# Patient Record
Sex: Female | Born: 1970
Health system: Southern US, Community
[De-identification: ages and names within clinical notes are randomized; demographics above are authoritative.]

## PROBLEM LIST (undated history)

## (undated) DIAGNOSIS — N200 Calculus of kidney: Secondary | ICD-10-CM

## (undated) DIAGNOSIS — I10 Essential (primary) hypertension: Secondary | ICD-10-CM

## (undated) DIAGNOSIS — N289 Disorder of kidney and ureter, unspecified: Secondary | ICD-10-CM

## (undated) DIAGNOSIS — K649 Unspecified hemorrhoids: Secondary | ICD-10-CM

## (undated) HISTORY — PX: TUBAL LIGATION: SHX77

## (undated) HISTORY — PX: CHOLECYSTECTOMY: SHX55

## (undated) HISTORY — DX: Unspecified hemorrhoids: K64.9

## (undated) HISTORY — PX: OTHER SURGICAL HISTORY: SHX169

---

## 1998-01-16 ENCOUNTER — Emergency Department (HOSPITAL_COMMUNITY): Admission: EM | Admit: 1998-01-16 | Discharge: 1998-01-16 | Payer: Self-pay | Admitting: Emergency Medicine

## 1998-01-28 ENCOUNTER — Emergency Department (HOSPITAL_COMMUNITY): Admission: EM | Admit: 1998-01-28 | Discharge: 1998-01-28 | Payer: Self-pay | Admitting: Emergency Medicine

## 1999-04-19 ENCOUNTER — Emergency Department (HOSPITAL_COMMUNITY): Admission: EM | Admit: 1999-04-19 | Discharge: 1999-04-19 | Payer: Self-pay | Admitting: Emergency Medicine

## 1999-06-27 ENCOUNTER — Encounter: Admission: RE | Admit: 1999-06-27 | Discharge: 1999-06-27 | Payer: Self-pay | Admitting: Family Medicine

## 1999-07-10 ENCOUNTER — Emergency Department (HOSPITAL_COMMUNITY): Admission: EM | Admit: 1999-07-10 | Discharge: 1999-07-10 | Payer: Self-pay | Admitting: Emergency Medicine

## 1999-07-12 ENCOUNTER — Emergency Department (HOSPITAL_COMMUNITY): Admission: EM | Admit: 1999-07-12 | Discharge: 1999-07-12 | Payer: Self-pay | Admitting: Emergency Medicine

## 1999-07-13 ENCOUNTER — Encounter: Admission: RE | Admit: 1999-07-13 | Discharge: 1999-07-13 | Payer: Self-pay | Admitting: Family Medicine

## 1999-07-14 ENCOUNTER — Emergency Department (HOSPITAL_COMMUNITY): Admission: EM | Admit: 1999-07-14 | Discharge: 1999-07-14 | Payer: Self-pay | Admitting: Emergency Medicine

## 2000-01-02 ENCOUNTER — Emergency Department (HOSPITAL_COMMUNITY): Admission: EM | Admit: 2000-01-02 | Discharge: 2000-01-03 | Payer: Self-pay | Admitting: Emergency Medicine

## 2000-01-23 ENCOUNTER — Encounter: Admission: RE | Admit: 2000-01-23 | Discharge: 2000-01-23 | Payer: Self-pay | Admitting: Family Medicine

## 2000-03-24 ENCOUNTER — Emergency Department (HOSPITAL_COMMUNITY): Admission: EM | Admit: 2000-03-24 | Discharge: 2000-03-24 | Payer: Self-pay | Admitting: Internal Medicine

## 2000-03-25 ENCOUNTER — Encounter: Admission: RE | Admit: 2000-03-25 | Discharge: 2000-03-25 | Payer: Self-pay | Admitting: Family Medicine

## 2000-06-16 ENCOUNTER — Encounter: Admission: RE | Admit: 2000-06-16 | Discharge: 2000-06-16 | Payer: Self-pay | Admitting: Family Medicine

## 2000-07-31 ENCOUNTER — Encounter: Admission: RE | Admit: 2000-07-31 | Discharge: 2000-07-31 | Payer: Self-pay | Admitting: Family Medicine

## 2000-10-15 ENCOUNTER — Encounter: Admission: RE | Admit: 2000-10-15 | Discharge: 2000-10-15 | Payer: Self-pay | Admitting: Family Medicine

## 2000-11-22 ENCOUNTER — Emergency Department (HOSPITAL_COMMUNITY): Admission: EM | Admit: 2000-11-22 | Discharge: 2000-11-22 | Payer: Self-pay | Admitting: Emergency Medicine

## 2001-01-07 ENCOUNTER — Emergency Department (HOSPITAL_COMMUNITY): Admission: EM | Admit: 2001-01-07 | Discharge: 2001-01-07 | Payer: Self-pay | Admitting: Emergency Medicine

## 2001-01-07 ENCOUNTER — Encounter: Payer: Self-pay | Admitting: Emergency Medicine

## 2001-01-12 ENCOUNTER — Emergency Department (HOSPITAL_COMMUNITY): Admission: EM | Admit: 2001-01-12 | Discharge: 2001-01-12 | Payer: Self-pay | Admitting: Emergency Medicine

## 2001-01-12 ENCOUNTER — Encounter: Payer: Self-pay | Admitting: Emergency Medicine

## 2001-02-17 ENCOUNTER — Encounter: Admission: RE | Admit: 2001-02-17 | Discharge: 2001-02-17 | Payer: Self-pay | Admitting: Family Medicine

## 2001-03-18 ENCOUNTER — Emergency Department (HOSPITAL_COMMUNITY): Admission: EM | Admit: 2001-03-18 | Discharge: 2001-03-18 | Payer: Self-pay | Admitting: Emergency Medicine

## 2001-04-06 ENCOUNTER — Encounter: Admission: RE | Admit: 2001-04-06 | Discharge: 2001-04-06 | Payer: Self-pay | Admitting: Family Medicine

## 2001-05-17 ENCOUNTER — Emergency Department (HOSPITAL_COMMUNITY): Admission: EM | Admit: 2001-05-17 | Discharge: 2001-05-17 | Payer: Self-pay | Admitting: Emergency Medicine

## 2001-05-24 ENCOUNTER — Emergency Department (HOSPITAL_COMMUNITY): Admission: EM | Admit: 2001-05-24 | Discharge: 2001-05-24 | Payer: Self-pay | Admitting: Emergency Medicine

## 2001-05-28 ENCOUNTER — Encounter: Admission: RE | Admit: 2001-05-28 | Discharge: 2001-05-28 | Payer: Self-pay | Admitting: Family Medicine

## 2001-07-02 ENCOUNTER — Other Ambulatory Visit: Admission: RE | Admit: 2001-07-02 | Discharge: 2001-07-02 | Payer: Self-pay | Admitting: Obstetrics and Gynecology

## 2001-09-10 ENCOUNTER — Ambulatory Visit (HOSPITAL_COMMUNITY): Admission: RE | Admit: 2001-09-10 | Discharge: 2001-09-10 | Payer: Self-pay | Admitting: Obstetrics and Gynecology

## 2001-09-10 ENCOUNTER — Encounter: Payer: Self-pay | Admitting: Obstetrics and Gynecology

## 2001-09-17 ENCOUNTER — Encounter: Payer: Self-pay | Admitting: Obstetrics and Gynecology

## 2001-09-17 ENCOUNTER — Ambulatory Visit (HOSPITAL_COMMUNITY): Admission: RE | Admit: 2001-09-17 | Discharge: 2001-09-17 | Payer: Self-pay | Admitting: Obstetrics and Gynecology

## 2001-10-22 ENCOUNTER — Ambulatory Visit (HOSPITAL_COMMUNITY): Admission: RE | Admit: 2001-10-22 | Discharge: 2001-10-22 | Payer: Self-pay | Admitting: Obstetrics and Gynecology

## 2001-10-22 ENCOUNTER — Encounter: Payer: Self-pay | Admitting: Obstetrics and Gynecology

## 2001-12-01 ENCOUNTER — Inpatient Hospital Stay (HOSPITAL_COMMUNITY): Admission: AD | Admit: 2001-12-01 | Discharge: 2001-12-01 | Payer: Self-pay | Admitting: Obstetrics and Gynecology

## 2001-12-02 ENCOUNTER — Observation Stay (HOSPITAL_COMMUNITY): Admission: AD | Admit: 2001-12-02 | Discharge: 2001-12-03 | Payer: Self-pay | Admitting: Obstetrics and Gynecology

## 2001-12-15 ENCOUNTER — Inpatient Hospital Stay (HOSPITAL_COMMUNITY): Admission: AD | Admit: 2001-12-15 | Discharge: 2001-12-15 | Payer: Self-pay | Admitting: Obstetrics and Gynecology

## 2002-01-08 ENCOUNTER — Inpatient Hospital Stay (HOSPITAL_COMMUNITY): Admission: AD | Admit: 2002-01-08 | Discharge: 2002-01-08 | Payer: Self-pay | Admitting: Obstetrics and Gynecology

## 2002-01-14 ENCOUNTER — Inpatient Hospital Stay (HOSPITAL_COMMUNITY): Admission: AD | Admit: 2002-01-14 | Discharge: 2002-01-16 | Payer: Self-pay | Admitting: Obstetrics and Gynecology

## 2002-01-14 ENCOUNTER — Encounter (INDEPENDENT_AMBULATORY_CARE_PROVIDER_SITE_OTHER): Payer: Self-pay

## 2002-01-20 ENCOUNTER — Inpatient Hospital Stay (HOSPITAL_COMMUNITY): Admission: AD | Admit: 2002-01-20 | Discharge: 2002-01-21 | Payer: Self-pay | Admitting: Obstetrics and Gynecology

## 2002-04-24 ENCOUNTER — Emergency Department (HOSPITAL_COMMUNITY): Admission: EM | Admit: 2002-04-24 | Discharge: 2002-04-24 | Payer: Self-pay | Admitting: Emergency Medicine

## 2002-10-27 ENCOUNTER — Emergency Department (HOSPITAL_COMMUNITY): Admission: EM | Admit: 2002-10-27 | Discharge: 2002-10-27 | Payer: Self-pay | Admitting: Emergency Medicine

## 2002-12-10 ENCOUNTER — Emergency Department (HOSPITAL_COMMUNITY): Admission: AD | Admit: 2002-12-10 | Discharge: 2002-12-10 | Payer: Self-pay | Admitting: Family Medicine

## 2003-04-28 ENCOUNTER — Emergency Department (HOSPITAL_COMMUNITY): Admission: EM | Admit: 2003-04-28 | Discharge: 2003-04-28 | Payer: Self-pay | Admitting: Family Medicine

## 2003-09-30 ENCOUNTER — Emergency Department (HOSPITAL_COMMUNITY): Admission: EM | Admit: 2003-09-30 | Discharge: 2003-09-30 | Payer: Self-pay | Admitting: Emergency Medicine

## 2003-10-23 ENCOUNTER — Inpatient Hospital Stay (HOSPITAL_COMMUNITY): Admission: AD | Admit: 2003-10-23 | Discharge: 2003-10-24 | Payer: Self-pay | Admitting: Obstetrics and Gynecology

## 2003-11-29 ENCOUNTER — Emergency Department (HOSPITAL_COMMUNITY): Admission: EM | Admit: 2003-11-29 | Discharge: 2003-11-29 | Payer: Self-pay | Admitting: Family Medicine

## 2004-02-21 ENCOUNTER — Emergency Department (HOSPITAL_COMMUNITY): Admission: EM | Admit: 2004-02-21 | Discharge: 2004-02-21 | Payer: Self-pay | Admitting: Emergency Medicine

## 2004-02-22 ENCOUNTER — Emergency Department (HOSPITAL_COMMUNITY): Admission: EM | Admit: 2004-02-22 | Discharge: 2004-02-22 | Payer: Self-pay | Admitting: Family Medicine

## 2004-02-23 ENCOUNTER — Emergency Department (HOSPITAL_COMMUNITY): Admission: EM | Admit: 2004-02-23 | Discharge: 2004-02-24 | Payer: Self-pay | Admitting: Emergency Medicine

## 2004-03-14 ENCOUNTER — Ambulatory Visit: Payer: Self-pay | Admitting: Family Medicine

## 2004-12-07 ENCOUNTER — Emergency Department (HOSPITAL_COMMUNITY): Admission: EM | Admit: 2004-12-07 | Discharge: 2004-12-07 | Payer: Self-pay | Admitting: Family Medicine

## 2005-01-11 ENCOUNTER — Emergency Department (HOSPITAL_COMMUNITY): Admission: EM | Admit: 2005-01-11 | Discharge: 2005-01-11 | Payer: Self-pay | Admitting: Family Medicine

## 2005-01-15 ENCOUNTER — Ambulatory Visit: Payer: Self-pay | Admitting: Sports Medicine

## 2005-01-26 ENCOUNTER — Emergency Department (HOSPITAL_COMMUNITY): Admission: EM | Admit: 2005-01-26 | Discharge: 2005-01-26 | Payer: Self-pay | Admitting: Family Medicine

## 2005-02-19 ENCOUNTER — Ambulatory Visit: Payer: Self-pay | Admitting: Sports Medicine

## 2005-04-22 ENCOUNTER — Emergency Department (HOSPITAL_COMMUNITY): Admission: EM | Admit: 2005-04-22 | Discharge: 2005-04-22 | Payer: Self-pay | Admitting: Emergency Medicine

## 2005-10-29 ENCOUNTER — Ambulatory Visit: Payer: Self-pay | Admitting: Family Medicine

## 2006-03-06 DIAGNOSIS — E669 Obesity, unspecified: Secondary | ICD-10-CM | POA: Insufficient documentation

## 2006-04-21 ENCOUNTER — Telehealth: Payer: Self-pay | Admitting: *Deleted

## 2006-04-21 ENCOUNTER — Encounter: Payer: Self-pay | Admitting: Family Medicine

## 2006-04-21 ENCOUNTER — Ambulatory Visit: Payer: Self-pay | Admitting: Sports Medicine

## 2006-04-21 DIAGNOSIS — J309 Allergic rhinitis, unspecified: Secondary | ICD-10-CM | POA: Insufficient documentation

## 2006-04-29 ENCOUNTER — Ambulatory Visit (HOSPITAL_COMMUNITY): Admission: RE | Admit: 2006-04-29 | Discharge: 2006-04-29 | Payer: Self-pay | Admitting: Internal Medicine

## 2006-05-21 ENCOUNTER — Ambulatory Visit: Payer: Self-pay | Admitting: Family Medicine

## 2006-05-21 ENCOUNTER — Other Ambulatory Visit: Admission: RE | Admit: 2006-05-21 | Discharge: 2006-05-21 | Payer: Self-pay | Admitting: Family Medicine

## 2006-05-21 ENCOUNTER — Encounter (INDEPENDENT_AMBULATORY_CARE_PROVIDER_SITE_OTHER): Payer: Self-pay | Admitting: Family Medicine

## 2006-09-22 ENCOUNTER — Telehealth: Payer: Self-pay | Admitting: *Deleted

## 2006-09-23 ENCOUNTER — Ambulatory Visit: Payer: Self-pay | Admitting: Family Medicine

## 2006-10-15 ENCOUNTER — Ambulatory Visit: Payer: Self-pay | Admitting: Family Medicine

## 2006-11-25 ENCOUNTER — Telehealth (INDEPENDENT_AMBULATORY_CARE_PROVIDER_SITE_OTHER): Payer: Self-pay | Admitting: Family Medicine

## 2006-12-03 ENCOUNTER — Ambulatory Visit: Payer: Self-pay | Admitting: Family Medicine

## 2006-12-03 LAB — CONVERTED CEMR LAB: Rapid Strep: NEGATIVE

## 2006-12-20 ENCOUNTER — Ambulatory Visit (HOSPITAL_COMMUNITY): Admission: EM | Admit: 2006-12-20 | Discharge: 2006-12-20 | Payer: Self-pay | Admitting: *Deleted

## 2006-12-24 ENCOUNTER — Ambulatory Visit: Payer: Self-pay | Admitting: Family Medicine

## 2006-12-24 ENCOUNTER — Encounter (INDEPENDENT_AMBULATORY_CARE_PROVIDER_SITE_OTHER): Payer: Self-pay | Admitting: Family Medicine

## 2006-12-24 ENCOUNTER — Encounter: Payer: Self-pay | Admitting: *Deleted

## 2006-12-24 LAB — CONVERTED CEMR LAB: WBC, UA: >20 cells/hpf

## 2006-12-25 ENCOUNTER — Telehealth: Payer: Self-pay | Admitting: *Deleted

## 2007-01-09 ENCOUNTER — Encounter (INDEPENDENT_AMBULATORY_CARE_PROVIDER_SITE_OTHER): Payer: Self-pay | Admitting: Family Medicine

## 2007-01-13 ENCOUNTER — Ambulatory Visit (HOSPITAL_BASED_OUTPATIENT_CLINIC_OR_DEPARTMENT_OTHER): Admission: RE | Admit: 2007-01-13 | Discharge: 2007-01-13 | Payer: Self-pay | Admitting: Urology

## 2007-02-04 ENCOUNTER — Telehealth (INDEPENDENT_AMBULATORY_CARE_PROVIDER_SITE_OTHER): Payer: Self-pay | Admitting: Family Medicine

## 2007-02-06 ENCOUNTER — Encounter (INDEPENDENT_AMBULATORY_CARE_PROVIDER_SITE_OTHER): Payer: Self-pay | Admitting: Family Medicine

## 2007-02-19 ENCOUNTER — Ambulatory Visit: Payer: Self-pay | Admitting: Family Medicine

## 2007-03-06 ENCOUNTER — Telehealth: Payer: Self-pay | Admitting: *Deleted

## 2007-03-17 ENCOUNTER — Encounter: Payer: Self-pay | Admitting: *Deleted

## 2007-04-22 ENCOUNTER — Telehealth: Payer: Self-pay | Admitting: *Deleted

## 2007-04-23 ENCOUNTER — Ambulatory Visit: Payer: Self-pay | Admitting: Family Medicine

## 2007-04-24 ENCOUNTER — Emergency Department (HOSPITAL_COMMUNITY): Admission: EM | Admit: 2007-04-24 | Discharge: 2007-04-25 | Payer: Self-pay | Admitting: Emergency Medicine

## 2007-04-27 ENCOUNTER — Telehealth: Payer: Self-pay | Admitting: *Deleted

## 2007-04-28 ENCOUNTER — Encounter (INDEPENDENT_AMBULATORY_CARE_PROVIDER_SITE_OTHER): Payer: Self-pay | Admitting: Family Medicine

## 2007-04-29 ENCOUNTER — Ambulatory Visit: Payer: Self-pay | Admitting: Family Medicine

## 2007-04-29 ENCOUNTER — Encounter (INDEPENDENT_AMBULATORY_CARE_PROVIDER_SITE_OTHER): Payer: Self-pay | Admitting: Family Medicine

## 2007-05-05 ENCOUNTER — Encounter (INDEPENDENT_AMBULATORY_CARE_PROVIDER_SITE_OTHER): Payer: Self-pay | Admitting: Neurology

## 2007-05-05 ENCOUNTER — Ambulatory Visit (HOSPITAL_COMMUNITY): Admission: RE | Admit: 2007-05-05 | Discharge: 2007-05-05 | Payer: Self-pay | Admitting: Family Medicine

## 2007-05-12 ENCOUNTER — Telehealth (INDEPENDENT_AMBULATORY_CARE_PROVIDER_SITE_OTHER): Payer: Self-pay | Admitting: Family Medicine

## 2007-05-28 ENCOUNTER — Telehealth (INDEPENDENT_AMBULATORY_CARE_PROVIDER_SITE_OTHER): Payer: Self-pay | Admitting: Family Medicine

## 2007-06-22 ENCOUNTER — Telehealth: Payer: Self-pay | Admitting: *Deleted

## 2007-06-23 ENCOUNTER — Ambulatory Visit: Payer: Self-pay | Admitting: Family Medicine

## 2007-07-06 ENCOUNTER — Ambulatory Visit: Payer: Self-pay | Admitting: Family Medicine

## 2007-07-06 ENCOUNTER — Telehealth: Payer: Self-pay | Admitting: *Deleted

## 2007-07-06 ENCOUNTER — Encounter: Payer: Self-pay | Admitting: Family Medicine

## 2007-07-09 ENCOUNTER — Encounter: Admission: RE | Admit: 2007-07-09 | Discharge: 2007-07-09 | Payer: Self-pay | Admitting: Family Medicine

## 2007-07-12 ENCOUNTER — Inpatient Hospital Stay (HOSPITAL_COMMUNITY): Admission: AD | Admit: 2007-07-12 | Discharge: 2007-07-12 | Payer: Self-pay | Admitting: Gynecology

## 2007-07-24 ENCOUNTER — Ambulatory Visit: Payer: Self-pay | Admitting: Family Medicine

## 2007-07-24 ENCOUNTER — Ambulatory Visit (HOSPITAL_COMMUNITY): Admission: RE | Admit: 2007-07-24 | Discharge: 2007-07-24 | Payer: Self-pay | Admitting: Family Medicine

## 2007-07-24 ENCOUNTER — Encounter: Payer: Self-pay | Admitting: Family Medicine

## 2007-07-24 DIAGNOSIS — N951 Menopausal and female climacteric states: Secondary | ICD-10-CM | POA: Insufficient documentation

## 2007-07-27 ENCOUNTER — Ambulatory Visit: Payer: Self-pay | Admitting: Family Medicine

## 2007-07-27 ENCOUNTER — Encounter: Payer: Self-pay | Admitting: Family Medicine

## 2007-07-27 LAB — CONVERTED CEMR LAB
BUN: 11 mg/dL (ref 6–23)
Blood Glucose, AC Bkfst: 324 mg/dL
CO2: 22 meq/L (ref 19–32)
Calcium: 8.7 mg/dL (ref 8.4–10.5)
Creatinine, Ser: 0.65 mg/dL (ref 0.40–1.20)
HCT: 39.7 % (ref 36.0–46.0)
Hemoglobin: 13.2 g/dL (ref 12.0–15.0)
Hgb A1c MFr Bld: 11.2 %
MCV: 80.7 fL (ref 78.0–100.0)
Platelets: 385 10*3/uL (ref 150–400)
RDW: 14.8 % (ref 11.5–15.5)

## 2007-07-28 LAB — CONVERTED CEMR LAB
LDL Cholesterol: 100 mg/dL — ABNORMAL HIGH (ref 0–99)
Total CHOL/HDL Ratio: 3.8

## 2007-07-29 ENCOUNTER — Ambulatory Visit: Payer: Self-pay | Admitting: Family Medicine

## 2007-07-29 ENCOUNTER — Telehealth: Payer: Self-pay | Admitting: Family Medicine

## 2007-07-29 LAB — CONVERTED CEMR LAB: Blood Glucose, Fingerstick: 267

## 2007-07-30 ENCOUNTER — Telehealth: Payer: Self-pay | Admitting: *Deleted

## 2007-07-30 ENCOUNTER — Encounter: Payer: Self-pay | Admitting: Family Medicine

## 2007-07-31 ENCOUNTER — Ambulatory Visit: Payer: Self-pay | Admitting: Family Medicine

## 2007-07-31 LAB — CONVERTED CEMR LAB: Blood Glucose, Fingerstick: 396

## 2007-08-03 ENCOUNTER — Ambulatory Visit: Payer: Self-pay | Admitting: Family Medicine

## 2007-08-03 ENCOUNTER — Encounter: Payer: Self-pay | Admitting: *Deleted

## 2007-08-03 ENCOUNTER — Encounter: Admission: RE | Admit: 2007-08-03 | Discharge: 2007-09-29 | Payer: Self-pay | Admitting: Family Medicine

## 2007-08-03 ENCOUNTER — Encounter: Payer: Self-pay | Admitting: Family Medicine

## 2007-08-04 ENCOUNTER — Telehealth: Payer: Self-pay | Admitting: *Deleted

## 2007-08-11 ENCOUNTER — Ambulatory Visit: Payer: Self-pay | Admitting: Family Medicine

## 2007-08-11 DIAGNOSIS — F319 Bipolar disorder, unspecified: Secondary | ICD-10-CM | POA: Insufficient documentation

## 2007-08-11 DIAGNOSIS — G4700 Insomnia, unspecified: Secondary | ICD-10-CM | POA: Insufficient documentation

## 2007-08-26 ENCOUNTER — Encounter (INDEPENDENT_AMBULATORY_CARE_PROVIDER_SITE_OTHER): Payer: Self-pay | Admitting: *Deleted

## 2007-08-31 ENCOUNTER — Ambulatory Visit: Payer: Self-pay | Admitting: Sports Medicine

## 2007-09-04 ENCOUNTER — Telehealth (INDEPENDENT_AMBULATORY_CARE_PROVIDER_SITE_OTHER): Payer: Self-pay | Admitting: Family Medicine

## 2007-09-04 ENCOUNTER — Emergency Department (HOSPITAL_COMMUNITY): Admission: EM | Admit: 2007-09-04 | Discharge: 2007-09-05 | Payer: Self-pay | Admitting: Family Medicine

## 2007-09-18 ENCOUNTER — Encounter: Payer: Self-pay | Admitting: Family Medicine

## 2007-09-21 ENCOUNTER — Telehealth: Payer: Self-pay | Admitting: *Deleted

## 2007-09-22 ENCOUNTER — Ambulatory Visit: Payer: Self-pay | Admitting: Family Medicine

## 2007-10-02 ENCOUNTER — Ambulatory Visit: Payer: Self-pay | Admitting: Family Medicine

## 2007-10-02 DIAGNOSIS — E785 Hyperlipidemia, unspecified: Secondary | ICD-10-CM | POA: Insufficient documentation

## 2007-10-03 ENCOUNTER — Encounter: Payer: Self-pay | Admitting: Family Medicine

## 2007-10-03 LAB — CONVERTED CEMR LAB
ALT: 11 units/L (ref 0–35)
Albumin: 3.9 g/dL (ref 3.5–5.2)
Alkaline Phosphatase: 89 units/L (ref 39–117)
Total Protein: 7.1 g/dL (ref 6.0–8.3)

## 2007-10-15 ENCOUNTER — Encounter: Payer: Self-pay | Admitting: Family Medicine

## 2007-10-28 ENCOUNTER — Telehealth: Payer: Self-pay | Admitting: Family Medicine

## 2007-10-29 ENCOUNTER — Emergency Department (HOSPITAL_COMMUNITY): Admission: EM | Admit: 2007-10-29 | Discharge: 2007-10-30 | Payer: Self-pay | Admitting: Emergency Medicine

## 2007-10-29 ENCOUNTER — Telehealth: Payer: Self-pay | Admitting: *Deleted

## 2007-11-12 ENCOUNTER — Telehealth: Payer: Self-pay | Admitting: Family Medicine

## 2007-11-12 ENCOUNTER — Ambulatory Visit: Payer: Self-pay | Admitting: Family Medicine

## 2007-11-12 DIAGNOSIS — I10 Essential (primary) hypertension: Secondary | ICD-10-CM | POA: Insufficient documentation

## 2007-11-12 DIAGNOSIS — H409 Unspecified glaucoma: Secondary | ICD-10-CM | POA: Insufficient documentation

## 2007-11-19 ENCOUNTER — Telehealth: Payer: Self-pay | Admitting: *Deleted

## 2007-12-01 ENCOUNTER — Telehealth (INDEPENDENT_AMBULATORY_CARE_PROVIDER_SITE_OTHER): Payer: Self-pay | Admitting: *Deleted

## 2007-12-15 ENCOUNTER — Encounter (INDEPENDENT_AMBULATORY_CARE_PROVIDER_SITE_OTHER): Payer: Self-pay | Admitting: *Deleted

## 2007-12-21 ENCOUNTER — Encounter: Payer: Self-pay | Admitting: *Deleted

## 2008-01-12 ENCOUNTER — Encounter: Payer: Self-pay | Admitting: Family Medicine

## 2008-01-12 ENCOUNTER — Emergency Department (HOSPITAL_COMMUNITY): Admission: EM | Admit: 2008-01-12 | Discharge: 2008-01-12 | Payer: Self-pay | Admitting: Emergency Medicine

## 2008-01-12 ENCOUNTER — Ambulatory Visit (HOSPITAL_COMMUNITY): Admission: RE | Admit: 2008-01-12 | Discharge: 2008-01-12 | Payer: Self-pay | Admitting: Family Medicine

## 2008-01-13 ENCOUNTER — Ambulatory Visit: Payer: Self-pay | Admitting: Family Medicine

## 2008-01-18 ENCOUNTER — Telehealth (INDEPENDENT_AMBULATORY_CARE_PROVIDER_SITE_OTHER): Payer: Self-pay | Admitting: *Deleted

## 2008-01-19 ENCOUNTER — Ambulatory Visit: Payer: Self-pay

## 2008-01-19 ENCOUNTER — Encounter: Payer: Self-pay | Admitting: Family Medicine

## 2008-01-20 ENCOUNTER — Telehealth (INDEPENDENT_AMBULATORY_CARE_PROVIDER_SITE_OTHER): Payer: Self-pay | Admitting: *Deleted

## 2008-01-25 ENCOUNTER — Encounter: Payer: Self-pay | Admitting: *Deleted

## 2008-01-26 ENCOUNTER — Ambulatory Visit: Payer: Self-pay | Admitting: Family Medicine

## 2008-02-04 ENCOUNTER — Telehealth: Payer: Self-pay | Admitting: Family Medicine

## 2008-03-22 ENCOUNTER — Encounter: Payer: Self-pay | Admitting: *Deleted

## 2008-03-22 ENCOUNTER — Ambulatory Visit: Payer: Self-pay | Admitting: Family Medicine

## 2008-04-08 ENCOUNTER — Ambulatory Visit: Payer: Self-pay | Admitting: Family Medicine

## 2008-04-19 ENCOUNTER — Telehealth: Payer: Self-pay | Admitting: Family Medicine

## 2008-04-21 ENCOUNTER — Ambulatory Visit: Payer: Self-pay | Admitting: Family Medicine

## 2008-04-21 ENCOUNTER — Encounter: Payer: Self-pay | Admitting: Family Medicine

## 2008-04-21 LAB — CONVERTED CEMR LAB
HCT: 36.9 % (ref 36.0–46.0)
Hemoglobin: 12.2 g/dL (ref 12.0–15.0)
MCHC: 33.1 g/dL (ref 30.0–36.0)
MCV: 81.1 fL (ref 78.0–100.0)
RBC: 4.55 M/uL (ref 3.87–5.11)
RDW: 14.7 % (ref 11.5–15.5)

## 2008-04-22 ENCOUNTER — Encounter: Payer: Self-pay | Admitting: Family Medicine

## 2008-05-10 ENCOUNTER — Ambulatory Visit (HOSPITAL_COMMUNITY): Admission: RE | Admit: 2008-05-10 | Discharge: 2008-05-10 | Payer: Self-pay | Admitting: Family Medicine

## 2008-05-11 ENCOUNTER — Ambulatory Visit: Payer: Self-pay | Admitting: Family Medicine

## 2008-05-11 ENCOUNTER — Encounter: Payer: Self-pay | Admitting: Family Medicine

## 2008-05-11 DIAGNOSIS — D259 Leiomyoma of uterus, unspecified: Secondary | ICD-10-CM | POA: Insufficient documentation

## 2008-05-11 DIAGNOSIS — N920 Excessive and frequent menstruation with regular cycle: Secondary | ICD-10-CM | POA: Insufficient documentation

## 2008-05-23 ENCOUNTER — Telehealth: Payer: Self-pay | Admitting: Family Medicine

## 2008-05-23 ENCOUNTER — Encounter: Payer: Self-pay | Admitting: Family Medicine

## 2008-06-09 ENCOUNTER — Telehealth: Payer: Self-pay | Admitting: *Deleted

## 2008-06-20 ENCOUNTER — Telehealth: Payer: Self-pay | Admitting: Family Medicine

## 2008-06-30 ENCOUNTER — Encounter: Payer: Self-pay | Admitting: Family Medicine

## 2008-07-01 ENCOUNTER — Encounter: Payer: Self-pay | Admitting: Family Medicine

## 2008-07-01 ENCOUNTER — Ambulatory Visit: Payer: Self-pay | Admitting: Family Medicine

## 2008-07-07 ENCOUNTER — Ambulatory Visit: Payer: Self-pay | Admitting: Family Medicine

## 2008-07-28 ENCOUNTER — Encounter: Payer: Self-pay | Admitting: Family Medicine

## 2008-07-29 ENCOUNTER — Ambulatory Visit: Payer: Self-pay | Admitting: Family Medicine

## 2008-07-29 ENCOUNTER — Encounter: Payer: Self-pay | Admitting: Family Medicine

## 2008-07-29 DIAGNOSIS — E1165 Type 2 diabetes mellitus with hyperglycemia: Secondary | ICD-10-CM | POA: Insufficient documentation

## 2008-07-29 DIAGNOSIS — E119 Type 2 diabetes mellitus without complications: Secondary | ICD-10-CM | POA: Insufficient documentation

## 2008-07-29 DIAGNOSIS — E118 Type 2 diabetes mellitus with unspecified complications: Secondary | ICD-10-CM

## 2008-08-03 LAB — CONVERTED CEMR LAB
AST: 15 units/L (ref 0–37)
Alkaline Phosphatase: 74 units/L (ref 39–117)
Glucose, Bld: 103 mg/dL — ABNORMAL HIGH (ref 70–99)
LDL Cholesterol: 114 mg/dL — ABNORMAL HIGH (ref 0–99)
MCHC: 32.9 g/dL (ref 30.0–36.0)
MCV: 83 fL (ref 78.0–100.0)
Platelets: 357 10*3/uL (ref 150–400)
Potassium: 4.5 meq/L (ref 3.5–5.3)
RBC: 4.76 M/uL (ref 3.87–5.11)
RDW: 14.8 % (ref 11.5–15.5)
Sodium: 140 meq/L (ref 135–145)
Total Bilirubin: 0.2 mg/dL — ABNORMAL LOW (ref 0.3–1.2)
Total Protein: 6.8 g/dL (ref 6.0–8.3)
Triglycerides: 105 mg/dL (ref <150)
VLDL: 21 mg/dL (ref 0–40)

## 2008-08-05 ENCOUNTER — Telehealth: Payer: Self-pay | Admitting: Family Medicine

## 2008-08-08 ENCOUNTER — Telehealth: Payer: Self-pay | Admitting: Family Medicine

## 2008-08-09 ENCOUNTER — Encounter: Payer: Self-pay | Admitting: Family Medicine

## 2008-08-09 ENCOUNTER — Ambulatory Visit: Payer: Self-pay | Admitting: Family Medicine

## 2008-08-09 ENCOUNTER — Telehealth: Payer: Self-pay | Admitting: Family Medicine

## 2008-09-19 ENCOUNTER — Ambulatory Visit: Payer: Self-pay | Admitting: Family Medicine

## 2008-09-23 ENCOUNTER — Emergency Department (HOSPITAL_COMMUNITY): Admission: EM | Admit: 2008-09-23 | Discharge: 2008-09-23 | Payer: Self-pay | Admitting: Family Medicine

## 2008-11-05 ENCOUNTER — Emergency Department (HOSPITAL_COMMUNITY): Admission: EM | Admit: 2008-11-05 | Discharge: 2008-11-05 | Payer: Self-pay | Admitting: Emergency Medicine

## 2008-11-21 ENCOUNTER — Ambulatory Visit: Payer: Self-pay | Admitting: Family Medicine

## 2008-11-22 ENCOUNTER — Encounter: Payer: Self-pay | Admitting: Family Medicine

## 2008-11-25 ENCOUNTER — Telehealth: Payer: Self-pay | Admitting: Family Medicine

## 2008-11-25 ENCOUNTER — Ambulatory Visit: Payer: Self-pay | Admitting: Family Medicine

## 2008-11-26 ENCOUNTER — Emergency Department (HOSPITAL_COMMUNITY): Admission: EM | Admit: 2008-11-26 | Discharge: 2008-11-26 | Payer: Self-pay | Admitting: Emergency Medicine

## 2008-11-27 ENCOUNTER — Emergency Department (HOSPITAL_COMMUNITY): Admission: EM | Admit: 2008-11-27 | Discharge: 2008-11-28 | Payer: Self-pay | Admitting: Emergency Medicine

## 2008-11-30 ENCOUNTER — Telehealth: Payer: Self-pay | Admitting: Family Medicine

## 2009-01-03 ENCOUNTER — Encounter: Payer: Self-pay | Admitting: Family Medicine

## 2009-01-11 ENCOUNTER — Telehealth: Payer: Self-pay | Admitting: Family Medicine

## 2009-01-11 ENCOUNTER — Ambulatory Visit: Payer: Self-pay | Admitting: Family Medicine

## 2009-04-05 ENCOUNTER — Encounter: Payer: Self-pay | Admitting: Family Medicine

## 2009-04-05 ENCOUNTER — Telehealth: Payer: Self-pay | Admitting: Family Medicine

## 2009-07-03 ENCOUNTER — Telehealth: Payer: Self-pay | Admitting: Psychology

## 2009-07-07 ENCOUNTER — Ambulatory Visit: Payer: Self-pay | Admitting: Family Medicine

## 2009-07-07 LAB — CONVERTED CEMR LAB: Hgb A1c MFr Bld: 7.6 %

## 2009-07-08 ENCOUNTER — Telehealth: Payer: Self-pay | Admitting: Family Medicine

## 2009-07-12 ENCOUNTER — Telehealth (INDEPENDENT_AMBULATORY_CARE_PROVIDER_SITE_OTHER): Payer: Self-pay | Admitting: *Deleted

## 2009-07-13 ENCOUNTER — Ambulatory Visit: Payer: Self-pay | Admitting: Family Medicine

## 2009-07-13 ENCOUNTER — Telehealth: Payer: Self-pay | Admitting: Family Medicine

## 2009-07-17 ENCOUNTER — Encounter: Payer: Self-pay | Admitting: Family Medicine

## 2009-07-17 ENCOUNTER — Ambulatory Visit: Payer: Self-pay | Admitting: Family Medicine

## 2009-07-17 ENCOUNTER — Telehealth: Payer: Self-pay | Admitting: Family Medicine

## 2009-07-17 LAB — CONVERTED CEMR LAB
ALT: <8 units/L (ref 0–35)
AST: 13 units/L (ref 0–37)
Basophils Absolute: 0 10*3/uL (ref 0.0–0.1)
Basophils Relative: 1 % (ref 0–1)
Creatinine, Ser: 0.86 mg/dL (ref 0.40–1.20)
Creatinine,U: 200 mg/dL
Eosinophils Relative: 2 % (ref 0–5)
HCT: 43.2 % (ref 36.0–46.0)
Hemoglobin: 14.1 g/dL (ref 12.0–15.0)
MCHC: 32.6 g/dL (ref 30.0–36.0)
Monocytes Absolute: 0.5 10*3/uL (ref 0.1–1.0)
Neutro Abs: 2.4 10*3/uL (ref 1.7–7.7)
Platelets: 419 10*3/uL — ABNORMAL HIGH (ref 150–400)
RDW: 15.2 % (ref 11.5–15.5)
Total Bilirubin: 0.3 mg/dL (ref 0.3–1.2)

## 2009-07-20 ENCOUNTER — Telehealth: Payer: Self-pay | Admitting: Family Medicine

## 2009-07-20 ENCOUNTER — Ambulatory Visit: Payer: Self-pay | Admitting: Family Medicine

## 2009-07-21 ENCOUNTER — Telehealth: Payer: Self-pay | Admitting: Family Medicine

## 2009-07-24 ENCOUNTER — Ambulatory Visit: Payer: Self-pay | Admitting: Family Medicine

## 2009-07-31 ENCOUNTER — Ambulatory Visit: Payer: Self-pay | Admitting: Psychology

## 2009-07-31 ENCOUNTER — Encounter: Payer: Self-pay | Admitting: Family Medicine

## 2009-07-31 DIAGNOSIS — F4310 Post-traumatic stress disorder, unspecified: Secondary | ICD-10-CM | POA: Insufficient documentation

## 2009-08-07 ENCOUNTER — Ambulatory Visit: Payer: Self-pay | Admitting: Psychology

## 2009-08-15 ENCOUNTER — Ambulatory Visit: Payer: Self-pay | Admitting: Psychology

## 2009-08-16 ENCOUNTER — Telehealth: Payer: Self-pay | Admitting: Family Medicine

## 2009-08-16 ENCOUNTER — Ambulatory Visit: Payer: Self-pay | Admitting: Family Medicine

## 2009-08-16 DIAGNOSIS — R109 Unspecified abdominal pain: Secondary | ICD-10-CM | POA: Insufficient documentation

## 2009-08-16 LAB — CONVERTED CEMR LAB
Beta hcg, urine, semiquantitative: NEGATIVE
Bilirubin Urine: NEGATIVE
Ketones, urine, test strip: NEGATIVE
Protein, U semiquant: 30
Urobilinogen, UA: 1

## 2009-08-17 ENCOUNTER — Encounter: Payer: Self-pay | Admitting: Family Medicine

## 2009-08-31 ENCOUNTER — Ambulatory Visit: Payer: Self-pay | Admitting: Psychology

## 2009-08-31 ENCOUNTER — Other Ambulatory Visit: Admission: RE | Admit: 2009-08-31 | Discharge: 2009-08-31 | Payer: Self-pay | Admitting: Obstetrics and Gynecology

## 2009-08-31 ENCOUNTER — Ambulatory Visit: Payer: Self-pay | Admitting: Obstetrics and Gynecology

## 2009-08-31 ENCOUNTER — Encounter: Payer: Self-pay | Admitting: Family Medicine

## 2009-08-31 LAB — CONVERTED CEMR LAB

## 2009-09-07 ENCOUNTER — Encounter: Payer: Self-pay | Admitting: Family Medicine

## 2009-09-07 ENCOUNTER — Ambulatory Visit (HOSPITAL_COMMUNITY): Admission: RE | Admit: 2009-09-07 | Discharge: 2009-09-07 | Payer: Self-pay | Admitting: Obstetrics & Gynecology

## 2009-09-07 ENCOUNTER — Ambulatory Visit: Payer: Self-pay | Admitting: Family Medicine

## 2009-09-14 ENCOUNTER — Ambulatory Visit: Payer: Self-pay | Admitting: Psychology

## 2009-09-18 ENCOUNTER — Telehealth: Payer: Self-pay | Admitting: Family Medicine

## 2009-09-18 ENCOUNTER — Emergency Department (HOSPITAL_COMMUNITY): Admission: EM | Admit: 2009-09-18 | Discharge: 2009-09-18 | Payer: Self-pay | Admitting: Emergency Medicine

## 2009-09-21 ENCOUNTER — Encounter: Payer: Self-pay | Admitting: Family Medicine

## 2009-09-21 ENCOUNTER — Ambulatory Visit: Payer: Self-pay | Admitting: Obstetrics and Gynecology

## 2009-09-28 ENCOUNTER — Telehealth: Payer: Self-pay | Admitting: Family Medicine

## 2009-09-28 ENCOUNTER — Inpatient Hospital Stay (HOSPITAL_COMMUNITY): Admission: AD | Admit: 2009-09-28 | Discharge: 2009-09-28 | Payer: Self-pay | Admitting: Obstetrics and Gynecology

## 2009-09-28 ENCOUNTER — Ambulatory Visit: Payer: Self-pay | Admitting: Physician Assistant

## 2009-10-09 ENCOUNTER — Telehealth: Payer: Self-pay | Admitting: Psychology

## 2009-11-02 ENCOUNTER — Ambulatory Visit: Payer: Self-pay | Admitting: Family Medicine

## 2009-11-02 DIAGNOSIS — Z91010 Allergy to peanuts: Secondary | ICD-10-CM | POA: Insufficient documentation

## 2009-11-04 ENCOUNTER — Emergency Department (HOSPITAL_COMMUNITY): Admission: EM | Admit: 2009-11-04 | Discharge: 2009-11-04 | Payer: Self-pay | Admitting: Family Medicine

## 2009-11-10 ENCOUNTER — Ambulatory Visit: Payer: Self-pay | Admitting: Obstetrics & Gynecology

## 2009-11-10 ENCOUNTER — Ambulatory Visit (HOSPITAL_COMMUNITY): Admission: RE | Admit: 2009-11-10 | Discharge: 2009-11-10 | Payer: Self-pay | Admitting: Internal Medicine

## 2009-11-20 ENCOUNTER — Ambulatory Visit: Payer: Self-pay | Admitting: Family Medicine

## 2009-12-11 ENCOUNTER — Ambulatory Visit: Payer: Self-pay | Admitting: Family Medicine

## 2009-12-11 DIAGNOSIS — R404 Transient alteration of awareness: Secondary | ICD-10-CM | POA: Insufficient documentation

## 2009-12-14 ENCOUNTER — Ambulatory Visit: Payer: Self-pay | Admitting: Obstetrics & Gynecology

## 2009-12-15 ENCOUNTER — Ambulatory Visit: Payer: Self-pay | Admitting: Family Medicine

## 2009-12-15 LAB — CONVERTED CEMR LAB
Bilirubin Urine: NEGATIVE
Urobilinogen, UA: 0.2

## 2009-12-21 ENCOUNTER — Telehealth (INDEPENDENT_AMBULATORY_CARE_PROVIDER_SITE_OTHER): Payer: Self-pay | Admitting: *Deleted

## 2009-12-21 ENCOUNTER — Encounter: Payer: Self-pay | Admitting: Family Medicine

## 2009-12-21 ENCOUNTER — Ambulatory Visit: Payer: Self-pay | Admitting: Family Medicine

## 2009-12-25 ENCOUNTER — Encounter
Admission: RE | Admit: 2009-12-25 | Discharge: 2010-02-06 | Payer: Self-pay | Source: Home / Self Care | Attending: Family Medicine | Admitting: Family Medicine

## 2009-12-25 ENCOUNTER — Telehealth: Payer: Self-pay | Admitting: *Deleted

## 2009-12-26 ENCOUNTER — Encounter: Payer: Self-pay | Admitting: Family Medicine

## 2009-12-26 ENCOUNTER — Ambulatory Visit: Payer: Self-pay | Admitting: Family Medicine

## 2009-12-27 ENCOUNTER — Telehealth: Payer: Self-pay | Admitting: Family Medicine

## 2010-01-04 ENCOUNTER — Ambulatory Visit (HOSPITAL_BASED_OUTPATIENT_CLINIC_OR_DEPARTMENT_OTHER)
Admission: RE | Admit: 2010-01-04 | Discharge: 2010-01-04 | Payer: Self-pay | Source: Home / Self Care | Attending: Family Medicine | Admitting: Family Medicine

## 2010-01-04 ENCOUNTER — Encounter: Payer: Self-pay | Admitting: Family Medicine

## 2010-01-09 ENCOUNTER — Telehealth: Payer: Self-pay | Admitting: Family Medicine

## 2010-01-23 ENCOUNTER — Ambulatory Visit
Admission: RE | Admit: 2010-01-23 | Discharge: 2010-01-23 | Payer: Self-pay | Source: Home / Self Care | Attending: Family Medicine | Admitting: Family Medicine

## 2010-01-23 DIAGNOSIS — E1142 Type 2 diabetes mellitus with diabetic polyneuropathy: Secondary | ICD-10-CM | POA: Insufficient documentation

## 2010-01-29 ENCOUNTER — Telehealth: Payer: Self-pay | Admitting: *Deleted

## 2010-01-30 ENCOUNTER — Ambulatory Visit: Admit: 2010-01-30 | Payer: Self-pay

## 2010-02-06 ENCOUNTER — Telehealth: Payer: Self-pay | Admitting: Family Medicine

## 2010-02-08 NOTE — Progress Notes (Signed)
Summary: Triage  Phone Note Call from Patient Call back at Home Phone [phone]   Reason for Call: Talk to Nurse Summary of Call: pt sts her merena "fell out" & the reason she had it was to help with her menstral cramps, pt needs something asap Initial call taken by: Knox Royalty,  July 20, 2009 8:31 AM  Follow-up for Phone Call        states it fell out last night.  heavy bleeding & cramping. no longer has any diclofenac. advised ibu for the cramping. work in at 1:30. she is unable to come sooner as she has an appt at 38 & is not sure how long that will take. asked her to call me if she finds she is getting out sooner than expected. may be able to be seen in women;s clinic Follow-up by: Golden Circle RN,  July 20, 2009 8:50 AM  Additional Follow-up for Phone Call Additional follow up Details #1::        she just called & says she can be here for the 11:30 women's health clinic. changed to that time Additional Follow-up by: Golden Circle RN,  July 20, 2009 11:03 AM

## 2010-02-08 NOTE — Progress Notes (Signed)
Summary: Rx Prob  Phone Note Call from Patient Call back at [facility] Phone [phone]   Caller: Patient Summary of Call: The Wellbutrin is causing her to not eat or sleep since doctor increased it. Initial call taken by: Clydell Hakim,  July 12, 2009 9:45 AM  Follow-up for Phone Call        spoke with  patient and she has continued taking the 400 mg of wellbutrin daily . she did not go back to previous dose as suggested by Dr. Katrinka Blazing on 07/09/2009. appointment scheduled tomorrow with Dr. Earnest Bailey. advised her to go back to previous dose as recommended by Dr. Katrinka Blazing until she can see Dr. Earnest Bailey tomorrow. Follow-up by: Theresia Lo RN,  July 12, 2009 11:35 AM

## 2010-02-08 NOTE — Assessment & Plan Note (Signed)
Summary: pain/[place]   Vital Signs:  Patient profile:   40 year old female Height:      60.25 inches Weight:      220 pounds BMI:     42.76 BSA:     1.95 Temp:     98.7 degrees F Pulse rate:   74 / minute BP sitting:   136 / 95  Vitals Entered By: Jone Baseman CMA (August 16, 2009 11:04 AM) CC: pain Is Patient Diabetic? No Pain Assessment Patient in pain? yes     Location: all over Intensity: 8   Primary Care Provider:  Delbert Harness MD  CC:  pain.  History of Present Illness: CC: pelvic and leg pain  HPI:  Patient presents with several day history of abdominal and leg pain.  Has history of menorrhagia and irregular periods, states pain started on Sunday when she began her menses.   Describes as dull, cramping pain that begins in her lower abdomen and pelvis and radiates to thighs. Throughout thighs.  Has had similar pain in past with this along with excessive bleeding.  Has attempted Provera without help for bleeding, Meloxicam for abdominal pain with inconsistent relief.  Patient wants something to help with pain.    ROS:  no headaches, pre-syncopal or syncopal episodes, chest pain, palpitations, shortness of breath or dyspnea,  diarrhea or constipation, melena, hematochezia, lower extremity swelling, dysuria.   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) 6)  Cervical Lymphadenopathy, Left  (ICD-785.6) 7)  Diabetes Mellitus, Type II, Controlled  (ICD-250.00) 8)  Contraceptive Management  (ICD-V25.09) 9)  Fibroids, Uterus  (ICD-218.9) 10)  Menorrhagia  (ICD-626.2) 11)  Glaucoma  (ICD-365.9) 12)  Hypertension, Benign Essential  (ICD-401.1) 13)  Dyslipidemia  (ICD-272.4) 14)  Sx of Fatigue   (ICD-780.79) 15)  Insomnia  (ICD-780.52) 16)  Bipolar Disorder Unspecified  (ICD-296.80) 17)  Well Woman  (ICD-V70.0) 18)  Screening For Malignant Neoplasm of The Cervix  (ICD-V76.2) 19)  Hot Flashes  (ICD-627.2) 20)  Dyspnea  (ICD-786.05) 21)  Irregular Menstrual Cycle  (ICD-626.4) 22)  Gynecological Examination, Routine  (ICD-V72.31) 23)  Allergic Rhinitis  (ICD-477.9) 24)  Obesity, Nos  (ICD-278.00)  Current Medications (verified): 1)  Fluticasone Propionate 50 Mcg/act Susp (Fluticasone Propionate) .... 2 Sprays in Each Nostril Daily 2)  B-4 Med Compression Hose Mens   Misc (Elastic Bandages & Supports) .... Medium Size With 30-73mm Hg of Support To Wear Every Day.  Knee Highs Please.  1 Pair 3)  Lantus Solostar 100 Unit/ml Soln (Insulin Glargine) .... Inject 45 Units Subcutaneously Q Am. Dispense One Box; Increase As Directed By Your Doctor, Qs 4)  Aspirin Adult Low Strength 81 Mg Tbec (Aspirin) .... Take 1 Tab By Mouth Daily 5)  Singulair 10 Mg Tabs (Montelukast Sodium) .... One By Mouth Daily 6)  Lisinopril 10 Mg Tabs (Lisinopril) .Marland Kitchen.. 1 Tab By Mouth Daily For Blood Pressure 7)  Zocor 40 Mg Tabs (Simvastatin) .Marland Kitchen.. 1 Tab By Mouth Daily For Cholesterol 8)  Depakote 250 Mg Tbec (Divalproex Sodium) .... Take One Tablet Twice A Day For 3 Days, Then Increased To 2 Tablets Twice A Day 9)  Provera 10 Mg Tabs (Medroxyprogesterone Acetate) .Marland Kitchen.. 1 By Mouth Once Daily On  Days 14-19 Each Month To Control Heavy Menses 10)  Meloxicam 15 Mg Tabs (Meloxicam) .... Take 1/2 To 1 Tablet Twice A Day For Pain 11)  Tramadol Hcl 50 Mg Tabs (Tramadol Hcl) .... Take 1-2 Pills Every 6 Hours If Needed For Pain 12)  Ibuprofen 800 Mg Tabs (Ibuprofen) .... Take 1 Pill Every 8 Hours As Needed For Pain - Do Not Take With Meloxicam  Allergies (verified): 1)  ! Benadryl 2)  ! * Allegra D 3)  ! Codeine  Past History:  Past medical, surgical, family and social histories (including risk factors) reviewed, and no  changes noted (except as noted below).  Past Medical History: Reviewed history from 07/13/2009 and no changes required. Pre-eclampsia in 1996 pregnancy GERD Allergic rhinitis Bipolar DM htn hld       Past Surgical History: Reviewed history from 04/29/2007 and no changes required. Cholecystectomy -, LTCS X4 -BTL      Family History: Reviewed history from 04/29/2007 and no changes required. B:  Asthma, Children: oldest two kids  obese, sickle trait from dad?, F:  Healthy, M:  Hodgkins, DM, HTN, obesity,  No breast CA or MI <age 3, PGF:  Colon CA dx`d in 80`s, MI in 80`s No history of mental illness in family.      Social History: Reviewed history from 08/31/2007 and no changes required. Not married, has 4 children, 18, 17, 11,4.  Works at [employer] as a Conservation officer, nature.  Lost mother in 9/08.   Never Smoked currently separated from significant other Alcohol use-no Drug use-no    Physical Exam  General:  Vital signs reviewed. Well-developed, well-nourished patient in NAD.  Awake and cooperative  Eyes:  no conjunctival pallor.   Mouth:  oral mucosa moist and pink  Lungs:  clear to auscultation bilaterally without wheezing, rales, or rhonchi.  Normal work of breathing  Heart:  Regular rate and rhythm without murmur, rub, or gallop.  Normal S1/S2  Abdomen:  soft/nontender/nondistended.  No organomegaly, bowel sounds present.  No tenderness in abdomen even on deep palpation.    Msk:  No deformity or scoliosis noted of thoracic or lumbar spine.  No pain on palpation of thighs or back.     Impression & Recommendations:  Problem # 1:  PELVIC PAIN, CHRONIC (ICD-789.09) Assessment Unchanged Still with chronic pelvic pain.  Precepted this case with Dr. Swaziland.  Plan to start patient on Ibuprofen as well as Tramadol for pain relief and perhaps some relief with bleeding with Ibuprofen.  Unable to get a feel for amount or degree of patient's pain.  Patient is not constipated, describes daily  bowel movements of normal consistency and description.  Abdominal exam unremarkable.  Unable to elicit any leg pain on exam as well.  As above, plan to treat patient's pain and defer to [facility].   Her updated medication list for this problem includes:    Aspirin Adult Low Strength 81 Mg Tbec (Aspirin) .Marland Kitchen... Take 1 tab by mouth daily    Meloxicam 15 Mg Tabs (Meloxicam) .Marland Kitchen... Take 1/2 to 1 tablet twice a day for pain    Tramadol Hcl 50 Mg Tabs (Tramadol hcl) .Marland Kitchen... Take 1-2 pills every 6 hours if needed for pain    Ibuprofen 800 Mg Tabs (Ibuprofen) .Marland Kitchen... Take 1 pill every 8 hours as needed for pain - do not take with meloxicam  Orders: [facility]- Est Level  3 (16109)  Problem # 2:  MENORRHAGIA (ICD-626.2) Assessment: Unchanged Patient has attempted Ibuprofen and Provera  in past.  No change in excessive bleeding.  Will defer to appointment at [facility] in 2 weeks.  Patient does have known history of fibroids, possible cause of bleeding.   Her updated medication list for this problem includes:    Provera 10 Mg Tabs (Medroxyprogesterone acetate) .Marland Kitchen... 1 by mouth once daily on days 14-19 each month to control heavy menses  Orders: [facility]- Est Level  3 ([address]284)  Complete Medication List: 1)  Fluticasone Propionate 50 Mcg/act Susp (Fluticasone propionate) .... 2 sprays in each nostril daily 2)  B-4 Med Compression Hose Mens Misc (Elastic bandages & supports) .... Medium size with 30-38mm hg of support to wear every day.  knee highs please.  1 pair 3)  Lantus Solostar 100 Unit/ml Soln (Insulin glargine) .... Inject 45 units subcutaneously q am. dispense one box; increase as directed by your doctor, qs 4)  Aspirin Adult Low Strength 81 Mg Tbec (Aspirin) .... Take 1 tab by mouth daily 5)  Singulair 10 Mg Tabs (Montelukast sodium) .... One by mouth daily 6)  Lisinopril 10 Mg Tabs (Lisinopril) .Marland Kitchen.. 1 tab by mouth daily for blood pressure 7)  Zocor 40 Mg Tabs (Simvastatin) .Marland Kitchen.. 1 tab by mouth daily  for cholesterol 8)  Depakote 250 Mg Tbec (Divalproex sodium) .... Take one tablet twice a day for 3 days, then increased to 2 tablets twice a day 9)  Provera 10 Mg Tabs (Medroxyprogesterone acetate) .Marland Kitchen.. 1 by mouth once daily on days 14-19 each month to control heavy menses 10)  Meloxicam 15 Mg Tabs (Meloxicam) .... Take 1/2 to 1 tablet twice a day for pain 11)  Tramadol Hcl 50 Mg Tabs (Tramadol hcl) .... Take 1-2 pills every 6 hours if needed for pain 12)  Ibuprofen 800 Mg Tabs (Ibuprofen) .... Take 1 pill every 8 hours as needed for pain - do not take with meloxicam  Other Orders: Urinalysis-[facility] (00000) U Preg-[facility] (13244)  Patient Instructions: 1)  We are going to try Ibuprofen 800 mg to help with both the pain and the bleeding.  You can take this every 8 hours.  Don't take with the Meloxicam. 2)  For just the pain, we will try Tramadol today.  Take 1-2 pills every 6 hours for pain.  3)  Make sure you keep your appointment with [facility] so they can talk about all the different options for your fibroid, bleeding, and pain. 4)  It was good to meet you, I hope you start feeling better.   Prescriptions: IBUPROFEN 800 MG TABS (IBUPROFEN) Take 1 pill every 8 hours as needed for pain - Do not take with Meloxicam  #45 x 0   Entered and Authorized by:   Renold Don MD   Signed by:   Renold Don MD on 08/16/2009   Method used:   Electronically to        [facility] (retail)       [address]       [place], [place]  [address]       Ph: [phone]       Fax: [phone]   RxID:   [id number] TRAMADOL HCL 50 MG TABS (TRAMADOL HCL) Take 1-2 pills every 6 hours if needed for pain  #12 x 3   Entered and Authorized by:   Renold Don MD   Signed by:   Renold Don MD on 08/16/2009   Method used:   Print then Give  to Patient   RxID:   [id number]   Laboratory Results   Urine Tests  Date/Time Received: August 16, 2009 11:46 AM  Date/Time  Reported: August 16, 2009 12:21 PM   Routine Urinalysis   Color: yellow Appearance: Clear Glucose: negative   (Normal Range: Negative) Bilirubin: negative   (Normal Range: Negative) Ketone: negative   (Normal Range: Negative) Spec. Gravity: >=1.030   (Normal Range: 1.003-1.035) Blood: large   (Normal Range: Negative) pH: 6.5   (Normal Range: 5.0-8.0) Protein: 30   (Normal Range: Negative) Urobilinogen: 1.0   (Normal Range: 0-1) Nitrite: negative   (Normal Range: Negative) Leukocyte Esterace: negative   (Normal Range: Negative)  Urine Microscopic WBC/HPF: 1-5 Bacteria/HPF: 3+ cocci Epithelial/HPF: 10 - >20 Crystals/HPF: rare calcium oxalate Other: mod number of clue cells    Urine HCG: negative Comments: ...............test performed by......Marland KitchenBonnie A. Swaziland, MLS (ASCP)cm

## 2010-02-08 NOTE — Progress Notes (Signed)
Summary: triage  Phone Note Call from Patient Call back at Home Phone [phone]   Caller: Patient Summary of Call: has a question about smelling amonia when she breathes.  going on for a few weeks... Initial call taken by: De Nurse,  September 18, 2009 1:33 PM  Follow-up for Phone Call        LM Follow-up by: Golden Circle RN,  September 18, 2009 2:12 PM  Additional Follow-up for Phone Call Additional follow up Details #1::        pt returned call Additional Follow-up by: De Nurse,  September 18, 2009 3:53 PM    Additional Follow-up for Phone Call Additional follow up Details #2::    she had been seen at [facility] clinic for BV & was given a med. states it makes her breath smell odd. states she finished it after Labor Day. still has smell of amonia. she cannot miss work so she is going to UC tonight Follow-up by: Golden Circle RN,  September 18, 2009 4:54 PM

## 2010-02-08 NOTE — Assessment & Plan Note (Signed)
Summary: iud fell out/[place]   Vital Signs:  Patient profile:   40 year old female Height:      60.25 inches Weight:      217.7 pounds BMI:     42.32 Temp:     98.8 degrees F oral Pulse rate:   61 / minute BP sitting:   137 / 97  (left arm) Cuff size:   large  Vitals Entered By: Gladstone Pih (July 20, 2009 11:17 AM) CC: / IUD fell out last night or this AM Is Patient Diabetic? Yes Did you bring your meter with you today? No Pain Assessment Patient in pain? no        Primary Care Provider:  . [facility]  CC:  / IUD fell out last night or this AM.  History of Present Illness: IUD 'fell out" last night. She did not bring it with her. She is very worried that her heavy menses will start again soon and she wants something for that. She does not like to take pills.  Habits & Providers  Alcohol-Tobacco-Diet     Tobacco Status: never  Allergies: 1)  ! Benadryl 2)  ! * Allegra D 3)  ! Codeine  Physical Exam  General:  alert and overweight-appearing.   Psych:  dysphoric affect and poor eye contact.  Argumentative   Impression & Recommendations:  Problem # 1:  CONTRACEPTIVE MANAGEMENT (ICD-V25.09) this is the SECOND time an IUD has reportedly fallen out. She did not bring it with her. Lengthy discussion re options which for her esentially come down to provera 5 days a month or hysterectomy for menorrhagia. Losing 2 IUDs I would not do a third trial. Imllanon has not been approved and there is no data I amaware of for it's use in menorrhagia . (She is s/pBTL) Depo provera may or may not work well for her but she is obese and additional weight gain would not be beneficial.  OCP could be an option as she is a non smoker but her  main issue is not liking to take pills --so 5 days a month would be easier for her hn 30 days a month.   Complete Medication List: 1)  Fluticasone Propionate 50 Mcg/act Susp (Fluticasone propionate) .... 2 sprays in each nostril daily 2)  B-4  Med Compression Hose Mens Misc (Elastic bandages & supports) .... Medium size with 30-1mm hg of support to wear every day.  knee highs please.  1 pair 3)  Lantus Solostar 100 Unit/ml Soln (Insulin glargine) .... Inject 45 units subcutaneously q am. dispense one box; increase as directed by your doctor, qs 4)  Aspirin Adult Low Strength 81 Mg Tbec (Aspirin) .... Take 1 tab by mouth daily 5)  Singulair 10 Mg Tabs (Montelukast sodium) .... One by mouth daily 6)  Lisinopril 10 Mg Tabs (Lisinopril) .Marland Kitchen.. 1 tab by mouth daily for blood pressure 7)  Zocor 40 Mg Tabs (Simvastatin) .Marland Kitchen.. 1 tab by mouth daily for cholesterol 8)  Diclofenac Sodium 75 Mg Tbec (Diclofenac sodium) .Marland Kitchen.. 1 tab by mouth two times a day as needed pain 9)  Seroquel 50 Mg Tabs (Quetiapine fumarate) .... Take one tablet day 1, then one tablet twice a day x 1 day, then two tablets twice a day 10)  Carbamazepine 200 Mg Tabs (Carbamazepine) .... Take one tablet twice a day 11)  Provera 10 Mg Tabs (Medroxyprogesterone acetate) .Marland Kitchen.. 1 by mouth once daily on days 14-19 each month to control heavy menses  Other  Orders: [facility]- Est Level  3 (30865)  Patient Instructions: 1)  We are starting you on medroxyprogesterone tabs on days 14-19 every month to control your bleeding. 2)  Please follow up with Dr Earnest Bailey. Prescriptions: PROVERA 10 MG TABS (MEDROXYPROGESTERONE ACETATE) 1 by mouth once daily on days 14-19 each month to control heavy menses  #5 x 3   Entered and Authorized by:   Denny Levy MD   Signed by:   Denny Levy MD on 07/20/2009   Method used:   Printed then faxed to ...       [facility]       [address]       [place], [place]  [address]       Ph: [phone]       Fax: [phone]   RxID:   [id number] PROVERA 10 MG TABS (MEDROXYPROGESTERONE ACETATE) 1 by mouth once daily on days 14-19 each month to control heavy menses  #5 x 3   Entered and Authorized by:   Denny Levy MD   Signed  by:   Denny Levy MD on 07/20/2009   Method used:   Electronically to        [facility] (retail)       [address]       [place], [place]  [address]       Ph: [phone]       Fax: [phone]   RxID:   [id number]

## 2010-02-08 NOTE — Consult Note (Signed)
Summary: Sleep Study  Sleep Study   Imported By: De Nurse 01/17/2010 14:39:21  _____________________________________________________________________  External Attachment:    Type:   Image     Comment:   External Document  Appended Document: Sleep Study: Mild OSA    Impression & Recommendations:  Problem # 1:  DROWSINESS (ICD-780.09) Sleep study shows mild Sleep apnea.  I beleive etiology is multifactorial.  Will encourage exercise and wight loss, once bipolar adequately treated, will readdress symptoms and need for treatment.  Complete Medication List: 1)  Nasonex 50 Mcg/act Susp (Mometasone furoate) .... Two sprays per nostril daily 2)  B-4 Med Compression Hose Mens Misc (Elastic bandages & supports) .... Medium size with 30-51mm hg of support to wear every day.  knee highs please.  1 pair 3)  Lantus Solostar 100 Unit/ml Soln (Insulin glargine) .... Inject 50 units subcutaneously q am.  increase as directed by your doctor.  dispense qs 1 month 4)  Aspirin Adult Low Strength 81 Mg Tbec (Aspirin) .... Take 1 tab by mouth daily 5)  Singulair 10 Mg Tabs (Montelukast sodium) .... One by mouth daily 6)  Accupril 10 Mg Tabs (Quinapril hcl) .... Take one tablet daily 7)  Lipitor 20 Mg Tabs (Atorvastatin calcium) .... Take one tablet daily 8)  Depakote 250 Mg Tbec (Divalproex sodium) .... Take one tablet twice a day for 3 days, then increased to 2 tablets twice a day 9)  Provera 10 Mg Tabs (Medroxyprogesterone acetate) .Marland Kitchen.. 1 by mouth once daily on days 14-19 each month to control heavy menses 10)  Ibuprofen 800 Mg Tabs (Ibuprofen) .... Take 1 pill every 8 hours as needed for pain - do not take with meloxicam 11)  Promethazine Hcl 25 Mg/ml Soln (Promethazine hcl) .... Take 1 pill every 6 hours as needed for nausea 12)  Epipen 0.3 Mg/0.58ml Devi (Epinephrine) .... If having allergic reaction with trouble breathing, usep en and go straight to er 13)  Hydrocodone-acetaminophen 5-500 Mg  Tabs (Hydrocodone-acetaminophen) .... One q 6 hours as needed for severe pain 14)  Haloperidol 2 Mg Tabs (Haloperidol) .... Take one tablet twice a day 15)  Depakote Er 500 Mg Xr24h-tab (Divalproex sodium) .... Take two tabs daily (in place of 250mg  two tabs twice a day) 16)  Gabapentin 100 Mg Caps (Gabapentin) .Marland Kitchen.. 1 tab by mouth at bedtime for the next week then 2 tabs at bedtime

## 2010-02-08 NOTE — Progress Notes (Signed)
Summary: prior authorization for singulair  Phone Note Outgoing Call   Call placed by: Asher Muir MD,  April 05, 2009 1:43 PM Summary of Call: called and left vm for pt to call back. If she call back,  I am filling out a prior authorization on her and need to know the answer to the following questions:  has she ever tried:  zyrtec (cetirizine), claritin (loratadine), allegra, or xyzal?  If she has tried them, did they work.  If she has not tried them, I have to switch her singulair to one of these because her insurance will not pay for singulair until she tries and fails one of these (in addition to flonase) Thanks! Initial call taken by: Asher Muir MD,  April 05, 2009 1:46 PM  Follow-up for Phone Call        states singulair does not work. using fluticasone with poor results. advised seeing a doctor. she is unable to come until friday at 8:15. she sounded congested. no fever. to call if able to come sooner Follow-up by: Golden Circle RN,  April 05, 2009 3:49 PM

## 2010-02-08 NOTE — Assessment & Plan Note (Signed)
Summary: discuss meds/[place]   Vital Signs:  Patient profile:   40 year old female Weight:      219 pounds Temp:     98.3 degrees F oral Pulse rate:   86 / minute Pulse rhythm:   regular BP sitting:   115 / 89  (left arm) Cuff size:   large  Vitals Entered By: Loralee Pacas CMA (July 24, 2009 2:38 PM)  Primary Care Provider:  Delbert Harness MD   History of Present Illness: 39 yo here to discuss continued menstrual bleeding and recent start of carbamezepien for bipolar d/o  menorrhagia:  was seen last week as IUD fell out and patient having heavy menstrual bleeding.  Pt with history of uterine fibroids.  Patient took provera as prescribed by Dr. Jennette Kettle.  Still having some bleeding with clots.  Wants another IUD.  Bipolar d/o  has taken carbamzepine for 4-5 days  having dizziness, headaches, somnolence.  Would like to try another medications.    Current Medications (verified): 1)  Fluticasone Propionate 50 Mcg/act Susp (Fluticasone Propionate) .... 2 Sprays in Each Nostril Daily 2)  B-4 Med Compression Hose Mens   Misc (Elastic Bandages & Supports) .... Medium Size With 30-45mm Hg of Support To Wear Every Day.  Knee Highs Please.  1 Pair 3)  Lantus Solostar 100 Unit/ml Soln (Insulin Glargine) .... Inject 45 Units Subcutaneously Q Am. Dispense One Box; Increase As Directed By Your Doctor, Qs 4)  Aspirin Adult Low Strength 81 Mg Tbec (Aspirin) .... Take 1 Tab By Mouth Daily 5)  Singulair 10 Mg Tabs (Montelukast Sodium) .... One By Mouth Daily 6)  Lisinopril 10 Mg Tabs (Lisinopril) .Marland Kitchen.. 1 Tab By Mouth Daily For Blood Pressure 7)  Zocor 40 Mg Tabs (Simvastatin) .Marland Kitchen.. 1 Tab By Mouth Daily For Cholesterol 8)  Diclofenac Sodium 75 Mg Tbec (Diclofenac Sodium) .Marland Kitchen.. 1 Tab By Mouth Two Times A Day As Needed Pain 9)  Seroquel 50 Mg Tabs (Quetiapine Fumarate) .... Take One Tablet Day 1, Then One Tablet Twice A Day X 1 Day, Then Two Tablets Twice A Day 10)  Depakote 250 Mg Tbec (Divalproex Sodium) .... Take  One Tablet Twice A Day For 3 Days, Then Increased To 2 Tablets Twice A Day 11)  Provera 10 Mg Tabs (Medroxyprogesterone Acetate) .Marland Kitchen.. 1 By Mouth Once Daily On Days 14-19 Each Month To Control Heavy Menses 12)  Meloxicam 15 Mg Tabs (Meloxicam) .... Take 1/2 To 1 Tablet Twice A Day For Pain  Allergies: 1)  ! Benadryl 2)  ! * Allegra D 3)  ! Codeine PMH-FH-SH reviewed-no changes except otherwise noted  Review of Systems      See HPI  Physical Exam  General:  alert and overweight-appearing.  Poor eye contact Psych:  Oriented X3, memory intact for recent and remote, and poor eye contact.     Impression & Recommendations:  Problem # 1:  MENORRHAGIA (ICD-626.2) Patient no longer wants provera and desire IUD.  Has already have two IUD's fall out.   Hgb WNL.  Advised daily multivitamin.  Patient declined OCP's, does not want to take provera anymore.  Discussed with patient OB referral for further management and possible hysterectomy for uterine fiboids. She is agreeable  Her updated medication list for this problem includes:    Provera 10 Mg Tabs (Medroxyprogesterone acetate) .Marland Kitchen... 1 by mouth once daily on days 14-19 each month to control heavy menses  Orders: Hemoglobin-[facility] (21308) [facility]- Est Level  3 (65784) Gynecologic Referral (  Gyn)  Problem # 2:  BIPOLAR DISORDER UNSPECIFIED (ICD-296.80)  Will change to depakote- patient given coupon and prescription with instructions to shop around for best price and [facility].  Patient may also have same side effects as common on depakote as well.  Started low and will titrate up.  Pt has appt with Dr. Pascal Lux in 1 week.  Pat to follow-up with me in 2 weeks.  Given pregnancy precautions.  Orders: [facility]- Est Level  3 (09811)  Complete Medication List: 1)  Fluticasone Propionate 50 Mcg/act Susp (Fluticasone propionate) .... 2 sprays in each nostril daily 2)  B-4 Med Compression Hose Mens Misc (Elastic bandages & supports) .... Medium size with  30-59mm hg of support to wear every day.  knee highs please.  1 pair 3)  Lantus Solostar 100 Unit/ml Soln (Insulin glargine) .... Inject 45 units subcutaneously q am. dispense one box; increase as directed by your doctor, qs 4)  Aspirin Adult Low Strength 81 Mg Tbec (Aspirin) .... Take 1 tab by mouth daily 5)  Singulair 10 Mg Tabs (Montelukast sodium) .... One by mouth daily 6)  Lisinopril 10 Mg Tabs (Lisinopril) .Marland Kitchen.. 1 tab by mouth daily for blood pressure 7)  Zocor 40 Mg Tabs (Simvastatin) .Marland Kitchen.. 1 tab by mouth daily for cholesterol 8)  Diclofenac Sodium 75 Mg Tbec (Diclofenac sodium) .Marland Kitchen.. 1 tab by mouth two times a day as needed pain 9)  Seroquel 50 Mg Tabs (Quetiapine fumarate) .... Take one tablet day 1, then one tablet twice a day x 1 day, then two tablets twice a day 10)  Depakote 250 Mg Tbec (Divalproex sodium) .... Take one tablet twice a day for 3 days, then increased to 2 tablets twice a day 11)  Provera 10 Mg Tabs (Medroxyprogesterone acetate) .Marland Kitchen.. 1 by mouth once daily on days 14-19 each month to control heavy menses 12)  Meloxicam 15 Mg Tabs (Meloxicam) .... Take 1/2 to 1 tablet twice a day for pain  Patient Instructions: 1)  Take a daily multivitamin 2)  Shop around for best price on new medicine depakote- [facility] and see coupon attached 3)  Improtant to not get pregnant on medication!  condoms and abstinence. 4)  Follow-up in 1-2 weeks  Prescriptions: MELOXICAM 15 MG TABS (MELOXICAM) take 1/2 to 1 tablet twice a day for pain  #30 x 0   Entered and Authorized by:   Delbert Harness MD   Signed by:   Delbert Harness MD on 07/24/2009   Method used:   Print then Give to Patient   RxID:   [id number] DEPAKOTE 250 MG TBEC (DIVALPROEX SODIUM) take one tablet twice a day for 3 days, then increased to 2 tablets twice a day  #120 x 0   Entered and Authorized by:   Delbert Harness MD   Signed by:   Delbert Harness MD on 07/24/2009   Method used:   Print then Give to Patient   RxID:    [id number]   Laboratory Results   Blood Tests   Date/Time Received: July 24, 2009 3:12 PM  Date/Time Reported: July 24, 2009 4:04 PM     CBC   HGB:  12.8 g/dL   (Normal Range: 40.1-02.7 in Males, 12.0-15.0 in Females) Comments: capillary sample ...............test performed by......Marland KitchenBonnie A. [place], MLS (ASCP)cm

## 2010-02-08 NOTE — Progress Notes (Signed)
  Phone Note Call from Patient   Caller: Patient Call For: [phone] Summary of Call: Pt being having headaches & numbness to rt side and bottom of feet x 2 wks.  Voice sound slurred Initial call taken by: Abundio Miu,  December 21, 2009 3:01 PM  Follow-up for Phone Call        patient rates headache now at a 9.  states she has had headache for 2 weeks. now has numbess top of right arm around neck to left arm , down both legs into feet. this has been going on for weeks.  appointment scheduled today . Follow-up by: Theresia Lo RN,  December 21, 2009 3:08 PM

## 2010-02-08 NOTE — Progress Notes (Signed)
Summary: triage  Phone Note Call from Patient Call back at Home Phone [phone]   Caller: Patient Summary of Call: has a knot on the side of neck and is not painful or anything but is not sure if she needs to come in Initial call taken by: De Nurse,  January 11, 2009 8:37 AM  Follow-up for Phone Call        states she just noticed it yesterday. size of her thumbtip. not painful. on L side of neck.  does not want to wait for pcp. will see Dr. Janalyn Harder at 11am Follow-up by: Golden Circle RN,  January 11, 2009 8:41 AM

## 2010-02-08 NOTE — Assessment & Plan Note (Signed)
Summary: f/u,df   Vital Signs:  Patient profile:   40 year old female Height:      60.25 inches Weight:      219.31 pounds BMI:     42.63 BSA:     1.95 Temp:     98.2 degrees F Pulse rate:   89 / minute BP sitting:   135 / 95  Vitals Entered By: Jone Baseman CMA (November 02, 2009 3:21 PM) CC: meds not working Is Patient Diabetic? No Pain Assessment Patient in pain? no        Primary Care Provider:  Delbert Harness MD  CC:  meds not working.  History of Present Illness: 40 yo here for follow-up of bipolar do  Her CC was "meds not working".  On further questioning she is not taking any medications and I do not think she ever tried depakote.  Continues to have symptoms of depression and agitation without SI, HI, hallucinations.  She spoke a great deal about a coworker who makes her agitated.  She has tried to cope by asking to be placed in a different job and by avoiding this person.  She says she gets so angry sometimes she "wants to punch her."  She does not have any thoughts of harming this person, but gets irritated just at the moment of confrontation and feels she is able to control this feeling.   She has not returned call to follow-up with Dr. Pascal Lux.   peanut allergy:  patient states she has known she has a peanut allergy for years.  Recenlty, ate peanuts again and her mouth became itchy and swollen, difficult to breath.  Self-resolved.  She is unsure why she ate peanuts again.  Habits & Providers  Alcohol-Tobacco-Diet     Tobacco Status: never  Current Medications (verified): 1)  Nasonex 50 Mcg/act Susp (Mometasone Furoate) .... Two Sprays Per Nostril Daily 2)  B-4 Med Compression Hose Mens   Misc (Elastic Bandages & Supports) .... Medium Size With 30-31mm Hg of Support To Wear Every Day.  Knee Highs Please.  1 Pair 3)  Lantus Solostar 100 Unit/ml Soln (Insulin Glargine) .... Inject 45 Units Subcutaneously Q Am. Dispense One Box; Increase As Directed By Your Doctor,  Qs 4)  Aspirin Adult Low Strength 81 Mg Tbec (Aspirin) .... Take 1 Tab By Mouth Daily 5)  Singulair 10 Mg Tabs (Montelukast Sodium) .... One By Mouth Daily 6)  Accupril 10 Mg Tabs (Quinapril Hcl) .... Take One Tablet Daily 7)  Lipitor 20 Mg Tabs (Atorvastatin Calcium) .... Take One Tablet Daily 8)  Depakote 250 Mg Tbec (Divalproex Sodium) .... Take One Tablet Twice A Day For 3 Days, Then Increased To 2 Tablets Twice A Day 9)  Provera 10 Mg Tabs (Medroxyprogesterone Acetate) .Marland Kitchen.. 1 By Mouth Once Daily On Days 14-19 Each Month To Control Heavy Menses 10)  Ibuprofen 800 Mg Tabs (Ibuprofen) .... Take 1 Pill Every 8 Hours As Needed For Pain - Do Not Take With Meloxicam 11)  Promethazine Hcl 25 Mg/ml Soln (Promethazine Hcl) .... Take 1 Pill Every 6 Hours As Needed For Nausea 12)  Epipen 0.3 Mg/0.27ml Devi (Epinephrine) .... If Having Allergic Reaction With Trouble Breathing, Usep En and Go Straight To Er  Allergies: 1)  ! Benadryl 2)  ! * Allegra D 3)  ! Codeine 4)  ! * Peanuts PMH-FH-SH reviewed for relevance  Review of Systems      See HPI  Physical Exam  General:  Well-developed,well-nourished,in no acute  distress; alert,appropriate and cooperative throughout examination   Impression & Recommendations:  Problem # 1:  BIPOLAR DISORDER UNSPECIFIED (ICD-296.80)  Has not been on any medications- patient and I are unclear exactly why.  She thinks it may have been cost but I confirmed with the health department that this was on their formulary.  Advised to start medication- I have refeaxed it to the health department.  Encourages continued follow-up with Dr. Pascal Lux.  Will follow-up with patient in 2 weeks  Orders: [facility]- Est Level  3 (10272)  Problem # 2:  PERSONAL HISTORY OF ALLERGY TO PEANUTS (ICD-V15.01) given personal history of possible peanut allergy, given instructions to avoid peanuts and since had mild airway issues in past, warned of possible severe reaction and need to carry  epipen.  Complete Medication List: 1)  Nasonex 50 Mcg/act Susp (Mometasone furoate) .... Two sprays per nostril daily 2)  B-4 Med Compression Hose Mens Misc (Elastic bandages & supports) .... Medium size with 30-66mm hg of support to wear every day.  knee highs please.  1 pair 3)  Lantus Solostar 100 Unit/ml Soln (Insulin glargine) .... Inject 45 units subcutaneously q am. dispense one box; increase as directed by your doctor, qs 4)  Aspirin Adult Low Strength 81 Mg Tbec (Aspirin) .... Take 1 tab by mouth daily 5)  Singulair 10 Mg Tabs (Montelukast sodium) .... One by mouth daily 6)  Accupril 10 Mg Tabs (Quinapril hcl) .... Take one tablet daily 7)  Lipitor 20 Mg Tabs (Atorvastatin calcium) .... Take one tablet daily 8)  Depakote 250 Mg Tbec (Divalproex sodium) .... Take one tablet twice a day for 3 days, then increased to 2 tablets twice a day 9)  Provera 10 Mg Tabs (Medroxyprogesterone acetate) .Marland Kitchen.. 1 by mouth once daily on days 14-19 each month to control heavy menses 10)  Ibuprofen 800 Mg Tabs (Ibuprofen) .... Take 1 pill every 8 hours as needed for pain - do not take with meloxicam 11)  Promethazine Hcl 25 Mg/ml Soln (Promethazine hcl) .... Take 1 pill every 6 hours as needed for nausea 12)  Epipen 0.3 Mg/0.13ml Devi (Epinephrine) .... If having allergic reaction with trouble breathing, usep en and go straight to er  Patient Instructions: 1)  Try to avoid the person at work who gets on your nerves.  You did a good job by asking to avoid directly working with this person. 2)  You can just walk away. 3)  Remember to reschedule with Dr. Pascal Lux 4)  Start depakote 5)  If you have feelings of wanting to hurt yourself or someone, it is an emergency- call 911! 6)  Follow-up in 2 weeks Prescriptions: IBUPROFEN 800 MG TABS (IBUPROFEN) Take 1 pill every 8 hours as needed for pain - Do not take with Meloxicam  #45 x 0   Entered and Authorized by:   Delbert Harness MD   Signed by:   Delbert Harness MD on  11/02/2009   Method used:   Faxed to ...       [facility] Department (retail)       [address] [place], [place]  [address]       Ph: [phone]       Fax: [phone]   RxID:   [id number] DEPAKOTE 250 MG TBEC (DIVALPROEX SODIUM) take one tablet twice a day for 3 days, then increased to 2 tablets twice a day  #120 x 0   Entered and Authorized by:  Delbert Harness MD   Signed by:   Delbert Harness MD on 11/02/2009   Method used:   Faxed to ...       [facility] Department (retail)       [address] [place], [place]  [address]       Ph: [phone]       Fax: [phone]   RxID:   [id number] EPIPEN 0.3 MG/0.3ML DEVI (EPINEPHRINE) if having allergic reaction with trouble breathing, usep en and go straight to ER  #1 x 1   Entered and Authorized by:   Delbert Harness MD   Signed by:   Delbert Harness MD on 11/02/2009   Method used:   Faxed to ...       [facility] Department (retail)       [address] [place], [place]  [address]       Ph: [phone]       Fax: [phone]   RxID:   [id number]    Orders Added: 1)  [facility]- Est Level  3 [88416]

## 2010-02-08 NOTE — Progress Notes (Signed)
Summary: triage  Phone Note Call from Patient Call back at Home Phone [phone]   Caller: Patient Summary of Call: Pt says she is having reaction to medication she was given and wants to be seen this afternoon. Initial call taken by: Clydell Hakim,  September 28, 2009 1:41 PM  Follow-up for Phone Call        states she still is smelling [place]. she has finisihed the med for BV. states her legs are numb & she has vag itching.  states she can only come in late day tomorrow. we do not have any late day appt. states she will go to UC  Follow-up by: Golden Circle RN,  September 28, 2009 1:46 PM

## 2010-02-08 NOTE — Progress Notes (Signed)
Summary: phone call  Phone Note Call from Patient Call back at Home Phone [phone]   Summary of Call: Pt was recenlty increased on her wellbutrin to 400mg  daily and she states she is feeling very uncomfortable like she had too much caffiene and is starting to feel some chest discomfort.  Pt denies any numbness in extremity or pain able to do all functions, just nervous about this feeling.  Pt told to go back to previous dose of well butrin and follow up with pcp next week to see if there is a better regimen for the pt.  Pt given red flags to look out for if chest pain got worse.   Pt agreed.  Initial call taken by: Antoine Primas DO,  July 08, 2009 9:04 PM

## 2010-02-08 NOTE — Miscellaneous (Signed)
Summary: NO ALLERGY REFERRAL AVAILABLE  Clinical Lists Changes No participating members for [facility] for allergy or ENT. Delbert Harness MD  December 26, 2009 8:49 AM

## 2010-02-08 NOTE — Assessment & Plan Note (Signed)
Summary: F/U/RH   Vital Signs:  Patient profile:   40 year old female Height:      60.25 inches Weight:      223 pounds BMI:     43.35 Temp:     98.4 degrees F oral Pulse rate:   941 / minute BP sitting:   145 / 96  (left arm) Cuff size:   large  Vitals Entered By: Jimmy Footman, CMA (December 26, 2009 9:21 AM) CC: follow up Is Patient Diabetic? Yes Did you bring your meter with you today? No   Primary Care Provider:  Delbert Harness MD  CC:  follow up.  History of Present Illness: 40 yo here for follow-up of bipolar disorder.  Still taking depakote  notes increasingly forgetful, poor concentration, poor driving.  States she forgets peoples names.    Continued hallucinations, now even during the day or at night when wakin up from sleep.  one episode described as person with "plaid pajamas walking down the hall"  This makes her feel "like she is crazy"  Is watchingtv with other people and she sees somethign different then other people seeing.    Falls asleep 9-10 PM, wakes up 11-12, most of the times seeas a hallucination.    Irritability increased.    No thoughts of harming others, no SI, occasiaional sadness.  Mostly thoughts of avoidance.  When asked if she hears voices, she says- "if I do I don't pay any attention"   Habits & Providers  Alcohol-Tobacco-Diet     Tobacco Status: never  Current Medications (verified): 1)  Nasonex 50 Mcg/act Susp (Mometasone Furoate) .... Two Sprays Per Nostril Daily 2)  B-4 Med Compression Hose Mens   Misc (Elastic Bandages & Supports) .... Medium Size With 30-46mm Hg of Support To Wear Every Day.  Knee Highs Please.  1 Pair 3)  Lantus Solostar 100 Unit/ml Soln (Insulin Glargine) .... Inject 50 Units Subcutaneously Q Am.  Increase As Directed By Your Doctor.  Dispense Qs 1 Month 4)  Aspirin Adult Low Strength 81 Mg Tbec (Aspirin) .... Take 1 Tab By Mouth Daily 5)  Singulair 10 Mg Tabs (Montelukast Sodium) .... One By Mouth Daily 6)  Accupril  10 Mg Tabs (Quinapril Hcl) .... Take One Tablet Daily 7)  Lipitor 20 Mg Tabs (Atorvastatin Calcium) .... Take One Tablet Daily 8)  Depakote 250 Mg Tbec (Divalproex Sodium) .... Take One Tablet Twice A Day For 3 Days, Then Increased To 2 Tablets Twice A Day 9)  Provera 10 Mg Tabs (Medroxyprogesterone Acetate) .Marland Kitchen.. 1 By Mouth Once Daily On Days 14-19 Each Month To Control Heavy Menses 10)  Ibuprofen 800 Mg Tabs (Ibuprofen) .... Take 1 Pill Every 8 Hours As Needed For Pain - Do Not Take With Meloxicam 11)  Promethazine Hcl 25 Mg/ml Soln (Promethazine Hcl) .... Take 1 Pill Every 6 Hours As Needed For Nausea 12)  Epipen 0.3 Mg/0.62ml Devi (Epinephrine) .... If Having Allergic Reaction With Trouble Breathing, Usep En and Go Straight To Er 13)  Hydrocodone-Acetaminophen 5-500 Mg Tabs (Hydrocodone-Acetaminophen) .... One Q 6 Hours As Needed For Severe Pain 14)  Haloperidol 2 Mg Tabs (Haloperidol) .... Take One Tablet Twice A Day  Allergies: 1)  ! Benadryl 2)  ! * Allegra D 3)  ! Codeine 4)  ! * Peanuts PMH-FH-SH reviewed for relevance  Review of Systems      See HPI  Physical Exam  General:  alert, in no acute distress.  Poor eye contact. Psych:  Oriented X3 and memory intact for recent and remote.  Affect at baseline, poor eye contact, not depressed or anxious appearing.  slightly irritable.  NOt responding to any internal stimuli.   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  She is not suicidal, homicidal and has no immediate risk of hearm to self or others.  She does not have to return to work until January.  Our office has not been able to get appointment with psychiatry.  I personally called [facility] and patient can been seen on january 5th 8 am with Ethelene Browns for medication management.   patient contracts for safety and was given crisis line for  [facility] and instructions she can go to ER if worsens.  Will start haldol 2 mg two times a day today while contiuning depakote until she can be seen by psychiatry.  Discussed risks of EPS, dystonic reaction.  patient to make follow-up if needed prior to appt with [facility]. >30 minutes were spent in counseling and in arrangement of care for this patient.  Orders: [facility]- Est  Level 4 ([address]214)  Complete Medication List: 1)  Nasonex 50 Mcg/act Susp (Mometasone furoate) .... Two sprays per nostril daily 2)  B-4 Med Compression Hose Mens Misc (Elastic bandages & supports) .... Medium size with 30-47mm hg of support to wear every day.  knee highs please.  1 pair 3)  Lantus Solostar 100 Unit/ml Soln (Insulin glargine) .... Inject 50 units subcutaneously q am.  increase as directed by your doctor.  dispense qs 1 month 4)  Aspirin Adult Low Strength 81 Mg Tbec (Aspirin) .... Take 1 tab by mouth daily 5)  Singulair 10 Mg Tabs (Montelukast sodium) .... One by mouth daily 6)  Accupril 10 Mg Tabs (Quinapril hcl) .... Take one tablet daily 7)  Lipitor 20 Mg Tabs (Atorvastatin calcium) .... Take one tablet daily 8)  Depakote 250 Mg Tbec (Divalproex sodium) .... Take one tablet twice a day for 3 days, then increased to 2 tablets twice a day 9)  Provera 10 Mg Tabs (Medroxyprogesterone acetate) .Marland Kitchen.. 1 by mouth once daily on days 14-19 each month to control heavy menses 10)  Ibuprofen 800 Mg Tabs (Ibuprofen) .... Take 1 pill every 8 hours as needed for pain - do not take with meloxicam 11)  Promethazine Hcl 25 Mg/ml Soln (Promethazine hcl) .... Take 1 pill every 6 hours as needed for nausea 12)  Epipen 0.3 Mg/0.48ml Devi (Epinephrine) .... If having allergic reaction with trouble breathing, usep en and go straight to er 13)  Hydrocodone-acetaminophen 5-500 Mg Tabs (Hydrocodone-acetaminophen) .... One q 6 hours as needed for severe pain 14)  Haloperidol 2 Mg Tabs (Haloperidol) .... Take one tablet twice  a day  Patient Instructions: 1)  You have an appointment at the [facility] January 5th @ 8 am with Ethelene Browns 2)  If you need more help prior to this- please contact the [facility] or go to the ER: 3)  Crisis/Emergency Services - [address] [place] OPEN 24 hours/365 days  4)  Call Center - OPEN 24 hours/365 days [phone] Prescriptions: HALOPERIDOL 2 MG TABS (HALOPERIDOL) take one tablet twice a day  #60 x 0   Entered and Authorized by:   Delbert Harness MD   Signed by:   Delbert Harness MD on 12/26/2009   Method used:   Electronically  to        [facility] (retail)       [address]       [place], [place]  [address]       Ph: [phone]       Fax: [phone]   RxID:   [id number]    Orders Added: 1)  [facility]- Est  Level 4 [[address]]

## 2010-02-08 NOTE — Letter (Signed)
Summary: Out of Work  [facility]  [address]   [place], [place] [address]   Phone: [phone]  Fax: [phone]    September 07, 2009   Employee:  SAYANA SAL[employer]    To Whom It May Concern:   For Medical reasons, please excuse the above named employee from work for the following dates:  Start:   09-06-09  End:   09-11-09  If you need additional information, please feel free to contact our office.         Sincerely,    Jamie Brookes MD

## 2010-02-08 NOTE — Miscellaneous (Signed)
Summary: Need written prescriptions for [facility] program  Clinical Lists Changes Patient is in [facility] appt today.  She has an appt with [facility] on August 4th and says she needs written prescriptions to take to this appt in order to be helped.  Otherwise she will need to cancel the appt.  As far as she can tell, she needs prescriptions handwritten for her Lantus Solostar, Fluticasone, Singulair, Meloxicam, Provera and Depakote (or whatever mental health med she eventually gets).  Not sure is this is possible by the 4th but it would help the patient.  Does she need an appt?  Will forward to Dr. Earnest Bailey for her review.

## 2010-02-08 NOTE — Consult Note (Signed)
Summary: [facility]  [facility]   Imported By: Bradly Bienenstock 01/05/2010 12:11:17  _____________________________________________________________________  External Attachment:    Type:   Image     Comment:   External Document

## 2010-02-08 NOTE — Miscellaneous (Signed)
Summary: FMLA  pt dropped off form to be completed, placed on triage desk for any clinical info to be completed.  Knox Royalty  July 31, 2009 2:58 PM    to pcp.Golden Circle RN  July 31, 2009 3:35 PM   Form filled out for patient for treatment of PTSD, mood do, menorrhagia.  I states i expect her to need 3-4 office visits for all appointment per month for the first few months, and shoul dbe able to be spaced out thereafter.  Delbert Harness MD  August 02, 2009 11:07 AM\   faxed [facility] & told pt the original & a copy of hr med list was up front for her to pick up.Golden Circle RN  August 02, 2009 11:20 AM  Patient Instructions: 1)  Current Medications

## 2010-02-08 NOTE — Consult Note (Signed)
Summary: [facility]  [facility]   Imported By: De Nurse 11/02/2009 14:11:00  _____________________________________________________________________  External Attachment:    Type:   Image     Comment:   External Document

## 2010-02-08 NOTE — Consult Note (Signed)
Summary: [facility]  [facility]   Imported By: De Nurse 11/02/2009 14:11:25  _____________________________________________________________________  External Attachment:    Type:   Image     Comment:   External Document

## 2010-02-08 NOTE — Assessment & Plan Note (Signed)
Summary: f/u  kh   Vital Signs:  Patient profile:   40 year old female Height:      60.25 inches Weight:      215 pounds BMI:     41.79 Temp:     98.1 degrees F oral BP sitting:   110 / 78  (left arm) Cuff size:   large  Vitals Entered By: Tessie Fass CMA (November 20, 2009 2:50 PM) CC: F/U Is Patient Diabetic? Yes Pain Assessment Patient in pain? no        Primary Care Provider:  Delbert Harness MD  CC:  F/U.  History of Present Illness: 40 yo here for follow-up  Mood DO: says she never picked up depakote because thehealth department stated they were out of it and is unsure when they will restock.  She continues to have irritation, agitation.  DIABETES Meds: Lantus 45, 5-6 days per week Taking and tolerating? yes, does not take metformin Blood sugars: 5-6 days per week 160 Hypoglycemic symptoms: no Visual problems:  no Monitoring feet: no Numbness/Tingling: no Last eye exam: overdue Last A1c: 7. 7/11 Flu/PNA vaccines?got flu shot, unk pneumovax  HYPERTENSION Meds: Taking and tolerating? yes Home BP's: no Chest Pain: no Dyspnea: no    Current Medications (verified): 1)  Nasonex 50 Mcg/act Susp (Mometasone Furoate) .... Two Sprays Per Nostril Daily 2)  B-4 Med Compression Hose Mens   Misc (Elastic Bandages & Supports) .... Medium Size With 30-75mm Hg of Support To Wear Every Day.  Knee Highs Please.  1 Pair 3)  Lantus Solostar 100 Unit/ml Soln (Insulin Glargine) .... Inject 45 Units Subcutaneously Q Am. Dispense One Box; Increase As Directed By Your Doctor, Qs 4)  Aspirin Adult Low Strength 81 Mg Tbec (Aspirin) .... Take 1 Tab By Mouth Daily 5)  Singulair 10 Mg Tabs (Montelukast Sodium) .... One By Mouth Daily 6)  Accupril 10 Mg Tabs (Quinapril Hcl) .... Take One Tablet Daily 7)  Lipitor 20 Mg Tabs (Atorvastatin Calcium) .... Take One Tablet Daily 8)  Depakote 250 Mg Tbec (Divalproex Sodium) .... Take One Tablet Twice A Day For 3 Days, Then Increased To 2  Tablets Twice A Day 9)  Provera 10 Mg Tabs (Medroxyprogesterone Acetate) .Marland Kitchen.. 1 By Mouth Once Daily On Days 14-19 Each Month To Control Heavy Menses 10)  Ibuprofen 800 Mg Tabs (Ibuprofen) .... Take 1 Pill Every 8 Hours As Needed For Pain - Do Not Take With Meloxicam 11)  Promethazine Hcl 25 Mg/ml Soln (Promethazine Hcl) .... Take 1 Pill Every 6 Hours As Needed For Nausea 12)  Epipen 0.3 Mg/0.62ml Devi (Epinephrine) .... If Having Allergic Reaction With Trouble Breathing, Usep En and Go Straight To Er  Allergies: 1)  ! Benadryl 2)  ! * Allegra D 3)  ! Codeine 4)  ! * Peanuts PMH-FH-SH reviewed-no changes except otherwise noted, PMH-FH-SH reviewed for relevance  Review of Systems      See HPI  Physical Exam  General:  Well-developed,well-nourished,in no acute distress; alert,appropriate and cooperative throughout examination Lungs:  clear to auscultation bilaterally without wheezing, rales, or rhonchi.  Normal work of breathing  Heart:  Regular rate and rhythm without murmur, rub, or gallop.  Normal S1/S2  Psych:  Oriented X3, memory intact for recent and remote, normally interactive, and poor eye contact.     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  Will call again and find alternative tx.   Orders: [facility]- Est  Level 4 (16109)  Problem # 2:  DIABETES MELLITUS, TYPE II, CONTROLLED (ICD-250.00) Worse control.  Advised increasing lantus to 50 units daily and will refer to diabetes education  Her updated medication list for this problem includes:    Lantus Solostar 100 Unit/ml Soln (Insulin glargine) ..... Inject 45 units subcutaneously q am. dispense one box; increase as directed by your doctor, qs    Aspirin Adult Low Strength 81 Mg Tbec (Aspirin) .Marland Kitchen... Take 1 tab by mouth daily    Accupril 10 Mg  Tabs (Quinapril hcl) .Marland Kitchen... Take one tablet daily  Orders: A1C-[facility] (60454) [facility]- Est  Level 4 (09811) Diabetic Clinic Referral (Diabetic)  Labs Reviewed: Creat: 0.86 (07/17/2009)   Microalbumin: 10 (07/17/2009) Reviewed HgBA1c results: 7.9 (11/20/2009)  7.6 (07/07/2009)  Problem # 3:  HYPERTENSION, BENIGN ESSENTIAL (ICD-401.1)  At goal  Her updated medication list for this problem includes:    Accupril 10 Mg Tabs (Quinapril hcl) .Marland Kitchen... Take one tablet daily  BP today: 110/78 Prior BP: 135/95 (11/02/2009)  Labs Reviewed: K+: 4.5 (07/17/2009) Creat: : 0.86 (07/17/2009)   Chol: 177 (07/29/2008)   HDL: 42 (07/29/2008)   LDL: 114 (07/29/2008)   TG: 105 (07/29/2008)  Orders: [facility]- Est  Level 4 (99214)  Complete Medication List: 1)  Nasonex 50 Mcg/act Susp (Mometasone furoate) .... Two sprays per nostril daily 2)  B-4 Med Compression Hose Mens Misc (Elastic bandages & supports) .... Medium size with 30-56mm hg of support to wear every day.  knee highs please.  1 pair 3)  Lantus Solostar 100 Unit/ml Soln (Insulin glargine) .... Inject 45 units subcutaneously q am. dispense one box; increase as directed by your doctor, qs 4)  Aspirin Adult Low Strength 81 Mg Tbec (Aspirin) .... Take 1 tab by mouth daily 5)  Singulair 10 Mg Tabs (Montelukast sodium) .... One by mouth daily 6)  Accupril 10 Mg Tabs (Quinapril hcl) .... Take one tablet daily 7)  Lipitor 20 Mg Tabs (Atorvastatin calcium) .... Take one tablet daily 8)  Depakote 250 Mg Tbec (Divalproex sodium) .... Take one tablet twice a day for 3 days, then increased to 2 tablets twice a day 9)  Provera 10 Mg Tabs (Medroxyprogesterone acetate) .Marland Kitchen.. 1 by mouth once daily on days 14-19 each month to control heavy menses 10)  Ibuprofen 800 Mg Tabs (Ibuprofen) .... Take 1 pill every 8 hours as needed for pain - do not take with meloxicam 11)  Promethazine Hcl 25 Mg/ml Soln (Promethazine hcl) .... Take 1 pill every 6 hours as needed for nausea 12)   Epipen 0.3 Mg/0.12ml Devi (Epinephrine) .... If having allergic reaction with trouble breathing, usep en and go straight to er  Patient Instructions: 1)  Take lantus 50 units every morning 2)  I will refer you to diabetes classes. 3)  I will check in your depakote 4)  Follow-up in 2 weeks    Orders Added: 1)  A1C-[facility] [83036] 2)  [facility]- Est  Level 4 [91478] 3)  Diabetic Clinic Referral [Diabetic]    Laboratory Results   Blood Tests   Date/Time Received: November 20, 2009 2:35 PM  Date/Time Reported: November 20, 2009 2:50 PM   HGBA1C: 7.9%   (Normal Range: Non-Diabetic - 3-6%   Control Diabetic - 6-8%)  Comments: ...........test performed by...........Marland KitchenTerese Door, CMA      Prevention & Chronic Care Immunizations  Influenza vaccine: Fluvax 3+  (10/02/2007)   Influenza vaccine due: 10/01/2008    Tetanus booster: 10/02/2007: Tdap   Tetanus booster due: 10/01/2017    Pneumococcal vaccine: Not documented  Other Screening   Pap smear: Normal  (08/11/2007)   Pap smear action/deferral: PAP smear done  (08/03/2007)   Pap smear due: 08/10/2008    Mammogram: Normal  (08/06/2007)   Mammogram due: 08/05/2008   Smoking status: never  (11/02/2009)  Diabetes Mellitus   HgbA1C: 7.9  (11/20/2009)   Hemoglobin A1C due: 10/27/2007    Eye exam: Not documented    Foot exam: yes  (08/31/2007)   High risk foot: Not documented   Foot care education: Not documented   Foot exam due: 08/30/2008    Urine microalbumin/creatinine ratio: Not documented   Urine microalbumin/cr due: 08/30/2008    Diabetes flowsheet reviewed?: Yes   Progress toward A1C goal: Deteriorated  Lipids   Total Cholesterol: 177  (07/29/2008)   LDL: 114  (07/29/2008)   LDL Direct: Not documented   HDL: 42  (07/29/2008)   Triglycerides: 105  (07/29/2008)    SGOT (AST): 13  (07/17/2009)   SGPT (ALT): <8 U/L  (07/17/2009)   Alkaline phosphatase: 68  (07/17/2009)   Total bilirubin: 0.3   (07/17/2009)  Hypertension   Last Blood Pressure: 110 / 78  (11/20/2009)   Serum creatinine: 0.86  (07/17/2009)   Serum potassium 4.5  (07/17/2009)    Hypertension flowsheet reviewed?: Yes   Progress toward BP goal: At goal  Self-Management Support :   Personal Goals (by the next clinic visit) :     Personal A1C goal: 7  (07/07/2009)     Personal blood pressure goal: 130/80  (07/07/2009)   Patient will work on the following items until the next clinic visit to reach self-care goals:     Medications and monitoring: take my medicines every day  (11/20/2009)    Diabetes self-management support: Referred for self-management class  (11/20/2009)   Referred for diabetes self-mgmt training.    Hypertension self-management support: Not documented    Hypertension self-management support not done because: Good outcomes  (11/20/2009)    Lipid self-management support: Not documented    Appended Document: f/u  kh Called [facility] pharmacy Map program.  They will order the medication once she comes to sign a form there.  They will inform the patient.

## 2010-02-08 NOTE — Progress Notes (Signed)
Summary: [facility]  Phone Note Call from Patient   Caller: Patient Call For: Spero Geralds, Psy.D. Summary of Call: Patient left a VM Friday at 10:58 a.m. but I was out of the office.  I returned her call today and left a VM. Initial call taken by: Spero Geralds PsyD,  October 09, 2009 3:03 PM

## 2010-02-08 NOTE — Assessment & Plan Note (Signed)
Summary: dizziness/? reaction from meds/bmc   Vital Signs:  Patient profile:   40 year old female Weight:      215 pounds Temp:     98.7 degrees F oral Pulse rate:   76 / minute BP sitting:   133 / 90 CC: dizziness Comments metronidazol is making her feel dizzy and making her cbg's go up and down, and she has been shaky. stated that she has had a HA x 1 week   Primary Care Theona Muhs:  Delbert Harness MD  CC:  dizziness.  History of Present Illness: Dizzy: Pt describes feeling "dizzy" which is actually her feeling nauseated. She has been feeling nauseated since she started on the Metronidazole. She missed part of work because of the nausea. SHe has not vomited. She also says that she has been feeling "shakey" which she thought was her blood sugars but they have been fairly normal (177, 220, 113). She has taken 4 days of the medication and will not have any more work (she is off this Labor Day weekend) for the next 3 days. This will give her a chance to finishe her meds. She is comittied to finishing her meds. No fevers, no chills, no vomiting, + headaches x1 week.   Habits & Providers  Alcohol-Tobacco-Diet     Tobacco Status: never  Current Medications (verified): 1)  Nasonex 50 Mcg/act Susp (Mometasone Furoate) .... Two Sprays Per Nostril Daily 2)  B-4 Med Compression Hose Mens   Misc (Elastic Bandages & Supports) .... Medium Size With 30-66mm Hg of Support To Wear Every Day.  Knee Highs Please.  1 Pair 3)  Lantus Solostar 100 Unit/ml Soln (Insulin Glargine) .... Inject 45 Units Subcutaneously Q Am. Dispense One Box; Increase As Directed By Your Doctor, Qs 4)  Aspirin Adult Low Strength 81 Mg Tbec (Aspirin) .... Take 1 Tab By Mouth Daily 5)  Singulair 10 Mg Tabs (Montelukast Sodium) .... One By Mouth Daily 6)  Accupril 10 Mg Tabs (Quinapril Hcl) .... Take One Tablet Daily 7)  Lipitor 20 Mg Tabs (Atorvastatin Calcium) .... Take One Tablet Daily 8)  Depakote 250 Mg Tbec (Divalproex  Sodium) .... Take One Tablet Twice A Day For 3 Days, Then Increased To 2 Tablets Twice A Day 9)  Provera 10 Mg Tabs (Medroxyprogesterone Acetate) .Marland Kitchen.. 1 By Mouth Once Daily On Days 14-19 Each Month To Control Heavy Menses 10)  Meloxicam 15 Mg Tabs (Meloxicam) .... Take 1/2 To 1 Tablet Twice A Day For Pain 11)  Tramadol Hcl 50 Mg Tabs (Tramadol Hcl) .... Take 1-2 Pills Every 6 Hours If Needed For Pain 12)  Ibuprofen 800 Mg Tabs (Ibuprofen) .... Take 1 Pill Every 8 Hours As Needed For Pain - Do Not Take With Meloxicam 13)  Promethazine Hcl 25 Mg/ml Soln (Promethazine Hcl) .... Take 1 Pill Every 6 Hours As Needed For Nausea  Allergies (verified): 1)  ! Benadryl 2)  ! * Allegra D 3)  ! Codeine  Review of Systems        vitals reviewed and pertinent negatives and positives seen in HPI   Physical Exam  General:  Well-developed,well-nourished,in no acute distress; alert,appropriate and cooperative throughout examination, pt does not appear "shakey" but instead is calm Psych:  Cognition and judgment appear intact. Alert and cooperative with normal attention span and concentration. No apparent delusions, illusions, hallucinations   Impression & Recommendations:  Problem # 1:  NAUSEA (ICD-787.02) Assessment New Pt appears to be having nausea associated with the Metronidazole.  Her blood sugars are within a normal range. She is not vomiting. Encouraged pt to fininsh course of med if possible and use Promethazine to help with the nausea. She should be done with the meds but the time she is due to go back to work.   Her updated medication list for this problem includes:    Promethazine Hcl 25 Mg/ml Soln (Promethazine hcl) .Marland Kitchen... Take 1 pill every 6 hours as needed for nausea  Orders: [facility]- Est Level  3 (99213)  Complete Medication List: 1)  Nasonex 50 Mcg/act Susp (Mometasone furoate) .... Two sprays per nostril daily 2)  B-4 Med Compression Hose Mens Misc (Elastic bandages & supports) ....  Medium size with 30-90mm hg of support to wear every day.  knee highs please.  1 pair 3)  Lantus Solostar 100 Unit/ml Soln (Insulin glargine) .... Inject 45 units subcutaneously q am. dispense one box; increase as directed by your doctor, qs 4)  Aspirin Adult Low Strength 81 Mg Tbec (Aspirin) .... Take 1 tab by mouth daily 5)  Singulair 10 Mg Tabs (Montelukast sodium) .... One by mouth daily 6)  Accupril 10 Mg Tabs (Quinapril hcl) .... Take one tablet daily 7)  Lipitor 20 Mg Tabs (Atorvastatin calcium) .... Take one tablet daily 8)  Depakote 250 Mg Tbec (Divalproex sodium) .... Take one tablet twice a day for 3 days, then increased to 2 tablets twice a day 9)  Provera 10 Mg Tabs (Medroxyprogesterone acetate) .Marland Kitchen.. 1 by mouth once daily on days 14-19 each month to control heavy menses 10)  Meloxicam 15 Mg Tabs (Meloxicam) .... Take 1/2 to 1 tablet twice a day for pain 11)  Tramadol Hcl 50 Mg Tabs (Tramadol hcl) .... Take 1-2 pills every 6 hours if needed for pain 12)  Ibuprofen 800 Mg Tabs (Ibuprofen) .... Take 1 pill every 8 hours as needed for pain - do not take with meloxicam 13)  Promethazine Hcl 25 Mg/ml Soln (Promethazine hcl) .... Take 1 pill every 6 hours as needed for nausea  Patient Instructions: 1)  take the rest of the metronidazole 2)  take the promethazine to help with nausea Prescriptions: PROMETHAZINE HCL 25 MG/ML SOLN (PROMETHAZINE HCL) take 1 pill every 6 hours as needed for nausea  #30 x 0   Entered and Authorized by:   Jamie Brookes MD   Signed by:   Jamie Brookes MD on 09/07/2009   Method used:   Electronically to        [facility] (retail)       [address]       [place], [place]  [address]       Ph: [phone]       Fax: [phone]   RxID:   [id number]

## 2010-02-08 NOTE — Assessment & Plan Note (Signed)
Summary: Behavioral Medicine Follow-up   Primary Care Provider:  Delbert Harness MD   History of Present Illness: Margaret Larsen presented with her homework.  She did not have much else on her agenda.  She reported she is not feeling well secondary to pain from her fibroids.  [facility]  She hopes to meet with Dr. Earnest Bailey to discuss options.  Reviewed some of the homework and talked a lot about her mom's passing and the hole that it has left.  Allergies: 1)  ! Benadryl 2)  ! * Allegra D 3)  ! Codeine   Impression & Recommendations:  Problem # 1:  PTSD (ICD-309.81)  Identified avoidance as being a major coping mechanism - from social outings and friendships to making eye contact during our meetings - Lasya is avoiding her life.  A big part of her believes that there is no point in living since her mother isn't here any longer.  She is not suicidal but at present, is choosing to check-out.  She does voice an interest in changing that.  Decided that we would focus on challenging the avoidance.  I will work on the eye contact in our meetings - if she is unwilling to connect with me - a relationship she reports feels safe - the likelihood she will seek connection elsewhere is slim.  She is to generate a list of things that she is avoiding outside of the therapy room.    Of note, she rightly expresses concern about seeking connection with others.  She has been hurt and may be surrounded by a lot of people that are not healthy or do not have her best interest at hurt.  Will explore further next visit.  Scheduled for 8/25th at 9:00.  Would like to see her sooner but her work schedule might preclude.  She will call when she gets it.  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  Orders: Therapy 40-50- min- [facility] (60454)  Complete Medication List: 1)  Fluticasone Propionate 50 Mcg/act Susp  (Fluticasone propionate) .... 2 sprays in each nostril daily 2)  B-4 Med Compression Hose Mens Misc (Elastic bandages & supports) .... Medium size with 30-61mm hg of support to wear every day.  knee highs please.  1 pair 3)  Lantus Solostar 100 Unit/ml Soln (Insulin glargine) .... Inject 45 units subcutaneously q am. dispense one box; increase as directed by your doctor, qs 4)  Aspirin Adult Low Strength 81 Mg Tbec (Aspirin) .... Take 1 tab by mouth daily 5)  Singulair 10 Mg Tabs (Montelukast sodium) .... One by mouth daily 6)  Lisinopril 10 Mg Tabs (Lisinopril) .Marland Kitchen.. 1 tab by mouth daily for blood pressure 7)  Zocor 40 Mg Tabs (Simvastatin) .Marland Kitchen.. 1 tab by mouth daily for cholesterol 8)  Depakote 250 Mg Tbec (Divalproex sodium) .... Take one tablet twice a day for 3 days, then increased to 2 tablets twice a day 9)  Provera 10 Mg Tabs (Medroxyprogesterone acetate) .Marland Kitchen.. 1 by mouth once daily on days 14-19 each month to control heavy menses 10)  Meloxicam 15 Mg Tabs (Meloxicam) .... Take 1/2 to 1 tablet twice a day for pain

## 2010-02-08 NOTE — Assessment & Plan Note (Signed)
Summary: discuss  problems with increased dose of wellbutrin/ls   Vital Signs:  Patient profile:   40 year old female Weight:      219.5 pounds BMI:     42.67 Temp:     98.4 degrees F oral Pulse rate:   82 / minute BP sitting:   139 / 98  (right arm) Cuff size:   regular  Vitals Entered By: Jimmy Footman, CMA (July 13, 2009 9:40 AM) CC: HAving issues with increased dosage of wellbutrin Is Patient Diabetic? Yes Did you bring your meter with you today? No Pain Assessment Patient in pain? yes     Location: head Intensity: 7 Type: sharp Comments Pt. states that increased dosage has caused headaches and issues with eating and sleeping   Primary Care Provider:  . [facility]  CC:  HAving issues with increased dosage of wellbutrin.  History of Present Illness: 40 yo  here to discuss depression meds  Was diagnosed with depression when her mother passed away sveeral years ago.  Was tried on paxil which casused sexual side effects and has been on wellbutrin 300 mg since then with trazodone at night for sleep.  Last week came to office due to increased irritability.  At that time we increased wellbutrin to 400 mg.  Patient noted after that, had increased irritability, difficulty sleeping, decreased appetitie, described fatigue but at times staying up all night to clean the house.  Denies hallucination, SI, HI but does have lots of anger but not directed at anyone or any plans.    Allergies: 1)  ! Benadryl 2)  ! * Allegra D 3)  ! Codeine  Past History:  Past Medical History: Pre-eclampsia in 1996 pregnancy GERD Allergic rhinitis Bipolar DM htn hld      PMH-FH-SH reviewed-no changes except otherwise noted  Review of Systems      See HPI  Physical Exam  General:  Obese.  Poor eye contact.  FIdgety/psychomotor agitation Psych:  Oriented X3, memory intact for recent and remote, cooperative, appropriate.  psychomotor agitation, and poor eye contact.  not suicidal and not  homicidal.     Impression & Recommendations:  Problem # 1:  BIPOLAR DISORDER UNSPECIFIED (ICD-296.80)  I beleive symptoms more likely to be due to uncovered bipolar depression and not medication increased.  MDQ shows 7 yes responses and BSDS score 17+ both indicating high likelyhood of bipolar disorder.  Will taper off wellbutrin and start seroquel. Patient encourage to keep initial appt with Dr. Pascal Lux.  Will follow-up with patient in 2 weeks, monitor symptoms and discuss further titration of seroqel to therapeutic dose.  Orders: [facility]- Est Level  3 ([address])  Complete Medication List: 1)  Fluticasone Propionate 50 Mcg/act Susp (Fluticasone propionate) .... 2 sprays in each nostril daily 2)  B-4 Med Compression Hose Mens Misc (Elastic bandages & supports) .... Medium size with 30-34mm hg of support to wear every day.  knee highs please.  1 pair 3)  Lantus Solostar 100 Unit/ml Soln (Insulin glargine) .... Inject 45 units subcutaneously q am. dispense one box; increase as directed by your doctor, qs 4)  Aspirin Adult Low Strength 81 Mg Tbec (Aspirin) .... Take 1 tab by mouth daily 5)  Singulair 10 Mg Tabs (Montelukast sodium) .... One by mouth daily 6)  Lisinopril 10 Mg Tabs (Lisinopril) .Marland Kitchen.. 1 tab by mouth daily for blood pressure 7)  Zocor 40 Mg Tabs (Simvastatin) .Marland Kitchen.. 1 tab by mouth daily for cholesterol 8)  Diclofenac Sodium  75 Mg Tbec (Diclofenac sodium) .Marland Kitchen.. 1 tab by mouth two times a day as needed pain 9)  Seroquel 50 Mg Tabs (Quetiapine fumarate) .... Take one tablet day 1, then one tablet twice a day x 1 day, then two tablets twice a day  Patient Instructions: 1)  taper wellbutrin over the next week. 300 mg for two days, 200 mg for two days, 100 mg for one day, the stop. 2)  Start seroquel. 3)  Seek medical attention if you notice increased anxiety or suicidal or homicidal thoughts.  Follow-up in 2 weeks    Prescriptions: SEROQUEL 50 MG TABS (QUETIAPINE FUMARATE) take one tablet day 1,  then one tablet twice a day x 1 day, then two tablets twice a day  #60 x 0   Entered and Authorized by:   Delbert Harness MD   Signed by:   Delbert Harness MD on 07/13/2009   Method used:   Faxed to ...       [facility] Department (retail)       [address] [place], [place]  [address]       Ph: [phone]       Fax: [phone]   RxID:   [id number]

## 2010-02-08 NOTE — Letter (Signed)
Summary: MAP Program Communication  [facility]  [address]   [place], [place] [address]   Phone: [phone]  Fax: [phone]    08/17/2009  08/17/2009  To Nash Mantis @ Medication Assistance Program regarding: Margaret Larsen  In response to your medicatiom review: you may substitute accupril for lisinopril.  Prescription attached. You may substitue Lipitor 20 for Simvastatin 40.  Prescription attached. You may substitute Nasonex for flonase [name] is no longer taking zoloft as her diagnosis has changed from Major Depression to Bipolar Depression.  Carbamazepine was discontinued due to side effects.  I would like to continue her depakote as prescribed and will keep seroquel in mind if we need to change in the future now that it has become an option with the MAP. My records indicate that she is no longer taking trazodone. I did not receive a copy of medical information request sheet.    Sincerely,   Delbert Harness MD

## 2010-02-08 NOTE — Progress Notes (Signed)
Summary: Rx Prob  Phone Note Call from Patient Call back at [facility] Phone [phone]   Caller: Patient Summary of Call: The rx she was given today the health dept does not have and she can not afford it.  Something that the health dept carries needs to be sent in for her.   Initial call taken by: Clydell Hakim,  July 13, 2009 12:08 PM  Follow-up for Phone Call        will forward to Dr. Earnest Bailey. Follow-up by: Theresia Lo RN,  July 13, 2009 12:25 PM  Additional Follow-up for Phone Call Additional follow up Details #1::        please let patient know that I will need to consult with ourspecialists to try to find other medications or medication assistance for her.  I will call back once I am able to get an answer Additional Follow-up by: Delbert Harness MD,  July 13, 2009 1:33 PM    Additional Follow-up for Phone Call Additional follow up Details #2::    message left on voicemail. Follow-up by: Theresia Lo RN,  July 13, 2009 3:49 PM  Additional Follow-up for Phone Call Additional follow up Details #3:: Details for Additional Follow-up Action Taken: Pt notified of Dr. Leonie Green message. Additional Follow-up by: Clydell Hakim,  July 13, 2009 3:54 PM  New/Updated Medications: CARBAMAZEPINE 200 MG TABS (CARBAMAZEPINE) take one tablet twice a day Prescriptions: CARBAMAZEPINE 200 MG TABS (CARBAMAZEPINE) take one tablet twice a day  #60 x 0   Entered and Authorized by:   Delbert Harness MD   Signed by:   Delbert Harness MD on 07/14/2009   Method used:   Faxed to ...       [facility] Department (retail)       [address] [place], [place]  [address]       Ph: [phone]       Fax: [phone]   RxID:   [id number]  discussed with patient option of depakote- $60 for 60 250 mg tabs at [facility] is not affordable for her.  Agreeable to starting carbamazepeine and close bloodwork monitoring.  WIll come to get baseline blood draw on monday.  Delbert Harness MD  July 14, 2009  5:47 PM

## 2010-02-08 NOTE — Progress Notes (Signed)
Summary: Schedule Initial [facility]  Phone Note Call from Patient   Caller: Patient Call For: Spero Geralds, Psy.D. Summary of Call: Patient called to schedule an appt.  Dr. Lafonda Mosses referred her a long time ago and she is calling now.  Scheduled for first available which was 7.25.11 at 9:00. Initial call taken by: Spero Geralds PsyD,  July 03, 2009 4:46 PM

## 2010-02-08 NOTE — Progress Notes (Signed)
Summary: insurance question  Phone Note Outgoing Call   Call placed by: Loralee Pacas CMA,  December 25, 2009 4:52 PM Summary of Call: called pt to find out if she has a different insurance other than the one we have  Follow-up for Phone Call        pt called back and only has [place] Follow-up by: Loralee Pacas CMA,  December 26, 2009 11:58 AM

## 2010-02-08 NOTE — Assessment & Plan Note (Signed)
Summary: headache, numbness top of right arm around neck and to left a...    Vital Signs:  Patient profile:   40 year old female Height:      60.25 inches Weight:      224.2 pounds BMI:     43.58 Temp:     98.2 degrees F oral Pulse rate:   80 / minute BP sitting:   122 / 88  (left arm) Cuff size:   large  Vitals Entered By: Jimmy Footman, CMA (December 21, 2009 4:02 PM) CC: HA x2 weeks, Numbness upper body Is Patient Diabetic? Yes Did you bring your meter with you today? No   Primary Provider:  Delbert Harness MD  CC:  HA x2 weeks and Numbness upper body.  History of Present Illness: 40 yo starting treatment for bipolar disorder- started depakote about 2.5 weeks ago- Nov 21 per patient  many symptoms: brings in paper with a list  forgetful: forgets name of acquanintences, for about 1 month, started before depakkote started- no different.    Slurred speech:  what she means is she is tlking slowly- "friends tell me it takes a long time to get to the point of a story"  Started after depakote, worsening  right flank pain:  1 week in duration, was dx as possible kidney stone.  Hydrocodone did not help much.  Did not strain urine but did not notice passing of stone.  Pain is intermittant, sometimes releived, sometimes severe, "walks it out"  worse when she lays on that side.    frequent urination: x 1 week, same onset as flank pain.  no dysuria or hematuria.  also with polyuria but now not as much urine.  right arm and shoulder, body and right leg numbness:  "onset months ago" numb with pins and needles "have to shake it out"   no weakness  sudden bowel movements:  1 week ago onset with flank pain- no trigger foods, happens with every meal so she is avoiding eating.  soft but not diarrhea.  No leakage or incontinence of feces.  Dizzy: unsteady,leaning  but not vertigo, occ nausea but not always associated with dizziness.  No falls  Having vivid dreams and may also have had  hallucination of a person sitting above her door which was frightening to her and she realizes it is not real.  No SI, HI.   Habits & Providers  Alcohol-Tobacco-Diet     Tobacco Status: never  Current Medications (verified): 1)  Nasonex 50 Mcg/act Susp (Mometasone Furoate) .... Two Sprays Per Nostril Daily 2)  B-4 Med Compression Hose Mens   Misc (Elastic Bandages & Supports) .... Medium Size With 30-67mm Hg of Support To Wear Every Day.  Knee Highs Please.  1 Pair 3)  Lantus Solostar 100 Unit/ml Soln (Insulin Glargine) .... Inject 50 Units Subcutaneously Q Am.  Increase As Directed By Your Doctor.  Dispense Qs 1 Month 4)  Aspirin Adult Low Strength 81 Mg Tbec (Aspirin) .... Take 1 Tab By Mouth Daily 5)  Singulair 10 Mg Tabs (Montelukast Sodium) .... One By Mouth Daily 6)  Accupril 10 Mg Tabs (Quinapril Hcl) .... Take One Tablet Daily 7)  Lipitor 20 Mg Tabs (Atorvastatin Calcium) .... Take One Tablet Daily 8)  Depakote 250 Mg Tbec (Divalproex Sodium) .... Take One Tablet Twice A Day For 3 Days, Then Increased To 2 Tablets Twice A Day 9)  Provera 10 Mg Tabs (Medroxyprogesterone Acetate) .Marland Kitchen.. 1 By Mouth Once Daily On Days 14-19  Each Month To Control Heavy Menses 10)  Ibuprofen 800 Mg Tabs (Ibuprofen) .... Take 1 Pill Every 8 Hours As Needed For Pain - Do Not Take With Meloxicam 11)  Promethazine Hcl 25 Mg/ml Soln (Promethazine Hcl) .... Take 1 Pill Every 6 Hours As Needed For Nausea 12)  Epipen 0.3 Mg/0.17ml Devi (Epinephrine) .... If Having Allergic Reaction With Trouble Breathing, Usep En and Go Straight To Er 13)  Hydrocodone-Acetaminophen 5-500 Mg Tabs (Hydrocodone-Acetaminophen) .... One Q 6 Hours As Needed For Severe Pain  Allergies: 1)  ! Benadryl 2)  ! * Allegra D 3)  ! Codeine 4)  ! * Peanuts PMH-FH-SH reviewed for relevance  Review of Systems      See HPI  Physical Exam  General:  alert, in no acute distress Neurologic:  alert & oriented X3, cranial nerves II-XII intact,  gait normal, and DTRs symmetrical and normal.  Sensation to light touch dimished throughout  lateral side of left leg Psych:  Oriented X3 and memory intact for recent and remote.  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  Symptoms not consistant with toxicity.  Will check levels to confirm this.  Will refer to psychiatry.  Advised to continue taking depakote at current level, will follow-up in 1-2 weeks.  Orders: Valproic Acid-[facility] ([id number]) [facility]- Est Level  3 ([address]) Psychiatric Referral (Psych)  Complete Medication List: 1)  Nasonex 50 Mcg/act Susp (Mometasone furoate) .... Two sprays per nostril daily 2)  B-4 Med Compression Hose Mens Misc (Elastic bandages & supports) .... Medium size with 30-73mm hg of support to wear every day.  knee highs please.  1 pair 3)  Lantus Solostar 100 Unit/ml Soln (Insulin glargine) .... Inject 50 units subcutaneously q am.  increase as directed by your doctor.  dispense qs 1 month 4)  Aspirin Adult Low Strength 81 Mg Tbec (Aspirin) .... Take 1 tab by mouth daily 5)  Singulair 10 Mg Tabs (Montelukast sodium) .... One by mouth daily 6)  Accupril 10 Mg Tabs (Quinapril hcl) .... Take one tablet daily 7)  Lipitor 20 Mg Tabs (Atorvastatin calcium) .... Take one tablet daily 8)  Depakote 250 Mg Tbec (Divalproex sodium) .... Take one tablet twice a day for 3 days, then increased to 2 tablets twice a day 9)  Provera 10 Mg Tabs (Medroxyprogesterone acetate) .Marland Kitchen.. 1 by mouth once daily on days 14-19 each month to control heavy menses 10)  Ibuprofen 800 Mg Tabs (Ibuprofen) .... Take 1 pill every 8 hours as needed for pain - do not take with meloxicam 11)  Promethazine Hcl 25 Mg/ml Soln (Promethazine hcl) .... Take 1 pill every 6 hours as  needed for nausea 12)  Epipen 0.3 Mg/0.68ml Devi (Epinephrine) .... If having allergic reaction with trouble breathing, usep en and go straight to er 13)  Hydrocodone-acetaminophen 5-500 Mg Tabs (Hydrocodone-acetaminophen) .... One q 6 hours as needed for severe pain  Patient Instructions: 1)  keep same dose of depakote 2)  I think your symptoms are a combination of your bipolar depression and your body getting adjusted to depakote 3)  follow-up with me in 1-2 weeks   Orders Added: 1)  Valproic Acid-[facility] [[id number]] 2)  [facility]- Est Level  3 [[address]] 3)  Psychiatric Referral [Psych]

## 2010-02-08 NOTE — Progress Notes (Signed)
Summary: meds question  Phone Note Call from Patient Call back at Home Phone [phone]   Caller: Patient Summary of Call: has a question about CARBAMAZEPINE 200 MG TABS  Initial call taken by: De Nurse,  July 17, 2009 11:30 AM  Follow-up for Phone Call        she was given it for bipolar. states the package insert referenced it being used for seizures. told her this drug is useful for more than one thing. take as directed. it may take a while for her to notice a difference. she agreed to take Follow-up by: Golden Circle RN,  July 17, 2009 11:38 AM  Additional Follow-up for Phone Call Additional follow up Details #1::        Agreed.  thanks Additional Follow-up by: Delbert Harness MD,  July 17, 2009 12:56 PM

## 2010-02-08 NOTE — Assessment & Plan Note (Signed)
Summary: lump side of L neck/[place]   Vital Signs:  Patient profile:   40 year old female Weight:      219.6 pounds Temp:     98.4 degrees F Pulse rate:   81 / minute BP sitting:   125 / 83  (left arm)  Vitals Entered By: Starleen Blue RN (January 11, 2009 10:58 AM) CC: lump L side neck Is Patient Diabetic? Yes Pain Assessment Patient in pain? no        Primary Care Provider:  Asher Muir MD  CC:  lump L side neck.  History of Present Illness: cc: lump on side  40y/o F here for work-in appt for left "lump" on neck that appeared last night.  Pt was cleaning her face when she felt the lump.  Denies tenderness.  No fever, chills, recent sick symptoms, sores in mouth, weigth loss, cough, running nose, HAs.  Family history pertinent for mother with Hodgskins lymphoma.     Habits & Providers  Alcohol-Tobacco-Diet     Tobacco Status: never  Allergies: 1)  ! Benadryl 2)  ! * Allegra D 3)  ! Codeine  Physical Exam  General:  Well-developed,well-nourished,in no acute distress; alert,appropriate and cooperative throughout examination. Vitals reviewed. Head:  normocephalic, atraumatic, no abnormalities observed, and no abnormalities palpated.   Ears:  Left ear with piercing done about 1 month ago Neck:  Supple.  Full ROM.  Left neck in area between ear and jaw, posterior to tmj with nodular mass size of 1 cm in width and 1.5 cm in length. nontender. non mobile.  feels solid.  no swelling.  no redness.  does not feel cystic.   Cervical Nodes:  No lymphadenopathy noted.  Axillary Nodes:  No palpable lymphadenopathy   Impression & Recommendations:  Problem # 1:  CERVICAL LYMPHADENOPATHY, LEFT (ICD-785.6) Assessment New Left Cervical lymphadenopathy most likely lymph node in etiology.  Source of infection may be ear piercing that is about 34 month old.  Advised pt that she can remove the piercing.  Serious source of lymph node may be cancer (lymphoma).  We discussed  this today.  Pt to monitor for increase in size.  Pt to rtc in 1 month, or sooner if bigger, for reevaluation.  If lymph node is still present or bigger, most likely will need biopsy to rule out cancer.  If it becomes tender pt should come back for antibiotics.    Complete Medication List: 1)  Fluticasone Propionate 50 Mcg/act Susp (Fluticasone propionate) .... 2 sprays in each nostril daily 2)  B-4 Med Compression Hose Mens Misc (Elastic bandages & supports) .... Medium size with 30-68mm hg of support to wear every day.  knee highs please.  1 pair 3)  Lantus Solostar 100 Unit/ml Soln (Insulin glargine) .... Inject 45 units subcutaneously q am. dispense one box; increase as directed by your doctor, qs 4)  Aspirin Adult Low Strength 81 Mg Tbec (Aspirin) .... Take 1 tab by mouth daily 5)  Singulair 10 Mg Tabs (Montelukast sodium) .... One by mouth daily 6)  Lisinopril 10 Mg Tabs (Lisinopril) .Marland Kitchen.. 1 tab by mouth daily for blood pressure 7)  Wellbutrin 100 Mg Tabs (Bupropion hcl) .... Take 1 tab by mouth three times a day 8)  Zocor 40 Mg Tabs (Simvastatin) .Marland Kitchen.. 1 tab by mouth daily for cholesterol 9)  Diclofenac Sodium 75 Mg Tbec (Diclofenac sodium) .Marland Kitchen.. 1 tab by mouth two times a day as needed pain 10)  Trazodone Hcl 50  Mg Tabs (Trazodone hcl) .Marland Kitchen.. 1 tab by mouth at bedtime as needed for sleep  Other Orders: [facility]- Est Level  3 ([address])  Patient Instructions: 1)  Please schedule a follow-up appointment in 1 month for lymph node check, sooner if it is bigger or becomes painful.  2)  Your lump may be due to infection from your ear piercing.  You can remove this piercing to stop the infection. 3)  If it becomes painful you may need antibiotic.

## 2010-02-08 NOTE — Miscellaneous (Signed)
Summary: Lantus refill  Clinical Lists Changes  Medications: Changed medication from LANTUS SOLOSTAR 100 UNIT/ML SOLN (INSULIN GLARGINE) inject 45 units subcutaneously q am. dispense one box; increase as directed by your doctor, QS [BMN] to LANTUS SOLOSTAR 100 UNIT/ML SOLN (INSULIN GLARGINE) inject 50 units subcutaneously q am.  increase as directed by your doctor.  Dispense QS 1 month [BMN] - Signed Rx of LANTUS SOLOSTAR 100 UNIT/ML SOLN (INSULIN GLARGINE) inject 50 units subcutaneously q am.  increase as directed by your doctor.  Dispense QS 1 month;  #1 x 5 Brand medically necessary;  Signed;  Entered by: Delbert Harness MD;  Authorized by: Delbert Harness MD;  Method used: Faxed to [facility], [address] [place], [place], [place]  [address], Ph: [phone], Fax: [phone]    Prescriptions: LANTUS SOLOSTAR 100 UNIT/ML SOLN (INSULIN GLARGINE) inject 50 units subcutaneously q am.  increase as directed by your doctor.  Dispense QS 1 month Brand medically necessary #1 x 5   Entered and Authorized by:   Delbert Harness MD   Signed by:   Delbert Harness MD on 12/21/2009   Method used:   Faxed to ...       [facility] Department (retail)       [address] [place], [place]  [address]       Ph: [phone]       Fax: [phone]   RxID:   [id number]

## 2010-02-08 NOTE — Assessment & Plan Note (Signed)
Summary: Behavioral Medicine Follow-up   Primary Care [name]:  Delbert Harness MD   History of Present Illness: Margaret Larsen presented about 12 minutes late for her appt.  She shared that she moved last Wednesday to a house with a front and back yard.  Her kids can play outside now and ride their bikes.  She is pleased with the move.  The move kept her from doing her homework (walking to daughter's house on Tuesday evenings) but did keep her out of her bedroom.  She is starting to head there again in her new home.  She has been making an effort to spend more time in the common areas.  Allergies: 1)  ! Benadryl 2)  ! * Allegra D 3)  ! Codeine   Impression & Recommendations:  Problem # 1:  PTSD (ICD-309.81) Affect wnl.  Report of mood is euthymic.  She reports some sad days but not every day.  The loss of her mother resurfaced again today.  Provided some homework for her to write down the various areas this has impacted.  Will follow up with that next visit.  She had the choice between an appt in six days or one in early October.  She elected the later one.  She can call in between if need be. Orders: Therapy 20-30 min- [facility] ([address])  Complete Medication List: 1)  Nasonex 50 Mcg/act Susp (Mometasone furoate) .... Two sprays per nostril daily 2)  B-4 Med Compression Hose Mens Misc (Elastic bandages & supports) .... Medium size with 30-34mm hg of support to wear every day.  knee highs please.  1 pair 3)  Lantus Solostar 100 Unit/ml Soln (Insulin glargine) .... Inject 45 units subcutaneously q am. dispense one box; increase as directed by your doctor, qs 4)  Aspirin Adult Low Strength 81 Mg Tbec (Aspirin) .... Take 1 tab by mouth daily 5)  Singulair 10 Mg Tabs (Montelukast sodium) .... One by mouth daily 6)  Accupril 10 Mg Tabs (Quinapril hcl) .... Take one tablet daily 7)  Lipitor 20 Mg Tabs (Atorvastatin calcium) .... Take one tablet daily 8)  Depakote 250 Mg Tbec (Divalproex sodium) .... Take one  tablet twice a day for 3 days, then increased to 2 tablets twice a day 9)  Provera 10 Mg Tabs (Medroxyprogesterone acetate) .Marland Kitchen.. 1 by mouth once daily on days 14-19 each month to control heavy menses 10)  Meloxicam 15 Mg Tabs (Meloxicam) .... Take 1/2 to 1 tablet twice a day for pain 11)  Tramadol Hcl 50 Mg Tabs (Tramadol hcl) .... Take 1-2 pills every 6 hours if needed for pain 12)  Ibuprofen 800 Mg Tabs (Ibuprofen) .... Take 1 pill every 8 hours as needed for pain - do not take with meloxicam 13)  Promethazine Hcl 25 Mg/ml Soln (Promethazine hcl) .... Take 1 pill every 6 hours as needed for nausea

## 2010-02-08 NOTE — Progress Notes (Signed)
Summary: phone pmsg  Phone Note Call from Patient Call back at Home Phone [phone]   Caller: Patient Summary of Call: pt has a question about pains meds and wants to know if she can take extra Initial call taken by: De Nurse,  August 16, 2009 10:12 AM  Follow-up for Phone Call        taking meloxicam. told her to not take take extra. pain in pelvic area, R leg & L arm. advised seeing a doctor. she is off until 3 today & wants to be seen asap. placed in work in schedule Follow-up by: Golden Circle RN,  August 16, 2009 10:23 AM

## 2010-02-08 NOTE — Assessment & Plan Note (Signed)
Summary: f/up depression,tcb   Vital Signs:  Patient profile:   40 year old female Height:      60.25 inches Weight:      221.9 pounds BMI:     43.13 Temp:     98.2 degrees F oral Pulse rate:   86 / minute BP sitting:   125 / 83  (left arm) Cuff size:   regular  Vitals Entered By: Gladstone Pih (July 07, 2009 2:45 PM) CC: F/U DM,med refills Is Patient Diabetic? Yes Did you bring your meter with you today? No Pain Assessment Patient in pain? no        Primary Care Provider:  . [facility]  CC:  F/U DM and med refills.  History of Present Illness: 40 yo here to discuss depression and DM  depression:  Has been on wellbutrin for several years.  Also takes trazodone at night.  Has recenetly been noting more irritability stating "I feel like I want to hurt people."  When I inquire further she states not aimed at anyone specifically or has no plan in mind.  Also increasing tearfullness.  No SI,HI or history of.  She thinks initially her depression started when her mother died several years ago and due to "relationship problems" which she would not discuss in further detail, she has been worrying more about he mother.  No delusions, hallucinations.  DIABETES Meds:Lantus- has not been taking.  Was tried on metformin in past but too much Gi upset Taking and tolerating? see above. Blood sugars: not checking regularly  HYPERTENSION Meds: Taking and tolerating? has not beent aking any of her meds. Home BP's: no    Habits & Providers  Alcohol-Tobacco-Diet     Tobacco Status: never  Current Medications (verified): 1)  Fluticasone Propionate 50 Mcg/act Susp (Fluticasone Propionate) .... 2 Sprays in Each Nostril Daily 2)  B-4 Med Compression Hose Mens   Misc (Elastic Bandages & Supports) .... Medium Size With 30-38mm Hg of Support To Wear Every Day.  Knee Highs Please.  1 Pair 3)  Lantus Solostar 100 Unit/ml Soln (Insulin Glargine) .... Inject 45 Units Subcutaneously Q Am.  Dispense One Box; Increase As Directed By Your Doctor, Qs 4)  Aspirin Adult Low Strength 81 Mg Tbec (Aspirin) .... Take 1 Tab By Mouth Daily 5)  Singulair 10 Mg Tabs (Montelukast Sodium) .... One By Mouth Daily 6)  Lisinopril 10 Mg Tabs (Lisinopril) .Marland Kitchen.. 1 Tab By Mouth Daily For Blood Pressure 7)  Bupropion Hcl 100 Mg Tabs (Bupropion Hcl) .... 2 Tabs Twice Daily 8)  Zocor 40 Mg Tabs (Simvastatin) .Marland Kitchen.. 1 Tab By Mouth Daily For Cholesterol 9)  Diclofenac Sodium 75 Mg Tbec (Diclofenac Sodium) .Marland Kitchen.. 1 Tab By Mouth Two Times A Day As Needed Pain 10)  Trazodone Hcl 50 Mg Tabs (Trazodone Hcl) .Marland Kitchen.. 1 Tab By Mouth At Bedtime As Needed For Sleep  Allergies: 1)  ! Benadryl 2)  ! * Allegra D 3)  ! Codeine PMH-FH-SH reviewed-no changes except otherwise noted  Review of Systems      See HPI  Physical Exam  General:  Well-developed,well-nourished,in no acute distress; alert,appropriate and cooperative throughout examination. Vitals reviewed. Psych:  Oriented X3, memory intact for recent and remote, subdued, and poor eye contact.     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  no SI/HI.  Contracts for  safety.  Patient has shown good initiative and already scheduled appt with Dr. Pascal Lux.  WIll follow up in 2-3 weeks after her appt with Dr. Pascal Lux  Orders: [facility]- Est  Level 4 ([address])  Problem # 2:  DIABETES MELLITUS, TYPE II, CONTROLLED (ICD-250.00) Hga1c worsened.  Patient admits to not taking Lantus.  Explored why she is not on orals- intolerance to metformin and amaryl in past.  Advised to restart lantus and will address other aspects of diabetic care at next visit.  Her updated medication list for this problem includes:    Lantus Solostar 100 Unit/ml Soln (Insulin glargine) ..... Inject 45 units subcutaneously q am. dispense one box; increase as directed by your doctor,  qs    Aspirin Adult Low Strength 81 Mg Tbec (Aspirin) .Marland Kitchen... Take 1 tab by mouth daily    Lisinopril 10 Mg Tabs (Lisinopril) .Marland Kitchen... 1 tab by mouth daily for blood pressure  Orders: A1C-[facility] ([address]) [facility]- Est  Level 4 ([address])Future Orders: Comp Met-[facility] ([id number]) ... 07/20/2010 UA Microalbumin-[facility] ([id number]) ... 07/21/2010  Labs Reviewed: Creat: 0.80 (07/29/2008)   Microalbumin: 1+ (08/31/2007) Reviewed HgBA1c results: 7.6 (07/07/2009)  7.0 (07/29/2008)  Problem # 3:  HYPERTENSION, BENIGN ESSENTIAL (ICD-401.1)  not taking medications.  BP at goal.  Ok to hold for now.  will readdress at next visit.  Her updated medication list for this problem includes:    Lisinopril 10 Mg Tabs (Lisinopril) .Marland Kitchen... 1 tab by mouth daily for blood pressure  Orders: [facility]- Est  Level 4 ([address])Future Orders: Comp Met-[facility] ([id number]) ... 07/20/2010  Problem # 4:  DYSLIPIDEMIA (ICD-272.4)  not taking meds.  patient prefer to stop.  Will check FLP and readdress at enxt visit.  Her updated medication list for this problem includes:    Zocor 40 Mg Tabs (Simvastatin) .Marland Kitchen... 1 tab by mouth daily for cholesterol  Orders: [facility]- Est  Level 4 ([address])Future Orders: Comp Met-[facility] ([id number]) ... 07/20/2010 Lipid-[facility] ([id number]) ... 07/20/2010  Complete Medication List: 1)  Fluticasone Propionate 50 Mcg/act Susp (Fluticasone propionate) .... 2 sprays in each nostril daily 2)  B-4 Med Compression Hose Mens Misc (Elastic bandages & supports) .... Medium size with 30-60mm hg of support to wear every day.  knee highs please.  1 pair 3)  Lantus Solostar 100 Unit/ml Soln (Insulin glargine) .... Inject 45 units subcutaneously q am. dispense one box; increase as directed by your doctor, qs 4)  Aspirin Adult Low Strength 81 Mg Tbec (Aspirin) .... Take 1 tab by mouth daily 5)  Singulair 10 Mg Tabs (Montelukast sodium) .... One by mouth daily 6)  Lisinopril 10 Mg Tabs (Lisinopril) .Marland Kitchen.. 1 tab by mouth daily for blood  pressure 7)  Bupropion Hcl 100 Mg Tabs (Bupropion hcl) .... 2 tabs twice daily 8)  Zocor 40 Mg Tabs (Simvastatin) .Marland Kitchen.. 1 tab by mouth daily for cholesterol 9)  Diclofenac Sodium 75 Mg Tbec (Diclofenac sodium) .Marland Kitchen.. 1 tab by mouth two times a day as needed pain 10)  Trazodone Hcl 50 Mg Tabs (Trazodone hcl) .Marland Kitchen.. 1 tab by mouth at bedtime as needed for sleep  Patient Instructions: 1)  You have done a great job in asking for help!  Please keep appt with Dr. Pascal Lux. 2)  Make appt with lab for fasting bloodwork (no food in 8 hours). 3)  Make follow-up appt with me for end of the month sometime after your appt with Dr. Pascal Lux. 4)  retsart taking your lantus. 5)  If you have anger or sadness that you  cannot control, please call 911 or go to ER. Prescriptions: BUPROPION HCL 100 MG TABS (BUPROPION HCL) 2 tabs twice daily  #120 x 0   Entered and Authorized by:   Delbert Harness MD   Signed by:   Delbert Harness MD on 07/07/2009   Method used:   Faxed to ...       [facility] Department (retail)       [address] [place], [place]  [address]       Ph: [phone]       Fax: [phone]   RxID:   [id number] LANTUS SOLOSTAR 100 UNIT/ML SOLN (INSULIN GLARGINE) inject 45 units subcutaneously q am. dispense one box; increase as directed by your doctor, QS Brand medically necessary #1 x 11   Entered and Authorized by:   Delbert Harness MD   Signed by:   Delbert Harness MD on 07/07/2009   Method used:   Faxed to ...       [facility] Department (retail)       [address] [place], [place]  [address]       Ph: [phone]       Fax: [phone]   RxID:   [id number] TRAZODONE HCL 50 MG TABS (TRAZODONE HCL) 1 tab by mouth at bedtime as needed for sleep  #30 x 6   Entered and Authorized by:   Delbert Harness MD   Signed by:   Delbert Harness MD on 07/07/2009   Method used:   Faxed to ...       [facility] Department (retail)       [address] [place], [place]  [address]       Ph: [phone]       Fax: [phone]   RxID:   [id number]    Laboratory Results   Blood Tests   Date/Time Received: July 07, 2009 2:56 PM  Date/Time Reported: July 07, 2009 3:10 PM   HGBA1C: 7.6%   (Normal Range: Non-Diabetic - 3-6%   Control Diabetic - 6-8%)  Comments: ...............test performed by......Marland KitchenBonnie A. [place], MLS (ASCP)cm      Prevention & Chronic Care Immunizations   Influenza vaccine: Fluvax 3+  (10/02/2007)   Influenza vaccine due: 10/01/2008    Tetanus booster: 10/02/2007: Tdap   Tetanus booster due: 10/01/2017    Pneumococcal vaccine: Not documented  Other Screening   Pap smear: Normal  (08/11/2007)   Pap smear action/deferral: PAP smear done  (08/03/2007)   Pap smear due: 08/10/2008    Mammogram: Normal  (08/06/2007)   Mammogram due: 08/05/2008   Smoking status: never  (07/07/2009)  Diabetes Mellitus   HgbA1C: 7.6  (07/07/2009)   Hemoglobin A1C due: 10/27/2007    Eye exam: Not documented    Foot exam: yes  (08/31/2007)   High risk foot: Not documented   Foot care education: Not documented   Foot exam due: 08/30/2008    Urine microalbumin/creatinine ratio: Not documented   Urine microalbumin/cr due: 08/30/2008    Diabetes flowsheet reviewed?: Yes   Progress toward A1C goal: Deteriorated  Lipids   Total Cholesterol: 177  (07/29/2008)   LDL: 114  (07/29/2008)   LDL Direct: Not documented   HDL: 42  (07/29/2008)   Triglycerides: 105  (07/29/2008)    SGOT (AST): 15  (07/29/2008)   SGPT (ALT): 8  (07/29/2008) CMP ordered  Alkaline phosphatase: 74  (07/29/2008)   Total bilirubin: 0.2  (07/29/2008)    Lipid flowsheet reviewed?: Yes   Progress toward LDL goal: Unchanged  Hypertension   Last Blood Pressure: 125 / 83  (07/07/2009)   Serum creatinine: 0.80  (07/29/2008)   Serum potassium 4.5  (07/29/2008) CMP ordered     Hypertension flowsheet reviewed?: Yes   Progress toward BP goal:  At goal  Self-Management Support :   Personal Goals (by the next clinic visit) :     Personal A1C goal: 7  (07/07/2009)     Personal blood pressure goal: 130/80  (07/07/2009)   Patient will work on the following items until the next clinic visit to reach self-care goals:     Medications and monitoring: take my medicines every day  (07/07/2009)    Diabetes self-management support: Not documented    Hypertension self-management support: Not documented    Lipid self-management support: Not documented

## 2010-02-08 NOTE — Miscellaneous (Signed)
Summary: prior auth  Clinical Lists Changes prior auth for singulair to pcp.Golden Circle RN  April 05, 2009 9:43 AM  see phone note

## 2010-02-08 NOTE — Assessment & Plan Note (Signed)
Summary: F/U VISIT/[facility]   Vital Signs:  Patient profile:   40 year old female Height:      60.25 inches Weight:      219.5 pounds BMI:     42.67 Temp:     98.1 degrees F oral Pulse rate:   124 / minute BP sitting:   123 / 89  (left arm) Cuff size:   large  Vitals Entered By: Jimmy Footman, CMA (December 11, 2009 2:46 PM) CC: follow up Is Patient Diabetic? Yes Did you bring your meter with you today? No Pain Assessment Patient in pain? no        Primary Care Provider:  Delbert Harness MD  CC:  follow up.  History of Present Illness: 40 yo here for follow-up  bipolar do:  reports much iporved mood.  Less irritability, friends and coworkers have made comment.  No side effects.  daytime sleepiness:  even prior to Depakote.  Epworth sleepiness scale most questions answered moderate to high chance of dozing.  NO report of snoring but does not have bed partners.  DIABETES Meds: lantus 45 Taking and tolerating? yes Blood sugars: no log Hypoglycemic symptoms: no Visual problems: no Monitoring feet: yes Numbness/Tingling: no Last eye exam: > 1 year    Habits & Providers  Alcohol-Tobacco-Diet     Tobacco Status: never  Current Medications (verified): 1)  Nasonex 50 Mcg/act Susp (Mometasone Furoate) .... Two Sprays Per Nostril Daily 2)  B-4 Med Compression Hose Mens   Misc (Elastic Bandages & Supports) .... Medium Size With 30-57mm Hg of Support To Wear Every Day.  Knee Highs Please.  1 Pair 3)  Lantus Solostar 100 Unit/ml Soln (Insulin Glargine) .... Inject 45 Units Subcutaneously Q Am. Dispense One Box; Increase As Directed By Your Doctor, Qs 4)  Aspirin Adult Low Strength 81 Mg Tbec (Aspirin) .... Take 1 Tab By Mouth Daily 5)  Singulair 10 Mg Tabs (Montelukast Sodium) .... One By Mouth Daily 6)  Accupril 10 Mg Tabs (Quinapril Hcl) .... Take One Tablet Daily 7)  Lipitor 20 Mg Tabs (Atorvastatin Calcium) .... Take One Tablet Daily 8)  Depakote 250 Mg Tbec (Divalproex Sodium)  .... Take One Tablet Twice A Day For 3 Days, Then Increased To 2 Tablets Twice A Day 9)  Provera 10 Mg Tabs (Medroxyprogesterone Acetate) .Marland Kitchen.. 1 By Mouth Once Daily On Days 14-19 Each Month To Control Heavy Menses 10)  Ibuprofen 800 Mg Tabs (Ibuprofen) .... Take 1 Pill Every 8 Hours As Needed For Pain - Do Not Take With Meloxicam 11)  Promethazine Hcl 25 Mg/ml Soln (Promethazine Hcl) .... Take 1 Pill Every 6 Hours As Needed For Nausea 12)  Epipen 0.3 Mg/0.71ml Devi (Epinephrine) .... If Having Allergic Reaction With Trouble Breathing, Usep En and Go Straight To Er  Allergies: 1)  ! Benadryl 2)  ! * Allegra D 3)  ! Codeine 4)  ! * Peanuts PMH-FH-SH reviewed for relevance  Review of Systems      See HPI  Physical Exam  General:  Well-developed,well-nourished,in no acute distress; alert,appropriate and cooperative throughout examination Lungs:  clear to auscultation bilaterally without wheezing, rales, or rhonchi.  Normal work of breathing  Heart:  Regular rate and rhythm without murmur, rub, or gallop.  Normal S1/S2  Psych:  Oriented X3 and memory intact for recent and remote.  continued flat affect with grin.   Impression & Recommendations:  Problem # 1:  DIABETES MELLITUS, TYPE II, CONTROLLED (ICD-250.00)  no changes.  reminded for eye exam and diabetes education.  Her updated medication list for this problem includes:    Lantus Solostar 100 Unit/ml Soln (Insulin glargine) ..... Inject 45 units subcutaneously q am. dispense one box; increase as directed by your doctor, qs    Aspirin Adult Low Strength 81 Mg Tbec (Aspirin) .Marland Kitchen... Take 1 tab by mouth daily    Accupril 10 Mg Tabs (Quinapril hcl) .Marland Kitchen... Take one tablet daily  Labs Reviewed: Creat: 0.86 (07/17/2009)   Microalbumin: 10 (07/17/2009) Reviewed HgBA1c results: 7.9 (11/20/2009)  7.6 (07/07/2009)  Orders: [facility]- Est  Level 4 ([address])  Problem # 2:  DROWSINESS (ICD-780.09)  ? depakote vs insomnia vs OSA.  patient wants  sleep study and would be willing to wear CPAP if needed.  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  Hx of angioedema?  She has an epipen avilable.  Orders: [facility]- Est  Level 4 ([address]) Allergy Referral  (Allergy)  Complete Medication List: 1)  Nasonex 50 Mcg/act Susp (Mometasone furoate) .... Two sprays per nostril daily 2)  B-4 Med Compression Hose Mens Misc (Elastic bandages & supports) .... Medium size with 30-41mm hg of support to wear every day.  knee highs please.  1 pair 3)  Lantus Solostar 100 Unit/ml Soln (Insulin glargine) .... Inject 45 units subcutaneously q am. dispense one box; increase as directed by your doctor, qs 4)  Aspirin Adult Low Strength 81 Mg Tbec (Aspirin) .... Take 1 tab by mouth daily 5)  Singulair 10 Mg Tabs (Montelukast sodium) .... One by mouth daily 6)  Accupril 10 Mg Tabs (Quinapril hcl) .... Take one tablet daily 7)  Lipitor 20 Mg Tabs (Atorvastatin calcium) .... Take one tablet daily 8)  Depakote 250 Mg Tbec (Divalproex sodium) .... Take one tablet twice a day for 3 days, then increased to 2 tablets twice a day 9)  Provera 10 Mg Tabs (Medroxyprogesterone acetate) .Marland Kitchen.. 1 by mouth once daily on days 14-19 each month to control heavy menses 10)  Ibuprofen 800 Mg Tabs (Ibuprofen) .... Take 1 pill every 8 hours as needed for pain - do not take with meloxicam 11)  Promethazine Hcl 25 Mg/ml Soln (Promethazine hcl) .... Take 1 pill every 6 hours as needed for nausea 12)  Epipen 0.3 Mg/0.72ml Devi (Epinephrine) .... If having allergic reaction with trouble breathing, usep en and go straight to er 13)  Hydrocodone-acetaminophen 5-500 Mg Tabs (Hydrocodone-acetaminophen) .... One q 6  hours as needed for severe pain  Other Orders: Hemoglobin-[facility] (56387)  Patient Instructions: 1)  make appointment for fasting bloodwork 2)  make appointment for diabetes eye exam 3)  call back the lady to set up your diabetes class 4)  I will set up sleep study for you 5)  I will set up appointment with allergist for you 6)  Keep taking depakote 7)  follow-up in 2 months   Orders Added: 1)  Hemoglobin-[facility] [85018] 2)  Split Night [Split Night] 3)  [facility]- Est  Level 4 [56433] 4)  Allergy Referral  [Allergy]    Laboratory Results   Blood Tests   Date/Time Received: December 11, 2009 3:27 PM  Date/Time Reported: December 11, 2009 3:38 PM  CBC   HGB:  13.7 g/dL   (Normal Range: 16.1-09.6 in Males, 12.0-15.0 in Females) Comments: capillary sample ...............test performed by......Marland KitchenBonnie A. [place], MLS (ASCP)cm      Appended Document: F/U VISIT/[facility]    Clinical Lists Changes  Problems: Removed problem of NAUSEA (ICD-787.02) Removed problem of DYSURIA (ICD-788.1) Removed problem of URINARY URGENCY (EAV-409.81) Removed problem of URINARY HESITANCY (ICD-788.64) Removed problem of CONTRACEPTIVE MANAGEMENT (ICD-V25.09) Removed problem of WELL WOMAN (ICD-V70.0) Removed problem of SCREENING FOR MALIGNANT NEOPLASM OF THE CERVIX (ICD-V76.2) Removed problem of DYSPNEA (ICD-786.05) Removed problem of IRREGULAR MENSTRUAL CYCLE (ICD-626.4) Removed problem of GYNECOLOGICAL EXAMINATION, ROUTINE (ICD-V72.31)

## 2010-02-08 NOTE — Assessment & Plan Note (Signed)
Summary: Behavioral Medicine follow-up   Primary Care Provider:  Delbert Harness MD   History of Present Illness: Margaret Larsen presented with her homework complete.  Her list of things that she is avoiding included: 1) Telling people how I feel 2) Going places 3) Associating with people 4) MOving away or out of town and facing the fact that my mom is not on earth but is still with me 5) Hurting people's feelings but not knowing that it's my feelings that always get hurt  Margaret Larsen reported that she has started back to work and is feeling tired and her feet hurt.  She comes home, showers and goes upstairs to her room.  Her family members come up there if they need something.    She used to walk in [address] with Toniann Fail and had lost some weight.    Allergies: 1)  ! Benadryl 2)  ! * Allegra D 3)  ! Codeine   Impression & Recommendations:  Problem # 1:  PTSD (ICD-309.81)  Hard to categorize whether symptoms fall under anxiety or depression.  Avoidance of life in general is a pretty big theme but upon further questioning, it doesn't sound like her family has much to offer at the end of the day that she is interested in.  They want to play games on the [place] that she is not interested in or watch television programs (Lord of the Rings) that do not appeal to her.  She says she is available to them in her room and will respond to any needs.  Still, it sounds like pretty basic involvement (I am there if they need me) rather than true engagement (I want to spend time with you).  Television / video games seem like a barrier to relating.  Asked her what she might be able to do differently.  She self-identified a goal.  See patient instructions.    Would like to use PHQ-9 next visit and also revisit the idea of a trauma narrative around the death of her mother.  Will need to look into this.  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  Orders: Therapy 40-50- min- [facility] ([address])  Complete Medication List: 1)  Nasonex 50 Mcg/act Susp (Mometasone furoate) .... Two sprays per nostril daily 2)  B-4 Med Compression Hose Mens Misc (Elastic bandages & supports) .... Medium size with 30-25mm hg of support to wear every day.  knee highs please.  1 pair 3)  Lantus Solostar 100 Unit/ml Soln (Insulin glargine) .... Inject 45 units subcutaneously q am. dispense one box; increase as directed by your doctor, qs 4)  Aspirin Adult Low Strength 81 Mg Tbec (Aspirin) .... Take 1 tab by mouth daily 5)  Singulair 10 Mg Tabs (Montelukast sodium) .... One by mouth daily 6)  Accupril 10 Mg Tabs (Quinapril hcl) .... Take one tablet daily 7)  Lipitor 20 Mg Tabs (Atorvastatin calcium) .... Take one tablet daily 8)  Depakote 250 Mg Tbec (Divalproex sodium) .... Take one tablet twice a day for 3 days, then increased to 2 tablets twice a day 9)  Provera 10 Mg Tabs (Medroxyprogesterone acetate) .Marland Kitchen.. 1 by mouth once daily on days 14-19 each month to control heavy menses 10)  Meloxicam 15 Mg Tabs (Meloxicam) .... Take 1/2 to 1 tablet twice a day for pain 11)  Tramadol Hcl 50 Mg Tabs (Tramadol hcl) .... Take 1-2 pills every 6 hours if needed for  pain 12)  Ibuprofen 800 Mg Tabs (Ibuprofen) .... Take 1 pill every 8 hours as needed for pain - do not take with meloxicam  Patient Instructions: 1)  Please schedule a follow-up for:  Thursday, Sept. 8th at 4:00. 2)  You decided to walk down to your daughter's house on Tuesday of next week.  You thought this was important because it will help you to lose weight AND it keeps you out of the bed.  Not to mention - it is time with kids away from the television. 3)  I will be anxious to hear how this goes.

## 2010-02-08 NOTE — Assessment & Plan Note (Signed)
Summary: uti?,df   Vitals Entered By: Renato Battles slade,cma CC: low back pain and smelly urine x 1 week. Is Patient Diabetic? Yes Pain Assessment Patient in pain? yes     Location: back Intensity: 8 Onset of pain  x 1 week.   Primary Care Provider:  Delbert Harness MD  CC:  low back pain and smelly urine x 1 week.Marland Kitchen  History of Present Illness: Urine smells, and has right flank pain that is worse when she lays down.  Denies fever, chills, nausea, dysuris.  Reports frequency and urge.  Her blood sugars have been under 100 today.  +PMH of obstructing renal calculus in 2008 requiring stenting of right ureter.  She has not had any problems since.  Habits & Providers  Alcohol-Tobacco-Diet     Tobacco Status: never  Current Medications (verified): 1)  Nasonex 50 Mcg/act Susp (Mometasone Furoate) .... Two Sprays Per Nostril Daily 2)  B-4 Med Compression Hose Mens   Misc (Elastic Bandages & Supports) .... Medium Size With 30-34mm Hg of Support To Wear Every Day.  Knee Highs Please.  1 Pair 3)  Lantus Solostar 100 Unit/ml Soln (Insulin Glargine) .... Inject 45 Units Subcutaneously Q Am. Dispense One Box; Increase As Directed By Your Doctor, Qs 4)  Aspirin Adult Low Strength 81 Mg Tbec (Aspirin) .... Take 1 Tab By Mouth Daily 5)  Singulair 10 Mg Tabs (Montelukast Sodium) .... One By Mouth Daily 6)  Accupril 10 Mg Tabs (Quinapril Hcl) .... Take One Tablet Daily 7)  Lipitor 20 Mg Tabs (Atorvastatin Calcium) .... Take One Tablet Daily 8)  Depakote 250 Mg Tbec (Divalproex Sodium) .... Take One Tablet Twice A Day For 3 Days, Then Increased To 2 Tablets Twice A Day 9)  Provera 10 Mg Tabs (Medroxyprogesterone Acetate) .Marland Kitchen.. 1 By Mouth Once Daily On Days 14-19 Each Month To Control Heavy Menses 10)  Ibuprofen 800 Mg Tabs (Ibuprofen) .... Take 1 Pill Every 8 Hours As Needed For Pain - Do Not Take With Meloxicam 11)  Promethazine Hcl 25 Mg/ml Soln (Promethazine Hcl) .... Take 1 Pill Every 6 Hours As Needed  For Nausea 12)  Epipen 0.3 Mg/0.7ml Devi (Epinephrine) .... If Having Allergic Reaction With Trouble Breathing, Usep En and Go Straight To Er 13)  Hydrocodone-Acetaminophen 5-500 Mg Tabs (Hydrocodone-Acetaminophen) .... One Q 6 Hours As Needed For Severe Pain  Allergies (verified): 1)  ! Benadryl 2)  ! * Allegra D 3)  ! Codeine 4)  ! * Peanuts  Review of Systems General:  Denies chills, fever, and loss of appetite. GU:  Complains of incontinence and urinary frequency; denies abnormal vaginal bleeding, discharge, dysuria, hematuria, and urinary hesitancy.  Physical Exam  General:  alert, in no acute distress, moved around easily on and off exam table Abdomen:  + right CVA tenderness Abdomen:  soft, non tender   Impression & Recommendations:  Problem # 1:  URINARY URGENCY (WJX-914.78)  Dip stick was + mod blood but on micro non sig, and no pus despipte a dirty sample.  Worry more about renal calculi as she had an obstructing stone in 2008 requiring stenting.  She was given instructions to drink a lot of flush kidneys, to strain urine, to use pain med when needed only, to go to ER for severe pain and follow up with primary MD as needed.  Orders: [facility]- Est Level  3 (29562)  Complete Medication List: 1)  Nasonex 50 Mcg/act Susp (Mometasone furoate) .... Two sprays per nostril daily 2)  B-4 Med Compression Hose Mens Misc (Elastic bandages & supports) .... Medium size with 30-53mm hg of support to wear every day.  knee highs please.  1 pair 3)  Lantus Solostar 100 Unit/ml Soln (Insulin glargine) .... Inject 45 units subcutaneously q am. dispense one box; increase as directed by your doctor, qs 4)  Aspirin Adult Low Strength 81 Mg Tbec (Aspirin) .... Take 1 tab by mouth daily 5)  Singulair 10 Mg Tabs (Montelukast sodium) .... One by mouth daily 6)  Accupril 10 Mg Tabs (Quinapril hcl) .... Take one tablet daily 7)  Lipitor 20 Mg Tabs (Atorvastatin calcium) .... Take one tablet daily 8)   Depakote 250 Mg Tbec (Divalproex sodium) .... Take one tablet twice a day for 3 days, then increased to 2 tablets twice a day 9)  Provera 10 Mg Tabs (Medroxyprogesterone acetate) .Marland Kitchen.. 1 by mouth once daily on days 14-19 each month to control heavy menses 10)  Ibuprofen 800 Mg Tabs (Ibuprofen) .... Take 1 pill every 8 hours as needed for pain - do not take with meloxicam 11)  Promethazine Hcl 25 Mg/ml Soln (Promethazine hcl) .... Take 1 pill every 6 hours as needed for nausea 12)  Epipen 0.3 Mg/0.53ml Devi (Epinephrine) .... If having allergic reaction with trouble breathing, usep en and go straight to er 13)  Hydrocodone-acetaminophen 5-500 Mg Tabs (Hydrocodone-acetaminophen) .... One q 6 hours as needed for severe pain  Other Orders: Urinalysis-[facility] (00000)  Patient Instructions: 1)  Drink a gallon of water daily for at least 3 days 2)  strain your urine 3)  take the pain medicine to control the discomfort 4)  If pain gets severe go to the ER 5)  otherwise return to see your doctor in 2-3 weeks if needed Prescriptions: HYDROCODONE-ACETAMINOPHEN 5-500 MG TABS (HYDROCODONE-ACETAMINOPHEN) one q 6 hours as needed for severe pain Brand medically necessary #25 x 0   Entered and Authorized by:   Luretha Murphy NP   Signed by:   Luretha Murphy NP on 12/15/2009   Method used:   Print then Give to Patient   RxID:   [id number]    Orders Added: 1)  Urinalysis-[facility] [00000] 2)  [facility]- Est Level  3 [86578]    Laboratory Results   Urine Tests  Date/Time Received: December 15, 2009 3:35 PM  Date/Time Reported: December 15, 2009 4:19 PM   Routine Urinalysis   Color: yellow Appearance: Clear Glucose: negative   (Normal Range: Negative) Bilirubin: negative   (Normal Range: Negative) Ketone: trace (5)   (Normal Range: Negative) Spec. Gravity: 1.020   (Normal Range: 1.003-1.035) Blood: moderate   (Normal Range: Negative) pH: 7.5   (Normal Range: 5.0-8.0) Protein: negative   (Normal Range:  Negative) Urobilinogen: 0.2   (Normal Range: 0-1) Nitrite: negative   (Normal Range: Negative) Leukocyte Esterace: negative   (Normal Range: Negative)  Urine Microscopic WBC/HPF: 1-5 RBC/HPF: 0-3 Bacteria/HPF: 2+ cocci Epithelial/HPF: >20  with clue cells also present    Comments: ...............test performed by......Marland KitchenBonnie A. [place], MLS (ASCP)cm

## 2010-02-08 NOTE — Miscellaneous (Signed)
Summary: [facility] Progam Medication Substitution  Clinical Lists Changes  Medications: Changed medication from LISINOPRIL 10 MG TABS (LISINOPRIL) 1 tab by mouth daily for blood pressure to ACCUPRIL 10 MG TABS (QUINAPRIL HCL) take one tablet daily - Signed Changed medication from ZOCOR 40 MG TABS (SIMVASTATIN) 1 tab by mouth daily for cholesterol to LIPITOR 20 MG TABS (ATORVASTATIN CALCIUM) take one tablet daily - Signed Changed medication from FLUTICASONE PROPIONATE 50 MCG/ACT SUSP (FLUTICASONE PROPIONATE) 2 sprays in each nostril daily to NASONEX 50 MCG/ACT SUSP (MOMETASONE FUROATE) two sprays per nostril daily - Signed Rx of ACCUPRIL 10 MG TABS (QUINAPRIL HCL) take one tablet daily;  #30 x 2;  Signed;  Entered by: Delbert Harness MD;  Authorized by: Delbert Harness MD;  Method used: Print then Give to Patient Rx of LIPITOR 20 MG TABS (ATORVASTATIN CALCIUM) take one tablet daily;  #30 x 2;  Signed;  Entered by: Delbert Harness MD;  Authorized by: Delbert Harness MD;  Method used: Print then Give to Patient Rx of NASONEX 50 MCG/ACT SUSP (MOMETASONE FUROATE) two sprays per nostril daily;  #1 x 2;  Signed;  Entered by: Delbert Harness MD;  Authorized by: Delbert Harness MD;  Method used: Print then Give to Patient    Prescriptions: NASONEX 50 MCG/ACT SUSP (MOMETASONE FUROATE) two sprays per nostril daily  #1 x 2   Entered and Authorized by:   Delbert Harness MD   Signed by:   Delbert Harness MD on 08/18/2009   Method used:   Print then Give to Patient   RxID:   [id number] LIPITOR 20 MG TABS (ATORVASTATIN CALCIUM) take one tablet daily  #30 x 2   Entered and Authorized by:   Delbert Harness MD   Signed by:   Delbert Harness MD on 08/18/2009   Method used:   Print then Give to Patient   RxID:   [id number] ACCUPRIL 10 MG TABS (QUINAPRIL HCL) take one tablet daily  #30 x 2   Entered and Authorized by:   Delbert Harness MD   Signed by:   Delbert Harness MD on 08/18/2009   Method used:   Print then Give to Patient   RxID:    [id number]
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
## 2010-02-08 NOTE — Progress Notes (Signed)
Summary: triage  Phone Note Call from Patient Call back at Home Phone [phone]   Caller: Patient Summary of Call: pills are making her sick/feel bad - needs to talk to nurse Initial call taken by: De Nurse,  July 21, 2009 1:50 PM  Follow-up for Phone Call        states the provera  & carbamazepine makes her feel "horrible" dizzy, drowsy. 187 cbg this am which she says is very high for her. wants to not take either. explained not taking the provera will mean a slower end to her vaginal bleeding. she wants to know what else she can take. pcp & md who gave her the provera not here. sent to preceptor as pt wants an answer now Follow-up by: Golden Circle RN,  July 21, 2009 2:12 PM  Additional Follow-up for Phone Call Additional follow up Details #1::        told her Dr. Deirdre Priest advised continuing the meds & see her md. states she does not want to take the meds. told her that was her decision. appt made with pcp next monday Additional Follow-up by: Golden Circle RN,  July 21, 2009 2:24 PM

## 2010-02-08 NOTE — Progress Notes (Signed)
  Phone Note Call from Patient   Summary of Call: took haldol last night and one this morning.  Feels sleepy but hallcuinations and agitation improved.  Advised to decrease to once at bedtime and to call back to checkin if she has further problems  Initial call taken by: Delbert Harness MD,  December 27, 2009 9:02 PM

## 2010-02-08 NOTE — Assessment & Plan Note (Signed)
Summary: NUMBNESS TO LOWER LEGS AND FEET/[facility]   Vital Signs:  Patient profile:   40 year old female Height:      60.25 inches Weight:      231.38 pounds BMI:     44.98 BSA:     1.99 Temp:     98.0 degrees F Pulse rate:   80 / minute BP sitting:   110 / 72  Vitals Entered By: Jone Baseman CMA (January 23, 2010 10:57 AM) CC: numbness to lower legs and feet Is Patient Diabetic? Yes Pain Assessment Patient in pain? yes     Location: legs and feet Intensity: 9   Primary Care Provider:  Delbert Harness MD  CC:  numbness to lower legs and feet.  History of Present Illness: 40 yo female here with bilateral foot buring.  Pt states that she has had this feeling on and off for the last 3-4 months but now for the last week seemed to be much worse and is even affecting her on standing at her job.  Pt has not changed medication because not taking haldol due to making her feel bad.  Pt states that it feels warm like her feet are on fire and asleep mostly on the bottom of her feet.  Pt has tried switching shoes and socks with no improvement. Denies any swelling redness or discoloration.   Pt does have hx of dm.  no improvement in sugars and are actually worse than usual.  Pt states they have been elevated and near 300 recently.  Pt states she is not been watching her diet.     Habits & Providers  Alcohol-Tobacco-Diet     Tobacco Status: never  Current Medications (verified): 1)  Nasonex 50 Mcg/act Susp (Mometasone Furoate) .... Two Sprays Per Nostril Daily 2)  B-4 Med Compression Hose Mens   Misc (Elastic Bandages & Supports) .... Medium Size With 30-29mm Hg of Support To Wear Every Day.  Knee Highs Please.  1 Pair 3)  Lantus Solostar 100 Unit/ml Soln (Insulin Glargine) .... Inject 50 Units Subcutaneously Q Am.  Increase As Directed By Your Doctor.  Dispense Qs 1 Month 4)  Aspirin Adult Low Strength 81 Mg Tbec (Aspirin) .... Take 1 Tab By Mouth Daily 5)  Singulair 10 Mg Tabs  (Montelukast Sodium) .... One By Mouth Daily 6)  Accupril 10 Mg Tabs (Quinapril Hcl) .... Take One Tablet Daily 7)  Lipitor 20 Mg Tabs (Atorvastatin Calcium) .... Take One Tablet Daily 8)  Depakote 250 Mg Tbec (Divalproex Sodium) .... Take One Tablet Twice A Day For 3 Days, Then Increased To 2 Tablets Twice A Day 9)  Provera 10 Mg Tabs (Medroxyprogesterone Acetate) .Marland Kitchen.. 1 By Mouth Once Daily On Days 14-19 Each Month To Control Heavy Menses 10)  Ibuprofen 800 Mg Tabs (Ibuprofen) .... Take 1 Pill Every 8 Hours As Needed For Pain - Do Not Take With Meloxicam 11)  Promethazine Hcl 25 Mg/ml Soln (Promethazine Hcl) .... Take 1 Pill Every 6 Hours As Needed For Nausea 12)  Epipen 0.3 Mg/0.73ml Devi (Epinephrine) .... If Having Allergic Reaction With Trouble Breathing, Usep En and Go Straight To Er 13)  Hydrocodone-Acetaminophen 5-500 Mg Tabs (Hydrocodone-Acetaminophen) .... One Q 6 Hours As Needed For Severe Pain 14)  Haloperidol 2 Mg Tabs (Haloperidol) .... Take One Tablet Twice A Day 15)  Depakote Er 500 Mg Xr24h-Tab (Divalproex Sodium) .... Take Two Tabs Daily (In Place of 250mg  Two Tabs Twice A Day) 16)  Gabapentin 100 Mg Caps (  Gabapentin) .Marland Kitchen.. 1 Tab By Mouth At Bedtime For The Next Week Then 2 Tabs At Bedtime  Allergies (verified): 1)  ! Benadryl 2)  ! * Allegra D 3)  ! Codeine 4)  ! * Peanuts  Past History:  Past medical, surgical, family and social histories (including risk factors) reviewed, and no changes noted (except as noted below).  Past Medical History: Reviewed history from 07/13/2009 and no changes required. Pre-eclampsia in 1996 pregnancy GERD Allergic rhinitis Bipolar DM htn hld       Past Surgical History: Reviewed history from 04/29/2007 and no changes required. Cholecystectomy -, LTCS X4 -BTL      Family History: Reviewed history from 04/29/2007 and no changes required. B:  Asthma, Children: oldest two kids  obese, sickle trait from dad?, F:  Healthy, M:  Hodgkins,  DM, HTN, obesity,  No breast CA or MI <age 88, PGF:  Colon CA dx`d in 80`s, MI in 80`s No history of mental illness in family.      Social History: Reviewed history from 08/31/2007 and no changes required. Not married, has 4 children, 18, 17, 11,4.  Works at [employer] as a Conservation officer, nature.  Lost mother in 9/08.   Never Smoked currently separated from significant other Alcohol use-no Drug use-no    Physical Exam  General:  alert, in no acute distress.  Poor eye contact. Eyes:  no conjunctival pallor.   Mouth:  oral mucosa moist and pink  Lungs:  clear to auscultation bilaterally without wheezing, rales, or rhonchi.  Normal work of breathing  Heart:  Regular rate and rhythm without murmur, rub, or gallop.  Normal S1/S2  Msk:  see diabetic foot exam, decrease sensation on plantar surface of foot.  Extremities:  no swelling   Diabetes Management Exam:    Foot Exam (with socks and/or shoes not present):       Sensory-Pinprick/Light touch:          Left medial foot (L-4): normal          Left dorsal foot (L-5): normal          Left lateral foot (S-1): diminished          Right medial foot (L-4): normal          Right dorsal foot (L-5): normal          Right lateral foot (S-1): normal       Sensory-Monofilament:          Left foot: diminished          Right foot: diminished       Inspection:          Left foot: normal          Right foot: normal       Nails:          Left foot: normal          Right foot: normal    Foot Exam by Podiatrist:       Results: mild diabetic findings   Impression & Recommendations:  Problem # 1:  DIABETIC PERIPHERAL NEUROPATHY (ICD-250.60) Assessment New appears to have set in.  Told pt the improtance of keeping sugars undercontrol.  Will start neurontin warned pt of side effect panel, will titrate up slowly.  Will have pt follow up in 2-3 weeks with PCP to see how she is doing.  Her updated medication list for this problem includes:    Lantus Solostar 100  Unit/ml Soln (Insulin glargine) .Marland KitchenMarland KitchenMarland KitchenMarland Kitchen  Inject 50 units subcutaneously q am.  increase as directed by your doctor.  dispense qs 1 month    Aspirin Adult Low Strength 81 Mg Tbec (Aspirin) .Marland Kitchen... Take 1 tab by mouth daily    Accupril 10 Mg Tabs (Quinapril hcl) .Marland Kitchen... Take one tablet daily  Orders: [facility]- Est Level  3 (04540)  Problem # 2:  DIABETES MELLITUS, TYPE II, CONTROLLED (ICD-250.00) seems uncontrolled, encouraged the improtance of keeping sugars under control and how this will help keep from the neuoropathy from spreading. Will have PCP reintroduce concept again.  Her updated medication list for this problem includes:    Lantus Solostar 100 Unit/ml Soln (Insulin glargine) ..... Inject 50 units subcutaneously q am.  increase as directed by your doctor.  dispense qs 1 month    Aspirin Adult Low Strength 81 Mg Tbec (Aspirin) .Marland Kitchen... Take 1 tab by mouth daily    Accupril 10 Mg Tabs (Quinapril hcl) .Marland Kitchen... Take one tablet daily  Orders: [facility]- Est Level  3 (98119)  Complete Medication List: 1)  Nasonex 50 Mcg/act Susp (Mometasone furoate) .... Two sprays per nostril daily 2)  B-4 Med Compression Hose Mens Misc (Elastic bandages & supports) .... Medium size with 30-71mm hg of support to wear every day.  knee highs please.  1 pair 3)  Lantus Solostar 100 Unit/ml Soln (Insulin glargine) .... Inject 50 units subcutaneously q am.  increase as directed by your doctor.  dispense qs 1 month 4)  Aspirin Adult Low Strength 81 Mg Tbec (Aspirin) .... Take 1 tab by mouth daily 5)  Singulair 10 Mg Tabs (Montelukast sodium) .... One by mouth daily 6)  Accupril 10 Mg Tabs (Quinapril hcl) .... Take one tablet daily 7)  Lipitor 20 Mg Tabs (Atorvastatin calcium) .... Take one tablet daily 8)  Depakote 250 Mg Tbec (Divalproex sodium) .... Take one tablet twice a day for 3 days, then increased to 2 tablets twice a day 9)  Provera 10 Mg Tabs (Medroxyprogesterone acetate) .Marland Kitchen.. 1 by mouth once daily on days 14-19 each month  to control heavy menses 10)  Ibuprofen 800 Mg Tabs (Ibuprofen) .... Take 1 pill every 8 hours as needed for pain - do not take with meloxicam 11)  Promethazine Hcl 25 Mg/ml Soln (Promethazine hcl) .... Take 1 pill every 6 hours as needed for nausea 12)  Epipen 0.3 Mg/0.62ml Devi (Epinephrine) .... If having allergic reaction with trouble breathing, usep en and go straight to er 13)  Hydrocodone-acetaminophen 5-500 Mg Tabs (Hydrocodone-acetaminophen) .... One q 6 hours as needed for severe pain 14)  Haloperidol 2 Mg Tabs (Haloperidol) .... Take one tablet twice a day 15)  Depakote Er 500 Mg Xr24h-tab (Divalproex sodium) .... Take two tabs daily (in place of 250mg  two tabs twice a day) 16)  Gabapentin 100 Mg Caps (Gabapentin) .Marland Kitchen.. 1 tab by mouth at bedtime for the next week then 2 tabs at bedtime  Patient Instructions: 1)  Nice to meet you 2)  I think you have early neuropathy in your feet from your diabetes 3)  To make sure this doesn't get worse you need to keep really good control of your diabetes 4)  We will start a new medication called gabapentin to try for the pain.   Take 1 tab by mouth at bedtime for the first week then 2 tabs at bedtime  5)  Come see Dr. Earnest Bailey again in 2-3 weeks.  Prescriptions: GABAPENTIN 100 MG CAPS (GABAPENTIN) 1 tab by mouth at bedtime for  the next week then 2 tabs at bedtime  #60 x 1   Entered and Authorized by:   Antoine Primas DO   Signed by:   Antoine Primas DO on 01/23/2010   Method used:   Electronically to        [facility] (retail)       [address]       [place], [place]  [address]       Ph: [phone]       Fax: [phone]   RxID:   [id number]    Orders Added: 1)  [facility]- Est Level  3 [[address]]

## 2010-02-08 NOTE — Progress Notes (Signed)
Summary: Rx  Phone Note Refill Request Call back at Home Phone [phone]   Refills Requested: Medication #1:  DEPAKOTE 250 MG TBEC take one tablet twice a day for 3 days pt would like this sent to [facility], they need refills for 1 year  Initial call taken by: Knox Royalty,  January 09, 2010 10:47 AM    New/Updated Medications: DEPAKOTE ER 500 MG XR24H-TAB (DIVALPROEX SODIUM) take two tabs daily (in place of 250mg  two tabs twice a day) Prescriptions: DEPAKOTE ER 500 MG XR24H-TAB (DIVALPROEX SODIUM) take two tabs daily (in place of 250mg  two tabs twice a day)  #60 x 11   Entered and Authorized by:   Delbert Harness MD   Signed by:   Delbert Harness MD on 01/09/2010   Method used:   Print then Give to Patient   RxID:   [id number]

## 2010-02-08 NOTE — Assessment & Plan Note (Signed)
Summary: Behavioral Medicine Follow-up   Primary Care Provider:  Delbert Harness MD   History of Present Illness: Margaret Larsen presents without a significant agenda.  She does report being "in the bed" the last 3-4 days.  She states she did not get out of bed except for church yesterday - although she was looking forward to that.  She ate about one meal a day and got up to use the bathroom.  The television was on but mostly she slept.  She denied a previous experience but later reports "taking to the bed" after her mother died.  Friends and family have asked her what is wrong and she is unable to explain it.  She feels mildly sad, reports anhedonia, decreased motivation and energy, increased fatigue.  Denies tearfulness, suicidal ideation.    She reports a period of energy earlier this month.  No decreased need for sleep but increased energy and motivation as well as goal directed activity.  It was around moving to a different home.  She packed boxes and was feeling "good."  That motivation is gone and the plans to move are on hold.  In terms of age of onset of mood disorders, Margaret Larsen reports being like this all of her life but that her mood issues started after her mother's death.  Could not get a firmer handle on mood issues during childhood and adolescence.    Allergies: 1)  ! Benadryl 2)  ! * Allegra D 3)  ! 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  Affect is mildly depressed appearing.  She is hard to draw out.  I asked about the difficulty I was having, suggesting perhaps she wasn't feeling connected to me and she reported, "everyone says that about me" (that talking to her is like pulling teeth or that she offers up confusing answers to questions).  This may relate to her statement that "no one understands [her]."  Eliciting history is difficult.  No real clear pattern of shift in energy, mood and motivation  however this recent downward shifts appears to be on the heels of increased energy.  That coupled with her experience on the higher dose of Wellbutrin puts her in the "other" category although I am reluctant to label it a Bipolar Disorder presently.  Additionally, the PTSD symtpoms seem to be taking up a lot of space in terms of her function.  Orders: Therapy 40-50- min- [facility] ([address])  Problem # 2:  PTSD (ICD-309.81) [name] big concern seems to be around not being able to trust or open up to others.  She reports not understanding this but given her background, it makes good sense to me.  We discussed the tendency to protect one's self after trauma as she has experienced.  Did some brief psychoeducation on PTSD.  She says she has never heard of it before and knew nothing about it.  She reported that my description fit the way she viewed the world and moved in it.  Provided a chapter out of a PTSD book that encourages writing.  Described therapy as mostly work outside of the time that we meet and that this homework would be a good test as to how ready she was to do this work.  She says that for a while she used to journal daily.  She was planning on doing her homework this afternoon.  Will follow on: August 9th at 2:00.  She  starts back at [facility] later that week I think. Orders: Therapy 40-50- min- [facility] ([address])  Complete Medication List: 1)  Fluticasone Propionate 50 Mcg/act Susp (Fluticasone propionate) .... 2 sprays in each nostril daily 2)  B-4 Med Compression Hose Mens Misc (Elastic bandages & supports) .... Medium size with 30-14mm hg of support to wear every day.  knee highs please.  1 pair 3)  Lantus Solostar 100 Unit/ml Soln (Insulin glargine) .... Inject 45 units subcutaneously q am. dispense one box; increase as directed by your doctor, qs 4)  Aspirin Adult Low Strength 81 Mg Tbec (Aspirin) .... Take 1 tab by mouth daily 5)  Singulair 10 Mg Tabs (Montelukast sodium) .... One by mouth  daily 6)  Lisinopril 10 Mg Tabs (Lisinopril) .Marland Kitchen.. 1 tab by mouth daily for blood pressure 7)  Zocor 40 Mg Tabs (Simvastatin) .Marland Kitchen.. 1 tab by mouth daily for cholesterol 8)  Depakote 250 Mg Tbec (Divalproex sodium) .... Take one tablet twice a day for 3 days, then increased to 2 tablets twice a day 9)  Provera 10 Mg Tabs (Medroxyprogesterone acetate) .Marland Kitchen.. 1 by mouth once daily on days 14-19 each month to control heavy menses 10)  Meloxicam 15 Mg Tabs (Meloxicam) .... Take 1/2 to 1 tablet twice a day for pain

## 2010-02-11 ENCOUNTER — Telehealth: Payer: Self-pay | Admitting: Family Medicine

## 2010-02-13 ENCOUNTER — Ambulatory Visit (HOSPITAL_COMMUNITY)
Admission: RE | Admit: 2010-02-13 | Discharge: 2010-02-13 | Disposition: A | Discharge status: Home / Self Care | Payer: Self-pay | Source: Ambulatory Visit | Attending: Family Medicine | Admitting: Family Medicine

## 2010-02-13 ENCOUNTER — Other Ambulatory Visit: Payer: Self-pay | Admitting: Family Medicine

## 2010-02-13 ENCOUNTER — Encounter (INDEPENDENT_AMBULATORY_CARE_PROVIDER_SITE_OTHER): Payer: Self-pay | Admitting: Family Medicine

## 2010-02-13 ENCOUNTER — Encounter: Payer: Self-pay | Admitting: Family Medicine

## 2010-02-13 DIAGNOSIS — E119 Type 2 diabetes mellitus without complications: Secondary | ICD-10-CM

## 2010-02-13 DIAGNOSIS — F319 Bipolar disorder, unspecified: Secondary | ICD-10-CM

## 2010-02-13 DIAGNOSIS — M545 Low back pain, unspecified: Secondary | ICD-10-CM | POA: Insufficient documentation

## 2010-02-13 DIAGNOSIS — IMO0001 Reserved for inherently not codable concepts without codable children: Secondary | ICD-10-CM

## 2010-02-13 DIAGNOSIS — R209 Unspecified disturbances of skin sensation: Secondary | ICD-10-CM | POA: Insufficient documentation

## 2010-02-13 DIAGNOSIS — R200 Anesthesia of skin: Secondary | ICD-10-CM

## 2010-02-13 DIAGNOSIS — R197 Diarrhea, unspecified: Secondary | ICD-10-CM | POA: Insufficient documentation

## 2010-02-13 LAB — CK: Total CK: 74 U/L (ref 7–177)

## 2010-02-13 LAB — CONVERTED CEMR LAB: Hgb A1c MFr Bld: 8 %

## 2010-02-14 ENCOUNTER — Telehealth: Payer: Self-pay | Admitting: Family Medicine

## 2010-02-14 NOTE — Progress Notes (Signed)
  Phone Note Other Incoming   Summary of Call: BG was elevated at 254, now at time of call 214.  Usually her bg runs in 140's. Pt also states that she has swelling in hands and lower legs.  + H/A.  + loose stools x 2 days.  +nausea and decreased appetite x 1 week. + SOB x 1 week off and on.  no chest pain.  no fever. no vomiting.   No PND.  sleeps on 2 pillows.  Pt not SOB at time of call.   Pt instructed to call front office in AM for work-in appt.  If she feels she needs to be evaluated sooner she is to come to the ER.    Ellin Mayhew MD  February 06, 2010 9:55 PM

## 2010-02-22 NOTE — Progress Notes (Signed)
Summary: roi  Phone Note Outgoing Call   Call placed by: Loralee Pacas CMA,  January 29, 2010 9:21 AM Summary of Call: lvm for pt to come in to sign a ROI  Follow-up for Phone Call        pt did not see any reason to sign release due to not having ins.  however she would like to discuss medications b/c they are not working for her pain and some are causing her blood sugars to become elevated Follow-up by: Loralee Pacas CMA,  February 06, 2010 11:27 AM  Additional Follow-up for Phone Call Additional follow up Details #1::        The ROI was for psychiatry whom he said she had an appointment with.  I can discuss all of this when I see patient in office.  Since I do not hav records, i do not know what has been changed.  Please urge her to continue folllow-up with [facility], and she may always call there for emergency even while uninsured. Additional Follow-up by: Delbert Harness MD,  February 06, 2010 3:26 PM

## 2010-02-22 NOTE — Progress Notes (Signed)
  Phone Note Call from Patient   Caller: Patient Summary of Call: Patient called, states her blood sugars have been running 250s today for her last new measurements.  Complaining of loose stools for past several days.  Numbness on bottom of feet for past month for which she's been evaluated in clinic.  Called ER line 1/31 with almost exact complaints.  Told to call and make an appt the following day, which she did not.  When I asked about this, she dodged the question and continued to complain of her numbness.  I recommended calling and being evaluated in AM.  Patient states she thinks she needs to be evaluated by ED.   Discussed she likely would not need to be seen in ED as no red flags, but she still states she would like to go.  No chest pain, shortness of breath, abd pain, palpitations.  Will see her if they call us.   Initial call taken by: Renold Don MD,  February 11, 2010 8:28 PM

## 2010-02-22 NOTE — Assessment & Plan Note (Signed)
Summary: Muscle/back pain with leg numbness, Diarrhea, DM2, Bipolar   Vital Signs:  Patient profile:   40 year old female Height:      60.25 inches Weight:      231 pounds BMI:     44.90 Temp:     98.6 degrees F oral Pulse rate:   80 / minute BP sitting:   98 / 65  (left arm) Cuff size:   large  Vitals Entered By: Garen Grams LPN (February 13, 2010 8:59 AM) CC: Back pain with leg numbness,Diarrhea, DM2, Bipolar Is Patient Diabetic? No Pain Assessment Patient in pain? no        Primary Care Provider:  Delbert Harness MD  CC:  Back pain with leg numbness, Diarrhea, DM2, and Bipolar.  History of Present Illness: Muscle/Back pain with leg numbness: Pt thinks that she developed Rt sided leg pain and numbness about 2 months after she started the depakote. She says that when she works all day she has pain and numbnes in her Rt leg and numbness in her feet. No injury, no pain or numbness on the left side. 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   Diarrhea: Pt says that she is having diarrhea after every time she takes her Depakote. She says the diarrhea continues throughout the day. Pt has not lost any weight, infact she has gained weight since December.   DM2: PT says that her blood sugars have been out of control since she started on the Depakote in Sept. She says that her blood sugars are running in the 200's. This morning it was 156. She is taking Lantus 50 units every morning. She has diabetic neuropathy in both her feet. She is taking a small dose of Gabapentin and says it wasn't working so she recently quit taking it.    Bipolar: Pt is quite suspicious that her Depakote is causing a lot of her symptoms including high blood glucose levels, leg numbness and diarrhea. She says she has read the SE profile and it has her symptoms in there. She has an appointment to see a mental health provider on Aug 21st.    Habits & Providers  Alcohol-Tobacco-Diet     Tobacco Status: never  Current Medications (verified): 1)  Nasonex 50 Mcg/act Susp (Mometasone Furoate) .... Two Sprays Per Nostril Daily 2)  B-4 Med Compression Hose Mens   Misc (Elastic Bandages & Supports) .... Medium Size With 30-46mm Hg of Support To Wear Every Day.  Knee Highs Please.  1 Pair 3)  Lantus Solostar 100 Unit/ml Soln (Insulin Glargine) .... Inject 55 Units Subcutaneously Q Am.  Increase As Directed By Your Doctor.  Dispense Qs 1 Month 4)  Aspirin Adult Low Strength 81 Mg Tbec (Aspirin) .... Take 1 Tab By Mouth Daily 5)  Singulair 10 Mg Tabs (Montelukast Sodium) .... One By Mouth Daily 6)  Accupril 10 Mg Tabs (Quinapril Hcl) .... Take One Tablet Daily 7)  Lipitor 20 Mg Tabs (Atorvastatin Calcium) .... Take One Tablet Daily 8)  Provera 10 Mg Tabs (Medroxyprogesterone Acetate) .Marland Kitchen.. 1 By Mouth Once Daily On Days 14-19 Each Month To Control Heavy Menses 9)  Ibuprofen 800 Mg Tabs (Ibuprofen) .... Take 1 Pill Every 8 Hours As Needed For Pain - Do Not Take With Meloxicam 10)  Promethazine Hcl 25 Mg/ml Soln (Promethazine Hcl) .... Take 1 Pill Every 6 Hours As Needed For Nausea  11)  Epipen 0.3 Mg/0.64ml Devi (Epinephrine) .... If Having Allergic Reaction With Trouble Breathing, Usep En and Go Straight To Er 12)  Hydrocodone-Acetaminophen 5-500 Mg Tabs (Hydrocodone-Acetaminophen) .... One Q 6 Hours As Needed For Severe Pain 13)  Haloperidol 2 Mg Tabs (Haloperidol) .... Take One Tablet Twice A Day 14)  Depakote Er 500 Mg Xr24h-Tab (Divalproex Sodium) .... Take Two Tabs Daily (In Place of 250mg  Two Tabs Twice A Day) 15)  Gabapentin 100 Mg Caps (Gabapentin) .... Take 2 Pills in The Morning, 2 Pills At Night For 1 Month, (400mg  A Day) Then Call Your Md For Further Instructions  Allergies (verified): 1)  ! Benadryl 2)  ! * Allegra D 3)  ! Codeine 4)  ! * Peanuts  Review of Systems        vitals reviewed and pertinent negatives and  positives seen in HPI   Physical Exam  General:  Well-developed,well-nourished,in no acute distress; alert,appropriate and cooperative throughout examination Msk:  no back tenderness to palpation over the spinal processes, no tenderness with straight leg raise. Numbness on lateral side of upper and lower right leg. Decreased sensation on bilateral feet.   Diabetes Management Exam:    Foot Exam (with socks and/or shoes not present):       Sensory-Pinprick/Light touch:          Left medial foot (L-4): absent          Left dorsal foot (L-5): absent          Left lateral foot (S-1): absent          Right medial foot (L-4): absent          Right dorsal foot (L-5): absent          Right lateral foot (S-1): absent       Sensory-Monofilament:          Left foot: absent          Right foot: absent       Sensory-other: no sensory until just above the ankle       Inspection:          Left foot: normal          Right foot: normal       Nails:          Left foot: normal          Right foot: normal   Impression & Recommendations:  Problem # 1:  MUSCLE PAIN (ICD-729.1) Assessment New pt has pain in her low back and Rt leg with numbnes in theRt leg. I could not illicit these findings on exam although she did say she had decreased sensation in her Rt leg. ? neuropathy from disc compresion. Pt has never had a back x-ray to evaluate her numbnes or pain. Will get x-ray and check CK since she complains of muscle pain.   Her updated medication list for this problem includes:    Aspirin Adult Low Strength 81 Mg Tbec (Aspirin) .Marland Kitchen... Take 1 tab by mouth daily    Ibuprofen 800 Mg Tabs (Ibuprofen) .Marland Kitchen... Take 1 pill every 8 hours as needed for pain - do not take with meloxicam    Hydrocodone-acetaminophen 5-500 Mg Tabs (Hydrocodone-acetaminophen) ..... One q 6 hours as needed for severe pain  Orders: CK (Creatine Kinase)-[facility] [phone]) [facility]- Est  Level 4 (09811)  Problem # 2:  DIARRHEA  (ICD-787.91) Assessment: New  Pt contributes her diarrhea to the depakote with is possible however I  don't plan to change her psych med since she is getting ready to see a new mental health provider in just a week or two. Pt has not lost weight, no signs of dehydration. No change at this time.   Orders: [facility]- Est  Level 4 (03500)  Problem # 3:  DIABETES MELLITUS, TYPE II, CONTROLLED (ICD-250.00) Assessment: Deteriorated Pt's A1c is now 8.0 (up from 7.9) and she reports her CBG's being elevated. Plan to increase her Lantus to 55 units daily. She will follow up with PCP abouther DM. Will increase herGabapentin for the diabetic neuropathy  Her updated medication list for this problem includes:    Lantus Solostar 100 Unit/ml Soln (Insulin glargine) ..... Inject 55 units subcutaneously q am.  increase as directed by your doctor.  dispense qs 1 month    Aspirin Adult Low Strength 81 Mg Tbec (Aspirin) .Marland Kitchen... Take 1 tab by mouth daily    Accupril 10 Mg Tabs (Quinapril hcl) .Marland Kitchen... Take one tablet daily  Orders: A1C-[facility] (93818) [facility]- Est  Level 4 (29937)  Problem # 4:  BIPOLAR DISORDER UNSPECIFIED (ICD-296.80) Assessment: Deteriorated Pt says she is still seeing stuff that isn't there, but continues to take her medicine. She is paranoid that the medicine is causing all kinds of side effects. I am thrilled that she is going to be see a Mental Health provider on Feb 21st. I plan to leave her meds unchanged until she has a chance to go over these meds with the new provider. If there is a recommendation we can switch or if there is not, maybe lamictal would be a better choice in teh future. Will defer to PCP.    Orders: [facility]- Est  Level 4 (99214)  Complete Medication List: 1)  Nasonex 50 Mcg/act Susp (Mometasone furoate) .... Two sprays per nostril daily 2)  B-4 Med Compression Hose Mens Misc (Elastic bandages & supports) .... Medium size with 30-9mm hg of support to wear every day.  knee highs please.   1 pair 3)  Lantus Solostar 100 Unit/ml Soln (Insulin glargine) .... Inject 55 units subcutaneously q am.  increase as directed by your doctor.  dispense qs 1 month 4)  Aspirin Adult Low Strength 81 Mg Tbec (Aspirin) .... Take 1 tab by mouth daily 5)  Singulair 10 Mg Tabs (Montelukast sodium) .... One by mouth daily 6)  Accupril 10 Mg Tabs (Quinapril hcl) .... Take one tablet daily 7)  Lipitor 20 Mg Tabs (Atorvastatin calcium) .... Take one tablet daily 8)  Provera 10 Mg Tabs (Medroxyprogesterone acetate) .Marland Kitchen.. 1 by mouth once daily on days 14-19 each month to control heavy menses 9)  Ibuprofen 800 Mg Tabs (Ibuprofen) .... Take 1 pill every 8 hours as needed for pain - do not take with meloxicam 10)  Promethazine Hcl 25 Mg/ml Soln (Promethazine hcl) .... Take 1 pill every 6 hours as needed for nausea 11)  Epipen 0.3 Mg/0.62ml Devi (Epinephrine) .... If having allergic reaction with trouble breathing, usep en and go straight to er 12)  Hydrocodone-acetaminophen 5-500 Mg Tabs (Hydrocodone-acetaminophen) .... One q 6 hours as needed for severe pain 13)  Haloperidol 2 Mg Tabs (Haloperidol) .... Take one tablet twice a day 14)  Depakote Er 500 Mg Xr24h-tab (Divalproex sodium) .... Take two tabs daily (in place of 250mg  two tabs twice a day) 15)  Gabapentin 100 Mg Caps (Gabapentin) .... Take 2 pills in the morning, 2 pills at night for 1 month, (400mg  a day) then call your md  for further instructions  Other Orders: Diagnostic X-Ray/Fluoroscopy (Diagnostic X-Ray/Flu)  Patient Instructions: 1)  Increase your Lantus to 55 units every morning and make sure you are eating 3 meals a day.  2)  You are probably not seeing a lot of improvement witht he Gabapentin because it is at a low dose, increase your dose to 2 pills every morning and 2 pills every evening.  3)  We are going to get an x-ray of your back today. We will call you with results.  4)  We are testing a compound in the blood for muscle breakdown. We  will call you with results.  Prescriptions: GABAPENTIN 100 MG CAPS (GABAPENTIN) take 2 pills in the morning, 2 pills at night for 1 month, (400mg  a day) then call your MD for further instructions  #180 x 0   Entered and Authorized by:   Jamie Brookes MD   Signed by:   Jamie Brookes MD on 02/13/2010   Method used:   Historical   RxID:   [id number] LANTUS SOLOSTAR 100 UNIT/ML SOLN (INSULIN GLARGINE) inject 55 units subcutaneously q am.  increase as directed by your doctor.  Dispense QS 1 month Brand medically necessary #100 x 1   Entered and Authorized by:   Jamie Brookes MD   Signed by:   Jamie Brookes MD on 02/13/2010   Method used:   Electronically to        [facility] (retail)       [address]       [place], [place]  [address]       Ph: [phone]       Fax: [phone]   RxID:   [id number]    Orders Added: 1)  A1C-[facility] [83036] 2)  CK (Creatine Kinase)-[facility] [82550-23250] 3)  Diagnostic X-Ray/Fluoroscopy [Diagnostic X-Ray/Flu] 4)  [facility]- Est  Level 4 [74259]    Laboratory Results   Blood Tests   Date/Time Received: February 13, 2010 8:52 AM  Date/Time Reported: February 13, 2010 11:01 AM   HGBA1C: 8.0%   (Normal Range: Non-Diabetic - 3-6%   Control Diabetic - 6-8%)  Comments: ...............test performed by.................Marland KitchenGaren Grams, LPN .............entered by...........Marland KitchenBonnie A. [place], MLS (ASCP)cm

## 2010-02-22 NOTE — Progress Notes (Signed)
Summary: results  ---- Converted from flag ---- ---- 02/13/2010 10:42 PM, Jamie Brookes MD wrote: Please let the patient know that her back x-ray and the blood test for her muscles were normal. Encourage her to keep her apointment with the mental health provider. And if she continues to have diarrhea she can get immodium over the counter and follow thedirections on the bottle. thanks. ------------------------------  spoke with pt and informed her of her results, take the immodium for diarrhea, and to keep appt with mental health

## 2010-03-09 ENCOUNTER — Other Ambulatory Visit: Payer: Self-pay | Admitting: Family Medicine

## 2010-03-09 MED ORDER — QUINAPRIL HCL 10 MG PO TABS: 10.0000 mg | ORAL_TABLET | Freq: Every day | ORAL | Status: DC

## 2010-03-12 ENCOUNTER — Telehealth: Payer: Self-pay | Admitting: Family Medicine

## 2010-03-12 NOTE — Telephone Encounter (Signed)
Patient dropped off FMLA form to be filled out.  Please fax when completed and also call her to pick up a copy of it.

## 2010-03-12 NOTE — Telephone Encounter (Signed)
Form filled out, I asked to scan to EMR.  Placed in fax pile

## 2010-03-15 ENCOUNTER — Ambulatory Visit (INDEPENDENT_AMBULATORY_CARE_PROVIDER_SITE_OTHER): Payer: Self-pay | Admitting: Family Medicine

## 2010-03-15 ENCOUNTER — Encounter: Payer: Self-pay | Admitting: Family Medicine

## 2010-03-15 VITALS — BP 136/94 | HR 74 | Temp 98.2°F | Ht 59.0 in | Wt 232.4 lb

## 2010-03-15 DIAGNOSIS — T7840XA Allergy, unspecified, initial encounter: Secondary | ICD-10-CM

## 2010-03-15 DIAGNOSIS — E119 Type 2 diabetes mellitus without complications: Secondary | ICD-10-CM

## 2010-03-15 DIAGNOSIS — Z91010 Allergy to peanuts: Secondary | ICD-10-CM

## 2010-03-15 DIAGNOSIS — H409 Unspecified glaucoma: Secondary | ICD-10-CM

## 2010-03-15 DIAGNOSIS — E785 Hyperlipidemia, unspecified: Secondary | ICD-10-CM

## 2010-03-15 DIAGNOSIS — E1149 Type 2 diabetes mellitus with other diabetic neurological complication: Secondary | ICD-10-CM

## 2010-03-15 DIAGNOSIS — I10 Essential (primary) hypertension: Secondary | ICD-10-CM

## 2010-03-15 LAB — CONVERTED CEMR LAB
CO2: 25 meq/L (ref 19–32)
Cholesterol: 172 mg/dL (ref 0–200)
Creatinine, Ser: 0.65 mg/dL (ref 0.40–1.20)
Glucose, Bld: 169 mg/dL — ABNORMAL HIGH (ref 70–99)
HCT: 40.6 % (ref 36.0–46.0)
MCV: 85.8 fL (ref 78.0–100.0)
Platelets: 318 10*3/uL (ref 150–400)
RBC: 4.73 M/uL (ref 3.87–5.11)
Total Bilirubin: 0.2 mg/dL — ABNORMAL LOW (ref 0.3–1.2)
Total CHOL/HDL Ratio: 4.1
Triglycerides: 105 mg/dL (ref <150)
VLDL: 21 mg/dL (ref 0–40)
WBC: 6.6 10*3/uL (ref 4.0–10.5)

## 2010-03-15 LAB — LIPID PANEL
Cholesterol: 172 mg/dL (ref 0–200)
Total CHOL/HDL Ratio: 4.1 Ratio
Triglycerides: 105 mg/dL (ref <150)
VLDL: 21 mg/dL (ref 0–40)

## 2010-03-15 LAB — CBC
HCT: 40.6 % (ref 36.0–46.0)
Hemoglobin: 13.3 g/dL (ref 12.0–15.0)
MCH: 28.1 pg (ref 26.0–34.0)
MCHC: 32.8 g/dL (ref 30.0–36.0)
MCV: 85.8 fL (ref 78.0–100.0)

## 2010-03-15 LAB — COMPREHENSIVE METABOLIC PANEL
AST: 14 U/L (ref 0–37)
Alkaline Phosphatase: 69 U/L (ref 39–117)
BUN: 11 mg/dL (ref 6–23)
Creat: 0.65 mg/dL (ref 0.40–1.20)
Total Bilirubin: 0.2 mg/dL — ABNORMAL LOW (ref 0.3–1.2)

## 2010-03-15 MED ORDER — ATORVASTATIN CALCIUM 20 MG PO TABS: 20.0000 mg | ORAL_TABLET | Freq: Every day | ORAL | Status: DC

## 2010-03-15 MED ORDER — INSULIN ASPART 100 UNIT/ML ~~LOC~~ SOLN: 4.0000 [IU] | Freq: Three times a day (TID) | SUBCUTANEOUS | Status: DC

## 2010-03-15 MED ORDER — INSULIN GLARGINE 100 UNIT/ML ~~LOC~~ SOLN: SUBCUTANEOUS | Status: DC

## 2010-03-15 NOTE — Progress Notes (Signed)
  Subjective:    Patient ID: Margaret Larsen, female    DOB: 1970-11-23, 40 y.o.   MRN: [id number]  HPI DIABETES  Taking and tolerating: yes Lantus 60 units Fasting blood sugars:150-160's  Hypoglycemic symptoms: no Visual problems: no Monitoring feet: yes Numbness/Tingling: yes Last eye exam:> 1 year Diabetic Labs:  Lab Results  Component Value Date   HGBA1C 8.0 02/13/2010   HGBA1C 7.9 11/20/2009   HGBA1C 7.6 07/07/2009   Lab Results  Component Value Date   CREATININE 0.86 07/17/2009   Last microalbumin: No results found for this basename: MICROALBUR, [id number]   HYPERTENSION  BP Readings from Last 3 Encounters:  03/15/10 136/94  02/13/10 98/65  01/23/10 110/72    Hypertension ROS: taking medications as instructed, no medication side effects noted, no TIA's, no chest pain on exertion, no dyspnea on exertion, no swelling of ankles and no intermittent claudication symptoms.   HYPERLIPIDEMIA  Diet: Not following low cholesterol diet and not taking statin Exercise: No regular exercise Wt Readings from Last 3 Encounters:  03/15/10 232 lb 6.4 oz (105.416 kg)  02/13/10 231 lb (104.781 kg)  01/23/10 231 lb 6.1 oz (104.954 kg)   ROS:  Denies RUQ pain, myalgias, or symptoms or coronary ischemia No results found for this basename: [facility]   Lab Results  Component Value Date   CHOL 177 07/29/2008   CHOL 172 07/27/2007   Lab Results  Component Value Date   HDL 42 07/29/2008   HDL 45 2/84/1324   Lab Results  Component Value Date   TRIG 105 07/29/2008   TRIG 136 07/27/2007   Lab Results  Component Value Date   ALT <8 U/L 07/17/2009   AST 13 07/17/2009   ALKPHOS 68 07/17/2009   BILITOT 0.3 07/17/2009            Review of Systems SEE HPI     Objective:   Physical Exam  Constitutional: She appears well-developed and well-nourished.       Poor eye contact  HENT:  Head: Normocephalic and atraumatic.  Cardiovascular: Normal rate and regular rhythm.  Exam reveals  no gallop and no friction rub.   No murmur heard. Pulmonary/Chest: Effort normal and breath sounds normal. No respiratory distress. She has no wheezes. She has no rales.  Musculoskeletal: She exhibits no edema.  Psychiatric: Her affect is blunt.          Assessment & Plan:

## 2010-03-15 NOTE — Assessment & Plan Note (Signed)
Patient not taking statin because she states she did not understand why it was important and she thought previous provider said her cholesterol was fine and this was just for "prevention"  We discussed risks of MI, stroke and her risk factors of DM, HTN, obesity.  She agreed to restart statin.  Will check today for baseline.

## 2010-03-15 NOTE — Assessment & Plan Note (Signed)
Continues to have pain, patient states neurontin not effective.  She was taking only 100 mg tablets.  She is not open to increasing dose at this time. Advised we can revisit this in the future.

## 2010-03-15 NOTE — Assessment & Plan Note (Addendum)
Lab Results  Component Value Date   HGBA1C 8.0 02/13/2010   Poorly controlled Not taking an oral medication because she states she cannot tolerate metformin.  She has titrated up lantus from 50-60 units from another provider.  Patient asks for another insulin to control her blood sugar better  We discussed that this would involve more frequent CBG's and she states she already checks 3-4 times daily and would not mind additional injections.  Will decrease lantus to 50 units and ask patient to start with 4 units with each meal.  Patient to keep CBG log and bring to next appt in 2 weeks.

## 2010-03-15 NOTE — Assessment & Plan Note (Addendum)
Discussed BP goals.  No change in meds today.

## 2010-03-15 NOTE — Patient Instructions (Addendum)
Take 50 units of lantus each morning Check your blood sugar first thing before eating, and before each meal (3 meals per day) Write these down, and bring to your next appointment Will add a short acting meal insulin- take 4 units before each meal.   Follow-up in 2-3 weeks Working on weight loss with exercise will help you sleep and your breathing. You said that by the next time I see you, you can walk 3 times a week, 20 minutes at a time. Follow-up in 2 weeks

## 2010-03-15 NOTE — Assessment & Plan Note (Addendum)
Patient' prior referral not completed.  Patient states has epi-pen.  Will try again

## 2010-03-20 ENCOUNTER — Other Ambulatory Visit: Payer: Self-pay | Admitting: Family Medicine

## 2010-03-20 DIAGNOSIS — E785 Hyperlipidemia, unspecified: Secondary | ICD-10-CM

## 2010-03-20 LAB — GLUCOSE, CAPILLARY: Glucose-Capillary: 131 mg/dL — ABNORMAL HIGH (ref 70–99)

## 2010-03-20 MED ORDER — ATORVASTATIN CALCIUM 20 MG PO TABS: 20.0000 mg | ORAL_TABLET | Freq: Every day | ORAL | Status: DC

## 2010-03-21 ENCOUNTER — Other Ambulatory Visit: Payer: Self-pay | Admitting: Family Medicine

## 2010-03-21 DIAGNOSIS — E785 Hyperlipidemia, unspecified: Secondary | ICD-10-CM

## 2010-03-21 LAB — CBC
MCH: 28.4 pg (ref 26.0–34.0)
MCHC: 33.5 g/dL (ref 30.0–36.0)
Platelets: 382 10*3/uL (ref 150–400)

## 2010-03-21 LAB — BASIC METABOLIC PANEL
CO2: 29 mEq/L (ref 19–32)
Calcium: 9 mg/dL (ref 8.4–10.5)
Creatinine, Ser: 0.56 mg/dL (ref 0.4–1.2)
GFR calc Af Amer: >60 mL/min (ref >60)

## 2010-03-21 MED ORDER — ATORVASTATIN CALCIUM 20 MG PO TABS: 20.0000 mg | ORAL_TABLET | Freq: Every day | ORAL | Status: DC

## 2010-03-21 MED ORDER — IBUPROFEN 800 MG PO TABS: 800.0000 mg | ORAL_TABLET | Freq: Three times a day (TID) | ORAL | Status: DC | PRN

## 2010-03-22 LAB — CBC
Hemoglobin: 13.9 g/dL (ref 12.0–15.0)
MCH: 28.8 pg (ref 26.0–34.0)
RBC: 4.82 MIL/uL (ref 3.87–5.11)

## 2010-03-22 LAB — COMPREHENSIVE METABOLIC PANEL
ALT: 13 U/L (ref 0–35)
AST: 19 U/L (ref 0–37)
Alkaline Phosphatase: 77 U/L (ref 39–117)
CO2: 30 mEq/L (ref 19–32)
Chloride: 100 mEq/L (ref 96–112)
Creatinine, Ser: 0.67 mg/dL (ref 0.4–1.2)
GFR calc Af Amer: >60 mL/min (ref >60)
GFR calc non Af Amer: >60 mL/min (ref >60)
Potassium: 3.6 mEq/L (ref 3.5–5.1)
Sodium: 137 mEq/L (ref 135–145)
Total Bilirubin: 0.2 mg/dL — ABNORMAL LOW (ref 0.3–1.2)

## 2010-03-22 LAB — URINALYSIS, ROUTINE W REFLEX MICROSCOPIC
Bilirubin Urine: NEGATIVE
Nitrite: NEGATIVE
Specific Gravity, Urine: 1.025 (ref 1.005–1.030)
pH: 6 (ref 5.0–8.0)

## 2010-03-22 LAB — WET PREP, GENITAL: Trich, Wet Prep: NONE SEEN

## 2010-03-22 LAB — DIFFERENTIAL
Basophils Absolute: 0 10*3/uL (ref 0.0–0.1)
Basophils Relative: 0 % (ref 0–1)
Eosinophils Absolute: 0.1 10*3/uL (ref 0.0–0.7)
Eosinophils Relative: 2 % (ref 0–5)

## 2010-03-22 LAB — GC/CHLAMYDIA PROBE AMP, GENITAL: Chlamydia, DNA Probe: NEGATIVE

## 2010-03-22 LAB — POCT PREGNANCY, URINE: Preg Test, Ur: NEGATIVE

## 2010-03-23 ENCOUNTER — Encounter: Payer: Self-pay | Admitting: Family Medicine

## 2010-03-30 ENCOUNTER — Ambulatory Visit: Payer: Self-pay | Admitting: Family Medicine

## 2010-04-09 ENCOUNTER — Telehealth: Payer: Self-pay | Admitting: Family Medicine

## 2010-04-09 NOTE — Telephone Encounter (Signed)
Pt is out of insulin and is waiting to get it filled through [facility].  It hasn't come in yet and wants to know if we have any samples to help her out

## 2010-04-09 NOTE — Telephone Encounter (Signed)
Advised patient that we have no samples of Lantus. We do have Novolog but she has this on hand and does not need.

## 2010-04-13 LAB — POCT I-STAT, CHEM 8
Chloride: 102 mEq/L (ref 96–112)
Creatinine, Ser: 0.7 mg/dL (ref 0.4–1.2)
Glucose, Bld: 148 mg/dL — ABNORMAL HIGH (ref 70–99)
Potassium: 3.4 mEq/L — ABNORMAL LOW (ref 3.5–5.1)
Sodium: 139 mEq/L (ref 135–145)

## 2010-04-18 ENCOUNTER — Inpatient Hospital Stay (INDEPENDENT_AMBULATORY_CARE_PROVIDER_SITE_OTHER)
Admission: RE | Admit: 2010-04-18 | Discharge: 2010-04-18 | Disposition: A | Discharge status: Home / Self Care | Payer: Self-pay | Source: Ambulatory Visit | Attending: Emergency Medicine | Admitting: Emergency Medicine

## 2010-04-18 DIAGNOSIS — J029 Acute pharyngitis, unspecified: Secondary | ICD-10-CM

## 2010-04-19 ENCOUNTER — Encounter: Payer: Self-pay | Admitting: Family Medicine

## 2010-04-19 ENCOUNTER — Ambulatory Visit (INDEPENDENT_AMBULATORY_CARE_PROVIDER_SITE_OTHER): Payer: Self-pay | Admitting: Family Medicine

## 2010-04-19 VITALS — BP 131/87 | HR 87 | Temp 98.4°F | Wt 229.0 lb

## 2010-04-19 DIAGNOSIS — R52 Pain, unspecified: Secondary | ICD-10-CM

## 2010-04-19 MED ORDER — OSELTAMIVIR PHOSPHATE 75 MG PO CAPS: 75.0000 mg | ORAL_CAPSULE | Freq: Two times a day (BID) | ORAL | Status: AC

## 2010-04-19 NOTE — Progress Notes (Signed)
Pt has had body aches in her ribs, chest and back as well as throat pain and a productive cough. She has a sick contact. She was seen in the urgent care yesterday and had a negative strep throat test. She has some nasal congestion and has had some nausea and vomiting of foods but is keeping liquids down.   ROS: no fever or chills reported. Vomiting is only related to good, can keep liquids down.   PE:  Gen:Pt appears to not feel well. She is wearing a jacket.  HEENT: [place]/AT, EOMI, post-pharynx without erythema, no cervical LAD, no swelling of turbinates. CV: rrr, no m/r/g Pulm: CTAB, no wheezes or crackles.  Abd: soft, non-tender.

## 2010-04-19 NOTE — Assessment & Plan Note (Signed)
Pt has had body aches in her ribs, chest and back as well as throat pain and a productive cough. She has a sick contact. She was seen in the urgent care yesterday and had a negative strep throat test. She has some nasal congestion and has had some nausea and vomiting of foods but is keeping liquids down.  Plan to treat her with Tamilflu for 5 days and give her 48 hours to rest and drink plenty of water.  Pt advised to go to the ED if she feels like she is getting dehydrated or not keeping down fluids. Pt agreed.

## 2010-04-19 NOTE — Patient Instructions (Addendum)
You appear to have the flu virus. Try to get plenty of rest and drink plenty of water.  You can go back to work on Saturday if you work on the weekends.

## 2010-04-23 LAB — GLUCOSE, CAPILLARY: Glucose-Capillary: 90 mg/dL (ref 70–99)

## 2010-04-26 ENCOUNTER — Inpatient Hospital Stay (INDEPENDENT_AMBULATORY_CARE_PROVIDER_SITE_OTHER)
Admission: RE | Admit: 2010-04-26 | Discharge: 2010-04-26 | Disposition: A | Discharge status: Home / Self Care | Payer: Self-pay | Source: Ambulatory Visit | Attending: Family Medicine | Admitting: Family Medicine

## 2010-04-26 ENCOUNTER — Ambulatory Visit (INDEPENDENT_AMBULATORY_CARE_PROVIDER_SITE_OTHER): Payer: Self-pay

## 2010-04-26 DIAGNOSIS — J069 Acute upper respiratory infection, unspecified: Secondary | ICD-10-CM

## 2010-04-26 DIAGNOSIS — J45909 Unspecified asthma, uncomplicated: Secondary | ICD-10-CM

## 2010-05-22 NOTE — Op Note (Signed)
NAME:  Margaret Larsen, Margaret Larsen NO.:  [id number]   MEDICAL RECORD NO.:  [id number]          PATIENT TYPE:  EMS   LOCATION:  ED                           FACILITY:  [facility]   PHYSICIAN:  Boston Service, M.D.DATE OF BIRTH:  15-Jun-1970   DATE OF PROCEDURE:  DATE OF DISCHARGE:                               OPERATIVE REPORT   REFERRING PHYSICIAN:  1. Dr. Sandria Manly, [facility].  2. Dr. Mariel Aloe.   PREOPERATIVE DIAGNOSIS:  A 40 year old female.  Computed tomography scan  indicates of 5 mm calculus within the right distal ureter.  Scan also  shows changes consistent with pyelonephritis on that side, and lab  studies show a white count 15,000.  Urinalysis shows numerous bacteria  and white cells.   POSTOPERATIVE DIAGNOSIS:  A 40 year old female.  Computed tomography  scan indicates of 5 mm calculus within the right distal ureter.  Scan  also shows changes consistent with pyelonephritis on that side, and lab  studies show a white count 15,000.  Urinalysis shows numerous bacteria  and white cells.   PROCEDURE:  Cystoscopy, retrograde double-J stent placement.   ANESTHESIA:  General.   DRAINS:  6-French 24 cm double-J stent.   ESTIMATED BLOOD LOSS:  Minimal.   COMPLICATIONS:  None obvious.   DESCRIPTION OF PROCEDURE:  The patient was prepped and draped in the  dorsal lithotomy position after institution of an adequate level of  general anesthesia.  A well-lubricated 21-French panendoscope was gently  inserted at the urethral meatus.  Normal urethra and sphincter.  Clear  reflux left orifice.  Minimal proven reflux at the right orifice.  Blocking catheter was selected, positioned at the left and then the  right ureteral orifice.  Gentle injection of contrast showed a fine and  delicate ureter on the left with, a small intrarenal pelvis, fine and  delicate calyces, prompt drainage at 3-5 minutes.  On the right side,  the patient showed a dilated ureter, dilated  pelvis and calyces, with  small filling defect bilaterally.  As soon as the catheter was  negotiated beyond the filling defect, there was prompt reflux of  purulent urine from the right ureteral orifice.  Decision made not to  manipulate the stone.  Guidewire was advanced into what appeared to be  dilated upper pole calyces.  A 6-French 24 cm double-J stent was  then advanced over the guidewire, with pigtail formation on guidewire  removal both in the right renal pelvis and in the bladder.  Bladder was  drained.  The patient was given a B & O suppository and returned to  recovery in satisfactory condition.           ______________________________  Boston Service, M.D.     RH/MEDQ  D:  12/20/2006  T:  12/22/2006  Job:  [id number]   cc:   Wilhemina Bonito, M.D.

## 2010-05-22 NOTE — Op Note (Signed)
NAME:  Margaret Larsen, Margaret Larsen            ACCOUNT NO.:  [id number]   MEDICAL RECORD NO.:  [id number]          PATIENT TYPE:  AMB   LOCATION:  [facility]                         FACILITY:  [facility]   PHYSICIAN:  Boston Service, M.D.DATE OF BIRTH:  1970-10-22   DATE OF PROCEDURE:  01/13/2007  DATE OF DISCHARGE:                               OPERATIVE REPORT   REFERRING PHYSICIAN:  Genene Churn. Love, MD [facility].   PREOP DIAGNOSIS:  A 40 year old black female, 4 feet 11 inches, 215  pounds, right sided pyelonephritis, and a right UVJ stone.  Stent was  placed.  The patient treated with antibiotics for 2 weeks,. returns  today for treatment of the stone.   POSTOP DIAGNOSIS:  A 40 year old black female, 4 feet 11 inches, 215  pounds, right sided pyelonephritis, and a right UVJ stone.  Stent was  placed.  The patient treated with antibiotics for 2 weeks,. returns  today for treatment of the stone.   PROCEDURE:  1. Cystoscopy.  2. Retrograde.  3. Ureteroscopy.  4. Holmium laser fragmentation of 6 mm right ureterovesical junction      stone.   SURGEON:  Boston Service, M.D.   ASSISTANT:  None.   ANESTHESIA:  General.   FINDINGS:  6 mm right UVJ stone.   COMPLICATIONS:  None obvious.   DESCRIPTION OF PROCEDURE:  The patient was prepped and draped in the  dorsal lithotomy position after institution of an adequate level of  general anesthesia.  A well lubricated 21-French panendoscope was gently  inserted at the urethral meatus, retrograde on the left side had been  done at the time of stent placement, and was not repeated.  Right double-  J stent was grasped, brought out through the urethral meatus, and  cannulated with the guidewire which appeared to coil nicely in upper  pole calyces.  Double-J stent was removed and the short 6-French  ureteroscope was inserted alongside the guidewire.  Initial attempt was  made to negotiate the stone into a flat wire basket.  However, ureter  was somewhat narrowed; and basket was withdrawn.  A 365 holmium fiber  was inserted at a joules setting of 0.5.  Stone was then gradually  fragmented over period of about 20 minutes, leaving behind mostly very  small fragments less than 1 mm or so, and no double-J stent was left in  place.   Once all fragments were quite fine, or had been washed from the ureter;  ureteroscope was withdrawn.  Bladder was drained.  The patient was given  a B&O suppository, and then returned to recovery in satisfactory  condition.           ______________________________  Boston Service, M.D.     RH/MEDQ  D:  01/13/2007  T:  01/13/2007  Job:  [id number]   cc:   Evie Lacks, MD  Fax: [phone]

## 2010-05-25 NOTE — H&P (Signed)
NAME:  Margaret Larsen, Margaret Larsen                      ACCOUNT NO.:  [id number]   MEDICAL RECORD NO.:  [id number]                   PATIENT TYPE:  INP   LOCATION:  9304                                 [facility]ILITY[facility]   PHYSICIAN:  Naima A. Dillard, M.D.              DATE OF BIRTH:  Jan 20, 1970   DATE OF ADMISSION:  01/20/2002  DATE OF DISCHARGE:                                HISTORY & PHYSICAL   HISTORY OF PRESENT ILLNESS:  The patient is a 40 year old gravida  __________, para 4-0-0-4 status post cesarean section on January 14, 2002.  She had a repeat cesarean section for increased blood pressures and negative  urine protein at that time.  Her PIH laboratories on admission for her  cesarean section were within normal limits.  She was not treated with any  blood pressure medicine and on this her sixth postpartum day she was seen by  her [facility] nurse in her home and blood pressure was elevated.  She is  complaining of headache and blurry vision and was sent to the hospital for  evaluation.  On admission to [facility] her blood pressures have been elevated  169/108, 173/107, 182/106, 180/99, 177/106.  Her pulse is 70, her  respirations 20, and her temperature 98.7.  Per Naima A. Dillard, M.D. she  is admitted for 23-hour observation, 24-hour urine, and blood pressure  management.  Her history is remarkable for cesarean section x4 and tubal  ligation with her last cesarean section being January 14, 2002 and tubal  ligation at that time.  She has a history of preeclampsia, a history of LGA  infant.  She is obese with some history of hypertension during past  pregnancies.   OB HISTORY:  In 1989 she had a primary section for a female infant at term 7  pounds 6 ounces.  In 1991 a repeat cesarean section of a female 9 pounds 12  ounces at term.  In 1996 a repeat cesarean section of a female infant 7  pounds 4 ounces at term.  This pregnancy she had a cesarean section on  January 14, 2002 with the birth of a  7 pound 6 ounce female infant at term.   PAST MEDICAL HISTORY:  Unremarkable.   PAST SURGICAL HISTORY:  As stated above, cesarean sections.   FAMILY HISTORY:  The patient's mother with MI.  Maternal grandfather heart  attack.  Maternal grandmother, maternal uncle, and maternal aunt with  hypertension.  The patient's mother diabetic.  Maternal grandmother oral  medications, diabetic.  Maternal aunt and uncle diabetic also.  The  patient's mother had Hodgkin's disease in 1988 with chemo and a bone marrow  transplant.   ALLERGIES:  This patient has no known drug allergies.   SOCIAL HISTORY:  Denies the use of tobacco, alcohol, or illicit drugs.  This  patient is single.  The father of the baby's name is [name].  He  is  involved and supportive.   REVIEW OF SYSTEMS:  As described above.  The patient is six days postpartum  with increased blood pressure and negative urine.  She is admitted for 23-  hour observation for 24-hour urine and blood pressure management.   PHYSICAL EXAMINATION:  VITAL SIGNS:  Her blood pressures have all been  elevated 169/108, 173/107, 182/106, 180/99, 177/106.  Pulse is 70,  respirations 20, temperature 99.8.  Her catheterized UA is negative for  protein.  CBC:  WBCs are 7.8, hemoglobin 10.1, hematocrit 30.0, platelets  473,000.  Her discharge CBC:  WBCs 6.9, hemoglobin 9.7, hematocrit 28.2,  platelets 315,000.  Her uric acid today is 7.5, LDH 275, SGOT 17, SGPT less  than 19, alkaline phosphatase 128.  GENERAL:  She is alert and oriented x3.  HEENT:  Unremarkable.  HEART:  Regular rate and rhythm.  LUNGS:  Clear.  ABDOMEN:  Soft and nontender.  Her incision is clean, dry, and intact, well  approximated, and healing without any signs of infection.  She has 1+ edema  in her lower extremities.  DTRs are 1+ with no clonus.   ASSESSMENT:  Six days status post cesarean section with hypertension.  Admit  for 23-hour observation per Naima A. Dillard, M.D.  for 24-hour urine and  blood pressure management.     [name], C.N.M.               Naima A. Normand Sloop, M.D.    SDM/MEDQ  D:  01/20/2002  T:  01/20/2002  Job:  [id number]

## 2010-05-25 NOTE — H&P (Signed)
NAME:  Margaret Larsen, Margaret Larsen                      ACCOUNT NO.:  [id number]   MEDICAL RECORD NO.:  [id number]                   PATIENT TYPE:  MAT   LOCATION:  MATC                                 [facility]   PHYSICIAN:  Naima A. Dillard, M.D.              DATE OF BIRTH:  [birth year]   DATE OF ADMISSION:  12/02/2001  DATE OF DISCHARGE:                                HISTORY & PHYSICAL   HISTORY OF PRESENT ILLNESS:  The patient is a 40 year old gravida 4, para 3-  0-0-3 who presents at 50 and 6/7 weeks with complaint of nausea and vomiting  x2 days.  No diarrhea.  She complains of cold symptoms with nasal congestion  and cough with a temperature of 100.6 x2 days.  In the office of [facility] her  clean catch urine showed 3+ ketones and she was admitted to [facility]  [facility] for IV fluid hydration.  The baby's heart rate has remained  tachycardic in the 170s with positive variability and positive  accelerations, occasional mild variable decelerations.  She is not  contracting at all.  Her temperature has remained 100.6, although is now  down to 99 after receiving Tylenol.  Her CBC finds WBC 7.2 with neutrophils  83.  Per Dr. Jaymes Graff she is to be admitted for 23-hour observation.  This patient's pregnancy has been managed at the office of [facility] by the M.D.  service.  She was initially evaluated at approximately [redacted] weeks gestation.  EDC determined by early pregnancy ultrasonography.  The patient's pregnancy  has been complicated with nausea and vomiting throughout.  She has had some  cramping and contractions and an abnormal AFP was found but patient declined  amniocentesis.  The patient has had several ultrasounds which have showed  adequate growth and normal anatomy.  At 26 weeks a new left ventricular EIF  was noted on ultrasound.  She has been normotensive throughout her pregnancy  with no proteinuria.  She was begun on Hemocyte b.i.d. for iron deficiency  anemia.  Her  pregnancy is remarkable for previous cesarean section x3,  history of PIH and preeclampsia, history of a large for gestational age  infant, and first trimester spotting.   OB HISTORY:  In 1989 patient had a primary low transverse cesarean section  with the birth of a 7 pound 6 ounce female infant.  She had a complication  of PIH, bleeding, and needed a blood transfusion following delivery.  In  1991 patient had a low transverse cesarean section with the birth of a 9  pound 12.5 ounce female infant at term.  In 1996 patient had a low transverse  cesarean section with the birth of a 7 pound 4 ounce female infant.  Her  pregnancies have been complicated by hypertension at term.   PAST MEDICAL HISTORY:  Unremarkable.  She did have a blood transfusion in  1989 after her first cesarean section.  FAMILY HISTORY:  The patient's mother with MI.  Maternal grandfather with  MI.  The patient's mother, maternal grandmother, maternal uncles, and  maternal aunt with hypertension.  The patient's mother and maternal aunt and  maternal grandmother, maternal uncle have a history of diabetes.  The  patient's mother with history of Hodgkin's disease.  Maternal grandfather  with an unknown type of cancer.   PAST SURGICAL HISTORY:  Cesarean section x3 and laparoscopic cholecystectomy  in 1996.   GENETIC HISTORY:  The patient's two older daughters with sickle cell trait.   SOCIAL HISTORY:  The patient is a single African-American female.  The  father of the baby, Margaret Larsen, is involved and supportive.  They  belong to the [facility] faith.   REVIEW OF SYSTEMS:  As described above.  The patient has had nausea and  vomiting, nasal congestion, and cough x2 days with a maternal temperature of  100.6.   PHYSICAL EXAMINATION:  VITAL SIGNS:  Stable.  Her temperature is now 99,  pulse 109, blood pressure 116/66, respirations 20.  HEENT:  Unremarkable.  HEART:  Regular rate and rhythm.  LUNGS:  Clear to  auscultation bilaterally.  ABDOMEN:  Soft and nontender.  It is gravid in its contour.  Uterine fundus  is noted to extend 32 cm above the level of the pubic symphysis.  Leopold's  maneuver finds the infant to be in variable presentation with an estimated  fetal weight of 3 pounds.  Negative CVA tenderness.  EXTREMITIES:  No pathologic edema.   LABORATORIES:  Fetal heart rate is tachycardic in the 170s with positive  variability, positive accelerations, and occasional mild variable  decelerations.  The patient is not contracting.  CBC finds WBC 7.2,  hemoglobin and hematocrit 10.3 and 30.6, platelets 304,000, neutrophils 83.   ASSESSMENT:  1. Intrauterine pregnancy at 86 and 6/7 weeks.  2. Nausea and vomiting.  3. Maternal temperature to 100.6.   PLAN:  Admit for 23-hour observation.  Continuous electronic fetal  monitoring and IV fluid hydration.  Phenergan p.r.n.  Comprehensive  metabolic panel, catheterized UA for C&S.  Dr. Normand Sloop will come to evaluate  patient.     [name], C.N.M.               Naima A. Normand Sloop, M.D.    SDM/MEDQ  D:  12/02/2001  T:  12/02/2001  Job:  [id number]

## 2010-05-25 NOTE — Discharge Summary (Signed)
   NAME:  Margaret Larsen, Margaret Larsen                      ACCOUNT NO.:  [id number]   MEDICAL RECORD NO.:  [id number]                   PATIENT TYPE:  INP   LOCATION:  9153                                 [facility]   PHYSICIAN:  Hal Morales, M.D.             DATE OF BIRTH:  1970/10/18   DATE OF ADMISSION:  12/02/2001  DATE OF DISCHARGE:  12/03/2001                                 DISCHARGE SUMMARY   ADMITTING DIAGNOSES:  1. Intrauterine pregnancy at 31-6/7 weeks.  2. Probable viral syndrome.   DISCHARGE DIAGNOSES:  1. Intrauterine pregnancy at 31-6/7 weeks.  2. Probable viral syndrome.   PROCEDURES:  None.   PAST MEDICAL HISTORY:  1. Three previous C-sections.  2. History of PIH.  3. History of blood transfusion.  4. Obesity.  5. History of cholecystectomy in 1996.   HOSPITAL COURSE:  The patient is a 40 year old gravida 4, para 3-0-0-3 at 14-  6/7 weeks who was seen in the office on December 02, 2001 for nausea and  vomiting x2 days with temperature of 100.6.  The patient had 3+ ketones in  the office.  She also had fetal tachycardia.  She was sent to maternity  admissions for monitoring and initiation of IV fluid.  The patient continued  to have temperature and continued to have some initial fetal tachycardia,  therefore, she was admitted for 23-hour observation.  She maintained IV  fluids throughout the night, Tylenol on a running basis.  By the morning of  December 03, 2001, the patient was doing much better, she was tolerating  p.o. fluids although she did not have much appetite, she had had a CBC that  was normal and a chemistry panel that was normal.  She was seen by Dr.  Stefano Gaul this morning for Dr. Pennie Rushing.  Fetal heart rate was reactive with  occasional mild variables.  There were no significant contractions noted.  The patient was therefore, deemed to receive full benefit of her hospital  stay and was discharged home.   DISCHARGE INSTRUCTIONS:  The patient is  to increase her rest, to push p.o.  fluids, and to advance her diet slowly as tolerated.   DISCHARGE MEDICATIONS:  1. Phenergan 25 mg one p.o. q.6h. p.r.n. nausea.  2.     Tussionex one teaspoon q.12h. p.r.n. cough.  3. The patient will continue her prenatal vitamins and iron as previously     noted.   DISCHARGE FOLLOWUP:  Discharge followup will occur as scheduled on December 07, 2001 or p.r.n.     Renaldo Reel Emilee Hero, C.N.M.                   Hal Morales, M.D.    VLL/MEDQ  D:  12/03/2001  T:  12/03/2001  Job:  [id number]

## 2010-05-25 NOTE — Discharge Summary (Signed)
NAME:  Margaret Larsen, Margaret Larsen                      ACCOUNT NO.:  [id number]   MEDICAL RECORD NO.:  [id number]                   PATIENT TYPE:  INP   LOCATION:  9142                                 [facility]   PHYSICIAN:  Crist Fat. Rivard, M.D.              DATE OF BIRTH:  1970-04-03   DATE OF ADMISSION:  01/14/2002  DATE OF DISCHARGE:  01/16/2002                                 DISCHARGE SUMMARY   ADMISSION DIAGNOSES:  1. Intrauterine pregnancy at [redacted] weeks gestation.  2. Pregnancy-induced hypertension.  3. Previous cesarean section x3 desiring repeat with bilateral tubal     ligation.  4. Positive group B strep.  5. Rubella negative.   DISCHARGE DIAGNOSES:  1. Intrauterine pregnancy at [redacted] weeks gestation.  2. Pregnancy-induced hypertension.  3. Previous cesarean section x3 desiring repeat with bilateral tubal     ligation status post repeat low transverse cesarean section and bilateral     tubal ligation.  4. Positive group B strep.  5. Rubella negative.  6. Breast feeding.   PROCEDURES THIS ADMISSION:  A repeat low transverse cesarean section for  delivery of a viable female infant named Margaret Larsen, Apgars of 8 and 9 and  weight 7 pounds 6 ounces on January 14, 2002 by Janine Limbo, M.D. and  a bilateral tubal ligation at the same time.   HOSPITAL COURSE:  The patient is a 39 year old black female gravida 4, para  3-0-0-3 at [redacted] weeks gestation who presented initially for observation  secondary to some blood pressure elevations and due to her elevated blood  pressure, decreased fetal movement, nonreactive NST, and desire for a repeat  cesarean section the patient was recommended and elected to proceed with  cesarean section on January 14, 2002.  She underwent the same for delivery of  a viable female infant named Margaret Larsen who had Apgars of 8 and 9 and weighed 7  pounds 6 ounces and was attended and delivered by Janine Limbo, M.D.  and Renaldo Reel. Emilee Hero, C.N.M.  The  patient underwent bilateral tubal ligation  per her request also at the time of cesarean section.  Please see operative  note for further details.  Postoperatively, she has done well.  She is  ambulating, voiding, and eating without difficulty.  She is breast feeding  her infant also without difficulty.  Her vital signs are stable and she has  remained afebrile throughout her hospital course.  She requests discharge  today.   DISCHARGE INSTRUCTIONS:  Her discharge instructions are as per the [facility] OB[facility]N handout.   DISCHARGE MEDICATIONS:  1. Motrin 600 mg p.o. q.6 h. p.r.n. for pain.  2. Tylox 1-2 q.4-6 h. p.r.n. for pain.  The patient has tolerated Tylox in     the hospital without difficulty.    DISCHARGE LABORATORY DATA:  Her hemoglobin is 9.7, her WBC count is 6.9 and  her platelets are 315.  Her RPR was nonreactive.   DISCHARGE FOLLOW UP:  Her discharge follow up will be in six weeks at  [facility] or p.r.n.     Concha Pyo. Duplantis, C.N.M.              Crist Fat Rivard, M.D.    SJD/MEDQ  D:  01/16/2002  T:  01/17/2002  Job:  [id number]

## 2010-05-25 NOTE — Discharge Summary (Signed)
   NAME:  Margaret Larsen, Margaret Larsen                      ACCOUNT NO.:  [id number]   MEDICAL RECORD NO.:  [id number]                   PATIENT TYPE:  INP   LOCATION:  9304                                 [facility]   PHYSICIAN:  Naima A. Dillard, M.D.              DATE OF BIRTH:  Mar 27, 1970   DATE OF ADMISSION:  01/20/2002  DATE OF DISCHARGE:  01/21/2002                                 DISCHARGE SUMMARY   ADMISSION DIAGNOSES:  1. Postoperative cesarean section day #6.  2. Hypertension (uncontrolled).   DISCHARGE DIAGNOSES:  1. Postoperative cesarean section day #6.  2. Hypertension (uncontrolled).  3. No evidence of preeclampsia.   HOSPITAL PROCEDURES:  1. Blood pressure monitoring.  2. Medication.   HOSPITAL COURSE:  The patient was admitted for uncontrolled high blood  pressure with diastolics of 110 in the office and blurred vision and scotoma  combined with headaches since delivery.  She did have a history of high  blood pressure prior to her C section but was not treated for preeclampsia  at that time.  Her PIH labs were normal and on this admission catheterized  UA showed negative protein but a 24-hour urine was started for evaluation of  total protein.  On hospital day #2 her blood pressures were beginning to  normalize at 150 to 171 over 93 to 115 on 100 mg of labetalol.  The patient  was breast feeding her baby and desiring to go home.  Decision was made by  Dr. Estanislado Pandy that she had received the full benefit of her hospital stay and  was discharged home on additional blood pressure medications.   DISCHARGE MEDICATIONS:  1. Labetalol 200 mg p.o. b.i.d.  2. Hydrochlorothiazide 25 mg p.o. daily.   DISCHARGE LABORATORY DATA:  All within normal limits.   DISCHARGE INSTRUCTIONS:  The patient will complete her 24-hour urine at home  and bring it back at 5 p.m. tonight.   DISCHARGE FOLLOW-UP:  On Monday in the office for a blood pressure check and  then at six weeks.     Marie L. Williams, C.N.M.                 Naima A. Normand Sloop, M.D.    MLW/MEDQ  D:  01/21/2002  T:  01/21/2002  Job:  [id number]
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
## 2010-05-25 NOTE — Op Note (Signed)
NAME:  Margaret Larsen, Margaret Larsen                      ACCOUNT NO.:  [id number]   MEDICAL RECORD NO.:  [id number]                   PATIENT TYPE:  INP   LOCATION:  9142                                 FACILITY:  [facility]   PHYSICIAN:  Janine Limbo, M.D.            DATE OF BIRTH:  Jun 27, 1970   DATE OF PROCEDURE:  01/14/2002  DATE OF DISCHARGE:                                 OPERATIVE REPORT   PREOPERATIVE DIAGNOSE:  1. Term intrauterine pregnancy.  2. Prior cesarean section x3.  3. Desires repeat cesarean section.  4. Desires sterilization.  5. Pregnancy-induced hypertension.   POSTOPERATIVE DIAGNOSES:  1. Term intrauterine pregnancy.  2. Prior cesarean section x3.  3. Desires repeat cesarean section.  4. Desires sterilization.  5. Pregnancy-induced hypertension.   PROCEDURE:  Repeat low transverse cesarean section and bilateral tubal  ligation.   SURGEON:  Janine Limbo, M.D.   FIRST ASSISTANT:  Renaldo Reel. Emilee Hero, C.N.M.   ANESTHESIA:  Spinal.   DISPOSITION:  The patient is a 40 year old female, gravida 4, para 3-0-0-3,  who presents having had three prior cesarean sections.  She is scheduled for  repeat cesarean section in one week.  The patient has a history of pregnancy-  induced hypertension, and as a matter of fact has a history of pre-  eclampsia.  Her blood pressure has again started to elevate.  She presented  to maternity admissions area, where she was noted to have D-cells on her  nonstress test.  The decision has been made to proceed with cesarean  delivery.  The patient also desires permanent sterilization.  She  understands the indications for her procedure, and she accepts the risks of,  but not limited to, anesthetic complications, bleeding, infections and  possible damage to surrounding organs.  She understands that there is a  small but real failure rate associated with her procedure (17 per 1000).   FINDINGS:  A 7 pound 6 ounce female infant [name])  was delivered from the  cephalic presentation.  The Apgars were 8 at one minute and 9 at five  minutes.  The uterus, fallopian tubes, and the ovaries were normal for the  gravid state.   DESCRIPTION OF PROCEDURE:  The patient was taken to the operating room,  where a spinal anesthetic was given.  The patient's abdomen, perineum and  vagina were prepped with Betadine.  A Foley catheter was placed in the  bladder.  The patient was sterilely draped.  A low transverse incision was  made in the abdomen and carried sharply through the subcutaneous tissue, the  fascia and the anterior peritoneum.  An incision was made in the lower  uterine segment and extended transversely.  The fetal head was delivered  without difficulty.  The mouth and nose were suctioned.  The remainder of  the infant was delivered.  The cord was clamped and cut and the infant was  handed to the awaiting pediatric  team.  Routine cord blood studies were  obtained.  The placenta was removed.  The uterine cavity was cleaned of  amniotic fluid, clotted blood and membranes.  The uterine incision was  closed using a running, locking suture of  2-0 Vicryl.   The left fallopian tube was identified and followed to its fimbriated end.  The knuckle of tube was made on the left using a pre-tie, and then a tie of  suture ligature of 0 plain catgut.  The knuckle tube thus made was excised.  Hemostasis was adequate.  An identical procedure was carried on the opposite  side.  Again, hemostasis was adequate.   The pelvis was vigorously irrigated.  The anterior peritoneum and the  abdominal musculature were reapproximated in the midline using 2-0 Vicryl.  The fascia was closed using a running suture of  0 Vicryl, followed by three interrupted sutures of 0 Vicryl.  The  subcutaneous area was closed using a running suture of 2-0 Vicryl.  The skin  was reapproximated using a 3-0 suture of Monocryl.  Sponge, needle and  instrument counts were  correct on two occasions.   ESTIMATED BLOOD LOSS:  800 cc.   DISPOSITION:  The patient tolerated the procedure well.  The patient was  taken to the recovery room in stable condition.  The infant was taken to the  full-term nursery in stable condition.                                               Janine Limbo, M.D.    AVS/MEDQ  D:  01/14/2002  T:  01/15/2002  Job:  [id number]

## 2010-07-10 ENCOUNTER — Encounter: Payer: Self-pay | Admitting: Family Medicine

## 2010-07-10 ENCOUNTER — Ambulatory Visit (INDEPENDENT_AMBULATORY_CARE_PROVIDER_SITE_OTHER): Payer: Self-pay | Admitting: Family Medicine

## 2010-07-10 DIAGNOSIS — K649 Unspecified hemorrhoids: Secondary | ICD-10-CM

## 2010-07-10 DIAGNOSIS — R51 Headache: Secondary | ICD-10-CM

## 2010-07-10 DIAGNOSIS — E119 Type 2 diabetes mellitus without complications: Secondary | ICD-10-CM

## 2010-07-10 DIAGNOSIS — R519 Headache, unspecified: Secondary | ICD-10-CM

## 2010-07-10 HISTORY — DX: Unspecified hemorrhoids: K64.9

## 2010-07-10 LAB — POCT GLYCOSYLATED HEMOGLOBIN (HGB A1C): Hemoglobin A1C: 8.3

## 2010-07-10 MED ORDER — HYDROCORTISONE ACE-PRAMOXINE 1-1 % RE FOAM: 1.0000 | Freq: Two times a day (BID) | RECTAL | Status: DC

## 2010-07-10 MED ORDER — TRAMADOL HCL 50 MG PO TABS: 50.0000 mg | ORAL_TABLET | Freq: Four times a day (QID) | ORAL | Status: DC | PRN

## 2010-07-10 MED ORDER — HYDROCORTISONE ACETATE 25 MG RE SUPP: 25.0000 mg | Freq: Two times a day (BID) | RECTAL | Status: DC

## 2010-07-10 NOTE — Assessment & Plan Note (Addendum)
Anoscopy showed several internal hemorrhoids.   Plan to treat with Hydrocortisone suppository and Proctofoam for relief.   Offered both options, patient will see which is cheaper.   Can alternate.  Gave red flags and warnings.

## 2010-07-10 NOTE — Assessment & Plan Note (Signed)
Likely tension headaches. However, cannot rule out migraines from history.   No red flags noted on exam or by history. Plan to treat with Tramadol, short course, as high dose Ibuprofen not helping.  Recommended against 1600 mg ibuprofen at one time. May need further workup to see if she is actually having migraines.

## 2010-07-10 NOTE — Progress Notes (Signed)
  Subjective:    Patient ID: Margaret Larsen, female    DOB: 09-Dec-1970, 40 y.o.   MRN: [id number]  HPI 1.  Rectal pressure and itching:  Has had rectal itching and burning present for at least several weeks, unsure exactly how long.  States this occurs every time she has a bowel movement, every time.  Has been using Preparation H but minimal relief.  Describes loose stools when she does have a bowel movement, which is usual for her.  No BRBPR and no melena.  Occasional nausea, no vomiting.  No fevers or chills.    2.  Headaches:  Patient admits to lots of stress recently.  New 56 month old baby living in house as well.  Has had weekly headaches, several times a week, for past few months.  Has tried Ibuprofen 800 mg, 2 tabs when headache starts and repeated several hours later without relief.  Does occasionally have nausea but no vomiting with headaches, difficult to tease out whether nausea related to bowel movements or headaches.  Some photophobia.  Relieved when she lies in dark room.  Pain is mostly fronto-temporal in nature.  No history of migraines, no neck pain, no numbness/tingling in upper extremities.   3.  DM II:  A1C obtained by nursing staff without my knowledge until after patient left clinic.  Did not discuss this issue with her.   Review of Systems See HPI above for review of systems.       Objective:   Physical Exam Gen:  Alert, cooperative patient who appears stated age in no acute distress.  Vital signs reviewed. HEENT:  [place]/AT.  EOMI, PERRL.  MMM, tonsils non-erythematous, non-edematous.  External ears WNL, Bilateral TM's normal without retraction, redness or bulging.  No papilledema Neck: No masses or thyromegaly or limitation in range of motion.  No cervical lymphadenopathy.  No cervical strain noted.   Rectal: No external hemorrhoids or fissures noted.  Skin tag noted at base of rectum, roughly 6 pm, possible resolved hemorrhoid.  Rectal exam reviewed no stool in vault.   Anoscopy performed: Informed consent obtained and placed in chart.  Time out performed.  Patient placed in left lateral decubitus position.  Disposable anoscope well lubricated with surgical jelly, placed into rectum with minimal discomfort.  Obturator removed and anoscope slowly removed.  Several internal hemorrhoids noted, no bleeding.  Anoscope removed without complication.  Patient tolerated procedure well.         Assessment & Plan:

## 2010-07-10 NOTE — Patient Instructions (Addendum)
For your hemorrhoids, we can try creams or a spray, whatever you think works best.  Tresa Res it a couple of days to get better.   For your headaches, we well try the Tramadol.  It sounds more like a tension headache than a migraine, but a migraine is also a possibility.  In that case, Dr. Earnest Bailey can try some other medicines as well.    Hemorrhoids Hemorrhoids are veins that get big in the rectum. If these veins get blocked, it leads to more problems. When the veins are blocked, it makes them puffy (swollen) and painful. HOME CARE  Eat food that is good for you.   Drink 6 to 8 glasses of water every day or as told by your doctor.   Avoid straining to poop (bowel movement).   Keep the anal area dry and clean.   Only take medicine as told by your doctor.   Your doctor may tell you to take a medicine (laxative) to make you poop. Follow your doctor's instructions.  If your hemorrhoids are puffy and painful:  Take a warm bath for 20 to 30 minutes. Do this 3 to 4 times a day.   If your hemorrhoids are very sore, place ice packs on the area. Use the ice packs between the baths.   Put ice in a plastic bag.   Place a towel between your skin and the bag.   Do not use a donut-shaped pillow. Do not sit on the toilet for a long time.   Go to the bathroom when your body has the urge to poop (bowel movement). This is so you do not strain as much to poop.  GET HELP RIGHT AWAY IF:  You have increasing pain that is not controlled with medicine.   You have uncontrolled bleeding.   You cannot poop.   You have pain outside the area of the hemorrhoids.   You have chills.   You have a temperature by mouth above 101, not controlled by medicine.  MAKE SURE YOU:  Understand these instructions.   Will watch your condition.   Will get help right away if you are not doing well or get worse.  Document Released: 10/03/2007 Document Re-Released: 03/20/2009 [facility] Patient Information 2011  [place], [place].

## 2010-07-13 ENCOUNTER — Other Ambulatory Visit: Payer: Self-pay | Admitting: Family Medicine

## 2010-07-13 ENCOUNTER — Telehealth: Payer: Self-pay | Admitting: Family Medicine

## 2010-07-13 DIAGNOSIS — R519 Headache, unspecified: Secondary | ICD-10-CM

## 2010-07-13 MED ORDER — TRAMADOL HCL 50 MG PO TABS: 50.0000 mg | ORAL_TABLET | Freq: Four times a day (QID) | ORAL | Status: AC | PRN

## 2010-07-13 MED ORDER — HYDROCORTISONE ACE-PRAMOXINE 1-1 % RE FOAM: 1.0000 | Freq: Two times a day (BID) | RECTAL | Status: AC

## 2010-07-13 MED ORDER — HYDROCORTISONE ACETATE 25 MG RE SUPP: 25.0000 mg | Freq: Two times a day (BID) | RECTAL | Status: DC

## 2010-07-13 NOTE — Telephone Encounter (Signed)
Patient notified via voice mail message.

## 2010-07-13 NOTE — Telephone Encounter (Signed)
Will route to Dr. Gwendolyn Grant.

## 2010-07-13 NOTE — Telephone Encounter (Signed)
Came in on 7/3 and got Rx  hydrocortisone-pramoxine [facility]) rectal foam  10 g  0  07/10/2010  07/20/2010   Place 1 applicator rectally 2 (two) times daily. - Rectal   hydrocortisone (ANUSOL-HC) 25 MG suppository  12 suppository  1  07/10/2010  07/10/2011   Place 1 suppository (25 mg total) rectally every 12 (twelve) hours. - Rectal   traMADol (ULTRAM) 50 MG tablet  30 tablet  0  07/10/2010  07/20/2010   Take 1 tablet (50 mg total) by mouth every 6 (six) hours as needed for pain. - Oral      Has lost the Rx.  Can they be sent to [facility] on [address]?  Please call when completed or if there is a problem

## 2010-07-13 NOTE — Telephone Encounter (Signed)
Refilled to [facility] on [address].

## 2010-07-17 ENCOUNTER — Other Ambulatory Visit: Payer: Self-pay | Admitting: *Deleted

## 2010-07-17 MED ORDER — INSULIN ASPART 100 UNIT/ML ~~LOC~~ SOLN: 4.0000 [IU] | Freq: Three times a day (TID) | SUBCUTANEOUS | Status: DC

## 2010-07-23 ENCOUNTER — Inpatient Hospital Stay (INDEPENDENT_AMBULATORY_CARE_PROVIDER_SITE_OTHER)
Admission: RE | Admit: 2010-07-23 | Discharge: 2010-07-23 | Disposition: A | Discharge status: Home / Self Care | Payer: Self-pay | Source: Ambulatory Visit | Attending: Emergency Medicine | Admitting: Emergency Medicine

## 2010-07-23 ENCOUNTER — Ambulatory Visit: Payer: Self-pay | Admitting: Family Medicine

## 2010-07-23 DIAGNOSIS — T7840XA Allergy, unspecified, initial encounter: Secondary | ICD-10-CM

## 2010-07-26 ENCOUNTER — Encounter: Payer: Self-pay | Admitting: Family Medicine

## 2010-07-26 ENCOUNTER — Ambulatory Visit (INDEPENDENT_AMBULATORY_CARE_PROVIDER_SITE_OTHER): Payer: Self-pay | Admitting: Family Medicine

## 2010-07-26 VITALS — BP 134/94 | HR 83 | Temp 98.4°F | Ht 59.0 in | Wt 229.3 lb

## 2010-07-26 DIAGNOSIS — H6690 Otitis media, unspecified, unspecified ear: Secondary | ICD-10-CM

## 2010-07-26 DIAGNOSIS — H6692 Otitis media, unspecified, left ear: Secondary | ICD-10-CM

## 2010-07-26 DIAGNOSIS — Z91010 Allergy to peanuts: Secondary | ICD-10-CM

## 2010-07-26 DIAGNOSIS — F39 Unspecified mood [affective] disorder: Secondary | ICD-10-CM

## 2010-07-26 DIAGNOSIS — T7840XA Allergy, unspecified, initial encounter: Secondary | ICD-10-CM

## 2010-07-26 MED ORDER — AMOXICILLIN 500 MG PO CAPS: 500.0000 mg | ORAL_CAPSULE | Freq: Two times a day (BID) | ORAL | Status: AC

## 2010-07-26 MED ORDER — EPINEPHRINE 0.3 MG/0.3ML IJ DEVI: 0.3000 mg | Freq: Once | INTRAMUSCULAR | Status: DC

## 2010-07-26 MED ORDER — IBUPROFEN 800 MG PO TABS: 800.0000 mg | ORAL_TABLET | Freq: Three times a day (TID) | ORAL | Status: DC | PRN

## 2010-07-26 NOTE — Patient Instructions (Signed)
Call Dr. Pascal Lux to discuss an appointment for Medications for your mood Make sure you always have your epipen with you Take amoxicillin for your ear Make follow-up appt with me to go over your diabetes and health

## 2010-07-26 NOTE — Assessment & Plan Note (Signed)
Clearly left otitis media with effusion. Amoxicillin x 7 days.

## 2010-07-26 NOTE — Progress Notes (Signed)
  Subjective:    Patient ID: Margaret Larsen, female    DOB: [birth year], 40 y.o.   MRN: [id number]  HPI Here for work in appt for several days of left sided ear pain.  Also notes that she went to ER for peanut anaphylaxis  left ear pain:  Painful, no fever, or drainage.  Feels "full".  No uri symptoms.    Peanut allergy:  Lost her epi pen.  Went to ER early this week for anphylaxis.  She never follow-up up on two allergy referrals.  Bipolar disorder:  She has stopped seeing [facility].  She feels the wait is too long and they don't listen to her.  She continues to take depakote, feels it helps some but continues to to have visual hallucinations, irritability, sadness.  Denies SI, HI but at times considers drinking alcohol heavily as she feels this helps to relieve her symtptoms   Review of Systems     Objective:   Physical Exam GEN: Alert & Oriented, No acute distress.  Poor eye contact HEENT: [place]/AT. EOMI, PERRLA, no conjunctival injection or scleral icterus.  Bilateral tympanic membranes intact with left TM erythema with effusion.   Nares without edema or rhinorrhea.  Oropharynx is without erythema or exudates.  No anterior or posterior cervical lymphadenopathy. CV:  Regular Rate & Rhythm, no murmur Respiratory:  Normal work of breathing, CTAB Psych:  Answers questions appropriately.  Poor eye contact.  Subdued.        Assessment & Plan:

## 2010-07-26 NOTE — Assessment & Plan Note (Addendum)
Patient does not appear to have any gain in functioning or mood but subjectively reports slight improvement on depakote.  She is open to continuing mental health care but not at [facility].  She had previously seen Dr. Pascal Lux for therapy.  She would be a good candidate for [facility].  Gave her number to call for appt.

## 2010-07-26 NOTE — Assessment & Plan Note (Addendum)
Will refill epi pen, urged patient to never be without it.  Discussed that this can be life threatening and it is important to follow-up.  Gave her handouts on peanut allergies to help her better identify how to avoid triggers.  Will reapply to allergy referral through [facility].

## 2010-07-27 ENCOUNTER — Telehealth: Payer: Self-pay | Admitting: Psychology

## 2010-07-27 NOTE — Telephone Encounter (Signed)
Patient called to schedule an appt.  She remembers seeing me in the past.  Initial beh-med appt was one year ago (07/31/2009).  Last contact was 09/2009.  Will see if there is a reason she seeks help this time of year.  I explained that I need a phone call if she is unable to attend her appointment.  She voiced an understanding.

## 2010-08-15 ENCOUNTER — Ambulatory Visit: Payer: Self-pay | Admitting: Family Medicine

## 2010-08-15 ENCOUNTER — Ambulatory Visit: Payer: Self-pay

## 2010-08-15 ENCOUNTER — Emergency Department (HOSPITAL_COMMUNITY)
Admission: EM | Admit: 2010-08-15 | Discharge: 2010-08-15 | Disposition: A | Discharge status: Home / Self Care | Payer: Self-pay | Attending: Emergency Medicine | Admitting: Emergency Medicine

## 2010-08-15 ENCOUNTER — Emergency Department (HOSPITAL_COMMUNITY): Payer: Self-pay

## 2010-08-15 DIAGNOSIS — F3289 Other specified depressive episodes: Secondary | ICD-10-CM | POA: Insufficient documentation

## 2010-08-15 DIAGNOSIS — R10819 Abdominal tenderness, unspecified site: Secondary | ICD-10-CM | POA: Insufficient documentation

## 2010-08-15 DIAGNOSIS — I10 Essential (primary) hypertension: Secondary | ICD-10-CM | POA: Insufficient documentation

## 2010-08-15 DIAGNOSIS — Z79899 Other long term (current) drug therapy: Secondary | ICD-10-CM | POA: Insufficient documentation

## 2010-08-15 DIAGNOSIS — Z794 Long term (current) use of insulin: Secondary | ICD-10-CM | POA: Insufficient documentation

## 2010-08-15 DIAGNOSIS — F329 Major depressive disorder, single episode, unspecified: Secondary | ICD-10-CM | POA: Insufficient documentation

## 2010-08-15 DIAGNOSIS — E7800 Pure hypercholesterolemia, unspecified: Secondary | ICD-10-CM | POA: Insufficient documentation

## 2010-08-15 DIAGNOSIS — R109 Unspecified abdominal pain: Secondary | ICD-10-CM | POA: Insufficient documentation

## 2010-08-15 DIAGNOSIS — E119 Type 2 diabetes mellitus without complications: Secondary | ICD-10-CM | POA: Insufficient documentation

## 2010-08-15 LAB — URINALYSIS, ROUTINE W REFLEX MICROSCOPIC
Bilirubin Urine: NEGATIVE
Hgb urine dipstick: NEGATIVE
Ketones, ur: NEGATIVE mg/dL
Specific Gravity, Urine: 1.017 (ref 1.005–1.030)
Urobilinogen, UA: 0.2 mg/dL (ref 0.0–1.0)

## 2010-08-16 ENCOUNTER — Other Ambulatory Visit: Payer: Self-pay | Admitting: Family Medicine

## 2010-08-16 ENCOUNTER — Ambulatory Visit (INDEPENDENT_AMBULATORY_CARE_PROVIDER_SITE_OTHER): Payer: Self-pay | Admitting: Psychology

## 2010-08-16 DIAGNOSIS — F4310 Post-traumatic stress disorder, unspecified: Secondary | ICD-10-CM

## 2010-08-16 DIAGNOSIS — F319 Bipolar disorder, unspecified: Secondary | ICD-10-CM

## 2010-08-16 MED ORDER — MONTELUKAST SODIUM 10 MG PO TABS: 10.0000 mg | ORAL_TABLET | Freq: Every day | ORAL | Status: DC

## 2010-08-16 MED ORDER — MOMETASONE FUROATE 50 MCG/ACT NA SUSP: 2.0000 | Freq: Every day | NASAL | Status: DC

## 2010-08-16 NOTE — Assessment & Plan Note (Signed)
Did not assess this condition today as focus was on Bipolar Disorder.  I do think this is likely playing a role in the clinical picture.

## 2010-08-16 NOTE — Assessment & Plan Note (Signed)
Report of mood is most recently depressed.  Affect is difficult to read but would say mostly irritable.  She is more talkative than I remember her being and demonstrates signs of grandiosity.  Insight is poor.  I think seeing her in [facility] is a good idea at this point.  Her irritability, especially regarding her 42 yo daughter is concerning.  We discussed ways to prevent herself from doing any harm to her.  No evidence of suicidal ideation.  According to previous notes - she does not have a history of alcohol abuse although she has a strong family history (mother).  She does not see this as a problem currently so will have to use MI techniques.  Her current level of alcohol use will likely impede her chances of getting healthier.    Scheduled her for first available:  September 12th at 11:30.  I explained that I need a phone call if she is unable to attend her appointment and that in order to be seen, she needs to arrive on time.  She voiced an understanding.  She also expressed an interest in returning for therapy.  This appt was scheduled for:  September 6th at 3:00.    See 08/07/2009 beh med note for additional information.
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
## 2010-08-28 ENCOUNTER — Other Ambulatory Visit: Payer: Self-pay | Admitting: Family Medicine

## 2010-08-28 ENCOUNTER — Telehealth: Payer: Self-pay | Admitting: Family Medicine

## 2010-08-28 MED ORDER — QUINAPRIL HCL 10 MG PO TABS: 10.0000 mg | ORAL_TABLET | Freq: Every day | ORAL | Status: DC

## 2010-08-28 NOTE — Telephone Encounter (Signed)
i faxed the refill request to [facility].

## 2010-08-28 NOTE — Telephone Encounter (Signed)
States that the quinapril was sent to wrong pharmacy needs to go to [facility]

## 2010-09-13 ENCOUNTER — Ambulatory Visit: Payer: Self-pay | Admitting: Psychology

## 2010-09-19 ENCOUNTER — Ambulatory Visit (INDEPENDENT_AMBULATORY_CARE_PROVIDER_SITE_OTHER): Payer: Self-pay | Admitting: Psychology

## 2010-09-19 DIAGNOSIS — F319 Bipolar disorder, unspecified: Secondary | ICD-10-CM

## 2010-09-19 NOTE — Progress Notes (Signed)
Margaret Larsen presents for her first [facility] appointment.  She was previously seen in behavioral medicine clinic.  She was diagnosed with Bipolar Disorder by her PCP, Dr. Earnest Bailey and started on Depakote after Seroquel was deemed cost-prohibitive and Carbamazepine was not well tolerated.  No problems with tolerating Depakote.  Mixed messages about whether Margaret Larsen views it as effective.  Last Depakote level was 12/2009 and was 35.2.  She misses 1-2 doses of Depakote per week stating she forgets to take it.  Normal time to take it is with a meal at work.    In terms of symptoms, reports irritable mood nearly daily with sadness underlying it.  Also with sleep problems that make it difficult to function at work.  Other symptoms of Bipolar Disorder documented in earlier notes.  Today, also notes periods of "motivation" where she is energized and has decreased need for sleep.  Reports she has not had alcohol since the last time we met (08/16/2010).  She has socialized as a way to manage her problems.

## 2010-09-19 NOTE — Patient Instructions (Signed)
It was great to see you today.  Your follow up appointment is Oct 17 @ 10:00AM. We discussed your irritability and your drinking today and you decided that for the next 1 month you will steer clear of alcohol.  You also decided that taking your Depakote has seemed to help with your irritability and you decided that taking this medication is important in managing your irritability.  We will get a Depakote level on you today. We will follow up on getting you back for another sleep study to get you a CPAP to help with you obstructive sleep apnea.

## 2010-09-19 NOTE — Assessment & Plan Note (Addendum)
Report of mood is irritable.  Affect is consistent.  It is difficult to elicit history.  Suspect some ambivalence as well as some possible personality issues in addition to mood issues.  We discovered today that she has had a sleep study with follow-up recommended.  Study was 01/04/10.  One recommendation was to return for CPAP titration but has yet to do so.  Will ask PCP to review and follow up as needed.  Recommended treatment included greater adherence to medication (we will check a Depakote level today), continued abstinence from alcohol and follow-up for sleep study to ensure that sleep apnea is not contributing to her mood issues.

## 2010-09-20 ENCOUNTER — Ambulatory Visit: Payer: Self-pay | Admitting: Obstetrics and Gynecology

## 2010-09-20 ENCOUNTER — Telehealth: Payer: Self-pay | Admitting: Psychology

## 2010-09-20 NOTE — Telephone Encounter (Signed)
Discussed lab results with Dr. Kathrynn Running and called Betsie to give her the recommendations.  Will stay the course with her medication.  Dr. Earnest Bailey reviewed our thoughts about sleep apnea.  She had recommended exercise and weight loss for what was classified as mild sleep apnea.  Told Ndidi that if she was interested in pursuing more aggressive treatment, Dr. Earnest Bailey said she would be happy to meet to discuss weight loss efforts as well as commitment to bipap.  Glyn elected to schedule a beh-med appointment with me.  We did so for Monday, September 24th at 4:00.  I explained that I need a phone call if she is unable to attend her appointment.  She voiced an understanding.

## 2010-09-26 LAB — POCT HEMOGLOBIN-HEMACUE
Hemoglobin: 13.8
Operator id: 114531

## 2010-10-02 LAB — BASIC METABOLIC PANEL WITH GFR
BUN: 5 — ABNORMAL LOW
CO2: 29
Calcium: 9.4
Chloride: 104
Creatinine, Ser: 0.59
GFR calc non Af Amer: >60
Glucose, Bld: 126 — ABNORMAL HIGH
Potassium: 3.4 — ABNORMAL LOW
Sodium: 139

## 2010-10-02 LAB — CBC
HCT: 38.4
MCHC: 32.9
MCV: 82.7
Platelets: 433 — ABNORMAL HIGH
RDW: 14.8

## 2010-10-02 LAB — URINE MICROSCOPIC-ADD ON

## 2010-10-02 LAB — URINALYSIS, ROUTINE W REFLEX MICROSCOPIC
Bilirubin Urine: NEGATIVE
Glucose, UA: NEGATIVE
Ketones, ur: NEGATIVE
Leukocytes, UA: NEGATIVE
Nitrite: NEGATIVE
Protein, ur: NEGATIVE
Specific Gravity, Urine: 1.023
Urobilinogen, UA: 1
pH: 6.5

## 2010-10-02 LAB — HEPATIC FUNCTION PANEL
Alkaline Phosphatase: 81
Total Protein: 7

## 2010-10-02 LAB — DIFFERENTIAL
Basophils Absolute: 0
Basophils Relative: 0
Eosinophils Absolute: 0.2
Eosinophils Relative: 2
Lymphs Abs: 3.6

## 2010-10-02 LAB — B-NATRIURETIC PEPTIDE (CONVERTED LAB): Pro B Natriuretic peptide (BNP): <30

## 2010-10-04 LAB — URINALYSIS, ROUTINE W REFLEX MICROSCOPIC
Ketones, ur: NEGATIVE
Nitrite: NEGATIVE
pH: 6.5

## 2010-10-04 LAB — GC/CHLAMYDIA PROBE AMP, GENITAL
Chlamydia, DNA Probe: NEGATIVE
GC Probe Amp, Genital: NEGATIVE

## 2010-10-04 LAB — WET PREP, GENITAL: Yeast Wet Prep HPF POC: NONE SEEN

## 2010-10-08 LAB — URINALYSIS, ROUTINE W REFLEX MICROSCOPIC
Bilirubin Urine: NEGATIVE
Ketones, ur: NEGATIVE
Nitrite: NEGATIVE
Protein, ur: NEGATIVE
Specific Gravity, Urine: 1.027
Urobilinogen, UA: 1

## 2010-10-08 LAB — GLUCOSE, CAPILLARY: Glucose-Capillary: 103 — ABNORMAL HIGH

## 2010-10-08 LAB — DIFFERENTIAL
Basophils Absolute: 0.1
Eosinophils Absolute: 0.2
Lymphocytes Relative: 40
Lymphs Abs: 3.3
Neutrophils Relative %: 51

## 2010-10-08 LAB — CBC
MCHC: 32.7
MCV: 83
Platelets: 361
RDW: 14.6

## 2010-10-08 LAB — COMPREHENSIVE METABOLIC PANEL
ALT: 8
CO2: 28
Calcium: 9
Creatinine, Ser: 0.68
GFR calc non Af Amer: >60
Glucose, Bld: 101 — ABNORMAL HIGH
Total Bilirubin: 0.3

## 2010-10-08 LAB — CK TOTAL AND CKMB (NOT AT ARMC): Total CK: 111

## 2010-10-15 LAB — CBC
HCT: 37.8
Platelets: 505 — ABNORMAL HIGH
RDW: 15.4
WBC: 15 — ABNORMAL HIGH

## 2010-10-15 LAB — URINE CULTURE

## 2010-10-15 LAB — URINALYSIS, ROUTINE W REFLEX MICROSCOPIC
Glucose, UA: NEGATIVE
Protein, ur: 30 — AB
pH: 8

## 2010-10-15 LAB — URINE MICROSCOPIC-ADD ON

## 2010-10-15 LAB — DIFFERENTIAL
Basophils Absolute: 0
Basophils Relative: 0
Eosinophils Relative: 0
Monocytes Absolute: 0.6
Monocytes Relative: 4

## 2010-10-15 LAB — COMPREHENSIVE METABOLIC PANEL
ALT: 14
AST: 23
Albumin: 3.6
Alkaline Phosphatase: 83
Chloride: 100
GFR calc Af Amer: >60
Potassium: 4.2
Sodium: 135
Total Bilirubin: 0.3
Total Protein: 7.9

## 2010-10-18 ENCOUNTER — Encounter: Payer: Self-pay | Admitting: Family Medicine

## 2010-10-18 ENCOUNTER — Ambulatory Visit (INDEPENDENT_AMBULATORY_CARE_PROVIDER_SITE_OTHER): Payer: Self-pay | Admitting: Family Medicine

## 2010-10-18 VITALS — BP 151/105 | HR 90 | Ht 59.0 in | Wt 230.0 lb

## 2010-10-18 DIAGNOSIS — L290 Pruritus ani: Secondary | ICD-10-CM

## 2010-10-18 DIAGNOSIS — E119 Type 2 diabetes mellitus without complications: Secondary | ICD-10-CM

## 2010-10-18 DIAGNOSIS — I10 Essential (primary) hypertension: Secondary | ICD-10-CM

## 2010-10-18 DIAGNOSIS — G4733 Obstructive sleep apnea (adult) (pediatric): Secondary | ICD-10-CM

## 2010-10-18 LAB — POCT GLYCOSYLATED HEMOGLOBIN (HGB A1C): Hemoglobin A1C: 7.4

## 2010-10-18 NOTE — Assessment & Plan Note (Addendum)
Mild OSA per last sleep study.  Needs to return for titration.  Will place an order for this today.

## 2010-10-18 NOTE — Assessment & Plan Note (Signed)
Patient has elevated blood pressure today. She is not currently taking any of her medications. She does not report any side effects or financial difficulty. I have discussed with her the importance of good blood pressure control. She is agreeable to restarting  these medications.

## 2010-10-18 NOTE — Assessment & Plan Note (Signed)
[name] presentation is classic for pruritus E9. She has just recently been treated for hemorrhoids and reports copious cleaning of the area resulting in further itching. Advised to wash with warm water and pat gently dry. Avoid the use of soaps. Advised her to use hydrocortisone 1% cream over-the-counter for itching and may use a very clean with zinc such as Desitin. Advised if it does not improve to return and I will examine this area again.

## 2010-10-18 NOTE — Patient Instructions (Signed)
I will schedule you for a sleep study to get your cpap mask fitted  Bring your sugar log back to me at your next visit  Pruritis Ani:  Use 1% hydrocortisone cream (buy OTC) to help with your itching and desitin can help protect the skin    Anal Pruritis Anal pruritis is an itching of the anus, which is often due to increased moisture of the skin around the anus. Moisture may be due to sweating or a small amount of remaining stool. The itching and scratching can cause further skin damage.   CAUSES  Perfumed soaps and sprays.   Dietary factors such as caffeine, beer, milk products, chocolate, nuts, citrus fruits, tomatoes, spicy seasonings, jalapeno peppers, and salsa.   Hemorrhoids, infections, and other anal diseases.  HOME CARE INSTRUCTIONS  Practice good hygiene.   Clean the anal area with wet toilet paper, baby wipes, or a wet washcloth after every bowel movement and at bedtime. Avoid using soaps on the anal area. Do not scrub the anal area with anything, even toilet paper.   Try not to scratch the itchy area. Scratching produces more damage, which makes the itching worse.   Take sitz baths in warm water for 15 to 20 minutes, 2 to 3 times a day. Blot the skin dry around the anus very well with a soft cloth after each bath.   Zinc oxide ointment or a moisture barrier cream can be applied several times daily to protect the skin.   Medicines to treat infections and reduce itching should be used as prescribed.   Fiber supplements are helpful in normalizing the stool if you have frequent loose stools.   Wear cotton underwear. You may place a small strip of cotton material to the anal area to help absorb the extra drainage and moisture. This should be changed several times a day.  SEEK MEDICAL CARE IF:  Problems are not improved in several days or are getting worse.   Other members of your family develop a similar problem.   You have an oral temperature above 101.   There are  problems with increased pain, swelling, or redness.  MAKE SURE YOU:    Understand these instructions.   Will watch your condition.   Will get help right away if you are not doing well or get worse.  Document Released: 02/01/2004 Document Re-Released: 06/13/2009 [facility] Patient Information 2011 [place], [place].

## 2010-10-18 NOTE — Progress Notes (Signed)
  Subjective:    Patient ID: Margaret Larsen, female    DOB: Oct 02, 1970, 40 y.o.   MRN: [id number]  HPI  DIABETES  Taking and tolerating: yes- Lantus 55 units Qam., she cannot tell me when she uses the mealtime novolog. Fasting blood sugars:108-123 Hypoglycemic symptoms: no Visual problems: no Monitoring feet: yes Numbness/Tingling: no Last eye exam: this year, has a follow-up glaucoma check this month Diabetic Labs:  Lab Results  Component Value Date   HGBA1C 8.3 07/10/2010   HGBA1C 8.0 02/13/2010   HGBA1C 7.9 11/20/2009   Lab Results  Component Value Date   LDLCALC 109* 03/15/2010   CREATININE 0.65 03/15/2010   Last microalbumin: No results found for this basename: MICROALBUR, [id number]   Sleep study: The patient reports that she would be willing to comply with CPAP at this time as she states she is having significant daytime sleepiness. Her last sleep study indicated mild obstructive sleep apnea. Patient reports that she does not fall asleep while at work or while driving although at times she may falsely while talking to people.  Working full time, getting along better.  HYPERLIPIDEMIA  Diet: Not following low cholesterol diet Exercise: No regular exercise Wt Readings from Last 3 Encounters:  10/18/10 230 lb (104.327 kg)  07/26/10 229 lb 4.8 oz (104.01 kg)  07/10/10 226 lb (102.513 kg)   ROS:  Denies RUQ pain, myalgias, or symptoms or coronary ischemia Lab Results  Component Value Date   LDLCALC 109* 03/15/2010   Lab Results  Component Value Date   CHOL 172 03/15/2010   CHOL 172 03/15/2010   CHOL 177 07/29/2008   Lab Results  Component Value Date   HDL 42 03/15/2010   HDL 42 03/15/2010   HDL 42 07/29/2008   Lab Results  Component Value Date   TRIG 105 03/15/2010   TRIG 105 03/15/2010   TRIG 105 07/29/2008   Lab Results  Component Value Date   ALT 12 03/15/2010   AST 14 03/15/2010   ALKPHOS 69 03/15/2010   BILITOT 0.2* 03/15/2010       Review of Systemssee above       Objective:   Physical Exam GEN: Alert & Oriented, No acute distress CV:  Regular Rate & Rhythm, no murmur Respiratory:  Normal work of breathing, CTAB Abd:  + BS, soft, no tenderness to palpation Ext: no pre-tibial edema        Assessment & Plan:

## 2010-10-18 NOTE — Assessment & Plan Note (Signed)
A1c is improved at 7.4%. We'll continue Lantus and NovoLog meal coverage. It is unclear if patient's consistently checking blood sugars or taking mealtime NovoLog. Advised her to keep a glucose log and bring to next appointment in one month.

## 2010-10-19 ENCOUNTER — Other Ambulatory Visit: Payer: Self-pay | Admitting: Family Medicine

## 2010-10-19 DIAGNOSIS — E785 Hyperlipidemia, unspecified: Secondary | ICD-10-CM

## 2010-10-19 MED ORDER — QUINAPRIL HCL 10 MG PO TABS: 10.0000 mg | ORAL_TABLET | Freq: Every day | ORAL | Status: DC

## 2010-10-19 MED ORDER — ATORVASTATIN CALCIUM 20 MG PO TABS: 20.0000 mg | ORAL_TABLET | Freq: Every day | ORAL | Status: DC

## 2010-10-19 NOTE — Telephone Encounter (Signed)
Pt can't find her Lipitor and Accupril since she has moved and needs more called into [facility]

## 2010-10-24 ENCOUNTER — Ambulatory Visit: Payer: Self-pay | Admitting: Psychology

## 2010-11-07 ENCOUNTER — Encounter (HOSPITAL_BASED_OUTPATIENT_CLINIC_OR_DEPARTMENT_OTHER): Payer: Self-pay

## 2010-11-16 ENCOUNTER — Encounter (HOSPITAL_BASED_OUTPATIENT_CLINIC_OR_DEPARTMENT_OTHER): Payer: Self-pay

## 2010-11-22 ENCOUNTER — Ambulatory Visit: Payer: Self-pay | Admitting: Family Medicine

## 2010-12-07 ENCOUNTER — Ambulatory Visit (HOSPITAL_BASED_OUTPATIENT_CLINIC_OR_DEPARTMENT_OTHER): Payer: Self-pay | Attending: Family Medicine | Admitting: Radiology

## 2010-12-07 VITALS — Ht 59.0 in | Wt 215.0 lb

## 2010-12-07 DIAGNOSIS — Z79899 Other long term (current) drug therapy: Secondary | ICD-10-CM | POA: Insufficient documentation

## 2010-12-07 DIAGNOSIS — G4733 Obstructive sleep apnea (adult) (pediatric): Secondary | ICD-10-CM

## 2010-12-07 DIAGNOSIS — G4710 Hypersomnia, unspecified: Secondary | ICD-10-CM | POA: Insufficient documentation

## 2010-12-15 DIAGNOSIS — Z79899 Other long term (current) drug therapy: Secondary | ICD-10-CM

## 2010-12-15 DIAGNOSIS — G4710 Hypersomnia, unspecified: Secondary | ICD-10-CM

## 2010-12-15 DIAGNOSIS — G4730 Sleep apnea, unspecified: Secondary | ICD-10-CM

## 2010-12-15 NOTE — Procedures (Signed)
NAME:  Margaret Larsen, Margaret Larsen            ACCOUNT NO.:  [id number]  MEDICAL RECORD NO.:  [id number]          PATIENT TYPE:  OUT  LOCATION:  SLEEP CENTER                 FACILITY:  [facility]  PHYSICIAN:  Kaziyah Parkison D. Maple Hudson, MD, FCCP, FACPDATE OF BIRTH:  10/09/1970  DATE OF STUDY:  12/07/2010                           NOCTURNAL POLYSOMNOGRAM  REFERRING PHYSICIAN:  Delbert Harness, MD  REFERRING PHYSICIAN:  Delbert Harness, MD  INDICATION FOR STUDY:  Hypersomnia with sleep apnea.  EPWORTH SLEEPINESS SCORE:  21/24.  BMI 43.4, weight 215 pounds, height 59 inches, neck 17 inch.  MEDICATIONS:  Home medications are charted and reviewed.  A baseline diagnostic NPSG on January 04, 2010 recorded an AHI of 13.8 per hour.  CPAP titration is requested.  SLEEP ARCHITECTURE:  Total sleep time 437 minutes with sleep efficiency 97.7%.  Stage I was 2.5%, stage II 65.1%, stage III 1.9%, REM 30.4% of total sleep time.  Sleep latency 4.5 minutes, REM latency 106 minutes, awake after sleep onset 6 minutes, arousal index 7.4.  Bedtime medication:  Depakote ER.  RESPIRATORY DATA:  CPAP titration protocol.  CPAP was titrated to 12 CWP, AHI 1.1 per hour.  She wore a medium wide ResMed Mirage FX nasal mask with heated humidifier.  OXYGEN DATA:  Snoring was prevented by CPAP and mean oxygen saturation held at 98.8% on room air.  CARDIAC DATA:  Normal sinus rhythm.  MOVEMENT-PARASOMNIA:  No significant movement disturbance.  No bathroom trips.  IMPRESSIONS-RECOMMENDATIONS: 1. Successful continuous positive airway pressure titration to 12 cm     of water pressure, apnea/hypopnea index 1.1 per hour.  Snoring was     prevented and mean oxygen saturation held 92.8% on room air.  The     percentage of time spent in REM was higher than average suggesting     rebound from previous REM suppression, once sustained sleep was     permitted by CPAP.  She wore a medium wide ResMed Mirage FX nasal     mask with heated  humidifier. 2. Baseline diagnostic nocturnal polysomnogram on January 04, 2010,     had recorded apnea/hypopnea index 13.8 per hour.     [name], MD, [facility], FACP Diplomate, Biomedical engineer of Sleep Medicine Electronically Signed    CDY/MEDQ  D:  12/15/2010 09:47:21  T:  12/15/2010 10:38:32  Job:  [id number]

## 2010-12-21 ENCOUNTER — Telehealth: Payer: Self-pay | Admitting: Family Medicine

## 2010-12-21 DIAGNOSIS — G4733 Obstructive sleep apnea (adult) (pediatric): Secondary | ICD-10-CM

## 2010-12-21 NOTE — Telephone Encounter (Signed)
Discussed cpap titration with patient.  She is willing to wear.  Will attempt to find source for DME for this patient.

## 2011-01-18 ENCOUNTER — Emergency Department (HOSPITAL_COMMUNITY)
Admission: EM | Admit: 2011-01-18 | Discharge: 2011-01-19 | Disposition: A | Discharge status: Home / Self Care | Payer: Self-pay | Attending: Emergency Medicine | Admitting: Emergency Medicine

## 2011-01-18 ENCOUNTER — Encounter (HOSPITAL_COMMUNITY): Payer: Self-pay | Admitting: *Deleted

## 2011-01-18 DIAGNOSIS — R112 Nausea with vomiting, unspecified: Secondary | ICD-10-CM | POA: Insufficient documentation

## 2011-01-18 DIAGNOSIS — R1031 Right lower quadrant pain: Secondary | ICD-10-CM | POA: Insufficient documentation

## 2011-01-18 DIAGNOSIS — E119 Type 2 diabetes mellitus without complications: Secondary | ICD-10-CM | POA: Insufficient documentation

## 2011-01-18 DIAGNOSIS — Z794 Long term (current) use of insulin: Secondary | ICD-10-CM | POA: Insufficient documentation

## 2011-01-18 DIAGNOSIS — N200 Calculus of kidney: Secondary | ICD-10-CM | POA: Insufficient documentation

## 2011-01-18 DIAGNOSIS — R109 Unspecified abdominal pain: Secondary | ICD-10-CM | POA: Insufficient documentation

## 2011-01-18 HISTORY — DX: Disorder of kidney and ureter, unspecified: N28.9

## 2011-01-18 MED ORDER — LIDOCAINE HCL (PF) 1 % IJ SOLN: 5.0000 mL | Freq: Once | INTRAMUSCULAR | Status: DC

## 2011-01-18 NOTE — ED Notes (Signed)
Rt lower abd pain  With  nand v. Since last pm

## 2011-01-18 NOTE — ED Notes (Signed)
Pt c/o RLQ and right flank pain onset earlier tonight.  Pt denies any pain with urination.  St's has had nausea and vomiting.  Pt has hx of kidney stones

## 2011-01-19 ENCOUNTER — Emergency Department (HOSPITAL_COMMUNITY): Payer: Self-pay

## 2011-01-19 LAB — URINE CULTURE: Colony Count: 35000

## 2011-01-19 LAB — URINE MICROSCOPIC-ADD ON

## 2011-01-19 LAB — URINALYSIS, ROUTINE W REFLEX MICROSCOPIC
Bilirubin Urine: NEGATIVE
Ketones, ur: 15 mg/dL — AB
Nitrite: NEGATIVE
Specific Gravity, Urine: 1.025 (ref 1.005–1.030)
Urobilinogen, UA: 1 mg/dL (ref 0.0–1.0)
pH: 6.5 (ref 5.0–8.0)

## 2011-01-19 MED ORDER — TAMSULOSIN HCL 0.4 MG PO CAPS: 0.4000 mg | ORAL_CAPSULE | Freq: Two times a day (BID) | ORAL | Status: DC

## 2011-01-19 MED ORDER — ONDANSETRON HCL 4 MG/2ML IJ SOLN
4.0000 mg | Freq: Once | INTRAMUSCULAR | Status: AC
  Administered 2011-01-19: 4 mg via INTRAVENOUS
  Filled 2011-01-19: qty 2

## 2011-01-19 MED ORDER — NAPROXEN 500 MG PO TABS: 500.0000 mg | ORAL_TABLET | Freq: Two times a day (BID) | ORAL | Status: DC

## 2011-01-19 MED ORDER — KETOROLAC TROMETHAMINE 30 MG/ML IJ SOLN
30.0000 mg | Freq: Once | INTRAMUSCULAR | Status: AC
  Administered 2011-01-19: 30 mg via INTRAVENOUS
  Filled 2011-01-19: qty 1

## 2011-01-19 MED ORDER — PROMETHAZINE HCL 25 MG PO TABS: 25.0000 mg | ORAL_TABLET | Freq: Four times a day (QID) | ORAL | Status: AC | PRN

## 2011-01-19 MED ORDER — HYDROMORPHONE HCL PF 1 MG/ML IJ SOLN
1.0000 mg | Freq: Once | INTRAMUSCULAR | Status: AC
  Administered 2011-01-19: 1 mg via INTRAVENOUS
  Filled 2011-01-19: qty 1

## 2011-01-19 MED ORDER — HYDROCODONE-ACETAMINOPHEN 5-500 MG PO TABS: 1.0000 | ORAL_TABLET | Freq: Four times a day (QID) | ORAL | Status: AC | PRN

## 2011-01-19 NOTE — ED Notes (Signed)
Patient transported to CT 

## 2011-01-19 NOTE — ED Notes (Addendum)
PT with discharge instructions.  However, hr down to 55 and O2 sats dipping into 80's after dilaudid.  Pt placed on 2L O2 and a monitor and will discharge when vitals improve.  Dr Hyacinth Meeker aware.

## 2011-01-19 NOTE — ED Provider Notes (Signed)
History     CSN: [id number]  Arrival date & time 01/18/11  2258   First MD Initiated Contact with Patient 01/18/11 2350      Chief Complaint  Patient presents with  . Abdominal Pain    (Consider location/radiation/quality/duration/timing/severity/associated sxs/prior treatment) HPI Comments: 41 year old female with a history of kidney stones presents with pain in the right lower quadrant which radiates to the right flank. She describes this pain as a dull. There is no comfortable position but feels that walking relieves some of the pain. This has been intermittent, associated with nausea and vomiting but no fevers chills dysuria or diarrhea. She also denies hematuria. Symptoms are severe at worst by wax and wane in intensity. She admits to having cesarean section, cholecystitis status post removal in the past but has no other abdominal surgeries  Patient is a 41 y.o. female presenting with abdominal pain. The history is provided by the patient and a relative.  Abdominal Pain The primary symptoms of the illness include abdominal pain.    Past Medical History  Diagnosis Date  . Diabetes mellitus   . Renal disorder     Past Surgical History  Procedure Date  . Cholecystectomy     History reviewed. No pertinent family history.  History  Substance Use Topics  . Smoking status: Never Smoker   . Smokeless tobacco: Never Used  . Alcohol Use: No    OB History    Grav Para Term Preterm Abortions TAB SAB Ect Mult Living                  Review of Systems  Gastrointestinal: Positive for abdominal pain.  All other systems reviewed and are negative.    Allergies  Allegra-d; Codeine; Diphenhydramine hcl; and Peanut-containing drug products  Home Medications   Current Outpatient Rx  Name Route Sig Dispense Refill  . ASPIRIN 81 MG PO TBEC Oral Take 81 mg by mouth daily.      . ATORVASTATIN CALCIUM 20 MG PO TABS Oral Take 20 mg by mouth daily.    Marland Kitchen DIVALPROEX SODIUM ER 500  MG PO TB24 Oral Take 1,000 mg by mouth daily.      Marland Kitchen EPINEPHRINE 0.3 MG/0.3ML IJ DEVI Intramuscular Inject 0.3 mLs (0.3 mg total) into the muscle once. if having allergic reaction with trouble breathing, usep en and go straight to ER 1 Device 1  . HYDROCORTISONE ACETATE 25 MG RE SUPP Rectal Place 25 mg rectally every 12 (twelve) hours as needed.    . IBUPROFEN 800 MG PO TABS Oral Take 800 mg by mouth every 8 (eight) hours as needed. As needed for pain    . INSULIN ASPART 100 UNIT/ML [place] SOLN Subcutaneous Inject 4 Units into the skin 3 (three) times daily before meals.    . INSULIN GLARGINE 100 UNIT/ML [place] SOLN Subcutaneous Inject 55 Units into the skin daily.    . MOMETASONE FUROATE 50 MCG/ACT NA SUSP Nasal Place 2 sprays into the nose daily.    Marland Kitchen MONTELUKAST SODIUM 10 MG PO TABS Oral Take 10 mg by mouth at bedtime.    . QUINAPRIL HCL 10 MG PO TABS Oral Take 10 mg by mouth daily.    Marland Kitchen HYDROCODONE-ACETAMINOPHEN 5-500 MG PO TABS Oral Take 1-2 tablets by mouth every 6 (six) hours as needed for pain. 15 tablet 0  . NAPROXEN 500 MG PO TABS Oral Take 1 tablet (500 mg total) by mouth 2 (two) times daily with a meal. 30 tablet 0  .  PROMETHAZINE HCL 25 MG PO TABS Oral Take 1 tablet (25 mg total) by mouth every 6 (six) hours as needed for nausea. 12 tablet 0  . TAMSULOSIN HCL 0.4 MG PO CAPS Oral Take 1 capsule (0.4 mg total) by mouth 2 (two) times daily. 10 capsule 0    BP 138/82  Pulse 75  Temp(Src) 98.1 F (36.7 C) (Oral)  Resp 20  Ht 4\' 11"  (1.499 m)  Wt 225 lb (102.059 kg)  BMI 45.44 kg/m2  SpO2 95%  Physical Exam  Nursing note and vitals reviewed. Constitutional: She appears well-developed and well-nourished.       Uncomfortable appearing  HENT:  Head: Normocephalic and atraumatic.  Mouth/Throat: Oropharynx is clear and moist. No oropharyngeal exudate.  Eyes: Conjunctivae and EOM are normal. Pupils are equal, round, and reactive to light. Right eye exhibits no discharge. Left eye exhibits no  discharge. No scleral icterus.  Cardiovascular: Normal rate, regular rhythm, normal heart sounds and intact distal pulses.  Exam reveals no gallop and no friction rub.   No murmur heard. Pulmonary/Chest: Effort normal and breath sounds normal. No respiratory distress. She has no wheezes. She has no rales.  Abdominal: Soft. Bowel sounds are normal. She exhibits no distension and no mass. There is no tenderness.       No reproducible tenderness with palpation to the right lower quadrant, mild CVA tenderness to percussion over the right flank  Musculoskeletal: Normal range of motion. She exhibits no edema and no tenderness.  Neurological: She is alert. Coordination normal.       Gait normal, speech normal, patient pacing the room  Skin: Skin is warm and dry. No rash noted. No erythema.  Psychiatric: She has a normal mood and affect. Her behavior is normal.    ED Course  Procedures (including critical care time)  Labs Reviewed  URINALYSIS, ROUTINE W REFLEX MICROSCOPIC - Abnormal; Notable for the following:    APPearance TURBID (*)    Hgb urine dipstick LARGE (*)    Ketones, ur 15 (*)    Protein, ur 30 (*)    Leukocytes, UA SMALL (*)    All other components within normal limits  URINE MICROSCOPIC-ADD ON - Abnormal; Notable for the following:    Squamous Epithelial / LPF MANY (*)    Bacteria, UA MANY (*)    All other components within normal limits  POCT PREGNANCY, URINE  POCT PREGNANCY, URINE  URINE CULTURE   Ct Abdomen Pelvis Wo Contrast  01/19/2011  *RADIOLOGY REPORT*  Clinical Data:  Right flank pain, nausea, vomiting, past history of diabetes, cholecystectomy, cesarean section  CT ABDOMEN AND PELVIS WITHOUT CONTRAST  Technique:  Multidetector CT imaging of the abdomen and pelvis was performed following the standard protocol without intravenous contrast. Sagittal and coronal MPR images reconstructed from axial data set.  Comparison: 08/15/2010  Findings: Lung bases clear. Mild right  hydronephrosis and hydroureter secondary to a 4 mm diameter distal right ureteral calculus image 56. No additional urinary tract calcification. Within limits of a nonenhanced exam, no focal abnormalities of the liver, spleen, pancreas, left kidney, or adrenal glands. Gallbladder surgically absent.  Decompressed bladder, uterus and adnexae normal appearance. Normal appendix. Tiny periumbilical nodules question normal sized lymph nodes, unchanged. Stomach and bowel loops grossly normal appearance. No mass, adenopathy, free fluid, or inflammatory process. Scattered pelvic phleboliths.  IMPRESSION: Mild right hydronephrosis and hydroureter secondary to a 4 mm diameter distal right ureteral calculus.  Original Report Authenticated By: Lollie Marrow, M.D.  1. Kidney stone on right side       MDM  Patient has signs and symptoms and exam consistent with possible kidney stone. Again there is no reproducible tenderness on the right lower quadrant exam. We'll examine labs, pain control, CT scan without contrast. Intravenous pain medication including ketorolac, Zofran  Patient had minimal relief with intravenous Toradol, have now given 1 mg of hydromorphone IV with better relief. Both patient and her family members state that they want to go home and only followup as needed. I have explained to her the size of the kidney stone and the likelihood of her being able to pass this spontaneously. I have given her indications for followup, she has expressed her understanding.  Laboratory evaluation showed urinalysis with hematuria and many bacteria as well as many squamous epithelial cells. I have ordered a urine culture, will treat only if positive. Pregnancy test is negative, and CT scan as above. I have personally visualized the CT scan and agree with radiologist findings a 4 mm kidney stone in the distal ureter   Discharge prescriptions  #1 Flomax #2 vicodin #3 Phenergan #4 Naprosyn       Vida Roller, MD 01/19/11 [phone]

## 2011-01-19 NOTE — ED Notes (Signed)
PT back from CT and states pain beginning to improve.

## 2011-01-19 NOTE — ED Notes (Signed)
Lab called to add on uc.

## 2011-01-19 NOTE — ED Notes (Signed)
Pt ambulated.  Dr Hyacinth Meeker satisfied with pt vs.  Pt has hx of sleep apnea.

## 2011-01-19 NOTE — ED Notes (Signed)
Ambulated pt 92% room air

## 2011-01-19 NOTE — ED Notes (Signed)
Pt says pain improved to 7/10, but she is crying.  Dr Hyacinth Meeker to be notified.  Denies nausea at this time.

## 2011-01-20 ENCOUNTER — Emergency Department (HOSPITAL_COMMUNITY): Payer: Self-pay

## 2011-01-20 ENCOUNTER — Telehealth: Payer: Self-pay | Admitting: Family Medicine

## 2011-01-20 ENCOUNTER — Emergency Department (HOSPITAL_COMMUNITY)
Admission: EM | Admit: 2011-01-20 | Discharge: 2011-01-20 | Disposition: A | Discharge status: Home / Self Care | Payer: Self-pay | Attending: Emergency Medicine | Admitting: Emergency Medicine

## 2011-01-20 ENCOUNTER — Encounter (HOSPITAL_COMMUNITY): Payer: Self-pay | Admitting: Adult Health

## 2011-01-20 DIAGNOSIS — R109 Unspecified abdominal pain: Secondary | ICD-10-CM | POA: Insufficient documentation

## 2011-01-20 DIAGNOSIS — N201 Calculus of ureter: Secondary | ICD-10-CM | POA: Insufficient documentation

## 2011-01-20 DIAGNOSIS — Z794 Long term (current) use of insulin: Secondary | ICD-10-CM | POA: Insufficient documentation

## 2011-01-20 DIAGNOSIS — N23 Unspecified renal colic: Secondary | ICD-10-CM

## 2011-01-20 DIAGNOSIS — E119 Type 2 diabetes mellitus without complications: Secondary | ICD-10-CM | POA: Insufficient documentation

## 2011-01-20 HISTORY — DX: Calculus of kidney: N20.0

## 2011-01-20 LAB — URINE MICROSCOPIC-ADD ON

## 2011-01-20 LAB — URINE CULTURE: Colony Count: 45000

## 2011-01-20 LAB — POCT I-STAT, CHEM 8
Creatinine, Ser: 0.9 mg/dL (ref 0.50–1.10)
HCT: 38 % (ref 36.0–46.0)
Hemoglobin: 12.9 g/dL (ref 12.0–15.0)
Potassium: 3.8 mEq/L (ref 3.5–5.1)
Sodium: 142 mEq/L (ref 135–145)

## 2011-01-20 LAB — URINALYSIS, ROUTINE W REFLEX MICROSCOPIC
Specific Gravity, Urine: 1.015 (ref 1.005–1.030)
Urobilinogen, UA: 1 mg/dL (ref 0.0–1.0)

## 2011-01-20 LAB — POCT PREGNANCY, URINE: Preg Test, Ur: NEGATIVE

## 2011-01-20 MED ORDER — ONDANSETRON 8 MG PO TBDP
8.0000 mg | ORAL_TABLET | Freq: Once | ORAL | Status: AC
  Administered 2011-01-20: 8 mg via ORAL
  Filled 2011-01-20: qty 1

## 2011-01-20 MED ORDER — IBUPROFEN 200 MG PO TABS
400.0000 mg | ORAL_TABLET | Freq: Once | ORAL | Status: AC
  Administered 2011-01-20: 400 mg via ORAL
  Filled 2011-01-20: qty 1

## 2011-01-20 MED ORDER — OXYCODONE-ACETAMINOPHEN 5-325 MG PO TABS
2.0000 | ORAL_TABLET | Freq: Once | ORAL | Status: AC
  Administered 2011-01-20: 2 via ORAL
  Filled 2011-01-20: qty 2

## 2011-01-20 NOTE — Telephone Encounter (Signed)
Went to ED on Friday for flank pain and was diagnosed with kidney stones. Was having nausea and vomiting on Friday. Now having diarrhea and chills; has not vomited since Friday.  Feels like she is warm, but has not taken temperature.  Was given pain medication on Friday but no antibiotics or other medications. Is having pain on her right side to her back. Has not been able to eat much but is keeping fluids down.  No blood in her urine or stool. No dizziness or syncope.  Suggested that patient can either go to Urgent Care or ER if she feels she needs immediate evaluation or can call and be seen in the clinic tomorrow morning for SDA.

## 2011-01-20 NOTE — ED Notes (Signed)
Reports flank pain that began Friday associated with fevers and chills. Denies hematuria, c/o dysuria

## 2011-01-20 NOTE — ED Provider Notes (Signed)
History     CSN: [id number]  Arrival date & time 01/20/11  1423      Chief Complaint  Patient presents with  . Flank Pain    HPI Pt was seen at 1755.  Per pt and spouse, c/o gradual onset and persistence of waxing and waning right sided flank pain since yesterday.  Has been assoc with nausea, chills, and home temps to "99."  Pt was eval in ED yesterday for same, dx kidney stone.  Has been taking her meds as rx with relief of her symptoms. States she is here today because she "hasn't passed it yet."  Denies hematuria, no diarrhea, no CP/SOB.      Past Medical History  Diagnosis Date  . Diabetes mellitus   . Renal disorder   . Kidney stones     Past Surgical History  Procedure Date  . Cholecystectomy      History  Substance Use Topics  . Smoking status: Never Smoker   . Smokeless tobacco: Never Used  . Alcohol Use: No    Review of Systems ROS: Statement: All systems negative except as marked or noted in the HPI; Constitutional: Negative for fever and +chills. ; ; Eyes: Negative for eye pain, redness and discharge. ; ; ENMT: Negative for ear pain, hoarseness, nasal congestion, sinus pressure and sore throat. ; ; Cardiovascular: Negative for chest pain, palpitations, diaphoresis, dyspnea and peripheral edema. ; ; Respiratory: Negative for cough, wheezing and stridor. ; ; Gastrointestinal: +N/V.  Negative for vomiting, diarrhea, abdominal pain, blood in stool, hematemesis, jaundice and rectal bleeding. . ; ; Genitourinary: +dysuria, flank pain and negative for hematuria. ; ; Musculoskeletal: Negative for back pain and neck pain. Negative for swelling and trauma.; ; Skin: Negative for pruritus, rash, abrasions, blisters, bruising and skin lesion.; ; Neuro: Negative for headache, lightheadedness and neck stiffness. Negative for weakness, altered level of consciousness , altered mental status, extremity weakness, paresthesias, involuntary movement, seizure and syncope.     Allergies    Allegra-d; Codeine; Diphenhydramine hcl; and Peanut-containing drug products  Home Medications   Current Outpatient Rx  Name Route Sig Dispense Refill  . ASPIRIN 81 MG PO TBEC Oral Take 81 mg by mouth daily.      . ATORVASTATIN CALCIUM 20 MG PO TABS Oral Take 20 mg by mouth daily.    Marland Kitchen DIVALPROEX SODIUM ER 500 MG PO TB24 Oral Take 1,000 mg by mouth daily.      Marland Kitchen EPINEPHRINE 0.3 MG/0.3ML IJ DEVI Intramuscular Inject 0.3 mLs (0.3 mg total) into the muscle once. if having allergic reaction with trouble breathing, usep en and go straight to ER 1 Device 1  . HYDROCODONE-ACETAMINOPHEN 5-500 MG PO TABS Oral Take 1-2 tablets by mouth every 6 (six) hours as needed for pain. 15 tablet 0  . HYDROCORTISONE ACETATE 25 MG RE SUPP Rectal Place 25 mg rectally every 12 (twelve) hours as needed.    . IBUPROFEN 800 MG PO TABS Oral Take 800 mg by mouth every 8 (eight) hours as needed. As needed for pain    . INSULIN ASPART 100 UNIT/ML [place] SOLN Subcutaneous Inject 4 Units into the skin 3 (three) times daily before meals.    . INSULIN GLARGINE 100 UNIT/ML [place] SOLN Subcutaneous Inject 55 Units into the skin daily.    . MOMETASONE FUROATE 50 MCG/ACT NA SUSP Nasal Place 2 sprays into the nose daily.    Marland Kitchen MONTELUKAST SODIUM 10 MG PO TABS Oral Take 10 mg by mouth  at bedtime.    Marland Kitchen NAPROXEN 500 MG PO TABS Oral Take 1 tablet (500 mg total) by mouth 2 (two) times daily with a meal. 30 tablet 0  . PROMETHAZINE HCL 25 MG PO TABS Oral Take 1 tablet (25 mg total) by mouth every 6 (six) hours as needed for nausea. 12 tablet 0  . QUINAPRIL HCL 10 MG PO TABS Oral Take 10 mg by mouth daily.    Marland Kitchen TAMSULOSIN HCL 0.4 MG PO CAPS Oral Take 1 capsule (0.4 mg total) by mouth 2 (two) times daily. 10 capsule 0    BP 109/77  Pulse 86  Temp(Src) 98.5 F (36.9 C) (Oral)  Resp 20  SpO2 95%  Physical Exam 1800: Physical examination:  Nursing notes reviewed; Vital signs and O2 SAT reviewed;  Constitutional: Well developed, Well  nourished, Well hydrated, In no acute distress; Head:  Normocephalic, atraumatic; Eyes: EOMI, PERRL, No scleral icterus; ENMT: Mouth and pharynx normal, Mucous membranes moist; Neck: Supple, Full range of motion, No lymphadenopathy; Cardiovascular: Regular rate and rhythm, No murmur, rub, or gallop; Respiratory: Breath sounds clear & equal bilaterally, No rales, rhonchi, wheezes, Normal respiratory effort/excursion; Chest: Nontender, Movement normal; Abdomen: Soft, Nontender, Nondistended, Normal bowel sounds; Genitourinary: No CVA tenderness; Extremities: Pulses normal, No tenderness, No edema, No calf edema or asymmetry.; Neuro: AA&Ox3, Major CN grossly intact.  No gross focal motor or sensory deficits in extremities.; Skin: Color normal, Warm, Dry, no rash.    ED Course  Procedures     MDM  MDM Reviewed: nursing note, previous chart and vitals Reviewed previous: CT scan Interpretation: labs and x-ray   Results for orders placed during the hospital encounter of 01/20/11  URINALYSIS, ROUTINE W REFLEX MICROSCOPIC      Component Value Range   Color, Urine YELLOW  YELLOW    APPearance CLOUDY (*) CLEAR    Specific Gravity, Urine 1.015  1.005 - 1.030    pH 7.5  5.0 - 8.0    Glucose, UA NEGATIVE  NEGATIVE (mg/dL)   Hgb urine dipstick MODERATE (*) NEGATIVE    Bilirubin Urine NEGATIVE  NEGATIVE    Ketones, ur TRACE (*) NEGATIVE (mg/dL)   Protein, ur 30 (*) NEGATIVE (mg/dL)   Urobilinogen, UA 1.0  0.0 - 1.0 (mg/dL)   Nitrite NEGATIVE  NEGATIVE    Leukocytes, UA NEGATIVE  NEGATIVE   POCT PREGNANCY, URINE      Component Value Range   Preg Test, Ur NEGATIVE    URINE MICROSCOPIC-ADD ON      Component Value Range   Squamous Epithelial / LPF MANY (*) RARE    WBC, UA 0-2  <3 (WBC/hpf)   RBC / HPF 11-20  <3 (RBC/hpf)   Bacteria, UA RARE  RARE   POCT I-STAT, CHEM 8      Component Value Range   Sodium 142  135 - 145 (mEq/L)   Potassium 3.8  3.5 - 5.1 (mEq/L)   Chloride 101  96 - 112 (mEq/L)    BUN 5 (*) 6 - 23 (mg/dL)   Creatinine, Ser 1.61  0.50 - 1.10 (mg/dL)   Glucose, Bld 096 (*) 70 - 99 (mg/dL)   Calcium, Ion 0.45  4.09 - 1.32 (mmol/L)   TCO2 31  0 - 100 (mmol/L)   Hemoglobin 12.9  12.0 - 15.0 (g/dL)   HCT 81.1  91.4 - 78.2 (%)   Ct Abdomen Pelvis Wo Contrast 01/19/2011  *RADIOLOGY REPORT*  Clinical Data:  Right flank pain, nausea, vomiting, past  history of diabetes, cholecystectomy, cesarean section  CT ABDOMEN AND PELVIS WITHOUT CONTRAST  Technique:  Multidetector CT imaging of the abdomen and pelvis was performed following the standard protocol without intravenous contrast. Sagittal and coronal MPR images reconstructed from axial data set.  Comparison: 08/15/2010  Findings: Lung bases clear. Mild right hydronephrosis and hydroureter secondary to a 4 mm diameter distal right ureteral calculus image 56. No additional urinary tract calcification. Within limits of a nonenhanced exam, no focal abnormalities of the liver, spleen, pancreas, left kidney, or adrenal glands. Gallbladder surgically absent.  Decompressed bladder, uterus and adnexae normal appearance. Normal appendix. Tiny periumbilical nodules question normal sized lymph nodes, unchanged. Stomach and bowel loops grossly normal appearance. No mass, adenopathy, free fluid, or inflammatory process. Scattered pelvic phleboliths.  IMPRESSION: Mild right hydronephrosis and hydroureter secondary to a 4 mm diameter distal right ureteral calculus.  Original Report Authenticated By: Lollie Marrow, M.D.   Dg Abd 1 View 01/20/2011  *RADIOLOGY REPORT*  Clinical Data: Pain.  History of right urinary tract stone.  ABDOMEN - 1 VIEW  Comparison: CT abdomen and pelvis 01/19/2011.  Plain films lumbar spine 02/13/2010 and single view of the abdomen 02/06/2007.  Findings: Four calcifications are seen in the right hemi pelvis with central lucencies consistent with phleboliths which are unchanged.  No plain film evidence of urinary tract stone is  identified.  Small stones could be obscured by gas and stool. Bowel gas pattern is unremarkable.  IMPRESSION: No plain film evidence of urinary tract stone.  Pelvic phleboliths are noted.  Original Report Authenticated By: Bernadene Bell. D'ALESSIO, M.D.     8:21 PM:  UA contaminated, UC pending.  Tol PO well while in ED.  Pain controlled with PO meds.  VSS, remains afebrile.  Wants to go home now.  Long d/w pt and spouse re: ureteral calculi treatment and course.  Dx testing d/w pt and family.  Questions answered.  Verb understanding, agreeable to d/c home with outpt f/u.        Maxen Rowland Allison Quarry, DO 01/21/11 [phone]

## 2011-01-23 ENCOUNTER — Encounter: Payer: Self-pay | Admitting: Family Medicine

## 2011-01-23 ENCOUNTER — Ambulatory Visit (INDEPENDENT_AMBULATORY_CARE_PROVIDER_SITE_OTHER): Payer: Self-pay | Admitting: Family Medicine

## 2011-01-23 DIAGNOSIS — I10 Essential (primary) hypertension: Secondary | ICD-10-CM

## 2011-01-23 DIAGNOSIS — N200 Calculus of kidney: Secondary | ICD-10-CM

## 2011-01-23 DIAGNOSIS — G4733 Obstructive sleep apnea (adult) (pediatric): Secondary | ICD-10-CM

## 2011-01-23 DIAGNOSIS — E119 Type 2 diabetes mellitus without complications: Secondary | ICD-10-CM

## 2011-01-23 MED ORDER — LISINOPRIL-HYDROCHLOROTHIAZIDE 10-12.5 MG PO TABS: 1.0000 | ORAL_TABLET | Freq: Every day | ORAL | Status: DC

## 2011-01-23 NOTE — Patient Instructions (Addendum)
Stop taking quinapril  Start taking lisinopril-HCTZ- It is on [facility]'s $4 list  Write down your blood sugars for me  Take your lantus every night instead of morning  Make follow-up with me in 10-14 days  Make follow-up with mood disorder clinic. Please contact Dr. Pascal Lux, clinical psychologist, to set up an appointment.  She can be reached at [phone].

## 2011-01-23 NOTE — Progress Notes (Signed)
  Subjective:    Patient ID: Margaret Larsen, female    DOB: 02-20-1970, 41 y.o.   MRN: [id number]  HPI Here for follow-up of ER dx of kiodney stones  Kidney Stone:  Dx 01/18/11 in ER by CT  Was given rx for vicodin, naproxen, phenergan, tamsulosin 01/20/11:  Rx for zofran, ibuprofen, oxycodone. No stone seen on xray. Today, patient's flank pain is resolved,  No dysuria, hematuria, flank pain, fever, abdominal.  Feels kidney stone may have passed.  HYPERTENSION  BP Readings from Last 3 Encounters:  01/23/11 165/113  01/20/11 113/71  01/19/11 117/78    Hypertension ROS: taking medications as instructed, no medication side effects noted, patient does not perform home BP monitoring, no TIA's, no chest pain on exertion, no dyspnea on exertion, no swelling of ankles and no intermittent claudication symptoms.    DIABETES  Taking and tolerating: yes Fasting blood sugars: not checking Hypoglycemic symptoms: no  Diabetic Labs:  Lab Results  Component Value Date   HGBA1C 7.4 10/18/2010   HGBA1C 8.3 07/10/2010   HGBA1C 8.0 02/13/2010   Lab Results  Component Value Date   LDLCALC 109* 03/15/2010   CREATININE 0.90 01/20/2011   Last microalbumin: No results found for this basename: MICROALBUR, [id number]   OSA:  Is still in the process of obtaining her CPAP.  Review of Systemssee above   Objective:   Physical Exam GEN: Alert & Oriented, No acute distress CV:  Regular Rate & Rhythm, no murmur Respiratory:  Normal work of breathing, CTAB Abd:  + BS, soft, no tenderness to palpation, no flank pain Ext: no pre-tibial edema        Assessment & Plan:

## 2011-01-24 DIAGNOSIS — N200 Calculus of kidney: Secondary | ICD-10-CM | POA: Insufficient documentation

## 2011-01-24 NOTE — Assessment & Plan Note (Signed)
Poorly controlled.  Will d/c quinapril from [facility] and ask her to pick up lisinopril-htcz at [facility].  Will return in 10-14 days for rechekc

## 2011-01-24 NOTE — Assessment & Plan Note (Signed)
Called [facility] to help facilitate medical supply for patient.  She will be able to pick it up this week.

## 2011-01-24 NOTE — Assessment & Plan Note (Signed)
Advised to start checking blood sugars, and go back up to lantus dosage as prescribed.  Will get a1c in visit in 10-14 days.

## 2011-01-24 NOTE — Assessment & Plan Note (Signed)
Reviewed ER note showing 4 mm stone with mild hydronephrosis, now with clinically resolves stone as well as not visualized on xray.

## 2011-01-31 ENCOUNTER — Emergency Department (HOSPITAL_COMMUNITY): Payer: Self-pay

## 2011-01-31 ENCOUNTER — Other Ambulatory Visit: Payer: Self-pay

## 2011-01-31 ENCOUNTER — Encounter (HOSPITAL_COMMUNITY): Payer: Self-pay

## 2011-01-31 ENCOUNTER — Emergency Department (HOSPITAL_COMMUNITY)
Admission: EM | Admit: 2011-01-31 | Discharge: 2011-01-31 | Disposition: A | Discharge status: Home / Self Care | Payer: Self-pay | Attending: Emergency Medicine | Admitting: Emergency Medicine

## 2011-01-31 DIAGNOSIS — E119 Type 2 diabetes mellitus without complications: Secondary | ICD-10-CM | POA: Insufficient documentation

## 2011-01-31 DIAGNOSIS — R0789 Other chest pain: Secondary | ICD-10-CM | POA: Insufficient documentation

## 2011-01-31 DIAGNOSIS — J111 Influenza due to unidentified influenza virus with other respiratory manifestations: Secondary | ICD-10-CM | POA: Insufficient documentation

## 2011-01-31 DIAGNOSIS — I10 Essential (primary) hypertension: Secondary | ICD-10-CM | POA: Insufficient documentation

## 2011-01-31 DIAGNOSIS — IMO0001 Reserved for inherently not codable concepts without codable children: Secondary | ICD-10-CM | POA: Insufficient documentation

## 2011-01-31 DIAGNOSIS — R509 Fever, unspecified: Secondary | ICD-10-CM | POA: Insufficient documentation

## 2011-01-31 DIAGNOSIS — R112 Nausea with vomiting, unspecified: Secondary | ICD-10-CM | POA: Insufficient documentation

## 2011-01-31 DIAGNOSIS — R05 Cough: Secondary | ICD-10-CM | POA: Insufficient documentation

## 2011-01-31 DIAGNOSIS — R059 Cough, unspecified: Secondary | ICD-10-CM | POA: Insufficient documentation

## 2011-01-31 DIAGNOSIS — Z794 Long term (current) use of insulin: Secondary | ICD-10-CM | POA: Insufficient documentation

## 2011-01-31 HISTORY — DX: Essential (primary) hypertension: I10

## 2011-01-31 LAB — COMPREHENSIVE METABOLIC PANEL
ALT: 8 U/L (ref 0–35)
AST: 12 U/L (ref 0–37)
Alkaline Phosphatase: 83 U/L (ref 39–117)
CO2: 31 mEq/L (ref 19–32)
GFR calc Af Amer: >90 mL/min (ref >90)
GFR calc non Af Amer: >90 mL/min (ref >90)
Glucose, Bld: 130 mg/dL — ABNORMAL HIGH (ref 70–99)
Potassium: 4.5 mEq/L (ref 3.5–5.1)
Sodium: 138 mEq/L (ref 135–145)

## 2011-01-31 LAB — CBC
Hemoglobin: 13.4 g/dL (ref 12.0–15.0)
RBC: 4.72 MIL/uL (ref 3.87–5.11)
WBC: 10 10*3/uL (ref 4.0–10.5)

## 2011-01-31 LAB — TROPONIN I
Troponin I: <0.3 ng/mL (ref <0.30)
Troponin I: <0.3 ng/mL (ref <0.30)

## 2011-01-31 LAB — DIFFERENTIAL
Lymphocytes Relative: 29 % (ref 12–46)
Lymphs Abs: 2.9 10*3/uL (ref 0.7–4.0)
Monocytes Relative: 6 % (ref 3–12)
Neutro Abs: 6.3 10*3/uL (ref 1.7–7.7)
Neutrophils Relative %: 63 % (ref 43–77)

## 2011-01-31 MED ORDER — OSELTAMIVIR PHOSPHATE 75 MG PO CAPS: 75.0000 mg | ORAL_CAPSULE | Freq: Two times a day (BID) | ORAL | Status: DC

## 2011-01-31 MED ORDER — KETOROLAC TROMETHAMINE 60 MG/2ML IM SOLN: 60.0000 mg | Freq: Once | INTRAMUSCULAR | Status: DC

## 2011-01-31 MED ORDER — ACETAMINOPHEN 325 MG PO TABS
650.0000 mg | ORAL_TABLET | Freq: Once | ORAL | Status: AC
  Administered 2011-01-31: 650 mg via ORAL
  Filled 2011-01-31: qty 2

## 2011-01-31 MED ORDER — ONDANSETRON HCL 4 MG/2ML IJ SOLN
4.0000 mg | Freq: Once | INTRAMUSCULAR | Status: AC
  Administered 2011-01-31: 4 mg via INTRAVENOUS
  Filled 2011-01-31: qty 2

## 2011-01-31 MED ORDER — HYDROMORPHONE HCL PF 1 MG/ML IJ SOLN
0.5000 mg | Freq: Once | INTRAMUSCULAR | Status: AC
  Administered 2011-01-31: 0.5 mg via INTRAVENOUS
  Filled 2011-01-31: qty 1

## 2011-01-31 MED ORDER — SODIUM CHLORIDE 0.9 % IV BOLUS (SEPSIS)
1000.0000 mL | Freq: Once | INTRAVENOUS | Status: AC
  Administered 2011-01-31: 1000 mL via INTRAVENOUS

## 2011-01-31 MED ORDER — OSELTAMIVIR PHOSPHATE 75 MG PO CAPS: 75.0000 mg | ORAL_CAPSULE | Freq: Two times a day (BID) | ORAL | Status: AC

## 2011-01-31 MED ORDER — ASPIRIN 325 MG PO TABS
325.0000 mg | ORAL_TABLET | Freq: Once | ORAL | Status: AC
  Administered 2011-01-31: 325 mg via ORAL
  Filled 2011-01-31: qty 1

## 2011-01-31 NOTE — ED Notes (Addendum)
Per EMS pt reports vomiting x1 this morning. Also has cough and congestion x2-3 weeks and chest pain with cough moving down left arm. Chest pain is mainly in left chest feels like pins and needles. 20g to RAC and NSR in and out of trigeminy on monitor in route.

## 2011-01-31 NOTE — ED Notes (Signed)
Informed pt and family member that we are waiting on last troponin to result. Pt denies any pain at this time. No distress noted at this time.

## 2011-01-31 NOTE — ED Provider Notes (Signed)
History     CSN: [id number]  Arrival date & time 01/31/11  4098   None     Chief Complaint  Patient presents with  . Chest Pain  . Cough    (Consider location/radiation/quality/duration/timing/severity/associated sxs/prior treatment) Patient is a 41 y.o. female presenting with chest pain, cough, and URI. The history is provided by the patient and the EMS personnel.  Chest Pain Episode onset: about 5 hours ago. Chest pain occurs intermittently. The chest pain is unchanged. Associated with: coughing. The severity of the pain is mild. The quality of the pain is described as sharp. The pain does not radiate. Exacerbated by: coughing. Primary symptoms include a fever (subjective), cough, nausea and vomiting. Pertinent negatives for primary symptoms include no fatigue, no syncope, no shortness of breath, no wheezing, no palpitations, no abdominal pain, no dizziness and no altered mental status.  Pertinent negatives for associated symptoms include no claudication, no diaphoresis, no lower extremity edema, no near-syncope, no numbness, no orthopnea, no paroxysmal nocturnal dyspnea and no weakness. She tried nothing for the symptoms.  Her past medical history is significant for diabetes and hypertension.  Pertinent negatives for past medical history include no seizures.    Cough Associated symptoms include chest pain, chills, rhinorrhea and myalgias. Pertinent negatives include no headaches, no sore throat, no shortness of breath, no wheezing and no eye redness.  URI The primary symptoms include fever (subjective), cough, nausea, vomiting and myalgias. Primary symptoms do not include fatigue, headaches, sore throat, wheezing, abdominal pain, arthralgias or rash. Episode onset: about 2 weeks ago. This is a new problem. The problem has been gradually worsening.  The myalgias are not associated with weakness.  Associated with: no recent travel  Symptoms associated with the illness include chills,  congestion and rhinorrhea.    Past Medical History  Diagnosis Date  . Diabetes mellitus   . Renal disorder   . Kidney stones   . Hypertension   . Kidney stones     Past Surgical History  Procedure Date  . Cholecystectomy     Family History  Problem Relation Age of Onset  . Hypertension Mother   . Diabetes Mother   . Cancer Mother     History  Substance Use Topics  . Smoking status: Never Smoker   . Smokeless tobacco: Never Used  . Alcohol Use: No    OB History    Grav Para Term Preterm Abortions TAB SAB Ect Mult Living                  Review of Systems  Constitutional: Positive for fever (subjective) and chills. Negative for diaphoresis, activity change, appetite change and fatigue.  HENT: Positive for congestion and rhinorrhea. Negative for sore throat, neck pain and neck stiffness.   Eyes: Negative for photophobia, redness and visual disturbance.  Respiratory: Positive for cough. Negative for shortness of breath and wheezing.   Cardiovascular: Positive for chest pain. Negative for palpitations, orthopnea, claudication, leg swelling, syncope and near-syncope.  Gastrointestinal: Positive for nausea and vomiting. Negative for abdominal pain, diarrhea, constipation and blood in stool.  Genitourinary: Negative for dysuria, urgency, hematuria and flank pain.  Musculoskeletal: Positive for myalgias. Negative for back pain and arthralgias.  Skin: Negative for rash and wound.  Neurological: Negative for dizziness, seizures, facial asymmetry, speech difficulty, weakness, light-headedness, numbness and headaches.  Psychiatric/Behavioral: Negative for confusion and altered mental status.  All other systems reviewed and are negative.    Allergies  Allegra-d; Codeine; Diphenhydramine hcl;  and Peanut-containing drug products  Home Medications   Current Outpatient Rx  Name Route Sig Dispense Refill  . ASPIRIN 81 MG PO TBEC Oral Take 81 mg by mouth daily.      .  ATORVASTATIN CALCIUM 20 MG PO TABS Oral Take 20 mg by mouth daily.    Marland Kitchen DIVALPROEX SODIUM ER 500 MG PO TB24 Oral Take 1,000 mg by mouth daily.      Marland Kitchen EPINEPHRINE 0.3 MG/0.3ML IJ DEVI Intramuscular Inject 0.3 mLs (0.3 mg total) into the muscle once. if having allergic reaction with trouble breathing, usep en and go straight to ER 1 Device 1  . HYDROCORTISONE ACETATE 25 MG RE SUPP Rectal Place 25 mg rectally every 12 (twelve) hours as needed.    . IBUPROFEN 800 MG PO TABS Oral Take 800 mg by mouth every 8 (eight) hours as needed. As needed for pain    . INSULIN ASPART 100 UNIT/ML [place] SOLN Subcutaneous Inject 4 Units into the skin 3 (three) times daily before meals.    . INSULIN GLARGINE 100 UNIT/ML [place] SOLN Subcutaneous Inject 55 Units into the skin daily.    Marland Kitchen LISINOPRIL-HYDROCHLOROTHIAZIDE 10-12.5 MG PO TABS Oral Take 1 tablet by mouth daily. 30 tablet 0  . MOMETASONE FUROATE 50 MCG/ACT NA SUSP Nasal Place 2 sprays into the nose daily.    Marland Kitchen MONTELUKAST SODIUM 10 MG PO TABS Oral Take 10 mg by mouth at bedtime.    Marland Kitchen NAPROXEN 500 MG PO TABS Oral Take 1 tablet (500 mg total) by mouth 2 (two) times daily with a meal. 30 tablet 0  . TAMSULOSIN HCL 0.4 MG PO CAPS Oral Take 1 capsule (0.4 mg total) by mouth 2 (two) times daily. 10 capsule 0    BP 104/88  Pulse 88  Temp(Src) 97.8 F (36.6 C) (Oral)  Resp 18  Ht 4\' 11"  (1.499 m)  Wt 225 lb (102.059 kg)  BMI 45.44 kg/m2  SpO2 100%  Physical Exam  Nursing note and vitals reviewed. Constitutional: She is oriented to person, place, and time. She appears well-developed and well-nourished.  Non-toxic appearance. No distress.  HENT:  Head: Normocephalic and atraumatic.  Nose: Rhinorrhea present. Right sinus exhibits no maxillary sinus tenderness and no frontal sinus tenderness. Left sinus exhibits no maxillary sinus tenderness and no frontal sinus tenderness.  Mouth/Throat: Oropharynx is clear and moist.  Eyes: Conjunctivae and EOM are normal. Pupils are  equal, round, and reactive to light. No scleral icterus.  Neck: Normal range of motion and full passive range of motion without pain. Neck supple. No JVD present.  Cardiovascular: Normal rate, regular rhythm, normal heart sounds and intact distal pulses.   No murmur heard. Pulmonary/Chest: Effort normal and breath sounds normal. No respiratory distress. She has no wheezes. She has no rales.  Abdominal: Soft. Bowel sounds are normal. She exhibits no distension. There is no tenderness. There is no rebound and no guarding.  Musculoskeletal: Normal range of motion.  Neurological: She is alert and oriented to person, place, and time. She has normal strength. No cranial nerve deficit. GCS eye subscore is 4. GCS verbal subscore is 5. GCS motor subscore is 6.  Skin: Skin is warm and dry. No rash noted. She is not diaphoretic.  Psychiatric: She has a normal mood and affect.    ED Course  Procedures (including critical care time)  ED ECG REPORT   Date: 01/31/2011  EKG Time: 08:23  Rate: 84  Rhythm: normal sinus rhythm with trigeminy,  normal EKG, normal sinus rhythm, unchanged from previous tracings except for presence of trugeminy  Axis: normal  Intervals:none  ST&T Change: Twave inversions present in V2-V4  Narrative Interpretation: Trigeminy otherwise no change from 09/23/08.            Labs Reviewed  COMPREHENSIVE METABOLIC PANEL - Abnormal; Notable for the following:    Glucose, Bld 130 (*)    Albumin 2.4 (*)    Total Bilirubin 0.2 (*)    All other components within normal limits  GLUCOSE, CAPILLARY - Abnormal; Notable for the following:    Glucose-Capillary 132 (*)    All other components within normal limits  CBC  DIFFERENTIAL  TROPONIN I  TROPONIN I  POCT CBG MONITORING   Dg Chest 2 View  01/31/2011  *RADIOLOGY REPORT*  Clinical Data: Cough, shortness of breath and mid chest pain.  CHEST - 2 VIEW  Comparison: 08/15/2010 and 11/26/2008.  Findings: Trachea is midline.  Heart  size normal. Mild prominence of the lower right paratracheal stripe is intermittently seen on prior exams.  Lungs are clear.  No pleural fluid.  IMPRESSION: No acute findings.  Original Report Authenticated By: Reyes Ivan, M.D.     1. Atypical chest pain   2. Influenza-like syndrome       MDM  40yo AAF with PMH significant for DM, kidney stones, and HTN who presents to the ED due to cough and nasal congestion for 2 weeks. Now pt having myalgias nausea vomiting and diarrhea. This morning called EMS because she woke up around 3 AM to take her husband to work and noticed that every time she coughed she felt a sharp tingling feeling all across her chest on the left and right. No dyspnea. Subjective fevers. Pt non-toxic appearing. Afebrile at this time. No abd tenderness, but had one episode of non-bloody non-bilious emesis this morning and having diarrhea.  Likely flu. Doubt ACS or PE. Will r/o PNA with CXR. Given hx dm and htn although no EKG changes other than trigeminy at time of EKG and story not c/w ACS will do delta troponin now and at 3h. Giving fluids and zofran. CBC and BMP ordered. BG 132.   CXR neg. Initial trop neg.   Pt CP free feeling better. Plan for repeat trop.   Repeat trop neg.  Pt reassessed and she is feeling better. Still has some nasal congestion. Wants to go home. Will have her f/u with her PCP for recheck tomorrow. Will d/c on tamiflu.     Verne Carrow, MD 01/31/11 [phone]

## 2011-01-31 NOTE — ED Notes (Signed)
PT. UNDRESS IN A GOWN  On THE MONITOR

## 2011-01-31 NOTE — ED Provider Notes (Signed)
I saw and evaluated the patient, reviewed the resident's note and I agree with the findings and plan.  Cyndra Numbers, MD 01/31/11 [phone]

## 2011-02-04 ENCOUNTER — Ambulatory Visit (INDEPENDENT_AMBULATORY_CARE_PROVIDER_SITE_OTHER): Payer: Self-pay | Admitting: Family Medicine

## 2011-02-04 ENCOUNTER — Encounter: Payer: Self-pay | Admitting: Family Medicine

## 2011-02-04 VITALS — BP 156/102 | HR 85 | Temp 97.5°F | Ht 59.0 in | Wt 217.0 lb

## 2011-02-04 DIAGNOSIS — E118 Type 2 diabetes mellitus with unspecified complications: Secondary | ICD-10-CM

## 2011-02-04 DIAGNOSIS — J069 Acute upper respiratory infection, unspecified: Secondary | ICD-10-CM

## 2011-02-04 DIAGNOSIS — I10 Essential (primary) hypertension: Secondary | ICD-10-CM

## 2011-02-04 LAB — POCT GLYCOSYLATED HEMOGLOBIN (HGB A1C): Hemoglobin A1C: 7.8

## 2011-02-04 LAB — BASIC METABOLIC PANEL
BUN: 11 mg/dL (ref 6–23)
Potassium: 3.9 mEq/L (ref 3.5–5.3)
Sodium: 138 mEq/L (ref 135–145)

## 2011-02-04 NOTE — Assessment & Plan Note (Signed)
I suspect will need upward titration of lantus, she is not currently taking any meal coverage.  Will check a1c today and patient will return in 1 week with blood sugar for further medication adjustmebnt

## 2011-02-04 NOTE — Assessment & Plan Note (Signed)
Symptoms consistent with URI.   Normal lung exam.  Advised oral hydration, wrote work letter for absence for 3 days.  Encouraged patient to use this time to take care of herself, and focus on monitoring diabetes and writing down blood sugars.

## 2011-02-04 NOTE — Progress Notes (Signed)
  Subjective:    Patient ID: Margaret Larsen, female    DOB: 1970/11/10, 41 y.o.   MRN: [id number]  HPIHere for follow-up  URI:  2 days of body aches, headaches and 2 weeks of cough from URI.  Went to ER 3-4 days ago,  rxed tamiflu and had neg cxr  No fever, no abdominal pain , no dyspnea.  BM's soft no blood, every day.  NO dysuria.  Mood is good per patient, taking depakote, partner says she has been smiling.  HYPERTENSION  BP Readings from Last 3 Encounters:  02/04/11 156/102  01/31/11 95/53  01/23/11 165/113    Started lisinopril at last visit.  No problems with compliance, picked it up the next day.  Hypertension ROS: taking medications as instructed, no medication side effects noted, patient does not perform home BP monitoring, no chest pain on exertion, no dyspnea on exertion, no swelling of ankles and no intermittent claudication symptoms.    DIABETES  Taking and tolerating: yes- lantus Fasting blood sugars:unclear- wrote down numbera ranging from low 100's to as high as 400's.  Most in upper 100's to mid 200's. Hypoglycemic symptoms: no  Diabetic Labs:  Lab Results  Component Value Date   HGBA1C 7.4 10/18/2010   HGBA1C 8.3 07/10/2010   HGBA1C 8.0 02/13/2010   Lab Results  Component Value Date   LDLCALC 109* 03/15/2010   CREATININE 0.77 01/31/2011   Last microalbumin: No results found for this basename: MICROALBUR, [id number]         Review of Systemssee HPI     Objective:   Physical Exam  GEN: Alert & Oriented, No acute distress.  Here with partner CV:  Regular Rate & Rhythm, no murmur Respiratory:  Normal work of breathing, CTAB Abd:  + BS, soft, no tenderness to palpation        Assessment & Plan:

## 2011-02-04 NOTE — Patient Instructions (Signed)
Take ibuprofen for pain Keep strict blood sugar log until I see you again in 1-2 weeks

## 2011-02-04 NOTE — Assessment & Plan Note (Addendum)
Will check bmet today.  If BP remains elevated at next visit when not acutely ill/ taking OTC meds, will increase

## 2011-02-05 ENCOUNTER — Ambulatory Visit: Payer: Self-pay | Admitting: Family Medicine

## 2011-02-12 ENCOUNTER — Encounter: Payer: Self-pay | Admitting: Family Medicine

## 2011-02-14 ENCOUNTER — Ambulatory Visit: Payer: Self-pay | Admitting: Family Medicine

## 2011-03-01 ENCOUNTER — Encounter: Payer: Self-pay | Admitting: Family Medicine

## 2011-03-05 ENCOUNTER — Telehealth: Payer: Self-pay | Admitting: Family Medicine

## 2011-03-05 ENCOUNTER — Other Ambulatory Visit: Payer: Self-pay | Admitting: Family Medicine

## 2011-03-05 NOTE — Telephone Encounter (Signed)
Dr. Earnest Bailey, I don't see an up to date one in here.

## 2011-03-05 NOTE — Telephone Encounter (Signed)
I left message on patients voicemail.  Will try to call back at a later time.

## 2011-03-05 NOTE — Telephone Encounter (Signed)
Patient was told that her Rx for Depakote expired in January.  She said that the MAP program was told by someone at [facility] that the Rx was written by Dr. Pascal Lux which is incorrect, it is written by Dr. Earnest Bailey.  She would like someone to call her to help her get this corrected.

## 2011-03-11 ENCOUNTER — Telehealth: Payer: Self-pay | Admitting: Family Medicine

## 2011-03-11 NOTE — Telephone Encounter (Signed)
Ms. Weitman called back saying she never got call with msg left for her.  Still asking about refill on medicine. Want Dr. Earnest Bailey to call her back with correct info and send to pharmacy.   Please call [phone]

## 2011-03-12 MED ORDER — DIVALPROEX SODIUM ER 500 MG PO TB24: 1000.0000 mg | ORAL_TABLET | Freq: Every day | ORAL | Status: DC

## 2011-03-12 NOTE — Telephone Encounter (Signed)
Placed rx in fax pile.  Patient is currently  till taking- previous refills not reflected in med rec because she gets medication free from MAP program

## 2011-03-18 ENCOUNTER — Other Ambulatory Visit: Payer: Self-pay | Admitting: Family Medicine

## 2011-03-18 MED ORDER — INSULIN ASPART 100 UNIT/ML ~~LOC~~ SOLN: 4.0000 [IU] | Freq: Three times a day (TID) | SUBCUTANEOUS | Status: DC

## 2011-03-20 ENCOUNTER — Encounter: Payer: Self-pay | Admitting: Family Medicine

## 2011-03-20 ENCOUNTER — Ambulatory Visit (INDEPENDENT_AMBULATORY_CARE_PROVIDER_SITE_OTHER): Payer: Self-pay | Admitting: Family Medicine

## 2011-03-20 VITALS — BP 120/82 | HR 72 | Temp 97.8°F | Wt 218.0 lb

## 2011-03-20 DIAGNOSIS — G5711 Meralgia paresthetica, right lower limb: Secondary | ICD-10-CM

## 2011-03-20 DIAGNOSIS — G5710 Meralgia paresthetica, unspecified lower limb: Secondary | ICD-10-CM

## 2011-03-20 MED ORDER — GABAPENTIN 100 MG PO CAPS: 100.0000 mg | ORAL_CAPSULE | Freq: Three times a day (TID) | ORAL | Status: DC

## 2011-03-20 NOTE — Progress Notes (Signed)
  Subjective:    Patient ID: Margaret Larsen, female    DOB: 11/17/1970, 41 y.o.   MRN: [id number]  HPI Pt presents today as a work in visit for lateral leg burning, tingling and numbness. Pt states that this has been present for several months. Pt states that pain seems to be worsened with prolonged episodes of standing at work. Pt denies any trauma associated with pain. Pt also reports some low back pain associated with lateral thigh pain at times. Pt denies any radicular back pain that radiates down leg. Pt states that she is able to ambulate without weakness. Pain is relieved with sitting. Pt states that she has had the sensation of her R leg being swollen during episodes of lateral thigh burning. Pt denies any history of DVT, no immobility, no prolonged traveling.    Review of Systems See HPI, otherwise ROS negative.     Objective:   Physical Exam Gen: in bed, NAD,  MSK:  Lower extremities symmetric No popliteal tenderness Hip: ROM General ROM decreased secondary to body habitus.  Strength IR: 5/5, ER: 5/5, Flexion: 5/5, Extension: 5/5, Abduction: 5/5, Adduction: 5/5 Pelvic alignment unremarkable to inspection and palpation. Standing hip rotation and gait without trendelenburg / unsteadiness. Greater trochanter without tenderness to palpation. No tenderness over piriformis and greater trochanter. No SI joint tenderness and normal minimal SI movement.  Neuro: CN II-XII grossly intact, mild sensory deficit to light touch along distribution of R LFC nerve.     Assessment & Plan:

## 2011-03-20 NOTE — Patient Instructions (Signed)
Meralgia Paresthetica  Meralgia paresthetica (MP) is a disorder characterized by tingling, numbness, and burning pain in the outer side of the thigh. It occurs in men more than women. MP is generally found in middle-aged or overweight people. Sometimes, the disorder may disappear. CAUSES The disorder is caused by a nerve in the thigh being squeezed (compressed). MP may be associated with tight clothing, pregnancy, diabetes, and being overweight (obese). SYMPTOMS  Tingling, numbness, and burning in the outer thigh.   An area of the skin may be painful and sensitive to the touch.  The symptoms often worsen after walking or standing. TREATMENT  Treatment is based on your symptoms and is mainly supportive. Treatment may include:  Wearing looser clothing.   Losing weight.   Avoiding prolonged standing or walking.   Taking medication.   Surgery if the pain is peristent or severe.  MP usually eases or disappears after treatment. Surgery is not always fully successful. Document Released: 12/14/2001 Document Revised: 12/13/2010 Document Reviewed: 12/24/2004 [employer] Patient Information 2012 [employer]. 

## 2011-03-20 NOTE — Assessment & Plan Note (Addendum)
Handout given. Discussed weight loss. Will treat with neurontin for neuropathic symptoms. Follow up with PCP.

## 2011-03-28 ENCOUNTER — Encounter: Payer: Self-pay | Admitting: Family Medicine

## 2011-04-03 ENCOUNTER — Encounter: Payer: Self-pay | Admitting: Family Medicine

## 2011-04-03 ENCOUNTER — Ambulatory Visit (INDEPENDENT_AMBULATORY_CARE_PROVIDER_SITE_OTHER): Payer: Self-pay | Admitting: Family Medicine

## 2011-04-03 VITALS — BP 141/98 | HR 92 | Temp 98.1°F | Ht 59.0 in | Wt 220.3 lb

## 2011-04-03 DIAGNOSIS — G5711 Meralgia paresthetica, right lower limb: Secondary | ICD-10-CM

## 2011-04-03 DIAGNOSIS — E1149 Type 2 diabetes mellitus with other diabetic neurological complication: Secondary | ICD-10-CM

## 2011-04-03 DIAGNOSIS — E118 Type 2 diabetes mellitus with unspecified complications: Secondary | ICD-10-CM

## 2011-04-03 DIAGNOSIS — E1142 Type 2 diabetes mellitus with diabetic polyneuropathy: Secondary | ICD-10-CM

## 2011-04-03 DIAGNOSIS — G5710 Meralgia paresthetica, unspecified lower limb: Secondary | ICD-10-CM

## 2011-04-03 DIAGNOSIS — I10 Essential (primary) hypertension: Secondary | ICD-10-CM

## 2011-04-03 LAB — POCT UA - GLUCOSE/PROTEIN: Glucose, UA: NEGATIVE

## 2011-04-03 MED ORDER — LISINOPRIL-HYDROCHLOROTHIAZIDE 10-12.5 MG PO TABS: 1.0000 | ORAL_TABLET | Freq: Every day | ORAL | Status: DC

## 2011-04-03 MED ORDER — NORTRIPTYLINE HCL 25 MG PO CAPS: 25.0000 mg | ORAL_CAPSULE | Freq: Every day | ORAL | Status: DC

## 2011-04-03 NOTE — Assessment & Plan Note (Signed)
Agree with diagnosis.  Suportive care.  Will start nortrytyline for peripheral neuropathy (diabetes)

## 2011-04-03 NOTE — Assessment & Plan Note (Addendum)
Will change gabapentin to nortryptyline.  Discussed risks of mood instability.  She is aware and will monitor.  Encouraged improved glycemic control.

## 2011-04-03 NOTE — Assessment & Plan Note (Signed)
Will increase lantus from 55 to 60, encouraged to take novolog more regularly.  Will bring log to follow-up in 1 month

## 2011-04-03 NOTE — Assessment & Plan Note (Signed)
Not taking medication.  Will refill and encouraged her on its importance.

## 2011-04-03 NOTE — Progress Notes (Signed)
  Subjective:    Patient ID: Margaret Larsen, female    DOB: [birth year], 41 y.o.   MRN: [id number]  HPI Follow-up for chronic medical conditions  Paraesthesias:  Was dx with meralgia paresthetica several week ago by another provider.  Right leg numbness lateral side of leg down to her lower leg, feels like it swells when she stands- "pins and needles feeling"  Feels she has to shake it at times.  Bilateral foot paraesthesias.  Gabapentin made her back stiff and having headaches..   Chronic low back pain, no radicular.  Took for 3 days, stopped after discontinued gabapentin.  Takes ibuprofen daily.  Doesn ot take tylenol.  DIABETES  Taking and tolerating: yes Lantus 55 QHS, Novolog once daily- says she doesn't have time to take it at work. Fasting blood sugars:130's, 180's after lunch, at the end of the day Hypoglycemic symptoms: no Visual problems: no Monitoring feet: yes Numbness/Tingling: yes Last eye exam:is scheduled for April  Diabetic Labs:  Lab Results  Component Value Date   HGBA1C 7.8 02/04/2011   HGBA1C 7.4 10/18/2010   HGBA1C 8.3 07/10/2010   Lab Results  Component Value Date   LDLCALC 109* 03/15/2010   CREATININE 0.69 02/04/2011   Last microalbumin: No results found for this basename: MICROALBUR, [id number]   HYPERTENSION  BP Readings from Last 3 Encounters:  04/03/11 141/98  03/20/11 120/82  02/04/11 156/102    Hypertension ROS:Has stopped medicines because she has no refills and says she did not know i wanted her to keep taking it.      Review of Systems See HPI    Objective:   Physical Exam GEN: Alert & Oriented, No acute distress CV:  Regular Rate & Rhythm, no murmur Respiratory:  Normal work of breathing, CTAB Back: No TTP Legs:  approx 4x6 inch area of right anterolateral thigh with decreased sensation tol ight touch Feet:  Skin intact. No sensation to monofilament on bilateral soles. Ext: no pre-tibial edema        Assessment & Plan:

## 2011-04-03 NOTE — Patient Instructions (Addendum)
Keep taking Increase Lantus to 60 units.  Try to use your novolog more regularly.  Will try nortriptyline for nerve pain.  Let me know if you notice any change in moods!  The most important treatment to slow down nerve pain is good diabetes care!!!  Bring your blood sugar log to next appointment.  Follow-up in 1 month

## 2011-04-08 ENCOUNTER — Other Ambulatory Visit: Payer: Self-pay | Admitting: Family Medicine

## 2011-04-11 ENCOUNTER — Other Ambulatory Visit: Payer: Self-pay | Admitting: Family Medicine

## 2011-04-15 ENCOUNTER — Other Ambulatory Visit: Payer: Self-pay | Admitting: Family Medicine

## 2011-04-15 MED ORDER — QUINAPRIL-HYDROCHLOROTHIAZIDE 10-12.5 MG PO TABS: 1.0000 | ORAL_TABLET | Freq: Every day | ORAL | Status: DC

## 2011-04-29 ENCOUNTER — Encounter: Payer: Self-pay | Admitting: Family Medicine

## 2011-05-06 ENCOUNTER — Ambulatory Visit: Payer: Self-pay | Admitting: Family Medicine

## 2011-05-17 ENCOUNTER — Other Ambulatory Visit: Payer: Self-pay | Admitting: Family Medicine

## 2011-05-17 MED ORDER — MONTELUKAST SODIUM 10 MG PO TABS: 10.0000 mg | ORAL_TABLET | Freq: Every day | ORAL | Status: DC

## 2011-05-28 ENCOUNTER — Encounter: Payer: Self-pay | Admitting: Family Medicine

## 2011-05-28 ENCOUNTER — Ambulatory Visit (INDEPENDENT_AMBULATORY_CARE_PROVIDER_SITE_OTHER): Payer: Self-pay | Admitting: Family Medicine

## 2011-05-28 VITALS — BP 122/84 | HR 78 | Temp 97.8°F | Ht <= 58 in | Wt 222.0 lb

## 2011-05-28 DIAGNOSIS — E1142 Type 2 diabetes mellitus with diabetic polyneuropathy: Secondary | ICD-10-CM

## 2011-05-28 DIAGNOSIS — N3941 Urge incontinence: Secondary | ICD-10-CM

## 2011-05-28 DIAGNOSIS — E1149 Type 2 diabetes mellitus with other diabetic neurological complication: Secondary | ICD-10-CM

## 2011-05-28 DIAGNOSIS — R32 Unspecified urinary incontinence: Secondary | ICD-10-CM

## 2011-05-28 DIAGNOSIS — E118 Type 2 diabetes mellitus with unspecified complications: Secondary | ICD-10-CM

## 2011-05-28 DIAGNOSIS — F319 Bipolar disorder, unspecified: Secondary | ICD-10-CM

## 2011-05-28 LAB — POCT URINALYSIS DIPSTICK
Protein, UA: NEGATIVE
Spec Grav, UA: 1.015
Urobilinogen, UA: 1
pH, UA: 7

## 2011-05-28 LAB — POCT GLYCOSYLATED HEMOGLOBIN (HGB A1C): Hemoglobin A1C: 8.2

## 2011-05-28 NOTE — Patient Instructions (Signed)
See info on bladder exercises  Increase novolog to 6 units with each meal  Follow-up and bring log in 1 week

## 2011-05-28 NOTE — Assessment & Plan Note (Addendum)
U/A shows no signs of infection.  unusual timing, as new medication was nortriptyline which, and does not sounds like overflow, but rather urge incontinence.  Given info on Kegel exercises, advised to keep track os when she has symptoms, if related to times when she drinks large iced teas or CBG's are poorly controlled. Gave info on Kegel exercises and timed voiding.  Will follow-up on symptoms at next appointment.

## 2011-05-28 NOTE — Assessment & Plan Note (Signed)
Change from gabapentin to nortriptyline has helped in pain symtpoms

## 2011-05-28 NOTE — Assessment & Plan Note (Addendum)
Could addition of nortriptyline 25 mg have triggers what sounds to be manic episode.   Will check depakote level (takes it at 8 PM).  Patient states she is compliant. May need dose adjustment

## 2011-05-28 NOTE — Assessment & Plan Note (Signed)
Poor control today, a1c deteriorated.    Maintain lantus 60 mg, increase novolog from 4 to 6 units TID with meals.  Will check CBG QID and will bring log and follow-up with me in 1 week.

## 2011-05-28 NOTE — Progress Notes (Signed)
  Subjective:    Patient ID: Margaret Larsen, female    DOB: 11/06/1970, 41 y.o.   MRN: [id number]  HPIf/u chronic medical conditions:   Incontinence: New- one month in duration.  Feels urge to urinate, difficult to get to bathroom in time.  No leakage with coughing or laughing.  Caffeine: drinks iced tea- large [employer] cups several times weekly.  No dysuria, increased frequency. Occurs 2-3 times a week.  DM- 107-208 (most mid 150's).  Taking lantus 60 qpm, novolog 4 units with meals.  Peripheral neuropathy: nortriptyline has made improvement in pain.  OSA;  Wearing CPAP, feeling more rested when she wakes.  Bipolar DO: 2-3 days ago was awake for over 24 hours hours "I felt like I was running a race"  Did not feel tired.  Overall feels in a good mod. not sad.  No risky behaviors reported. Review of Systems See HPI    Objective:   Physical Exam GEN: Alert & Oriented, No acute distress CV:  Regular Rate & Rhythm, no murmur Respiratory:  Normal work of breathing, CTAB Abd:  + BS, soft, no tenderness to palpation Ext: no pre-tibial edema Psych:  Poor eye contact.  Euthymic, good mood.       Assessment & Plan:

## 2011-05-29 ENCOUNTER — Telehealth: Payer: Self-pay | Admitting: Family Medicine

## 2011-05-29 DIAGNOSIS — F319 Bipolar disorder, unspecified: Secondary | ICD-10-CM

## 2011-05-29 MED ORDER — DIVALPROEX SODIUM ER 500 MG PO TB24: 1500.0000 mg | ORAL_TABLET | Freq: Every day | ORAL | Status: DC

## 2011-05-29 NOTE — Assessment & Plan Note (Signed)
Not well controlled.  Depakote level low.  Will increase from 1000 to 1500 mg daily, advised her to come back for recheck depakote level in 5 days

## 2011-05-29 NOTE — Telephone Encounter (Signed)
Spoke with patient , will increase depakote from 1000 to 1500.  Patient to return for depakote blood draw in 5 days

## 2011-06-04 ENCOUNTER — Ambulatory Visit: Payer: Self-pay | Admitting: Family Medicine

## 2011-06-06 ENCOUNTER — Other Ambulatory Visit: Payer: Self-pay | Admitting: Family Medicine

## 2011-06-06 MED ORDER — INSULIN GLARGINE 100 UNIT/ML ~~LOC~~ SOLN: 60.0000 [IU] | SUBCUTANEOUS | Status: DC

## 2011-06-10 ENCOUNTER — Encounter: Payer: Self-pay | Admitting: Family Medicine

## 2011-06-10 ENCOUNTER — Ambulatory Visit (INDEPENDENT_AMBULATORY_CARE_PROVIDER_SITE_OTHER): Payer: Self-pay | Admitting: Family Medicine

## 2011-06-10 VITALS — BP 140/98 | Temp 98.1°F | Ht 59.0 in | Wt 222.0 lb

## 2011-06-10 DIAGNOSIS — F319 Bipolar disorder, unspecified: Secondary | ICD-10-CM

## 2011-06-10 NOTE — Patient Instructions (Addendum)
Please go back to the earlier dose of your depakote (take 2 pills in the morning, so that you get 1000 mg a day). I would like to add another medicine a few weeks to help with the images of people stepping in front of the car.  However, the only medicine that is available is Haldol on the $4 plans or at [facility] Please come back and see Dr. Earnest Bailey in 1-2 weeks.   We will check your levels today.

## 2011-06-11 NOTE — Progress Notes (Signed)
  Subjective:    Patient ID: Margaret Larsen, female    DOB: [birth year], 41 y.o.   MRN: [id number]  HPI 10 you female with BPAD who present wanting to change meds.  Her valproic acid was recently increased due to low levels and possble manic symptoms, and pt states she does not "feel good" on current dose.  She is a poor historian, but is seems her worsening symptoms started a few days ago.  She reports feeling tired, waking frequently, having dreams about people dying (not people she knows), she fell yesterday ( no LOC or chest pain, but "just fell"), her skin burns,she feels more agitated and irritable, she also feels slow, has gained weight and bought a lot of clothes with her daughter this weekend.   She also reports a headache, but says that the nortriptyline has helped with her pain. She reports VH x 1 (thought she saw someone stepping out in front of a car on a highway).  Denies SI or HI.   She would like to change medicines and stop the valproic acid. She reports that she took haloperidol in the past, and it caused knots to form on her skin when she went out in the sun.     Review of Systems See HPI     Objective:   Physical Exam  Nursing note and vitals reviewed. Constitutional:       Obese.  No distress.  Eyes: Conjunctivae and EOM are normal. Pupils are equal, round, and reactive to light. Right eye exhibits no discharge. Left eye exhibits no discharge. No scleral icterus.  Cardiovascular: Normal rate, regular rhythm and normal heart sounds.  Exam reveals no gallop and no friction rub.   No murmur heard. Pulmonary/Chest: Effort normal and breath sounds normal. No respiratory distress. She has no wheezes. She has no rales.  Neurological:       CN II-XII intact except VF not assessed.  Strength 5/5 throughout.  DTRs 1+ symmetrical throughout.  Sensation intact.  Finger to nose intact.  Rapid alternating movements intact.  Heel/toe/tandem gait intact.  No pronator drift.  Neg  Romberg.   Psychiatric:       Normal grooming and dress.  Flat affect.  Non labile.  Nl speech without pressure or LOA or FOI.  Denies SI/HI.            Assessment & Plan:

## 2011-06-11 NOTE — Assessment & Plan Note (Signed)
Pt feels current regimen is not working for her.  Unclear if she is having some manic symptoms (spending spree, agitation), but also feels tired and "slow".  Will decrease valproic acid to earlier dose.  Consider adding antipsychotic, but options are limited by financial constraints, and pt not interested in haloperidol.  Check VPA level and TSH today.  Follow up with PCP in 1-2 weeks.  Contracts for safety.

## 2011-06-12 ENCOUNTER — Other Ambulatory Visit: Payer: Self-pay | Admitting: Family Medicine

## 2011-06-12 MED ORDER — MOMETASONE FUROATE 50 MCG/ACT NA SUSP: 2.0000 | Freq: Every day | NASAL | Status: DC

## 2011-06-12 NOTE — Telephone Encounter (Signed)
Refilled nasal steroid per fax request from pharmacy.

## 2011-06-13 ENCOUNTER — Telehealth: Payer: Self-pay | Admitting: Family Medicine

## 2011-06-13 NOTE — Telephone Encounter (Signed)
Pt is asking about labs for her depakote level.

## 2011-06-17 ENCOUNTER — Telehealth: Payer: Self-pay | Admitting: Family Medicine

## 2011-06-17 NOTE — Telephone Encounter (Signed)
Spoke with patient.  Reluctant but agreeable to consult with mental health for deteriorated mood.  Has had multiple intolerances to medications and financial constraints, specialty care would be most appropriate.  Advised to continue Depakote at current dose, and follow-up with mental Health.

## 2011-06-24 ENCOUNTER — Ambulatory Visit (INDEPENDENT_AMBULATORY_CARE_PROVIDER_SITE_OTHER): Payer: Self-pay | Admitting: Family Medicine

## 2011-06-24 DIAGNOSIS — E118 Type 2 diabetes mellitus with unspecified complications: Secondary | ICD-10-CM

## 2011-06-24 DIAGNOSIS — R300 Dysuria: Secondary | ICD-10-CM

## 2011-06-24 DIAGNOSIS — F319 Bipolar disorder, unspecified: Secondary | ICD-10-CM

## 2011-06-24 DIAGNOSIS — R34 Anuria and oliguria: Secondary | ICD-10-CM

## 2011-06-24 LAB — POCT URINALYSIS DIPSTICK
Bilirubin, UA: NEGATIVE
Glucose, UA: NEGATIVE
Leukocytes, UA: NEGATIVE
pH, UA: 8

## 2011-06-24 NOTE — Assessment & Plan Note (Addendum)
In and out cath 100 cc.   U/A today no signs of infections.  After much discussion, patient reveals, she actually does urinate, she just feels it is a weaker stream than usual.  Advised her to try d/c nortriptyline to see if she really needs it and it if makes a difference in urinary symptoms. .  Will follow-up in 1 week

## 2011-06-24 NOTE — Patient Instructions (Addendum)
See glucose log- keep log and bring back in 1 week for office visit  Drink lots of water  Take Miralax for constipation  Talk with mental health about your symptoms  If you do not urinate in 24 hours, please call our office for evaluation

## 2011-06-24 NOTE — Assessment & Plan Note (Signed)
Wrote out on glucose log to take novolog 6 units with each meal and 60 units of lantus.  Also gave rx for diabetes supplies as she has lost her glucometer.  Will bring back log and follow-up in 1 week

## 2011-06-24 NOTE — Assessment & Plan Note (Signed)
No change in bipolar symptoms.  No SI, HI.  Is now udner care of Mental Health.  Encouraged her to continue abilify and keep follow-up appt in 1 week

## 2011-06-24 NOTE — Progress Notes (Signed)
  Subjective:    Patient ID: Margaret Larsen, female    DOB: 05/23/1970, 41 y.o.   MRN: [id number]  HPI Here for follow-up  Bipolar disorder Has been taking abilify for 1 week, states she will plan to taper down Depakote by mental health provider.  Notes she is fatigued since starting meds.  Last BM 4 days ago- was normal.   Not taking anything for it.  Overall mood is unchanged.  Continues to sleep little at night.  No SI, HI.  Has follow-up with MH in 1 week.    ? Urinary retention:  Has not had any urination in 4 days.  Has been drinking G2 Gatorade and fluids well.  No urinary leakage, no vagina discharge, No abdominal pain. No numbness, tingling, weakness.   DIABETES  Taking and tolerating: Lantus 60 at night, 2 units with meals.  misunderstood - at last appt was taking 4 tid with meals and meant to increase to 6, not 6 total. Fasting blood sugars: lost her meter. think before that 130-150's. Hypoglycemic symptoms: no  Diabetic Labs:  Lab Results  Component Value Date   HGBA1C 8.2 05/28/2011   HGBA1C 7.8 02/04/2011   HGBA1C 7.4 10/18/2010   Lab Results  Component Value Date   LDLCALC 109* 03/15/2010   CREATININE 0.69 02/04/2011   Last microalbumin: No results found for this basename: MICROALBUR, [id number]      Review of Systems See HPI    Objective:   Physical Exam GEN: Alert & Oriented, No acute distress CV:  Regular Rate & Rhythm, no murmur Respiratory:  Normal work of breathing, CTAB Abd:  + BS, soft, no tenderness to palpation Ext: no pre-tibial edema Neuro:  strength LE 5/5.  Sensation normal for light touch.  Patellar reflexes 2+ bilaterally.       Assessment & Plan:

## 2011-07-01 ENCOUNTER — Ambulatory Visit: Payer: Self-pay | Admitting: Family Medicine

## 2011-07-04 ENCOUNTER — Telehealth: Payer: Self-pay | Admitting: Family Medicine

## 2011-07-04 NOTE — Telephone Encounter (Signed)
Patient is calling to leave a message for Dr. Earnest Bailey about the [facility] contacting her about the Depakote but she isn't clear about what she needs to do.  She doesn't know if they are going to prescribe this medicine and she doesn't know if she is supposed to stop taking this medication.  She doesn't have an appt at Mental Health until 7/15.

## 2011-07-04 NOTE — Telephone Encounter (Signed)
Please tell patient that I am not clear what she needs to do because her psychiatric medicines are being managed by Mental health.  Please ask her to call her mental health provider to discuss what medicines she is supposed to be taking

## 2011-07-08 ENCOUNTER — Ambulatory Visit (INDEPENDENT_AMBULATORY_CARE_PROVIDER_SITE_OTHER): Payer: Self-pay | Admitting: Family Medicine

## 2011-07-08 ENCOUNTER — Encounter: Payer: Self-pay | Admitting: Family Medicine

## 2011-07-08 VITALS — BP 126/84 | HR 87 | Ht 59.0 in | Wt 221.0 lb

## 2011-07-08 DIAGNOSIS — E118 Type 2 diabetes mellitus with unspecified complications: Secondary | ICD-10-CM

## 2011-07-08 DIAGNOSIS — H409 Unspecified glaucoma: Secondary | ICD-10-CM

## 2011-07-08 DIAGNOSIS — F319 Bipolar disorder, unspecified: Secondary | ICD-10-CM

## 2011-07-08 MED ORDER — DIVALPROEX SODIUM ER 500 MG PO TB24: 1000.0000 mg | ORAL_TABLET | Freq: Every day | ORAL | Status: DC

## 2011-07-08 NOTE — Patient Instructions (Addendum)
I have refilled your Depakote.  Please speak with your mental health provider and ask them to prescribe all of your mental health medicines ( including Depakote) if they want you to be on it.  Keep glucose log and follow-up in one month

## 2011-07-08 NOTE — Progress Notes (Signed)
  Subjective:    Patient ID: Margaret Larsen, female    DOB: 1970-07-29, 41 y.o.   MRN: [id number]  HPI Here today for follow-up dm and decrease in urinary frequency  DIABETES  Taking and tolerating: yes Lantus 60 QPM, Novolog 4 with breakfast, 4 with  lunch, 4 with dinner, was also taking 4 units at bedtime. Fasting blood sugars: Only brought in 2 days of log: fasting 116 and 168, before lunch 175 and 163, before dinner 191 and 109, after dinner 166 and 257 Hypoglycemic symptoms: no Visual problems: no Monitoring feet: yes Numbness/Tingling: no Last eye exam:2013  Diabetic Labs:  Lab Results  Component Value Date   HGBA1C 8.2 05/28/2011   HGBA1C 7.8 02/04/2011   HGBA1C 7.4 10/18/2010   Lab Results  Component Value Date   LDLCALC 109* 03/15/2010   CREATININE 0.69 02/04/2011   Last microalbumin: No results found for this basename: MICROALBUR, [id number]   Decrease in urinary frequency: resolved per patient.   Review of Systems     Objective:   Physical Exam  GEn: NAD      Assessment & Plan:

## 2011-07-08 NOTE — Assessment & Plan Note (Signed)
Diabetes difficult to manage due to erratic eating. Will continue Lantus 60 QHS and increase novolog from 4 to 5 units TID.  No hypoglycemia, two day log suggests better glucose control than last a1c.  Gave patient a glucose log, will bring in follow-up in 1 month.

## 2011-07-08 NOTE — Assessment & Plan Note (Signed)
Gave rx for depakote 1000 mg daily, asked her to discuss with her mental health provider to continue prescribing this medication in the future so we can decrease miscommunications and only have one provider in charge of mental health medications.

## 2011-07-17 ENCOUNTER — Telehealth: Payer: Self-pay | Admitting: *Deleted

## 2011-07-17 NOTE — Telephone Encounter (Signed)
Received call from [name] from [facility] wanting to confirm decreased dose of depakote.  Her concern is that  Mental Health had her on Abilify and  Depokate in the past and she wants to make sure Dr. Earnest Bailey is decreasing dose and this is the  plan with Mental Health. Previously she had been on Depakote ER  500  three tabs once daily. [name] # [id number]. She just wants to confirm dose.

## 2011-07-18 NOTE — Telephone Encounter (Signed)
Left message for Margaret Larsen to call back and discuss with me.

## 2011-07-18 NOTE — Telephone Encounter (Signed)
Dr. Earnest Bailey spoke with Margaret Larsen  . [name]  then called back stating she called [facility]armacy and patient has a rx there for depakote that was filled three days ago. Therefore she will cancel the RX given by Dr. Earnest Bailey . She has notified patient.

## 2011-07-25 ENCOUNTER — Emergency Department (HOSPITAL_COMMUNITY)
Admission: EM | Admit: 2011-07-25 | Discharge: 2011-07-26 | Disposition: A | Discharge status: Home / Self Care | Payer: Medicaid Other | Attending: Emergency Medicine | Admitting: Emergency Medicine

## 2011-07-25 ENCOUNTER — Encounter (HOSPITAL_COMMUNITY): Payer: Self-pay | Admitting: *Deleted

## 2011-07-25 DIAGNOSIS — E119 Type 2 diabetes mellitus without complications: Secondary | ICD-10-CM | POA: Insufficient documentation

## 2011-07-25 DIAGNOSIS — Z794 Long term (current) use of insulin: Secondary | ICD-10-CM | POA: Insufficient documentation

## 2011-07-25 DIAGNOSIS — I10 Essential (primary) hypertension: Secondary | ICD-10-CM | POA: Insufficient documentation

## 2011-07-25 DIAGNOSIS — L272 Dermatitis due to ingested food: Secondary | ICD-10-CM | POA: Insufficient documentation

## 2011-07-25 DIAGNOSIS — Z79899 Other long term (current) drug therapy: Secondary | ICD-10-CM | POA: Insufficient documentation

## 2011-07-25 DIAGNOSIS — T7840XA Allergy, unspecified, initial encounter: Secondary | ICD-10-CM

## 2011-07-25 DIAGNOSIS — R0602 Shortness of breath: Secondary | ICD-10-CM | POA: Insufficient documentation

## 2011-07-25 MED ORDER — PREDNISONE 20 MG PO TABS
60.0000 mg | ORAL_TABLET | Freq: Once | ORAL | Status: AC
  Administered 2011-07-25: 60 mg via ORAL
  Filled 2011-07-25: qty 3

## 2011-07-25 MED ORDER — FAMOTIDINE 20 MG PO TABS
40.0000 mg | ORAL_TABLET | Freq: Once | ORAL | Status: AC
  Administered 2011-07-25: 40 mg via ORAL
  Filled 2011-07-25: qty 2

## 2011-07-25 NOTE — ED Notes (Signed)
Patient is here with c/o allergic reaction.  States that she was eating fish at a restaurant and now she feels like her throat is swelling.  Respiratory intact.

## 2011-07-25 NOTE — ED Provider Notes (Signed)
History     CSN: [id number]  Arrival date & time 07/25/11  2025   First MD Initiated Contact with Patient 07/25/11 2311      Chief Complaint  Patient presents with  . 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  She was eating at a seafood restaurant, where she ordered fish, after the first bite of fish.  She noticed, that she had some throat tightening.  She did not eat any more.  She did not take any antihistamines.  She did not use her EpiPen her symptoms have not changed in the last 5 hours, but she did not want to go to bed tonight feeling.  The same way.  She has had mild.  Allergic reactions to seafood in the past, never requiring hospitalization or intubation  Patient is a 41 y.o. female presenting with allergic reaction. The history is provided by the patient.  Allergic Reaction The primary symptoms are  shortness of breath. The primary symptoms do not include wheezing, cough, nausea, diarrhea, dizziness, angioedema or urticaria. The current episode started 3 to 5 hours ago. The problem has not changed since onset. The onset of the reaction was associated with eating. Significant symptoms that are not present include rhinorrhea.    Past Medical History  Diagnosis Date  . Diabetes mellitus   . Renal disorder   . Kidney stones   . Hypertension   . Kidney stones     Past Surgical History  Procedure Date  . Cholecystectomy     Family History  Problem Relation Age of Onset  . Hypertension Mother   . Diabetes Mother   . Cancer Mother     History  Substance Use Topics  . Smoking status: Never Smoker   . Smokeless tobacco: Never Used  . Alcohol Use: No    OB History    Grav Para Term Preterm Abortions TAB SAB Ect Mult Living                  Review of Systems  Constitutional: Negative for fever and chills.  HENT: Negative for  congestion, rhinorrhea, trouble swallowing and voice change.   Respiratory: Positive for shortness of breath. Negative for cough and wheezing.   Gastrointestinal: Negative for nausea and diarrhea.  Neurological: Negative for dizziness.    Allergies  Codeine; Diphenhydramine hcl; Fexofenadine-pseudoephed er; and Peanut-containing drug products  Home Medications   Current Outpatient Rx  Name Route Sig Dispense Refill  . ARIPIPRAZOLE 15 MG PO TABS Oral Take 15 mg by mouth daily.    . ASPIRIN 81 MG PO TBEC Oral Take 81 mg by mouth daily.     . ATORVASTATIN CALCIUM 20 MG PO TABS Oral Take 20 mg by mouth daily.    . IBUPROFEN 800 MG PO TABS Oral Take 800 mg by mouth every 8 (eight) hours as needed. for pain    . INSULIN ASPART 100 UNIT/ML [place] SOLN Subcutaneous Inject 6 Units into the skin 3 (three) times daily before meals.    . INSULIN GLARGINE 100 UNIT/ML [place] SOLN Subcutaneous Inject 60 Units into the skin at bedtime.    . MOMETASONE FUROATE 50 MCG/ACT NA SUSP Nasal Place 2 sprays into the nose daily. 17 g 12  . MONTELUKAST SODIUM 10 MG PO TABS Oral Take 1 tablet (10 mg total) by mouth at bedtime. 90 tablet 0  . QUINAPRIL-HYDROCHLOROTHIAZIDE 10-12.5 MG PO TABS Oral Take 1 tablet by  mouth daily. 30 tablet 11  . EPINEPHRINE 0.3 MG/0.3ML IJ DEVI Intramuscular Inject 0.3 mg into the muscle as needed. For allergic reaction    . PREDNISONE 20 MG PO TABS Oral Take 2 tablets (40 mg total) by mouth daily. 10 tablet 0    BP 140/96  Pulse 84  Temp 97.2 F (36.2 C) (Oral)  Resp 18  SpO2 96%  Physical Exam  Constitutional: She is oriented to person, place, and time. She appears well-developed and well-nourished.  HENT:  Head: Normocephalic.  Eyes: Pupils are equal, round, and reactive to light.  Neck: Normal range of motion.  Cardiovascular: Normal rate.   Pulmonary/Chest: Effort normal.  Musculoskeletal: Normal range of motion.  Neurological: She is alert and oriented to person, place, and  time.  Skin: Skin is warm. No rash noted. No erythema.    ED Course  Procedures (including critical care time)  Labs Reviewed - No data to display No results found.   1. Allergic reaction       MDM   We'll treat this patient with by mouth Pepcid, and prednisone.  Due to your allergy to diphenhydramine, and observe patient for short period of time, for her allergic reaction to seafood  Patient states she is swallowing easier and feels safe at home       Arman Filter, NP 07/26/11 0036  Arman Filter, NP 07/26/11 0036

## 2011-07-26 MED ORDER — PREDNISONE 20 MG PO TABS: 40.0000 mg | ORAL_TABLET | Freq: Every day | ORAL | Status: AC

## 2011-07-27 NOTE — ED Provider Notes (Signed)
Medical screening examination/treatment/procedure(s) were performed by non-physician practitioner and as supervising physician I was immediately available for consultation/collaboration.  Quinlan Mcfall R. Laniece Hornbaker, MD 07/27/11 0704 

## 2011-08-05 ENCOUNTER — Ambulatory Visit (INDEPENDENT_AMBULATORY_CARE_PROVIDER_SITE_OTHER): Payer: Medicaid Other | Admitting: Family Medicine

## 2011-08-05 ENCOUNTER — Encounter: Payer: Self-pay | Admitting: Family Medicine

## 2011-08-05 VITALS — BP 149/97 | HR 91 | Temp 98.1°F | Ht 59.0 in | Wt 216.0 lb

## 2011-08-05 DIAGNOSIS — R0609 Other forms of dyspnea: Secondary | ICD-10-CM

## 2011-08-05 DIAGNOSIS — R0989 Other specified symptoms and signs involving the circulatory and respiratory systems: Secondary | ICD-10-CM

## 2011-08-05 DIAGNOSIS — E118 Type 2 diabetes mellitus with unspecified complications: Secondary | ICD-10-CM

## 2011-08-05 DIAGNOSIS — Z91010 Allergy to peanuts: Secondary | ICD-10-CM

## 2011-08-05 DIAGNOSIS — R0600 Dyspnea, unspecified: Secondary | ICD-10-CM

## 2011-08-05 DIAGNOSIS — T781XXA Other adverse food reactions, not elsewhere classified, initial encounter: Secondary | ICD-10-CM

## 2011-08-05 MED ORDER — ALBUTEROL SULFATE (2.5 MG/3ML) 0.083% IN NEBU
2.5000 mg | INHALATION_SOLUTION | Freq: Once | RESPIRATORY_TRACT | Status: AC
  Administered 2011-08-05: 2.5 mg via RESPIRATORY_TRACT

## 2011-08-05 MED ORDER — AZITHROMYCIN 250 MG PO TABS: ORAL_TABLET | ORAL | Status: AC

## 2011-08-05 MED ORDER — ALBUTEROL SULFATE HFA 108 (90 BASE) MCG/ACT IN AERS: 2.0000 | INHALATION_SPRAY | Freq: Four times a day (QID) | RESPIRATORY_TRACT | Status: DC | PRN

## 2011-08-05 MED ORDER — EPINEPHRINE 0.3 MG/0.3ML IJ DEVI: 0.3000 mg | INTRAMUSCULAR | Status: DC | PRN

## 2011-08-05 NOTE — Assessment & Plan Note (Signed)
Multiple food allergies, triggers not always known.  I refilled epi pen, will make referral to allergy to discuss possible testing to help her be aware and manage multiple food allergies.

## 2011-08-05 NOTE — Assessment & Plan Note (Addendum)
With cough, sputum, rhinorrhea, suggest bronchitis.  Will add azithryomcyin,+ empiric albuterol.  No history of asthma or smoking, if persists, would consider PFT.      Is finishing course of prednisone.  Also in differential is vocal cord edema causing cough, hoarseness in setting of recent food allergen.  If not improving, would send to ENT for further evaluation.

## 2011-08-05 NOTE — Assessment & Plan Note (Addendum)
Reports CBG;s in good range.  Will follow-up with a1c in 3-4 weeks

## 2011-08-05 NOTE — Progress Notes (Signed)
  Subjective:    Patient ID: Margaret Larsen, female    DOB: 03/07/1970, 41 y.o.   MRN: [id number]  HPI Several weeks of dyspnea.  Was seen in [facility] 9 days ago, dx with allergic reaction to food, was prescribed 10 day course of prednisone which she is still on. Several days alter had hoarseness.  Continues to have productive cough, rhinorrhea, dyspnea.  No fever, chills, nausea, chest pain.  DM:  Taking lantus 60 units qhs and novolog 6 units TID with meals.   States fasting cbg's 116-120, lunch 116-120  Dinner 110's.    I have reviewed patient's  PMH, FH, and Social history and Medications as related to this visit. Has history of food allergy to peanuts.  Never smoker.   Review of Systems See HPI    Objective:   Physical Exam GEN: Alert & Oriented, No acute distress HEENT: [place]/AT. EOMI, PERRLA, no conjunctival injection or scleral icterus.  Bilateral tympanic membranes intact without erythema or effusion.  .  Nares without edema or rhinorrhea.  Oropharynx is without erythema or exudates.  No anterior or posterior cervical lymphadenopathy. CV:  Regular Rate & Rhythm, no murmur Respiratory:  Normal work of breathing, CTAB Abd:  + BS, soft, no tenderness to palpation Ext: no pre-tibial edema  States feels better with improved dyspnea after albuterol neb       Assessment & Plan:

## 2011-08-05 NOTE — Patient Instructions (Addendum)
Azithromycin for bronchtis  I refilled you epi pen- carry with you at all times  Will schedule you to see an allergist  If you have worsening symptoms, get medical care  Follow-up in 3-4 weeks

## 2011-08-07 ENCOUNTER — Telehealth: Payer: Self-pay | Admitting: Family Medicine

## 2011-08-07 NOTE — Telephone Encounter (Signed)
Used soap in private area and is now itching and wants to know what to do for it.

## 2011-08-07 NOTE — Telephone Encounter (Signed)
Patient states she has had itching for 3-4 days.  No discharge. Advised she try Monistat OTC or we can schedule appointment to be seen.  She will try monistat first and call back if not improving.

## 2011-08-08 ENCOUNTER — Ambulatory Visit (INDEPENDENT_AMBULATORY_CARE_PROVIDER_SITE_OTHER): Payer: Medicaid Other | Admitting: Family Medicine

## 2011-08-08 ENCOUNTER — Telehealth: Payer: Self-pay | Admitting: Family Medicine

## 2011-08-08 ENCOUNTER — Encounter: Payer: Self-pay | Admitting: Family Medicine

## 2011-08-08 VITALS — BP 118/84 | HR 101 | Temp 98.1°F | Ht 59.0 in | Wt 215.0 lb

## 2011-08-08 DIAGNOSIS — N760 Acute vaginitis: Secondary | ICD-10-CM

## 2011-08-08 LAB — POCT WET PREP (WET MOUNT): Clue Cells Wet Prep Whiff POC: POSITIVE

## 2011-08-08 MED ORDER — METRONIDAZOLE 500 MG PO TABS: 500.0000 mg | ORAL_TABLET | Freq: Two times a day (BID) | ORAL | Status: AC

## 2011-08-08 MED ORDER — FLUCONAZOLE 150 MG PO TABS: 150.0000 mg | ORAL_TABLET | Freq: Once | ORAL | Status: AC

## 2011-08-08 NOTE — Telephone Encounter (Signed)
Called Patient Re: Wet prep showed BV, not yeast.  She has already taken the fluconazole, I advised this was not harmful, but I am sending an Rx to her pharmacy for metronidazole, to clear the BV.  Explained what BV is.  She voices understanding.

## 2011-08-08 NOTE — Patient Instructions (Signed)
I think you do have a yeast infection based on your exam, but we will check under the microscope and let you know.  I have sent a prescription for a yeast medication.  I think this is most likely from your antibiotics, and if you are feeling better from your bronchitis, you can stop taking them.    Please come back and see [place] if you are not better.

## 2011-08-08 NOTE — Assessment & Plan Note (Signed)
History and exam consistent with yeast infection, will treat empirically, follow up wet prep.  Advised this is more likely from antibiotics than abilify, and that since she is feeling better she can stop the antibiotics.

## 2011-08-08 NOTE — Progress Notes (Signed)
  Subjective:    Patient ID: Margaret Larsen, female    DOB: 03/29/1970, 41 y.o.   MRN: [id number]  HPI  Margaret Larsen comes in for vaginal itching x 3 days.  She was diagnosed with bronchitis last week and is taking a Z-pack.  She has had a yeast infection in the past and is pretty sure that is what is causing the itching.  She is also concerned because she recently started abilify for her bipolar disorder, and she read that can cause yeast infections too.  She denies any history of STD contact or urinary symptoms.   Review of Systems See HPI    Objective:   Physical Exam BP 118/84  Pulse 101  Temp 98.1 F (36.7 C) (Oral)  Ht 4\' 11"  (1.499 m)  Wt 215 lb (97.523 kg)  BMI 43.42 kg/m2 General appearance: alert, cooperative and no distress Head: Normocephalic, without obvious abnormality, atraumatic GYN: External genitalia normal, vaginal vault has thick white discharge, cervix difficult to visualize with speculum due to obesity.        Assessment & Plan:

## 2011-08-09 ENCOUNTER — Telehealth: Payer: Self-pay | Admitting: Family Medicine

## 2011-08-09 NOTE — Telephone Encounter (Signed)
Patient calling because she has felt slightly more tired over last 2 days. Her blood sugar today was 420. She reports taking insulin as prescribed. Did take diflucan for yeast infection and is currently taking metronidazole. Only complaint is slightly more tired. No polyuria, dysuria, cough.   Asked patient to record CBGs at least 4x daily over weekend and to follow up with Dr. Earnest Bailey early next week. Possible infection causing higher CBGs as patient reports typically 200 or less. If patient develops above symptoms or worsening of fatigue, told her she could be seen at urgent care over weekend.

## 2011-08-12 ENCOUNTER — Ambulatory Visit (INDEPENDENT_AMBULATORY_CARE_PROVIDER_SITE_OTHER): Payer: Medicaid Other | Admitting: Family Medicine

## 2011-08-12 VITALS — BP 134/85 | HR 73 | Ht 59.0 in | Wt 217.0 lb

## 2011-08-12 DIAGNOSIS — E118 Type 2 diabetes mellitus with unspecified complications: Secondary | ICD-10-CM

## 2011-08-12 NOTE — Patient Instructions (Addendum)
Will need to split lantus into 2 shots  Start with 35 units in the morning and 30 units in the evening  If blood sugars first thing in morning still above 200, go up to 35 units in morning, 35 units in evening  Take novolog 8 units with each meal  Write down blood sugars, bring log into office and I will call you with further adjustments

## 2011-08-12 NOTE — Progress Notes (Signed)
  Subjective:    Patient ID: Margaret Larsen, female    DOB: [birth year], 41 y.o.   MRN: [id number]  HPI Here for office visit for high blood sugars  Noted over the past week has had elevated BS.  Notes no fever, chills, coughing, or other signs of illness.  States fasting cbg's are usually 100's, recently has been in 200-300's.  Brings in meter- shows several sporadic values on it, all in 300's.  Of note, several weeks ago was on short course of steroids for food allergy, has been started on abilify by mental health.  DIABETES  Taking and tolerating: yes Fasting blood sugars: 300's  Hypoglycemic symptoms: no Last eye exam: 3 month ago. Diabetic Labs:  Lab Results  Component Value Date   HGBA1C 8.2 05/28/2011   HGBA1C 7.8 02/04/2011   HGBA1C 7.4 10/18/2010   Lab Results  Component Value Date   LDLCALC 109* 03/15/2010   CREATININE 0.69 02/04/2011   Last microalbumin: No results found for this basename: MICROALBUR, [id number]       Review of Systems     Objective:   Physical Exam        Assessment & Plan:

## 2011-08-12 NOTE — Assessment & Plan Note (Signed)
Worsening CBG's most likley due to new start of abilify.  No new signd of infection or changes in lifestyle.  Was on lantus 60, will split - start at 35 qam, 30, qpm, and will titrate up to 35 bid.  Will also increase novolog from 6 units to 8 units TID.  Gave patient glucose log, will check tid and QHS, bring back to me in 1 week for me to call with further steps.  I also gave her accucheck nano meter rx.

## 2011-08-30 ENCOUNTER — Encounter: Payer: Medicaid Other | Admitting: Family Medicine

## 2011-09-02 NOTE — Progress Notes (Signed)
This encounter was created in error - please disregard.

## 2011-09-06 ENCOUNTER — Ambulatory Visit: Payer: Medicaid Other | Admitting: Family Medicine

## 2011-10-11 ENCOUNTER — Emergency Department (HOSPITAL_COMMUNITY)
Admission: EM | Admit: 2011-10-11 | Discharge: 2011-10-11 | Disposition: A | Discharge status: Nursing Home (Includes ICF) | Payer: Self-pay | Source: Home / Self Care | Attending: Family Medicine | Admitting: Family Medicine

## 2011-10-11 ENCOUNTER — Encounter (HOSPITAL_COMMUNITY): Payer: Self-pay | Admitting: *Deleted

## 2011-10-11 ENCOUNTER — Emergency Department (HOSPITAL_COMMUNITY)
Admission: EM | Admit: 2011-10-11 | Discharge: 2011-10-11 | Discharge status: Against Medical Advice | Payer: Medicaid Other | Attending: Emergency Medicine | Admitting: Emergency Medicine

## 2011-10-11 ENCOUNTER — Telehealth: Payer: Self-pay | Admitting: Family Medicine

## 2011-10-11 DIAGNOSIS — R7309 Other abnormal glucose: Secondary | ICD-10-CM

## 2011-10-11 DIAGNOSIS — R5381 Other malaise: Secondary | ICD-10-CM | POA: Insufficient documentation

## 2011-10-11 DIAGNOSIS — H538 Other visual disturbances: Secondary | ICD-10-CM | POA: Insufficient documentation

## 2011-10-11 DIAGNOSIS — R5383 Other fatigue: Secondary | ICD-10-CM | POA: Insufficient documentation

## 2011-10-11 DIAGNOSIS — R739 Hyperglycemia, unspecified: Secondary | ICD-10-CM

## 2011-10-11 LAB — CBC WITH DIFFERENTIAL/PLATELET
Basophils Absolute: 0 10*3/uL (ref 0.0–0.1)
Lymphocytes Relative: 52 % — ABNORMAL HIGH (ref 12–46)
Lymphs Abs: 4.2 10*3/uL — ABNORMAL HIGH (ref 0.7–4.0)
Neutro Abs: 3.4 10*3/uL (ref 1.7–7.7)
Neutrophils Relative %: 42 % — ABNORMAL LOW (ref 43–77)
Platelets: 363 10*3/uL (ref 150–400)
RBC: 5.27 MIL/uL — ABNORMAL HIGH (ref 3.87–5.11)
RDW: 13.1 % (ref 11.5–15.5)
WBC: 8.2 10*3/uL (ref 4.0–10.5)

## 2011-10-11 LAB — URINALYSIS, ROUTINE W REFLEX MICROSCOPIC
Glucose, UA: >1000 mg/dL — AB
Leukocytes, UA: NEGATIVE
Nitrite: NEGATIVE
Protein, ur: NEGATIVE mg/dL
Urobilinogen, UA: 0.2 mg/dL (ref 0.0–1.0)

## 2011-10-11 LAB — POCT I-STAT, CHEM 8
Creatinine, Ser: 0.9 mg/dL (ref 0.50–1.10)
Hemoglobin: 16 g/dL — ABNORMAL HIGH (ref 12.0–15.0)
Potassium: 3.6 mEq/L (ref 3.5–5.1)
Sodium: 134 mEq/L — ABNORMAL LOW (ref 135–145)

## 2011-10-11 LAB — BASIC METABOLIC PANEL
CO2: 25 mEq/L (ref 19–32)
Calcium: 9.6 mg/dL (ref 8.4–10.5)
GFR calc Af Amer: >90 mL/min (ref >90)
GFR calc non Af Amer: >90 mL/min (ref >90)
Sodium: 131 mEq/L — ABNORMAL LOW (ref 135–145)

## 2011-10-11 LAB — URINE MICROSCOPIC-ADD ON

## 2011-10-11 LAB — GLUCOSE, CAPILLARY: Glucose-Capillary: 382 mg/dL — ABNORMAL HIGH (ref 70–99)

## 2011-10-11 NOTE — ED Notes (Signed)
C/o voiding frequently, feeling sleepy x 1 week and blurred vision today. States she was seeing flashing lights yesterday.  States her sugars have been in the 500's today and 400's in the this past week.  She called the [facility] and they told her to come here or go to the ED.

## 2011-10-11 NOTE — ED Notes (Addendum)
C/o tired and increased voiding. Sx onset 1 week ago, sx worse tonight. Reports: "vision got blurry while driving". cbg >500 at home. Seen at [facility] and cbg was >400. cbg 382 in triage at this time. Has taken lantus and novolog today. Denies any s/sx of infection or illness. Alert, NAD, calm, interactive. (Denies: nausea, dizziness, sweating or other sx).

## 2011-10-11 NOTE — ED Notes (Signed)
Report called to Nurse First Alcario Drought.  Pt. to transfer to ED by shuttle per NP.

## 2011-10-11 NOTE — ED Notes (Signed)
Pts BS checked in Triage and same noted to be 382 mg/dL

## 2011-10-11 NOTE — ED Notes (Signed)
Pt left AMA. Was not seen by EDP left from triage waiting room.

## 2011-10-11 NOTE — ED Provider Notes (Signed)
History     CSN: [id number]  Arrival date & time 10/11/11  1859   None     Chief Complaint  Patient presents with  . Hyperglycemia    (Consider location/radiation/quality/duration/timing/severity/associated sxs/prior treatment) The history is provided by the patient.  known history of diabetes, seen at primary care provider office where her lantus was increased in dosage by 5 units last week.  States over the past week she has been feeling fatigued with increased urination.  Today she checked her blood sugar and it was 510 at home.  Denies chest pain, abdominal pain, n/v/d.  No other urinary symptoms.    Past Medical History  Diagnosis Date  . Diabetes mellitus   . Renal disorder   . Kidney stones   . Hypertension   . Kidney stones     Past Surgical History  Procedure Date  . Cholecystectomy     Family History  Problem Relation Age of Onset  . Hypertension Mother   . Diabetes Mother   . Cancer Mother     History  Substance Use Topics  . Smoking status: Never Smoker   . Smokeless tobacco: Never Used  . Alcohol Use: No    OB History    Grav Para Term Preterm Abortions TAB SAB Ect Mult Living                  Review of Systems  All other systems reviewed and are negative.    Allergies  Codeine; Diphenhydramine hcl; Fexofenadine-pseudoephed er; and Peanut-containing drug products  Home Medications   Current Outpatient Rx  Name Route Sig Dispense Refill  . ALBUTEROL SULFATE HFA 108 (90 BASE) MCG/ACT IN AERS Inhalation Inhale 2 puffs into the lungs every 6 (six) hours as needed for wheezing. 1 Inhaler 0  . ARIPIPRAZOLE 15 MG PO TABS Oral Take 15 mg by mouth daily.    . ATORVASTATIN CALCIUM 20 MG PO TABS Oral Take 20 mg by mouth daily.    . IBUPROFEN 800 MG PO TABS Oral Take 800 mg by mouth every 8 (eight) hours as needed. for pain    . INSULIN ASPART 100 UNIT/ML [place] SOLN Subcutaneous Inject 8 Units into the skin 3 (three) times daily before meals.     .  INSULIN GLARGINE 100 UNIT/ML [place] SOLN Subcutaneous Inject 60 Units into the skin 2 (two) times daily. 35  Units bid    . MOMETASONE FUROATE 50 MCG/ACT NA SUSP Nasal Place 2 sprays into the nose daily. 17 g 12  . MONTELUKAST SODIUM 10 MG PO TABS Oral Take 1 tablet (10 mg total) by mouth at bedtime. 90 tablet 0  . ASPIRIN 81 MG PO TBEC Oral Take 81 mg by mouth daily.     Marland Kitchen EPINEPHRINE 0.3 MG/0.3ML IJ DEVI Intramuscular Inject 0.3 mLs (0.3 mg total) into the muscle as needed. For allergic reaction 1 Device 0  . QUINAPRIL-HYDROCHLOROTHIAZIDE 10-12.5 MG PO TABS Oral Take 1 tablet by mouth daily. 30 tablet 11    BP 123/80  Pulse 86  Temp 98 F (36.7 C) (Oral)  Resp 18  SpO2 98%  Physical Exam  Nursing note and vitals reviewed. Constitutional: She is oriented to person, place, and time. Vital signs are normal. She appears well-developed and well-nourished. She is active and cooperative.  HENT:  Head: Normocephalic.  Eyes: Conjunctivae normal are normal. Pupils are equal, round, and reactive to light. No scleral icterus.  Neck: Trachea normal. Neck supple.  Cardiovascular: Normal rate, regular  rhythm, normal heart sounds and intact distal pulses.   Pulmonary/Chest: Effort normal and breath sounds normal.  Abdominal: Soft. Bowel sounds are normal. There is no tenderness. There is no rebound.  Neurological: She is alert and oriented to person, place, and time. No cranial nerve deficit or sensory deficit.  Skin: Skin is warm and dry.  Psychiatric: She has a normal mood and affect. Her speech is normal and behavior is normal. Judgment and thought content normal. Cognition and memory are normal.    ED Course  Procedures (including critical care time)  Labs Reviewed  POCT I-STAT, CHEM 8 - Abnormal; Notable for the following:    Sodium 134 (*)     Chloride 95 (*)     Glucose, Bld 461 (*)     Hemoglobin 16.0 (*)     HCT 47.0 (*)     All other components within normal limits   No results  found.   1. Hyperglycemia       MDM  Transfer to [facility] for further evaluation and management of hyperglycemia.        Johnsie Kindred, NP 10/11/11 2019

## 2011-10-11 NOTE — Telephone Encounter (Signed)
EMERGENCY LINE CALL: Blood sugar is 510 and wants to know what to do.  Pt reports that it won't go down and is wondering if it's because of her depression medicine, or what.  Takes Lantus 60-65 units and novolog 6 TID. No N/V but feels like vision is blurry for the past week.  Advised pt that she can come to the ED if she is feeling poorly.  Alternatively, she could increase her novolog to 8 units.  Advised pt that she needs an appt with PCP next week since she has not been seen in 2 months. Patient verbalized understanding on the reasons to come to the ED tonight/over the weekend.

## 2011-10-12 NOTE — ED Provider Notes (Signed)
Medical screening examination/treatment/procedure(s) were performed by non-physician practitioner and as supervising physician I was immediately available for consultation/collaboration.   [facility]; MD   Sharin Grave, MD 10/12/11 [phone]

## 2011-10-14 ENCOUNTER — Encounter: Payer: Self-pay | Admitting: Family Medicine

## 2011-10-14 ENCOUNTER — Ambulatory Visit (INDEPENDENT_AMBULATORY_CARE_PROVIDER_SITE_OTHER): Payer: Medicaid Other | Admitting: Family Medicine

## 2011-10-14 VITALS — BP 142/100 | HR 84 | Ht 59.0 in | Wt 207.0 lb

## 2011-10-14 DIAGNOSIS — E118 Type 2 diabetes mellitus with unspecified complications: Secondary | ICD-10-CM

## 2011-10-14 DIAGNOSIS — E119 Type 2 diabetes mellitus without complications: Secondary | ICD-10-CM

## 2011-10-14 DIAGNOSIS — I10 Essential (primary) hypertension: Secondary | ICD-10-CM

## 2011-10-14 LAB — POCT GLYCOSYLATED HEMOGLOBIN (HGB A1C): Hemoglobin A1C: >14

## 2011-10-14 LAB — GLUCOSE, CAPILLARY: Glucose-Capillary: 410 mg/dL — ABNORMAL HIGH (ref 70–99)

## 2011-10-14 NOTE — Progress Notes (Signed)
  Subjective:    Patient ID: Margaret Larsen, female    DOB: 01/19/1970, 41 y.o.   MRN: [id number]  HPI Here to follow-up hyperglycemia  Was seen on 10/4 at urgent care for hyperglycemia.  BS was 384.  Patient was transferred to the ER, where patient left AMA.    Feels fatigued.  Reports frequent urination.  Denies fever, chills ,nausea, vomiting, abdominal pain, dysuria.  Is taking Lantus 65 units QHS and Novolog 6 units TID with meals  DIABETES  Lab Results  Component Value Date   HGBA1C >14.0 10/14/2011   HGBA1C 8.2 05/28/2011   HGBA1C 7.8 02/04/2011   Lab Results  Component Value Date   LDLCALC 109* 03/15/2010   CREATININE 0.60 10/11/2011   Last microalbumin: No results found for this basename: MICROALBUR, [id number]    HTN: no chest pain, dyspnea, edema.  Takign acei+ hctz  I have reviewed patient's  PMH, FH, and Social history and Medications as related to this visit. Sig for complicated HTn and DM   Review of Systems     Objective:   Physical Exam GEN: NAD, nontoxic apeparing        Assessment & Plan:

## 2011-10-14 NOTE — Assessment & Plan Note (Addendum)
Worsened.  elevated tdoay, in past had been well controlled.  Will recheck i 2 weeks, and increase meds if continue to be elevated.

## 2011-10-14 NOTE — Assessment & Plan Note (Addendum)
Worsened.  Marked increase in a1c over the past 6 months, now > 14 coinciding with start of abilify.  Discussed may be having an effect- may discuss with mental health provider.  Will increase lantus from 65 qhs to 40 BID.  Patient instructed to increase by 2 units if fasting glucose> 150 for 2 days.  We played out scenarios and she understood.  Will take 8 units tid of novolog.  Patient to follow-up in less than 2 weeks for recheck.

## 2011-10-14 NOTE — Patient Instructions (Addendum)
Lantus- 40 units in morning, 40 units at night Keep taking Novolog 8 units three times a day Check blood sugar before breakfast and before bedtime  If your morning and evening blood sugar is greater than 150 for 2 days, increase Lantus by 2 units in the morning and evening.  \  Follow-up in 2 week with sugar log for more adjustments  Drink lots of water

## 2011-11-01 ENCOUNTER — Ambulatory Visit: Payer: Medicaid Other | Admitting: Family Medicine

## 2011-11-22 ENCOUNTER — Ambulatory Visit: Payer: Medicaid Other | Admitting: Family Medicine

## 2011-12-19 ENCOUNTER — Ambulatory Visit (INDEPENDENT_AMBULATORY_CARE_PROVIDER_SITE_OTHER): Payer: Medicaid Other | Admitting: Family Medicine

## 2011-12-19 VITALS — BP 103/70 | HR 94 | Temp 98.2°F | Ht 59.0 in | Wt 209.0 lb

## 2011-12-19 DIAGNOSIS — J069 Acute upper respiratory infection, unspecified: Secondary | ICD-10-CM

## 2011-12-19 MED ORDER — ALBUTEROL SULFATE HFA 108 (90 BASE) MCG/ACT IN AERS: 2.0000 | INHALATION_SPRAY | Freq: Four times a day (QID) | RESPIRATORY_TRACT | Status: DC | PRN

## 2011-12-19 MED ORDER — BENZONATATE 100 MG PO CAPS: 100.0000 mg | ORAL_CAPSULE | Freq: Two times a day (BID) | ORAL | Status: DC | PRN

## 2011-12-19 NOTE — Patient Instructions (Addendum)
For the nasal congestion, you can take Afrin spray over the counter, 2-3 times per day, no more than 3 days.   Upper Respiratory Infection, Adult An upper respiratory infection (URI) is also sometimes known as the common cold. The upper respiratory tract includes the nose, sinuses, throat, trachea, and bronchi. Bronchi are the airways leading to the lungs. Most people improve within 1 week, but symptoms can last up to 2 weeks. A residual cough may last even longer.  CAUSES Many different viruses can infect the tissues lining the upper respiratory tract. The tissues become irritated and inflamed and often become very moist. Mucus production is also common. A cold is contagious. You can easily spread the virus to others by oral contact. This includes kissing, sharing a glass, coughing, or sneezing. Touching your mouth or nose and then touching a surface, which is then touched by another person, can also spread the virus. SYMPTOMS  Symptoms typically develop 1 to 3 days after you come in contact with a cold virus. Symptoms vary from person to person. They may include:  Runny nose.  Sneezing.  Nasal congestion.  Sinus irritation.  Sore throat.  Loss of voice (laryngitis).  Cough.  Fatigue.  Muscle aches.  Loss of appetite.  Headache.  Low-grade fever. DIAGNOSIS  You might diagnose your own cold based on familiar symptoms, since most people get a cold 2 to 3 times a year. Your caregiver can confirm this based on your exam. Most importantly, your caregiver can check that your symptoms are not due to another disease such as strep throat, sinusitis, pneumonia, asthma, or epiglottitis. Blood tests, throat tests, and X-rays are not necessary to diagnose a common cold, but they may sometimes be helpful in excluding other more serious diseases. Your caregiver will decide if any further tests are required. RISKS AND COMPLICATIONS  You may be at risk for a more severe case of the common cold if  you smoke cigarettes, have chronic heart disease (such as heart failure) or lung disease (such as asthma), or if you have a weakened immune system. The very young and very old are also at risk for more serious infections. Bacterial sinusitis, middle ear infections, and bacterial pneumonia can complicate the common cold. The common cold can worsen asthma and chronic obstructive pulmonary disease (COPD). Sometimes, these complications can require emergency medical care and may be life-threatening. PREVENTION  The best way to protect against getting a cold is to practice good hygiene. Avoid oral or hand contact with people with cold symptoms. Wash your hands often if contact occurs. There is no clear evidence that vitamin C, vitamin E, echinacea, or exercise reduces the chance of developing a cold. However, it is always recommended to get plenty of rest and practice good nutrition. TREATMENT  Treatment is directed at relieving symptoms. There is no cure. Antibiotics are not effective, because the infection is caused by a virus, not by bacteria. Treatment may include:  Increased fluid intake. Sports drinks offer valuable electrolytes, sugars, and fluids.  Breathing heated mist or steam (vaporizer or shower).  Eating chicken soup or other clear broths, and maintaining good nutrition.  Getting plenty of rest.  Using gargles or lozenges for comfort.  Controlling fevers with ibuprofen or acetaminophen as directed by your caregiver.  Increasing usage of your inhaler if you have asthma. Zinc gel and zinc lozenges, taken in the first 24 hours of the common cold, can shorten the duration and lessen the severity of symptoms. Pain medicines may  help with fever, muscle aches, and throat pain. A variety of non-prescription medicines are available to treat congestion and runny nose. Your caregiver can make recommendations and may suggest nasal or lung inhalers for other symptoms.  HOME CARE INSTRUCTIONS   Only  take over-the-counter or prescription medicines for pain, discomfort, or fever as directed by your caregiver.  Use a warm mist humidifier or inhale steam from a shower to increase air moisture. This may keep secretions moist and make it easier to breathe.  Drink enough water and fluids to keep your urine clear or pale yellow.  Rest as needed.  Return to work when your temperature has returned to normal or as your caregiver advises. You may need to stay home longer to avoid infecting others. You can also use a face mask and careful hand washing to prevent spread of the virus. SEEK MEDICAL CARE IF:   After the first few days, you feel you are getting worse rather than better.  You need your caregiver's advice about medicines to control symptoms.  You develop chills, worsening shortness of breath, or brown or red sputum. These may be signs of pneumonia.  You develop yellow or brown nasal discharge or pain in the face, especially when you bend forward. These may be signs of sinusitis.  You develop a fever, swollen neck glands, pain with swallowing, or white areas in the back of your throat. These may be signs of strep throat. SEEK IMMEDIATE MEDICAL CARE IF:   You have a fever.  You develop severe or persistent headache, ear pain, sinus pain, or chest pain.  You develop wheezing, a prolonged cough, cough up blood, or have a change in your usual mucus (if you have chronic lung disease).  You develop sore muscles or a stiff neck. Document Released: 06/19/2000 Document Revised: 03/18/2011 Document Reviewed: 04/27/2010 [facility] Patient Information 2013 [place], [place].

## 2011-12-21 DIAGNOSIS — J069 Acute upper respiratory infection, unspecified: Secondary | ICD-10-CM | POA: Insufficient documentation

## 2011-12-21 NOTE — Progress Notes (Signed)
Patient ID: Margaret Larsen    DOB: 12-24-1970, 41 y.o.   MRN: [id number] --- Subjective:  Margaret Larsen is a 41 y.o.female who presents with cough and congestion since x4days. At the beginning of illness course, she reports episode of emess x1 and feeling hot and cold. Has not had emesis or nausea since. Cough is worst at night. Not associated with shortness of breath. She has tried mucinex and delsym which have not helped. She hasn't checked her temperature.   ROS: see HPI Past Medical History: reviewed and updated medications and allergies. Social History: Tobacco: none  Objective: Filed Vitals:   12/19/11 1033  BP: 103/70  Pulse: 94  Temp: 98.2 F (36.8 C)    Physical Examination:   General appearance - alert, well appearing, and in no distress Ears - bilateral TM's and external ear canals normal Nose - erythema and congestion. No sinus tenderness.  Mouth - mucous membranes moist, pharynx normal without lesions Neck - supple, no significant adenopathy Chest - clear to auscultation, no wheezes, rales or rhonchi, symmetric air entry Heart - normal rate, regular rhythm, normal S1, S2, no murmurs, rubs, clicks or gallops

## 2011-12-21 NOTE — Assessment & Plan Note (Addendum)
Likely viral URI. No evidence of respiratory distress or focal findings on exam. Treat symptomatically. Cough is the primary concern of patient. Rx for tessalon pearls. Afrin for nasal congestion for no more than 3 days.

## 2012-01-13 ENCOUNTER — Ambulatory Visit (INDEPENDENT_AMBULATORY_CARE_PROVIDER_SITE_OTHER): Payer: Medicaid Other | Admitting: Family Medicine

## 2012-01-13 ENCOUNTER — Encounter: Payer: Self-pay | Admitting: Family Medicine

## 2012-01-13 VITALS — BP 128/87 | HR 86 | Temp 98.7°F | Ht 59.0 in | Wt 208.5 lb

## 2012-01-13 DIAGNOSIS — N760 Acute vaginitis: Secondary | ICD-10-CM

## 2012-01-13 LAB — POCT WET PREP (WET MOUNT): Clue Cells Wet Prep Whiff POC: NEGATIVE

## 2012-01-13 NOTE — Progress Notes (Signed)
  Subjective:    Patient ID: Margaret Larsen, female    DOB: 1970-06-30, 42 y.o.   MRN: [id number]  HPI  Symptoms: vaginal itching of skin. Onset: 4-5 days ago, symptoms getting worse. Worse when she eats or drinks sugary foods, patient is a diabetic. No recent antibiotic use or hx of douching.  Sexually active with one partner.  Denies any fever, chills, N/V, abdominal, pelvic, or back pain, or dysuria. Denies any vaginal D/C, bleeding, or spotting.  Review of Systems  Per HPI    Objective:   Physical Exam  Constitutional: No distress.  Abdominal: Soft. She exhibits no distension. There is no tenderness. There is no rebound and no guarding.       No CVA tenderness  Genitourinary: Uterus normal. There is no rash, tenderness, lesion or injury on the right labia. There is no rash, tenderness, lesion or injury on the left labia. Cervix exhibits discharge. Vaginal discharge found.      Assessment & Plan:

## 2012-01-13 NOTE — Assessment & Plan Note (Signed)
Vaginal itching could be due to yeast vs. Bacterial vaginosis (dx in 08/2011). - Will perform wet prep today and treat as necessary - Encouraged drinking plenty of water and practice good hygiene - Red flags reviewed

## 2012-01-13 NOTE — Patient Instructions (Addendum)
It was nice to meet you today, Margaret Larsen. Please refer to handout below regarding common causes of vaginitis. It typically takes about 1-2 days for results to return. If today's lab results are ABNORMAL, we will call you and send medication to your pharmacy.   If you develop worsening symptoms, fever (temperature > 101.5 degrees), nausea/vomiting, or pelvic pain, please return to clinic.  What is vaginitis? Vaginitis is an inflammation of the vagina. Vulvovaginitis refers to inflammation of both the vagina and vulva (the external female genitals).   What are the most common types of vaginitis? The six most common types of vaginitis are:  Candida or "yeast" vaginitis  Bacterial vaginosis  Trichomoniasis vaginitis (a sexually transmitted infection)   What are candida or "yeast" infections? Yeast infections of the vagina are what most women think of when they hear the term "vaginitis." Yeast infections are caused by one of the many species of fungus called candida. Candida normally live in small numbers in the vagina, as well as in the mouth and digestive tract of both men and women.  Yeast infections produce a thick, white vaginal discharge with the consistency of cottage cheese. Although the discharge can be somewhat watery, it is odorless. Yeast infections usually cause the vagina and the vulva to be very itchy and red, even before the onset of discharge.  If yeast is normal in a woman's vagina, what makes it cause an infection? Usually, infection occurs when a change in the delicate balance in a woman's system takes place.   What is bacterial vaginosis? Although "yeast" is the name most women know, bacterial vaginosis (BV) actually is the most common vaginal infection in women of reproductive age. Bacterial vaginosis often will cause a vaginal discharge. The discharge usually is thin and milky, and is described as having a "fishy" odor. This odor may become more noticeable after intercourse.    Redness or itching of the vagina are not common symptoms of bacterial vaginosis. Some women with BV have no symptoms at all, and the vaginitis is only discovered during a routine gynecologic exam. Bacterial vaginosis is caused by a combination of several bacteria. These bacteria seem to overgrow in much the same way as do candida when the vaginal pH balance is upset.  Because bacterial vaginosis is caused by bacteria and not by yeast, medicine that is appropriate for yeast is not effective against the bacteria that cause bacterial vaginosis.  BV is treated in asymptomatic females of child bearing age to decrease their risk of preterm delivery.  Risk factors for BV include:  New or multiple sexual partners  Douching  Cigarette smoking  What is trichomoniasis? Trichomoniasis - Trichomoniasis is caused by a tiny single-celled organism known as a "protozoa." When this organism infects the vagina, it can cause a frothy, greenish-yellow discharge. Often this discharge will have a foul smell. Women with trichomonal vaginitis may complain of itching and soreness of the vagina and vulva, as well as burning during urination. In addition, there can be discomfort in the lower abdomen and vaginal pain with intercourse. These symptoms may be worse after the menstrual period. Many women, however, do not develop any symptoms. It is important to understand that this type of vaginitis can be transmitted through sexual intercourse. For treatment to be effective, the sexual partner must be treated at the same time as the patient.

## 2012-01-14 ENCOUNTER — Telehealth: Payer: Self-pay | Admitting: Family Medicine

## 2012-01-14 NOTE — Telephone Encounter (Signed)
Please let patient know that wet prep was all NORMAL.  For itching she can take over the counter benadryl as needed.  She does not need any antibiotics at this time.

## 2012-01-14 NOTE — Telephone Encounter (Signed)
Called and informed pt of Dr.de la Cruz's recommendation of Zyrtec and pt stated that she was told that she could not take the Zyrtec b/c of the antihistamine would like to know what to do.Margaret Larsen

## 2012-01-14 NOTE — Telephone Encounter (Signed)
Patient can purchase over the counter generic Zyrtec for itching.  Please let her know.  Thank you.

## 2012-01-14 NOTE — Telephone Encounter (Signed)
Pt called to ask about results and message was given - she is now asking if she allergic to Benadryl, then what can she take.

## 2012-01-15 MED ORDER — CLOTRIMAZOLE 2 % VA CREA: 1.0000 | TOPICAL_CREAM | Freq: Two times a day (BID) | VAGINAL | Status: DC

## 2012-01-15 NOTE — Telephone Encounter (Signed)
Please let patient know I sent Gyne-Lotrimin cream to her pharmacy - she can also buy it OTC if it less expensive.  Thanks!

## 2012-01-15 NOTE — Telephone Encounter (Signed)
Called and informed pt .Neftali Thurow Lynetta  

## 2012-01-15 NOTE — Addendum Note (Signed)
Addended by: Tye Savoy, Ragnar Waas on: 01/15/2012 08:49 AM   Modules accepted: Orders

## 2012-03-26 ENCOUNTER — Other Ambulatory Visit: Payer: Self-pay | Admitting: Family Medicine

## 2012-03-26 MED ORDER — INSULIN ASPART 100 UNIT/ML ~~LOC~~ SOLN: 8.0000 [IU] | Freq: Three times a day (TID) | SUBCUTANEOUS | Status: DC

## 2012-04-01 ENCOUNTER — Telehealth: Payer: Self-pay | Admitting: *Deleted

## 2012-04-01 NOTE — Telephone Encounter (Signed)
Yes, 8 units is correct.  Yes, refill was for 1 year.  Can change to 11 refills.  Yes, patient needs to make appointment as she is has not been seen in over 4 months.

## 2012-04-01 NOTE — Telephone Encounter (Signed)
Dawn from [facility] pharmacy MAP   left message on Physicians/Pharmacy line wanting to verify Novolog Flexpen dosage of 8 units three times daily . Also they had gotten message that patient needs appointment and  MD sent in RX for Novolog with 12 refills. Is this correct?Marland Kitchen

## 2012-04-01 NOTE — Telephone Encounter (Signed)
Called and notified [facility] pharmacy.  Also called patient to advise of need for appointment.  Unable to schedule on Dr. Leonie Green schedule now so asked patient to call back to speak with scheduler to schedule. She voices understanding.

## 2012-04-07 ENCOUNTER — Encounter: Payer: Self-pay | Admitting: Family Medicine

## 2012-04-07 ENCOUNTER — Ambulatory Visit (INDEPENDENT_AMBULATORY_CARE_PROVIDER_SITE_OTHER): Payer: Medicaid Other | Admitting: Family Medicine

## 2012-04-07 VITALS — BP 113/79 | HR 93 | Temp 98.2°F | Ht 59.0 in | Wt 209.0 lb

## 2012-04-07 DIAGNOSIS — E119 Type 2 diabetes mellitus without complications: Secondary | ICD-10-CM

## 2012-04-07 DIAGNOSIS — F319 Bipolar disorder, unspecified: Secondary | ICD-10-CM

## 2012-04-07 DIAGNOSIS — E118 Type 2 diabetes mellitus with unspecified complications: Secondary | ICD-10-CM

## 2012-04-07 DIAGNOSIS — E785 Hyperlipidemia, unspecified: Secondary | ICD-10-CM

## 2012-04-07 DIAGNOSIS — I10 Essential (primary) hypertension: Secondary | ICD-10-CM

## 2012-04-07 MED ORDER — IBUPROFEN 800 MG PO TABS: 800.0000 mg | ORAL_TABLET | Freq: Three times a day (TID) | ORAL | Status: DC | PRN

## 2012-04-07 MED ORDER — CETIRIZINE HCL 10 MG PO TABS: 10.0000 mg | ORAL_TABLET | Freq: Every day | ORAL | Status: DC

## 2012-04-07 MED ORDER — ALBUTEROL SULFATE HFA 108 (90 BASE) MCG/ACT IN AERS: 2.0000 | INHALATION_SPRAY | Freq: Four times a day (QID) | RESPIRATORY_TRACT | Status: DC | PRN

## 2012-04-07 NOTE — Patient Instructions (Addendum)
Your diabetes needs improvement!!! Take 8 units of novolog with each meal. Schedule appointment with pharmacy clinic to talk about diabetes, bring your glucose meter  Schedule lab appointment for fasting labs ( nothing but medicine and water for 8 hours)  Get yearly eye exam  Make follow-up with your mental health provider

## 2012-04-08 NOTE — Assessment & Plan Note (Signed)
Encouraged her to follow-up with mental health provider about restarting treatment for bipolar disorder.  Had significant trouble with her diabetes after start of abilify.

## 2012-04-08 NOTE — Progress Notes (Signed)
  Subjective:    Patient ID: Margaret Larsen, female    DOB: 1970/06/25, 42 y.o.   MRN: [id number]  HPI Here for routine follow-up  DIABETES  Taking and tolerating: yes Fasting blood sugars: high 100's fasting per patient..  No log or glucometer today. Hypoglycemic symptoms: no Visual problems: no Monitoring feet: yes Numbness/Tingling: no Last eye exam: thinks she is up to date Diabetic Labs:  Lab Results  Component Value Date   HGBA1C 11.7 04/07/2012   HGBA1C >14.0 10/14/2011   HGBA1C 8.2 05/28/2011   Lab Results  Component Value Date   LDLCALC 109* 03/15/2010   CREATININE 0.60 10/11/2011   Last microalbumin: No results found for this basename: MICROALBUR, [id number]   HYPERTENSION  BP Readings from Last 3 Encounters:  04/07/12 113/79  01/13/12 128/87  12/19/11 103/70    Hypertension ROS: taking medications as instructed, no medication side effects noted, no chest pain on exertion, no dyspnea on exertion and no swelling of ankles.   Bipolar disorder:  Self d/c abilify due to elevated blood glucoses.  Did not schedule follow-up with mental health provider.  Increased irritability.  No SI, Mania   Review of Systemssee HPI   see HPI Objective:   Physical Exam  GEN: Alert & Oriented, No acute distress CV:  Regular Rate & Rhythm, no murmur Respiratory:  Normal work of breathing, CTAB Abd:  + BS, soft, no tenderness to palpation Ext: no pre-tibial edema       Assessment & Plan:

## 2012-04-08 NOTE — Assessment & Plan Note (Addendum)
Poorly controlled but improved from >14 to 11.7  Compliant with lantus 60-65 units BID, was only taking novolog 4 units with meals.  Will increase to 8 units.  Asked her to keep glucose log and to bring glucometer to follow-up with Rx clinic.  Already on high doses of lantus, Will likely need to titrate up novolog, reassess compliance.  Encouraged her to keep up ith annual eye apopintment.

## 2012-04-08 NOTE — Assessment & Plan Note (Signed)
Well controlled today.

## 2012-04-10 ENCOUNTER — Other Ambulatory Visit: Payer: Medicaid Other

## 2012-04-10 DIAGNOSIS — I10 Essential (primary) hypertension: Secondary | ICD-10-CM

## 2012-04-10 DIAGNOSIS — E785 Hyperlipidemia, unspecified: Secondary | ICD-10-CM

## 2012-04-10 LAB — COMPREHENSIVE METABOLIC PANEL
AST: 16 U/L (ref 0–37)
Albumin: 3.9 g/dL (ref 3.5–5.2)
Alkaline Phosphatase: 87 U/L (ref 39–117)
BUN: 10 mg/dL (ref 6–23)
Potassium: 3.9 mEq/L (ref 3.5–5.3)
Sodium: 138 mEq/L (ref 135–145)
Total Bilirubin: 0.3 mg/dL (ref 0.3–1.2)

## 2012-04-10 LAB — CBC WITH DIFFERENTIAL/PLATELET
Basophils Relative: 0 % (ref 0–1)
Eosinophils Absolute: 0.1 10*3/uL (ref 0.0–0.7)
Eosinophils Relative: 1 % (ref 0–5)
Hemoglobin: 13.6 g/dL (ref 12.0–15.0)
Lymphs Abs: 3.8 10*3/uL (ref 0.7–4.0)
MCH: 28.2 pg (ref 26.0–34.0)
MCHC: 33.9 g/dL (ref 30.0–36.0)
MCV: 83.2 fL (ref 78.0–100.0)
Monocytes Absolute: 0.4 10*3/uL (ref 0.1–1.0)
Monocytes Relative: 6 % (ref 3–12)
Neutrophils Relative %: 39 % — ABNORMAL LOW (ref 43–77)
RBC: 4.82 MIL/uL (ref 3.87–5.11)

## 2012-04-10 LAB — LIPID PANEL
Cholesterol: 200 mg/dL (ref 0–200)
LDL Cholesterol: 139 mg/dL — ABNORMAL HIGH (ref 0–99)
Total CHOL/HDL Ratio: 4.7 Ratio
VLDL: 18 mg/dL (ref 0–40)

## 2012-04-10 NOTE — Progress Notes (Signed)
CMP,CBC WITH DIFF AND FLP DONE TODAY Margaret Larsen

## 2012-04-13 ENCOUNTER — Encounter: Payer: Self-pay | Admitting: Family Medicine

## 2012-04-13 ENCOUNTER — Telehealth: Payer: Self-pay | Admitting: Family Medicine

## 2012-04-13 DIAGNOSIS — E785 Hyperlipidemia, unspecified: Secondary | ICD-10-CM

## 2012-04-13 NOTE — Assessment & Plan Note (Signed)
Needs moderate intnsity statin which she is on, contact patient to ensure compliance as LDL is higher than previously

## 2012-04-13 NOTE — Telephone Encounter (Signed)
Called patient to discuss compliance with lipitor.  Will send letter

## 2012-04-20 ENCOUNTER — Telehealth: Payer: Self-pay | Admitting: Family Medicine

## 2012-04-20 NOTE — Telephone Encounter (Signed)
Please have patient call pharmacy to send [place] request

## 2012-04-20 NOTE — Telephone Encounter (Signed)
Pt doesn't have any of her cholesterol meds - needs some called into [facility]

## 2012-04-28 ENCOUNTER — Ambulatory Visit: Payer: Medicaid Other | Admitting: Pharmacist

## 2012-05-15 ENCOUNTER — Ambulatory Visit: Payer: Medicaid Other | Admitting: Pharmacist

## 2012-06-20 ENCOUNTER — Encounter (HOSPITAL_COMMUNITY): Payer: Self-pay | Admitting: Family

## 2012-06-20 ENCOUNTER — Inpatient Hospital Stay (HOSPITAL_COMMUNITY)
Admission: AD | Admit: 2012-06-20 | Discharge: 2012-06-20 | Disposition: A | Discharge status: Home / Self Care | Payer: Medicaid Other | Source: Ambulatory Visit | Attending: Obstetrics and Gynecology | Admitting: Obstetrics and Gynecology

## 2012-06-20 DIAGNOSIS — N949 Unspecified condition associated with female genital organs and menstrual cycle: Secondary | ICD-10-CM | POA: Insufficient documentation

## 2012-06-20 DIAGNOSIS — Z794 Long term (current) use of insulin: Secondary | ICD-10-CM | POA: Insufficient documentation

## 2012-06-20 DIAGNOSIS — L293 Anogenital pruritus, unspecified: Secondary | ICD-10-CM | POA: Insufficient documentation

## 2012-06-20 DIAGNOSIS — B3731 Acute candidiasis of vulva and vagina: Secondary | ICD-10-CM | POA: Insufficient documentation

## 2012-06-20 DIAGNOSIS — O2390 Unspecified genitourinary tract infection in pregnancy, unspecified trimester: Secondary | ICD-10-CM | POA: Insufficient documentation

## 2012-06-20 DIAGNOSIS — E119 Type 2 diabetes mellitus without complications: Secondary | ICD-10-CM | POA: Insufficient documentation

## 2012-06-20 DIAGNOSIS — O24919 Unspecified diabetes mellitus in pregnancy, unspecified trimester: Secondary | ICD-10-CM | POA: Insufficient documentation

## 2012-06-20 DIAGNOSIS — B373 Candidiasis of vulva and vagina: Secondary | ICD-10-CM

## 2012-06-20 LAB — URINALYSIS, ROUTINE W REFLEX MICROSCOPIC
Hgb urine dipstick: NEGATIVE
Leukocytes, UA: NEGATIVE
Nitrite: NEGATIVE
Protein, ur: NEGATIVE mg/dL
Specific Gravity, Urine: <1.005 — ABNORMAL LOW (ref 1.005–1.030)
Urobilinogen, UA: 0.2 mg/dL (ref 0.0–1.0)

## 2012-06-20 LAB — WET PREP, GENITAL

## 2012-06-20 LAB — URINE MICROSCOPIC-ADD ON

## 2012-06-20 MED ORDER — FLUCONAZOLE 150 MG PO TABS: 150.0000 mg | ORAL_TABLET | Freq: Once | ORAL | Status: DC

## 2012-06-20 MED ORDER — TERCONAZOLE 0.4 % VA CREA: 1.0000 | TOPICAL_CREAM | Freq: Every day | VAGINAL | Status: DC

## 2012-06-20 NOTE — MAU Provider Note (Signed)
History     CSN: [id number]  Arrival date and time: 06/20/12 1350   First Provider Initiated Contact with Patient 06/20/12 1413      Chief Complaint  Patient presents with  . Vaginal Itching   HPI Margaret Larsen 42 y.o. Having vaginal itching and discharge.  Thinks she has a yeast infection.  Has Type 2 Diabetes and has had several yeast infections.  Last treated with oral medications 2 weeks ago.  Last intercourse was 2 days ago.  No periods in several years - has had uterine ablation.   Past Medical History  Diagnosis Date  . Diabetes mellitus   . Renal disorder   . Kidney stones   . Hypertension   . Kidney stones     Past Surgical History  Procedure Laterality Date  . Cholecystectomy    . Cesarean section    . Tubal ligation    . Fiborids removed      Family History  Problem Relation Age of Onset  . Hypertension Mother   . Diabetes Mother   . Cancer Mother     History  Substance Use Topics  . Smoking status: Never Smoker   . Smokeless tobacco: Never Used  . Alcohol Use: No    Allergies:  Allergies  Allergen Reactions  . Codeine     Patient stated that it slowed her heart rate  . Diphenhydramine Hcl     REACTION: hives  . Fexofenadine-Pseudoephed Er     Patient stated that is slowed her heart rate  . Peanut-Containing Drug Products     REACTION: mouth swelling    Prescriptions prior to admission  Medication Sig Dispense Refill  . albuterol (PROVENTIL HFA;VENTOLIN HFA) 108 (90 BASE) MCG/ACT inhaler Inhale 2 puffs into the lungs every 6 (six) hours as needed for wheezing.  1 Inhaler  0  . ARIPiprazole (ABILIFY) 15 MG tablet Take 15 mg by mouth daily.      Marland Kitchen aspirin 81 MG EC tablet Take 81 mg by mouth daily.       Marland Kitchen atorvastatin (LIPITOR) 20 MG tablet Take 20 mg by mouth daily.      . cetirizine (ZYRTEC) 10 MG tablet Take 1 tablet (10 mg total) by mouth daily.  30 tablet  11  . EPINEPHrine (EPI-PEN) 0.3 mg/0.3 mL DEVI Inject 0.3 mLs (0.3 mg  total) into the muscle as needed. For allergic reaction  1 Device  0  . ibuprofen (ADVIL,MOTRIN) 800 MG tablet Take 1 tablet (800 mg total) by mouth every 8 (eight) hours as needed. for pain  30 tablet  0  . insulin aspart (NOVOLOG FLEXPEN) 100 UNIT/ML injection Inject 8 Units into the skin 3 (three) times daily before meals.  1 vial  12  . insulin glargine (LANTUS) 100 UNIT/ML injection Inject 60 Units into the skin 2 (two) times daily. 60 units int he morning, 65 units at night      . mometasone (NASONEX) 50 MCG/ACT nasal spray Place 2 sprays into the nose daily.  17 g  12  . montelukast (SINGULAIR) 10 MG tablet Take 1 tablet (10 mg total) by mouth at bedtime.  90 tablet  0  . quinapril-hydrochlorothiazide (ACCURETIC) 10-12.5 MG per tablet Take 1 tablet by mouth daily.  30 tablet  11    Review of Systems  Constitutional: Negative for fever.  Gastrointestinal: Negative for nausea, vomiting and abdominal pain.  Genitourinary:       Vaginal discharge and vaginal itching. No vaginal  bleeding. No dysuria.   Physical Exam   Blood pressure 132/87, pulse 112, temperature 98.1 F (36.7 C), temperature source Oral, resp. rate 18, height 4' 10.5" (1.486 m), weight 212 lb (96.163 kg).  Physical Exam  Nursing note and vitals reviewed. Constitutional: She is oriented to person, place, and time. She appears well-developed. No distress.  obese  HENT:  Head: Normocephalic.  Eyes: EOM are normal.  Neck: Neck supple.  Genitourinary:  Speculum exam: Vulva - edematous and pearlescent appearance to labia minor and clitoris, fissures noted bilaterally between labia minora and labia majora, R>L Vagina - Small amount of creamy discharge, no odor, erythema of vagina noted Cervix - unable to visualize fully Bimanual exam:   deferred GC/Chlam, wet prep done Chaperone present for exam.  Musculoskeletal: Normal range of motion.  Neurological: She is alert and oriented to person, place, and time.  Skin:  Skin is warm and dry.  Psychiatric: She has a normal mood and affect.    MAU Course  Procedures Results for orders placed during the hospital encounter of 06/20/12 (from the past 24 hour(s))  URINALYSIS, ROUTINE W REFLEX MICROSCOPIC     Status: Abnormal   Collection Time    06/20/12  2:00 PM      Result Value Range   Color, Urine YELLOW  YELLOW   APPearance CLEAR  CLEAR   Specific Gravity, Urine <1.005 (*) 1.005 - 1.030   pH 6.5  5.0 - 8.0   Glucose, UA >1000 (*) NEGATIVE mg/dL   Hgb urine dipstick NEGATIVE  NEGATIVE   Bilirubin Urine NEGATIVE  NEGATIVE   Ketones, ur NEGATIVE  NEGATIVE mg/dL   Protein, ur NEGATIVE  NEGATIVE mg/dL   Urobilinogen, UA 0.2  0.0 - 1.0 mg/dL   Nitrite NEGATIVE  NEGATIVE   Leukocytes, UA NEGATIVE  NEGATIVE  URINE MICROSCOPIC-ADD ON     Status: Abnormal   Collection Time    06/20/12  2:00 PM      Result Value Range   Squamous Epithelial / LPF FEW (*) RARE   WBC, UA 0-2  <3 WBC/hpf   RBC / HPF 0-2  <3 RBC/hpf  WET PREP, GENITAL     Status: Abnormal   Collection Time    06/20/12  2:15 PM      Result Value Range   Yeast Wet Prep HPF POC FEW (*) NONE SEEN   Trich, Wet Prep NONE SEEN  NONE SEEN   Clue Cells Wet Prep HPF POC FEW (*) NONE SEEN   WBC, Wet Prep HPF POC FEW (*) NONE SEEN    MDM Vulva has classic appearance of yeast infection.  Oral medications seem not to be clearing the vaginal yeast infection.  Likely the fissures in skin folds on vulva were from the last intercourse which she describes as painful.  Assessment and Plan  Vaginal yeast infection  Plan rx Terazol vaginal cream use one applicatorful in vagina at bedtime x 7 days. (one tube) no refills rx diflucan 150 mg One po as a single dose in case Terazol is not covered by Medicaid. Make sure you are checking your blood sugars and writing them down for your doctor to review. Yeast infections often occur when your blood sugar is too high. Use some water soluble lubricant when having  sex so that sex is not painful and is not causing any breaks in the skin. Get your prescriptions filled and use them as directed. Follow up with your doctor at [facility].  Edman Lipsey 06/20/2012, 2:21 PM

## 2012-06-20 NOTE — MAU Note (Signed)
Pt presents with complaints of vaginal itching for a couple of days and it has gotten worse today.

## 2012-06-20 NOTE — MAU Note (Signed)
Patient presents to MAU with c/o vaginal itching and vaginal burning. Reports she has T2DM; Lantus 65 twice daily and Novolog 6 units breakfast, lunch, dinner and before bed.  Reports she has been taking insulin as prescribed, but noticed symptoms after eating a donut.

## 2012-06-21 NOTE — MAU Provider Note (Signed)
Attestation of Attending Supervision of Advanced Practitioner (CNM/NP): Evaluation and management procedures were performed by the Advanced Practitioner under my supervision and collaboration.  I have reviewed the Advanced Practitioner's note and chart, and I agree with the management and plan.  Margaret Larsen 06/21/2012 7:22 AM

## 2012-06-22 LAB — GC/CHLAMYDIA PROBE AMP
CT Probe RNA: NEGATIVE
GC Probe RNA: NEGATIVE

## 2012-06-23 ENCOUNTER — Encounter: Payer: Self-pay | Admitting: *Deleted

## 2012-06-25 DIAGNOSIS — H40003 Preglaucoma, unspecified, bilateral: Secondary | ICD-10-CM | POA: Insufficient documentation

## 2012-07-14 ENCOUNTER — Encounter: Payer: Self-pay | Admitting: Family Medicine

## 2012-07-16 ENCOUNTER — Other Ambulatory Visit: Payer: Self-pay

## 2012-08-04 ENCOUNTER — Ambulatory Visit (INDEPENDENT_AMBULATORY_CARE_PROVIDER_SITE_OTHER): Payer: Medicaid Other | Admitting: Family Medicine

## 2012-08-04 ENCOUNTER — Encounter: Payer: Self-pay | Admitting: Family Medicine

## 2012-08-04 VITALS — BP 115/82 | HR 84 | Temp 98.1°F | Ht 59.0 in | Wt 217.0 lb

## 2012-08-04 DIAGNOSIS — E118 Type 2 diabetes mellitus with unspecified complications: Secondary | ICD-10-CM

## 2012-08-04 DIAGNOSIS — E1149 Type 2 diabetes mellitus with other diabetic neurological complication: Secondary | ICD-10-CM

## 2012-08-04 DIAGNOSIS — Z23 Encounter for immunization: Secondary | ICD-10-CM

## 2012-08-04 DIAGNOSIS — I10 Essential (primary) hypertension: Secondary | ICD-10-CM

## 2012-08-04 LAB — POCT GLYCOSYLATED HEMOGLOBIN (HGB A1C): Hemoglobin A1C: >14

## 2012-08-04 MED ORDER — QUINAPRIL-HYDROCHLOROTHIAZIDE 10-12.5 MG PO TABS: 1.0000 | ORAL_TABLET | Freq: Every day | ORAL | Status: DC

## 2012-08-04 MED ORDER — INSULIN ASPART 100 UNIT/ML FLEXPEN: 10.0000 [IU] | PEN_INJECTOR | Freq: Two times a day (BID) | SUBCUTANEOUS | Status: DC

## 2012-08-04 NOTE — Assessment & Plan Note (Signed)
Patient with diabetic neuropathy with symptoms of numbness his pain.  I have not added a new agent to treat this patient followup with me to address this further.

## 2012-08-04 NOTE — Assessment & Plan Note (Signed)
A: blood pressure is normal today off medication. P: restart medication for renal protection.

## 2012-08-04 NOTE — Patient Instructions (Addendum)
Ms.  Akter,   Thank you for coming in today. It was a pleasure meeting you.   1. Check Dr. Macky Lower schedule. Try to get in with his clinic to address your blood sugar. Bring your meter and medications.   2. Increase novolog to 10 U twice daily with meals.  3. Keep lantus the same.  Restart aspirin, accuretic and lipitor.   F/u with me in one month.

## 2012-08-04 NOTE — Progress Notes (Signed)
Subjective:     Patient ID: Margaret Larsen, female   DOB: 1970-09-13, 42 y.o.   MRN: [id number]  HPI 42 year old female presents for follow visit to discuss the following:  #1 uncontrolled diabetes: Patient reports taking insulin 70 units of Lantus twice daily along with 6 units of NovoLog twice daily. She admits to high sugars ranging from 200-400. She denies low CBGs. Review of systems is positive for polyuria and polydipsia. She also has a bilateral plantar numbness as well as numbness on the lateral aspect of her right thigh. She is noncompliant with aspirin and Lipitor. She is concerned that her blood today she takes for her bipolar depression is worsening her diabetes and causing weight gain. She eats 2 meals a day. She drinks roughly 2, 20 ounce bottles of Diet Coke daily. She has been to diabetic education twice.  #2 hypertension:  patient noncompliant with her antihypertensive Accuretic. She denies chest pain at shortness  of breath.  #3 health care maintenance: Patient is due for Pneumovax.  Review of Systems As per HPI     Objective:   Physical Exam BP 115/82  Pulse 84  Temp(Src) 98.1 F (36.7 C) (Oral)  Ht 4\' 11"  (1.499 m)  Wt 217 lb (98.431 kg)  BMI 43.81 kg/m2 General appearance: alert, cooperative, no distress and moderately obese Lungs: clear to auscultation bilaterally Heart: regular rate and rhythm, S1, S2 normal, no murmur, click, rub or gallop Abdomen: soft, non-tender; bowel sounds normal; no masses,  no organomegaly Extremities: obese  extremities normal, atraumatic, no cyanosis or edema Pulses: 2+ and symmetric  Diabetic foot exam done.  Lab Results  Component Value Date   HGBA1C >14.0 08/04/2012   Pneumovax given in the office today.    Assessment and Plan:

## 2012-08-04 NOTE — Assessment & Plan Note (Signed)
A: Uncontrolled with A1c, 0.7 to greater than 14. Patient is compliant with Lantus and is taking NovoLog twice daily with meals. She's taking a disproportionately large amount of Lantus compared to NovoLog. P: Increase NovoLog to 10 units twice daily with meals. Will keep Lantus the same for now. Patient has tried to schedule with pharmacy clinic in the past but the variability of pharmacy clinic does not match up well with her availability during the week. I have advised patient to check with the scheduled on the way out to see if any the date available dates would fit with her schedule. I have encouraged patient to restart aspirin and Lipitor in order to prevent heart attack and stroke. Patient is on board with this.

## 2012-08-05 ENCOUNTER — Telehealth: Payer: Self-pay | Admitting: Family Medicine

## 2012-08-05 MED ORDER — INSULIN GLARGINE 100 UNIT/ML ~~LOC~~ SOLN: 65.0000 [IU] | Freq: Two times a day (BID) | SUBCUTANEOUS | Status: DC

## 2012-08-05 MED ORDER — ATORVASTATIN CALCIUM 40 MG PO TABS: 40.0000 mg | ORAL_TABLET | Freq: Every day | ORAL | Status: DC

## 2012-08-05 NOTE — Telephone Encounter (Signed)
Pt was seen on 7/29 and only received two of her prescriptions. She did not receive the Lantus or Cholesterol medication. JW.

## 2012-08-05 NOTE — Telephone Encounter (Signed)
Pt aware.

## 2012-08-05 NOTE — Telephone Encounter (Signed)
Will forward to MD. Margaret Larsen,CMA  

## 2012-08-05 NOTE — Telephone Encounter (Signed)
meds sent in. Please inform patient.  

## 2012-08-07 ENCOUNTER — Ambulatory Visit (INDEPENDENT_AMBULATORY_CARE_PROVIDER_SITE_OTHER): Payer: Medicaid Other | Admitting: Family Medicine

## 2012-08-07 ENCOUNTER — Ambulatory Visit: Payer: Medicaid Other | Admitting: Family Medicine

## 2012-08-07 ENCOUNTER — Encounter: Payer: Self-pay | Admitting: Family Medicine

## 2012-08-07 VITALS — BP 123/88 | HR 81 | Temp 98.7°F | Ht 59.0 in | Wt 220.3 lb

## 2012-08-07 DIAGNOSIS — E1165 Type 2 diabetes mellitus with hyperglycemia: Secondary | ICD-10-CM

## 2012-08-07 DIAGNOSIS — E118 Type 2 diabetes mellitus with unspecified complications: Secondary | ICD-10-CM

## 2012-08-07 LAB — GLUCOSE, CAPILLARY: Glucose-Capillary: 320 mg/dL — ABNORMAL HIGH (ref 70–99)

## 2012-08-09 NOTE — Progress Notes (Signed)
Patient ID: Margaret Larsen, female   DOB: 02-05-1970, 42 y.o.   MRN: [id number] error

## 2012-08-11 ENCOUNTER — Telehealth: Payer: Self-pay | Admitting: *Deleted

## 2012-08-11 MED ORDER — INSULIN GLARGINE 100 UNIT/ML SOLOSTAR PEN: 65.0000 [IU] | PEN_INJECTOR | Freq: Two times a day (BID) | SUBCUTANEOUS | Status: DC

## 2012-08-11 MED ORDER — INSULIN PEN NEEDLE 31G X 8 MM MISC: 1.0000 | Freq: Four times a day (QID) | Status: DC

## 2012-08-11 NOTE — Telephone Encounter (Signed)
TC from pharmacy.  Pt has been using Lantus solostar pen and pen needles.  They received a Rx for Lantus vial.  Pt would like Rx changed to Lantus Solostar pen and pen needles.  Will fwd to Md.  Koden Hunzeker, Darlyne Russian, CMA

## 2012-08-11 NOTE — Telephone Encounter (Signed)
Called patient left VM asking about pen needle brand and size.  Called patient's pharmacy, advised to send in rx for small needles. rx sent in for needles and lantus.

## 2012-08-11 NOTE — Telephone Encounter (Signed)
Handled. See previous phone note

## 2012-08-14 ENCOUNTER — Encounter: Payer: Self-pay | Admitting: Family Medicine

## 2012-09-04 ENCOUNTER — Ambulatory Visit: Payer: Medicaid Other | Admitting: Family Medicine

## 2012-10-28 ENCOUNTER — Ambulatory Visit (INDEPENDENT_AMBULATORY_CARE_PROVIDER_SITE_OTHER): Payer: Medicaid Other | Admitting: Family Medicine

## 2012-10-28 ENCOUNTER — Other Ambulatory Visit: Payer: Self-pay | Admitting: Family Medicine

## 2012-10-28 ENCOUNTER — Encounter: Payer: Self-pay | Admitting: Family Medicine

## 2012-10-28 ENCOUNTER — Telehealth: Payer: Self-pay | Admitting: Family Medicine

## 2012-10-28 ENCOUNTER — Telehealth: Payer: Self-pay | Admitting: *Deleted

## 2012-10-28 VITALS — BP 130/94 | HR 85 | Temp 98.1°F | Ht 59.0 in | Wt 212.7 lb

## 2012-10-28 DIAGNOSIS — B373 Candidiasis of vulva and vagina: Secondary | ICD-10-CM

## 2012-10-28 MED ORDER — FLUCONAZOLE 150 MG PO TABS: ORAL_TABLET | ORAL | Status: DC

## 2012-10-28 MED ORDER — CLOTRIMAZOLE 2 % VA CREA: 1.0000 | TOPICAL_CREAM | Freq: Two times a day (BID) | VAGINAL | Status: DC

## 2012-10-28 MED ORDER — TERCONAZOLE 0.4 % VA CREA: 1.0000 | TOPICAL_CREAM | Freq: Every day | VAGINAL | Status: DC

## 2012-10-28 NOTE — Telephone Encounter (Signed)
Patient seen this AM by Dr. Paulina Fusi regarding this. Will route to Dr. Paulina Fusi to address.

## 2012-10-28 NOTE — Telephone Encounter (Addendum)
Pharmacy calling stating that gyne lotrimin prescribed at today's visit is OTC and patient cannot afford. Patient wants to know is she could have the Terazol 7 instead, since she has had this prescribed in the past. Pharmacy would need a new rx, will forward to MD  Complete.  Twana First Paulina Fusi, DO of [facility] 10/28/2012, 6:16 PM

## 2012-10-28 NOTE — Patient Instructions (Signed)
Prarthana, it was nice seeing you today.  Please try the medications we have prescribed for you including the clotrimazole cream for three days, and the diflucan pills today and then in three days if no improvement.  We will see you back as needed, or if this does not improve in about 7 days.  Thanks, Dr. Paulina Fusi

## 2012-10-28 NOTE — Progress Notes (Signed)
Margaret Larsen is a 43 y.o. female who presents today for vaginal pruritis/burning that has been ongoing for 3-4 days now.  She has had this in the past, especially with hyperglycemic episodes and is tx with diflucan which alleviates her Sx.  She denies any changes since her last episode and denies any vaginal bleeding, discharge, fever, chills, sweats, dysuria, hematuria, increased urgency/frequenecy.  States compliance with her diabetic regimen but her sugars >200 post prandial when she has checked.    Past Medical History  Diagnosis Date  . Diabetes mellitus   . Renal disorder   . Kidney stones   . Hypertension   . Kidney stones   . Hemorrhoids 07/10/2010    History  Smoking status  . Never Smoker   Smokeless tobacco  . Never Used   ROS: Per HPI.  All other systems reviewed and are negative.   Physical Exam Filed Vitals:   10/28/12 0929  BP: 130/94  Pulse: 85  Temp: 98.1 F (36.7 C)    Physical Examination: General appearance - alert, well appearing, and in no distress Heart - normal rate and regular rhythm, no murmurs noted   Lab Results  Component Value Date   HGBA1C >14.0 08/04/2012

## 2012-10-28 NOTE — Telephone Encounter (Signed)
Can we find out what she needs as I sent in the Clotrimazole instead of the Terconazole ointment?  Thanks, Twana First. Paulina Fusi, DO of [facility] 10/28/2012, 4:57 PM

## 2012-10-28 NOTE — Telephone Encounter (Signed)
Pt is calling for refill on terconazole be sent to her pharmacy. JW

## 2012-10-28 NOTE — Assessment & Plan Note (Signed)
Pt with typical S/Sx of vaginal vulvovaginitis.  She is having vaginal burning and pruiritis but denies any vaginal d/c or dysuria, fever, chills, sweats.  She states this is very typical for her when she has hyperglycemic episodes, which she has noticed recently.  Will tx with diflucan 150 mg x 1, repeat in three days along with clotrimazole cream BID for 3 days.  If no improvement over the next week, recommend f/u for speculum exam/cultures/testing.

## 2012-11-13 ENCOUNTER — Ambulatory Visit (INDEPENDENT_AMBULATORY_CARE_PROVIDER_SITE_OTHER): Payer: Medicaid Other | Admitting: Family Medicine

## 2012-11-13 ENCOUNTER — Encounter: Payer: Self-pay | Admitting: Family Medicine

## 2012-11-13 VITALS — BP 139/85 | HR 94 | Temp 97.8°F | Wt 214.0 lb

## 2012-11-13 DIAGNOSIS — K219 Gastro-esophageal reflux disease without esophagitis: Secondary | ICD-10-CM

## 2012-11-13 MED ORDER — RANITIDINE HCL 150 MG PO TABS: 150.0000 mg | ORAL_TABLET | Freq: Two times a day (BID) | ORAL | Status: DC

## 2012-11-13 NOTE — Assessment & Plan Note (Signed)
Your symptoms are consistent with GERD.  Start zantac one tab twice daily before lunch and before bed Small meals. Avoid  Triggers: spicy foods, acidic foods (tomato, citrus), overeating.   See me in 2-4 weeks for diabetes follow up.

## 2012-11-13 NOTE — Progress Notes (Signed)
  Subjective:    Patient ID: Margaret Larsen, female    DOB: 1970/02/02, 42 y.o.   MRN: [id number]  HPI 42 year old female with history of IDDM2 and peanut allergy presents for same-day visit to discuss the following:  #1 sore throat: x 4 days. On R side. Associated with dry cough that is worse at night and small volume emesis in the AM x 2-3 episodes. No fever, CP, SOB. Some watery eyes, itching eyes, clear rhinorrhea. Eats dinner at 9 PM (after class). Goes to bed at 11 PM.  Soc hx: reviewed. Denies ETOH and smoking.   Review of Systems As per HPI     Objective:   Physical Exam BP 139/85  Pulse 94  Temp(Src) 97.8 F (36.6 C) (Oral)  Wt 214 lb (97.07 kg) General appearance: alert, cooperative and no distress Throat: lips, mucosa, and tongue normal; teeth and gums normal Neck: no adenopathy, no carotid bruit, no JVD, supple, symmetrical, trachea midline and thyroid not enlarged, symmetric, no tenderness/mass/nodules Lungs: clear to auscultation bilaterally Heart: regular rate and rhythm, S1, S2 normal, no murmur, click, rub or gallop Extremities: extremities normal, atraumatic, no cyanosis or edema Skin: Skin color, texture, turgor normal. No rashes or lesions     Assessment & Plan:

## 2012-11-13 NOTE — Patient Instructions (Signed)
Margaret Larsen,   Thank you for coming in today. Your symptoms are consistent with GERD.  Start zantac one tab twice daily before lunch and before bed Small meals. Avoid  Triggers: spicy foods, acidic foods (tomato, citrus), overeating.   See me in 2-4 weeks for diabetes follow up.  Dr. Armen Pickup

## 2013-01-13 ENCOUNTER — Telehealth: Payer: Self-pay | Admitting: Family Medicine

## 2013-01-13 NOTE — Telephone Encounter (Signed)
LM for pt to call back.  Please give message below.  Thanks [employer]

## 2013-01-13 NOTE — Telephone Encounter (Signed)
Will forward to MD but pt will likely need to be seen in clinic. Jazmin Hartsell,CMA

## 2013-01-13 NOTE — Telephone Encounter (Signed)
She can try OTC Miconazol cream if she sure it's yeast, this should treat it (with 7 day's treatment).  To be sure and to have something called in, would need to be seen.

## 2013-01-13 NOTE — Telephone Encounter (Signed)
Has a yeast infection. Could you call in something? [facility] on [address]

## 2013-01-14 ENCOUNTER — Encounter: Payer: Self-pay | Admitting: Family Medicine

## 2013-01-14 ENCOUNTER — Ambulatory Visit (INDEPENDENT_AMBULATORY_CARE_PROVIDER_SITE_OTHER): Payer: Medicaid Other | Admitting: Family Medicine

## 2013-01-14 VITALS — BP 120/84 | HR 80 | Temp 98.0°F | Ht 59.0 in | Wt 200.0 lb

## 2013-01-14 DIAGNOSIS — L293 Anogenital pruritus, unspecified: Secondary | ICD-10-CM

## 2013-01-14 DIAGNOSIS — N898 Other specified noninflammatory disorders of vagina: Secondary | ICD-10-CM | POA: Insufficient documentation

## 2013-01-14 MED ORDER — FLUCONAZOLE 150 MG PO TABS: 150.0000 mg | ORAL_TABLET | Freq: Once | ORAL | Status: DC

## 2013-01-14 NOTE — Assessment & Plan Note (Signed)
Will treat based on clinical symptoms and h/o recurrent yeast infections Diflucan 150mg  x1 then repeat in 3 days Recommended pt bring up prolonged course of diflucan due to recurring infections

## 2013-01-14 NOTE — Patient Instructions (Signed)
You have a yeast infection. This will be best treated with diflucan. Please take your pill today then again in 3 days Please discuss long-term therapy for yeast infections with Dr. Adrian Blackwater at your next appointment

## 2013-01-14 NOTE — Progress Notes (Signed)
Margaret Larsen is a 43 y.o. female who presents to [facility] today for vaginal discharge   Vaginal itching: present for 3 days. Becoming more irritated. Has not tried anything this episode. Has tried OTC yeast creams w/o benefit. No recent ABX. DM w/ CBG in the 3-400 range. 4-5 yeast infections in past year. Deneis any abd pain, dysuria, frequency, fevers, constipation or diarrhea. Monogamous relationship w/ husband.    The following portions of the patient's history were reviewed and updated as appropriate: allergies, current medications, past medical history, family and social history, and problem list.  Patient is a nonsmoker.  Past Medical History  Diagnosis Date  . Diabetes mellitus   . Renal disorder   . Kidney stones   . Hypertension   . Kidney stones   . Hemorrhoids 07/10/2010    ROS as above otherwise neg.    Medications reviewed. Current Outpatient Prescriptions  Medication Sig Dispense Refill  . ACCU-CHEK SMARTVIEW test strip TEST 3 TIMES A DAY  100 each  11  . albuterol (PROVENTIL HFA;VENTOLIN HFA) 108 (90 BASE) MCG/ACT inhaler Inhale 2 puffs into the lungs every 6 (six) hours as needed for wheezing.  1 Inhaler  0  . aspirin 81 MG EC tablet Take 81 mg by mouth daily.       Marland Kitchen atorvastatin (LIPITOR) 40 MG tablet Take 1 tablet (40 mg total) by mouth daily.  90 tablet  0  . EPINEPHrine (EPI-PEN) 0.3 mg/0.3 mL DEVI Inject 0.3 mLs (0.3 mg total) into the muscle as needed. For allergic reaction  1 Device  0  . fluconazole (DIFLUCAN) 150 MG tablet Take 1 tablet (150 mg total) by mouth once. Repeat dose in 3 days  2 tablet  0  . insulin aspart (NOVOLOG FLEXPEN) 100 UNIT/ML SOPN FlexPen Inject 10 Units into the skin 2 (two) times daily with a meal.  2 pen  11  . Insulin Glargine (LANTUS SOLOSTAR) 100 UNIT/ML SOPN Inject 65 Units into the skin 2 (two) times daily.  13 pen  11  . Insulin Pen Needle (PX SHORTLENGTH PEN NEEDLES) 31G X 8 MM MISC 1 each by Does not apply route QID.  120 each   11  . lurasidone (LATUDA) 40 MG TABS Take 40 mg by mouth daily with breakfast.      . mometasone (NASONEX) 50 MCG/ACT nasal spray Place 2 sprays into the nose daily.  17 g  12  . quinapril-hydrochlorothiazide (ACCURETIC) 10-12.5 MG per tablet Take 1 tablet by mouth daily.  90 tablet  0  . ranitidine (ZANTAC) 150 MG tablet Take 1 tablet (150 mg total) by mouth 2 (two) times daily.  60 tablet  1   No current facility-administered medications for this visit.    Exam: BP 120/84  Pulse 80  Temp(Src) 98 F (36.7 C) (Oral)  Ht 4\' 11"  (1.499 m)  Wt 200 lb (90.719 kg)  BMI 40.37 kg/m2 Gen: Well NAD HEENT: EOMI,  MMM   No results found for this or any previous visit (from the past 71 hour(s)).  A/P (as seen in Problem list)  Vaginal itching Will treat based on clinical symptoms and h/o recurrent yeast infections Diflucan 150mg  x1 then repeat in 3 days Recommended pt bring up prolonged course of diflucan due to recurring infections

## 2013-01-22 ENCOUNTER — Encounter: Payer: Self-pay | Admitting: Family Medicine

## 2013-01-22 ENCOUNTER — Ambulatory Visit (INDEPENDENT_AMBULATORY_CARE_PROVIDER_SITE_OTHER): Payer: Medicaid Other | Admitting: Family Medicine

## 2013-01-22 VITALS — BP 122/77 | HR 71 | Temp 98.1°F | Resp 18 | Wt 202.0 lb

## 2013-01-22 DIAGNOSIS — L293 Anogenital pruritus, unspecified: Secondary | ICD-10-CM

## 2013-01-22 DIAGNOSIS — E118 Type 2 diabetes mellitus with unspecified complications: Secondary | ICD-10-CM

## 2013-01-22 DIAGNOSIS — IMO0002 Reserved for concepts with insufficient information to code with codable children: Secondary | ICD-10-CM

## 2013-01-22 DIAGNOSIS — E119 Type 2 diabetes mellitus without complications: Secondary | ICD-10-CM

## 2013-01-22 DIAGNOSIS — E1165 Type 2 diabetes mellitus with hyperglycemia: Secondary | ICD-10-CM

## 2013-01-22 DIAGNOSIS — N898 Other specified noninflammatory disorders of vagina: Secondary | ICD-10-CM

## 2013-01-22 LAB — POCT GLYCOSYLATED HEMOGLOBIN (HGB A1C): Hemoglobin A1C: >14

## 2013-01-22 MED ORDER — FLUCONAZOLE 150 MG PO TABS: 150.0000 mg | ORAL_TABLET | Freq: Once | ORAL | Status: DC

## 2013-01-22 MED ORDER — INSULIN ASPART 100 UNIT/ML FLEXPEN: 10.0000 [IU] | PEN_INJECTOR | Freq: Two times a day (BID) | SUBCUTANEOUS | Status: DC

## 2013-01-22 MED ORDER — AMMONIUM LACTATE 12 % EX CREA: TOPICAL_CREAM | CUTANEOUS | Status: DC | PRN

## 2013-01-22 MED ORDER — INSULIN ASPART 100 UNIT/ML FLEXPEN: 15.0000 [IU] | PEN_INJECTOR | Freq: Two times a day (BID) | SUBCUTANEOUS | Status: DC

## 2013-01-22 NOTE — Progress Notes (Signed)
   Subjective:    Patient ID: Margaret Larsen, female    DOB: 1970-10-22, 43 y.o.   MRN: [id number]  HPI  43 yo F presents with her toddler granddaughter to discuss the following:  1. DM2:  Taking insulin. Checking CBGs. Fasting: 200-250 on average  Post prandial 200-400 Eating breakfast, snack, dinner.  Positive: lightheaded, dizzy, numbness in legs and bottom of feet.  Negative: CP, SOB, tingling or numbness in hands.   2.  Vaginitis: was seen last week with vaginal itching and discharge. Not new sex partner. Discharge cleared with oral diflucan. No pelvic exam or cultures down at previous evaluation.   Soc Hx: non smoker HM: due for pap. Refused pap today.   Review of Systems As per HPI     Objective:   Physical Exam There were no vitals taken for this visit. Wt Readings from Last 3 Encounters:  01/14/13 200 lb (90.719 kg)  11/13/12 214 lb (97.07 kg)  10/28/12 212 lb 11.2 oz (96.48 kg)  General appearance: alert, cooperative and no distress Lungs: clear to auscultation bilaterally Heart: regular rate and rhythm, S1, S2 normal, no murmur, click, rub or gallop Extremities: extremities normal, atraumatic, no cyanosis or edema   Lab Results  Component Value Date   HGBA1C >14.0 01/22/2013       Assessment & Plan:

## 2013-01-22 NOTE — Patient Instructions (Signed)
Samone,  Thank you for coming in today.   Your A1c is still elevated.  I have increased your novolog.   Please f/u in 1-2 months for pap smear.  Dr. Adrian Blackwater

## 2013-01-22 NOTE — Assessment & Plan Note (Signed)
A: continue poor control. Patient reports compliance with insulin. P: Increase novolog from 10 to 15 U BID F/u in 1-2 weeks Recheck CBG at f/u Patient to bring medications to f/u.

## 2013-01-22 NOTE — Assessment & Plan Note (Signed)
A: resolved with oral diflucan so vaginal candidiasis most likely. P: refilled difucan since patient at high risk for recurrence in the setting of elevated CBGs. Wet prep and Gc/Chlam when patient returns in 1-2 months for pap.

## 2013-03-02 ENCOUNTER — Ambulatory Visit: Payer: Medicaid Other | Admitting: Family Medicine

## 2013-03-05 ENCOUNTER — Emergency Department (HOSPITAL_COMMUNITY)
Admission: EM | Admit: 2013-03-05 | Discharge: 2013-03-05 | Disposition: A | Discharge status: Home / Self Care | Payer: Medicaid Other | Source: Home / Self Care

## 2013-03-05 ENCOUNTER — Encounter (HOSPITAL_COMMUNITY): Payer: Self-pay | Admitting: Emergency Medicine

## 2013-03-05 DIAGNOSIS — J069 Acute upper respiratory infection, unspecified: Secondary | ICD-10-CM

## 2013-03-05 NOTE — ED Notes (Signed)
Cough since Monday; minimal relief w OTC medications

## 2013-03-05 NOTE — ED Provider Notes (Signed)
CSN: [id number]     Arrival date & time 03/05/13  1631 History   First MD Initiated Contact with Patient 03/05/13 1750     Chief Complaint  Patient presents with  . Cough   (Consider location/radiation/quality/duration/timing/severity/associated sxs/prior Treatment) Patient is a 43 y.o. female presenting with cough. The history is provided by the patient.  Cough Cough characteristics:  Dry Severity:  Moderate Onset quality:  Gradual Duration:  5 days Timing:  Intermittent Progression:  Unchanged Chronicity:  New Smoker: no   Context: upper respiratory infection   Relieved by:  Cough suppressants Ineffective treatments:  Cough suppressants Associated symptoms: headaches and rhinorrhea   Associated symptoms: no chest pain, no chills, no diaphoresis, no ear fullness, no ear pain, no eye discharge, no fever, no myalgias, no rash, no shortness of breath, no sore throat and no wheezing     Past Medical History  Diagnosis Date  . Diabetes mellitus   . Renal disorder   . Kidney stones   . Hypertension   . Kidney stones   . Hemorrhoids 07/10/2010   Past Surgical History  Procedure Laterality Date  . Cholecystectomy    . Cesarean section    . Tubal ligation    . Fiborids removed     Family History  Problem Relation Age of Onset  . Hypertension Mother   . Diabetes Mother   . Cancer Mother    History  Substance Use Topics  . Smoking status: Never Smoker   . Smokeless tobacco: Never Used  . Alcohol Use: No   OB History   Grav Para Term Preterm Abortions TAB SAB Ect Mult Living                 Review of Systems  Constitutional: Negative for fever, chills and diaphoresis.  HENT: Positive for postnasal drip and rhinorrhea. Negative for ear pain and sore throat.   Eyes: Negative for discharge.  Respiratory: Positive for cough. Negative for shortness of breath and wheezing.   Cardiovascular: Negative for chest pain.  Gastrointestinal: Negative.   Musculoskeletal: Negative  for myalgias.  Skin: Negative for rash.  Neurological: Positive for headaches.    Allergies  Codeine; Diphenhydramine hcl; Fexofenadine-pseudoephed er; Peanut-containing drug products; and Shellfish allergy  Home Medications   Current Outpatient Rx  Name  Route  Sig  Dispense  Refill  . ACCU-CHEK SMARTVIEW test strip      TEST 3 TIMES A DAY   100 each   11   . albuterol (PROVENTIL HFA;VENTOLIN HFA) 108 (90 BASE) MCG/ACT inhaler   Inhalation   Inhale 2 puffs into the lungs every 6 (six) hours as needed for wheezing.   1 Inhaler   0   . ammonium lactate (LAC-HYDRIN) 12 % cream   Topical   Apply topically as needed for dry skin.   385 g   0   . aspirin 81 MG EC tablet   Oral   Take 81 mg by mouth daily.          Marland Kitchen atorvastatin (LIPITOR) 40 MG tablet   Oral   Take 1 tablet (40 mg total) by mouth daily.   90 tablet   0   . EPINEPHrine (EPI-PEN) 0.3 mg/0.3 mL DEVI   Intramuscular   Inject 0.3 mLs (0.3 mg total) into the muscle as needed. For allergic reaction   1 Device   0   . fluconazole (DIFLUCAN) 150 MG tablet   Oral   Take 1 tablet (150 mg  total) by mouth once. Repeat dose in 3 days   2 tablet   3   . insulin aspart (NOVOLOG FLEXPEN) 100 UNIT/ML FlexPen   Subcutaneous   Inject 15 Units into the skin 2 (two) times daily with a meal.   15 pen   11   . Insulin Glargine (LANTUS SOLOSTAR) 100 UNIT/ML SOPN   Subcutaneous   Inject 65 Units into the skin 2 (two) times daily.   13 pen   11   . Insulin Pen Needle (PX SHORTLENGTH PEN NEEDLES) 31G X 8 MM MISC   Does not apply   1 each by Does not apply route QID.   120 each   11   . lurasidone (LATUDA) 40 MG TABS   Oral   Take 40 mg by mouth daily with breakfast.         . mometasone (NASONEX) 50 MCG/ACT nasal spray   Nasal   Place 2 sprays into the nose daily.   17 g   12   . quinapril-hydrochlorothiazide (ACCURETIC) 10-12.5 MG per tablet   Oral   Take 1 tablet by mouth daily.   90 tablet    0   . ranitidine (ZANTAC) 150 MG tablet   Oral   Take 1 tablet (150 mg total) by mouth 2 (two) times daily.   60 tablet   1    BP 128/63  Pulse 102  Temp(Src) 98.6 F (37 C) (Oral)  Resp 16  SpO2 98% Physical Exam  Nursing note and vitals reviewed. Constitutional: She is oriented to person, place, and time. She appears well-developed and well-nourished. No distress.  HENT:  Mouth/Throat: No oropharyngeal exudate.  Vital TMs normal Oropharynx with normal color, moderate amount of clear PND  Eyes: Conjunctivae and EOM are normal.  Neck: Normal range of motion. Neck supple.  Cardiovascular: Normal rate, regular rhythm and normal heart sounds.   Pulmonary/Chest: Effort normal and breath sounds normal. No respiratory distress. She has no wheezes. She has no rales.  Musculoskeletal: Normal range of motion. She exhibits no edema.  Lymphadenopathy:    She has no cervical adenopathy.  Neurological: She is alert and oriented to person, place, and time.  Skin: Skin is warm and dry. No rash noted.  Psychiatric: She has a normal mood and affect.    ED Course  Procedures (including critical care time) Labs Review Labs Reviewed - No data to display Imaging Review No results found.   MDM   1. URI (upper respiratory infection)    Alka-Seltzer cold plus night Tylenol when necessary Drink plenty of  fluids URI instructions Followup your PCP as needed or if worse.   Janne Napoleon, NP 03/05/13 [phone]

## 2013-03-05 NOTE — ED Provider Notes (Signed)
Medical screening examination/treatment/procedure(s) were performed by non-physician practitioner and as supervising physician I was immediately available for consultation/collaboration.  Philipp Deputy, M.D.  Harden Mo, MD 03/05/13 [phone]

## 2013-03-05 NOTE — Discharge Instructions (Signed)
Cough, Adult  A cough is a reflex that helps clear your throat and airways. It can help heal the body or may be a reaction to an irritated airway. A cough may only last 2 or 3 weeks (acute) or may last more than 8 weeks (chronic).  CAUSES Acute cough:  Viral or bacterial infections. Chronic cough:  Infections.  Allergies.  Asthma.  Post-nasal drip.  Smoking.  Heartburn or acid reflux.  Some medicines.  Chronic lung problems (COPD).  Cancer. SYMPTOMS   Cough.  Fever.  Chest pain.  Increased breathing rate.  High-pitched whistling sound when breathing (wheezing).  Colored mucus that you cough up (sputum). TREATMENT   A bacterial cough may be treated with antibiotic medicine.  A viral cough must run its course and will not respond to antibiotics.  Your caregiver may recommend other treatments if you have a chronic cough. HOME CARE INSTRUCTIONS   Only take over-the-counter or prescription medicines for pain, discomfort, or fever as directed by your caregiver. Use cough suppressants only as directed by your caregiver.  Use a cold steam vaporizer or humidifier in your bedroom or home to help loosen secretions.  Sleep in a semi-upright position if your cough is worse at night.  Rest as needed.  Stop smoking if you smoke. SEEK IMMEDIATE MEDICAL CARE IF:   You have pus in your sputum.  Your cough starts to worsen.  You cannot control your cough with suppressants and are losing sleep.  You begin coughing up blood.  You have difficulty breathing.  You develop pain which is getting worse or is uncontrolled with medicine.  You have a fever. MAKE SURE YOU:   Understand these instructions.  Will watch your condition.  Will get help right away if you are not doing well or get worse. Document Released: 06/22/2010 Document Revised: 03/18/2011 Document Reviewed: 06/22/2010 [facility] Patient Information 2014 [place].  Upper Respiratory Infection,  Adult Alkaline seltzer cold plus night medication for your symptoms such as cough and drainage Drink plenty of fluids, stay well-hydrated Use nasal saline frequently to help nasal drainage An upper respiratory infection (URI) is also sometimes known as the common cold. The upper respiratory tract includes the nose, sinuses, throat, trachea, and bronchi. Bronchi are the airways leading to the lungs. Most people improve within 1 week, but symptoms can last up to 2 weeks. A residual cough may last even longer.  CAUSES Many different viruses can infect the tissues lining the upper respiratory tract. The tissues become irritated and inflamed and often become very moist. Mucus production is also common. A cold is contagious. You can easily spread the virus to others by oral contact. This includes kissing, sharing a glass, coughing, or sneezing. Touching your mouth or nose and then touching a surface, which is then touched by another person, can also spread the virus. SYMPTOMS  Symptoms typically develop 1 to 3 days after you come in contact with a cold virus. Symptoms vary from person to person. They may include:  Runny nose.  Sneezing.  Nasal congestion.  Sinus irritation.  Sore throat.  Loss of voice (laryngitis).  Cough.  Fatigue.  Muscle aches.  Loss of appetite.  Headache.  Low-grade fever. DIAGNOSIS  You might diagnose your own cold based on familiar symptoms, since most people get a cold 2 to 3 times a year. Your caregiver can confirm this based on your exam. Most importantly, your caregiver can check that your symptoms are not due to another disease such  as strep throat, sinusitis, pneumonia, asthma, or epiglottitis. Blood tests, throat tests, and X-rays are not necessary to diagnose a common cold, but they may sometimes be helpful in excluding other more serious diseases. Your caregiver will decide if any further tests are required. RISKS AND COMPLICATIONS  You may be at risk  for a more severe case of the common cold if you smoke cigarettes, have chronic heart disease (such as heart failure) or lung disease (such as asthma), or if you have a weakened immune system. The very young and very old are also at risk for more serious infections. Bacterial sinusitis, middle ear infections, and bacterial pneumonia can complicate the common cold. The common cold can worsen asthma and chronic obstructive pulmonary disease (COPD). Sometimes, these complications can require emergency medical care and may be life-threatening. PREVENTION  The best way to protect against getting a cold is to practice good hygiene. Avoid oral or hand contact with people with cold symptoms. Wash your hands often if contact occurs. There is no clear evidence that vitamin C, vitamin E, echinacea, or exercise reduces the chance of developing a cold. However, it is always recommended to get plenty of rest and practice good nutrition. TREATMENT  Treatment is directed at relieving symptoms. There is no cure. Antibiotics are not effective, because the infection is caused by a virus, not by bacteria. Treatment may include:  Increased fluid intake. Sports drinks offer valuable electrolytes, sugars, and fluids.  Breathing heated mist or steam (vaporizer or shower).  Eating chicken soup or other clear broths, and maintaining good nutrition.  Getting plenty of rest.  Using gargles or lozenges for comfort.  Controlling fevers with ibuprofen or acetaminophen as directed by your caregiver.  Increasing usage of your inhaler if you have asthma. Zinc gel and zinc lozenges, taken in the first 24 hours of the common cold, can shorten the duration and lessen the severity of symptoms. Pain medicines may help with fever, muscle aches, and throat pain. A variety of non-prescription medicines are available to treat congestion and runny nose. Your caregiver can make recommendations and may suggest nasal or lung inhalers for other  symptoms.  HOME CARE INSTRUCTIONS   Only take over-the-counter or prescription medicines for pain, discomfort, or fever as directed by your caregiver.  Use a warm mist humidifier or inhale steam from a shower to increase air moisture. This may keep secretions moist and make it easier to breathe.  Drink enough water and fluids to keep your urine clear or pale yellow.  Rest as needed.  Return to work when your temperature has returned to normal or as your caregiver advises. You may need to stay home longer to avoid infecting others. You can also use a face mask and careful hand washing to prevent spread of the virus. SEEK MEDICAL CARE IF:   After the first few days, you feel you are getting worse rather than better.  You need your caregiver's advice about medicines to control symptoms.  You develop chills, worsening shortness of breath, or brown or red sputum. These may be signs of pneumonia.  You develop yellow or brown nasal discharge or pain in the face, especially when you bend forward. These may be signs of sinusitis.  You develop a fever, swollen neck glands, pain with swallowing, or white areas in the back of your throat. These may be signs of strep throat. SEEK IMMEDIATE MEDICAL CARE IF:   You have a fever.  You develop severe or persistent headache, ear  pain, sinus pain, or chest pain.  You develop wheezing, a prolonged cough, cough up blood, or have a change in your usual mucus (if you have chronic lung disease).  You develop sore muscles or a stiff neck. Document Released: 06/19/2000 Document Revised: 03/18/2011 Document Reviewed: 04/27/2010 [facility] Patient Information 2014 [place], [place].

## 2013-03-15 ENCOUNTER — Encounter: Payer: Self-pay | Admitting: Family Medicine

## 2013-03-15 ENCOUNTER — Other Ambulatory Visit (HOSPITAL_COMMUNITY)
Admission: RE | Admit: 2013-03-15 | Discharge: 2013-03-15 | Disposition: A | Discharge status: Home / Self Care | Payer: Medicaid Other | Source: Ambulatory Visit | Attending: Family Medicine | Admitting: Family Medicine

## 2013-03-15 ENCOUNTER — Ambulatory Visit (INDEPENDENT_AMBULATORY_CARE_PROVIDER_SITE_OTHER): Payer: Medicaid Other | Admitting: Family Medicine

## 2013-03-15 VITALS — BP 124/80 | HR 74 | Temp 98.4°F | Wt 201.0 lb

## 2013-03-15 DIAGNOSIS — Z1151 Encounter for screening for human papillomavirus (HPV): Secondary | ICD-10-CM | POA: Insufficient documentation

## 2013-03-15 DIAGNOSIS — Z789 Other specified health status: Secondary | ICD-10-CM

## 2013-03-15 DIAGNOSIS — Z124 Encounter for screening for malignant neoplasm of cervix: Secondary | ICD-10-CM | POA: Insufficient documentation

## 2013-03-15 DIAGNOSIS — Z9189 Other specified personal risk factors, not elsewhere classified: Secondary | ICD-10-CM | POA: Insufficient documentation

## 2013-03-15 DIAGNOSIS — J40 Bronchitis, not specified as acute or chronic: Secondary | ICD-10-CM | POA: Insufficient documentation

## 2013-03-15 MED ORDER — AZITHROMYCIN 500 MG PO TABS: 500.0000 mg | ORAL_TABLET | Freq: Every day | ORAL | Status: AC

## 2013-03-15 NOTE — Assessment & Plan Note (Signed)
Requires documentation of Hep B immunity. HepB S antibody ordered.  If non-immune patient will need series.

## 2013-03-15 NOTE — Patient Instructions (Signed)
Margaret Larsen,  Thank you for coming in today. I will be in touch with pap and results of Hepatitis B immunity. If you are not immune you will need a vaccine series.  For cough please take azithromycin for 6 days continue OTC mucinex and delsym as well.  Dr. Adrian Blackwater

## 2013-03-15 NOTE — Assessment & Plan Note (Signed)
X 3 weeks. No improvement with OTCs. Treat with azithromycin x 5 days. Continue OTCs.

## 2013-03-15 NOTE — Progress Notes (Signed)
   Subjective:    Patient ID: Margaret Larsen, female    DOB: 1970-12-19, 43 y.o.   MRN: [id number]  HPI 43 yo F presents for f/u visit:  1. Cough: x 3 weeks. Productive of green-yellow sputum at night. No worsening but no improvement. Non smoker, no fever, no chills, no CP, no known sick contacts. No improvement with mucinex, delsym, robitussin.   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 Believes she is immune but has no records. 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   3. HM: due for pap. Sexually active. No discharge. No pain with intercourse. No vaginal lesions.   Soc Hx: non smoker  Review of Systems As per HPI     Objective:   Physical Exam BP 124/80  Pulse 74  Temp(Src) 98.4 F (36.9 C) (Oral)  Wt 201 lb (91.173 kg) General appearance: alert, cooperative and no distress Nose: no discharge, turbinates pink, swollen Throat: lips, mucosa, and tongue normal; teeth and gums normal Lungs: normal WOB, coarse BS b/l  Heart: regular rate and rhythm, S1, S2 normal, no murmur, click, rub or gallop Pelvic: cervix normal in appearance, external genitalia normal, no adnexal masses or tenderness, no cervical motion tenderness, positive findings: vaginal discharge:  white and thin, rectovaginal septum normal and uterus normal size, shape, and consistency  Pap done.     Assessment & Plan:

## 2013-03-16 ENCOUNTER — Telehealth: Payer: Self-pay | Admitting: *Deleted

## 2013-03-16 LAB — HEPATITIS B SURFACE ANTIBODY,QUALITATIVE: Hep B S Ab: NEGATIVE

## 2013-03-16 NOTE — Telephone Encounter (Signed)
Pt is aware of these results.  Nurse appt made for 03-19-13. Jazmin Hartsell,CMA

## 2013-03-16 NOTE — Telephone Encounter (Signed)
Message copied by Valerie Roys on Tue Mar 16, 2013 12:15 PM ------      Message from: Boykin Nearing      Created: Tue Mar 16, 2013 11:36 AM       Patient is Hep B S Ab negative.       She will need to be immunized against hep B.      Please inform patient and have her schedule immunization.        ------

## 2013-03-18 ENCOUNTER — Encounter: Payer: Self-pay | Admitting: Family Medicine

## 2013-03-19 ENCOUNTER — Ambulatory Visit (INDEPENDENT_AMBULATORY_CARE_PROVIDER_SITE_OTHER): Payer: Medicaid Other | Admitting: *Deleted

## 2013-03-19 VITALS — BP 103/83 | HR 91 | Temp 97.9°F

## 2013-03-19 DIAGNOSIS — Z23 Encounter for immunization: Secondary | ICD-10-CM

## 2013-03-19 NOTE — Progress Notes (Signed)
   Pt in clinic to start Hep B series.  Pt stated she was seen on 03/15/2013 and treated for Bronchitis.  Symptoms are not getting better and requested X ray.  Will forward to PCP for review.  Derl Barrow, RN

## 2013-03-22 ENCOUNTER — Telehealth: Payer: Self-pay | Admitting: Family Medicine

## 2013-03-22 DIAGNOSIS — R87610 Atypical squamous cells of undetermined significance on cytologic smear of cervix (ASC-US): Secondary | ICD-10-CM | POA: Insufficient documentation

## 2013-03-22 DIAGNOSIS — J40 Bronchitis, not specified as acute or chronic: Secondary | ICD-10-CM

## 2013-03-22 NOTE — Telephone Encounter (Signed)
Patient called 03/15/2013: ASCUS, HPV negative pap. Patient with uncontrolled DM2. Plan for f/u colposcopy.  Prefers afternoon appt, 3:45 PM  or later.   Patient reports persistent nighttime cough despite complete azithromycin. Plan for CXR. Ordered.

## 2013-03-23 ENCOUNTER — Ambulatory Visit (HOSPITAL_COMMUNITY)
Admission: RE | Admit: 2013-03-23 | Discharge: 2013-03-23 | Disposition: A | Discharge status: Home / Self Care | Payer: Medicaid Other | Source: Ambulatory Visit | Attending: Family Medicine | Admitting: Family Medicine

## 2013-03-23 DIAGNOSIS — J40 Bronchitis, not specified as acute or chronic: Secondary | ICD-10-CM

## 2013-03-23 DIAGNOSIS — R05 Cough: Secondary | ICD-10-CM | POA: Insufficient documentation

## 2013-03-23 DIAGNOSIS — R0602 Shortness of breath: Secondary | ICD-10-CM | POA: Insufficient documentation

## 2013-03-23 DIAGNOSIS — R059 Cough, unspecified: Secondary | ICD-10-CM | POA: Insufficient documentation

## 2013-03-23 NOTE — Telephone Encounter (Signed)
LM for pt to call back.  Please assist her in making a colpo appt.  She is already aware of message below.  Thanks [employer]

## 2013-03-24 ENCOUNTER — Telehealth: Payer: Self-pay | Admitting: Family Medicine

## 2013-03-24 MED ORDER — CETIRIZINE HCL 10 MG PO TABS: 10.0000 mg | ORAL_TABLET | Freq: Every day | ORAL | Status: DC

## 2013-03-24 NOTE — Telephone Encounter (Signed)
Patient called.  Patient having nighttime cough. Negative CXR. Cough productive, yellow sputum, trouble sleeping. Nasal congestion and nasal itching  Plan: Zyrtec q HS.  Reassurance

## 2013-04-01 ENCOUNTER — Ambulatory Visit (INDEPENDENT_AMBULATORY_CARE_PROVIDER_SITE_OTHER): Payer: Medicaid Other | Admitting: Family Medicine

## 2013-04-01 VITALS — BP 134/90 | HR 92 | Temp 98.0°F | Wt 211.8 lb

## 2013-04-01 DIAGNOSIS — R87610 Atypical squamous cells of undetermined significance on cytologic smear of cervix (ASC-US): Secondary | ICD-10-CM

## 2013-04-01 NOTE — Assessment & Plan Note (Signed)
Colposcopy negative 04/01/2013. Recommend follow up pap smear with co-testing in 3 years.

## 2013-04-01 NOTE — Patient Instructions (Addendum)
The results of the colposcopy are negative, and this means you need a pap smear again ( in 45yr or co-testing in 3 years) which will include testing for HPV.   Colposcopy, Care After Refer to this sheet in the next few weeks. These instructions provide you with information on caring for yourself after your procedure. Your health care provider may also give you more specific instructions. Your treatment has been planned according to current medical practices, but problems sometimes occur. Call your health care provider if you have any problems or questions after your procedure. WHAT TO EXPECT AFTER THE PROCEDURE  After your procedure, it is typical to have the following:  Cramping. This often goes away in a few minutes.  Soreness. This may last for 2 days.  Lightheadedness. Lie down for a few minutes if this occurs. You may also have some bleeding or dark discharge for a few days. You may need to wear a sanitary pad during this time. HOME CARE INSTRUCTIONS  Avoid sex, douching, and using tampons for 3 days or as directed by your health care provider.  Only take over-the-counter or prescription medicines as directed by your health care provider. Do not take aspirin because it can cause bleeding.  Continue to take birth control pills if you are on them.  Not all test results are available during your visit. If your test results are not back during the visit, make an appointment with your health care provider to find out the results. Do not assume everything is normal if you have not heard from your health care provider or the medical facility. It is important for you to follow up on all of your test results.  Follow your health care provider's advice regarding activity, follow-up visits, and follow-up Pap tests. SEEK MEDICAL CARE IF:  You develop a rash.  You have problems with your medicine. SEEK IMMEDIATE MEDICAL CARE IF:  You are bleeding heavily or are passing blood clots.  You have  a fever.  You have abnormal vaginal discharge.  You are having cramps that do not go away after taking your pain medicine.  You feel lightheaded, dizzy, or faint.  You have stomach pain. Document Released: 10/14/2012 Document Reviewed: 07/23/2012 [facility] Patient Information 2014 [place], [place].

## 2013-04-01 NOTE — Progress Notes (Addendum)
  Colposcopy Procedure Note  Indications: Pap smear 1 months ago showed: ASCUS with NEGATIVE high risk HPV. The prior pap showed no abnormalities.  Prior cervical/vaginal disease: normal exam without visible pathology. Prior cervical treatment: no treatment.  Procedure Details  The risks and benefits of the procedure and Written informed consent obtained and placed into chart.  Speculum placed in vagina and excellent visualization of cervix achieved, cervix swabbed x1 to clean and then x 3 with acetic acid solution. No lesions were noted. Iodine was swabbed with full coverage and again, no lesions were noted.  Findings: Cervix: no visible lesions, no mosaicism and no abnormal vasculature; no biopsies taken. Vaginal inspection: normal without visible lesions. Vulvar colposcopy: vulvar colposcopy not performed. (Vulva exam normal)  Specimens: None  Complications: none.  Plan: Treatment options discussed with patient. Recommend follow-up pap smear in 3 years with HPV cotesting. ( But suggestion made to follow up with cytology testing in a year, she will follow up with her PCP in 1-2 wks for further discussion).  Zaven Klemens B. Bonner Puna, MD, PGY-1 04/01/2013 9:27 AM  FMTS/GYN ATTENDING NOTE Ronette Deter I  have seen and examined this patient, reviewed their chart. I have discussed this patient with the resident. I agree with the resident's findings, assessment and care plan.My addendum added in orange text.

## 2013-05-03 ENCOUNTER — Other Ambulatory Visit: Payer: Self-pay | Admitting: *Deleted

## 2013-05-03 DIAGNOSIS — E118 Type 2 diabetes mellitus with unspecified complications: Principal | ICD-10-CM

## 2013-05-03 DIAGNOSIS — IMO0002 Reserved for concepts with insufficient information to code with codable children: Secondary | ICD-10-CM

## 2013-05-03 DIAGNOSIS — E1165 Type 2 diabetes mellitus with hyperglycemia: Secondary | ICD-10-CM

## 2013-05-03 MED ORDER — AMMONIUM LACTATE 12 % EX CREA: TOPICAL_CREAM | CUTANEOUS | Status: DC | PRN

## 2013-06-04 ENCOUNTER — Encounter: Payer: Self-pay | Admitting: Family Medicine

## 2013-06-04 ENCOUNTER — Ambulatory Visit (INDEPENDENT_AMBULATORY_CARE_PROVIDER_SITE_OTHER): Payer: Medicaid Other | Admitting: Family Medicine

## 2013-06-04 VITALS — BP 128/80 | HR 83 | Temp 98.3°F | Wt 201.0 lb

## 2013-06-04 DIAGNOSIS — IMO0002 Reserved for concepts with insufficient information to code with codable children: Secondary | ICD-10-CM

## 2013-06-04 DIAGNOSIS — E118 Type 2 diabetes mellitus with unspecified complications: Principal | ICD-10-CM

## 2013-06-04 DIAGNOSIS — Z23 Encounter for immunization: Secondary | ICD-10-CM

## 2013-06-04 DIAGNOSIS — E1165 Type 2 diabetes mellitus with hyperglycemia: Secondary | ICD-10-CM

## 2013-06-04 DIAGNOSIS — K219 Gastro-esophageal reflux disease without esophagitis: Secondary | ICD-10-CM

## 2013-06-04 LAB — POCT GLYCOSYLATED HEMOGLOBIN (HGB A1C): Hemoglobin A1C: >14

## 2013-06-04 MED ORDER — RANITIDINE HCL 150 MG PO TABS: 150.0000 mg | ORAL_TABLET | Freq: Two times a day (BID) | ORAL | Status: DC

## 2013-06-04 MED ORDER — ATORVASTATIN CALCIUM 40 MG PO TABS: 40.0000 mg | ORAL_TABLET | Freq: Every day | ORAL | Status: DC

## 2013-06-04 MED ORDER — INSULIN GLARGINE 100 UNIT/ML SOLOSTAR PEN: 60.0000 [IU] | PEN_INJECTOR | SUBCUTANEOUS | Status: DC

## 2013-06-04 MED ORDER — INSULIN ASPART 100 UNIT/ML FLEXPEN: 25.0000 [IU] | PEN_INJECTOR | Freq: Two times a day (BID) | SUBCUTANEOUS | Status: DC

## 2013-06-04 MED ORDER — CETIRIZINE HCL 10 MG PO TABS: 10.0000 mg | ORAL_TABLET | Freq: Every day | ORAL | Status: DC

## 2013-06-04 MED ORDER — ALBUTEROL SULFATE HFA 108 (90 BASE) MCG/ACT IN AERS: 2.0000 | INHALATION_SPRAY | Freq: Four times a day (QID) | RESPIRATORY_TRACT | Status: DC | PRN

## 2013-06-04 MED ORDER — METFORMIN HCL ER 500 MG PO TB24: 1000.0000 mg | ORAL_TABLET | Freq: Every day | ORAL | Status: DC

## 2013-06-04 MED ORDER — LISINOPRIL 10 MG PO TABS: 10.0000 mg | ORAL_TABLET | Freq: Every day | ORAL | Status: DC

## 2013-06-04 NOTE — Progress Notes (Signed)
   Subjective:    Patient ID: SHENE MAXFIELD, female    DOB: 06/06/1970, 43 y.o.   MRN: [id number] CC: discuss diabetes, interested in insulin pump.  HPI 43 yo F with DM2 since 2009 that has been uncontrolled since 10/2011 presents for f.u visit.   1. CHRONIC DIABETES  Disease Monitoring  Blood Sugar Ranges: 322  Polyuria: yes   Visual problems: yes   Tingling/numbess in extremities: yes   Medication Compliance: yes, ran out of insulin two days ago. No to statin and ACE inhibitor.  Medication Side Effects  Hypoglycemia: no   Preventitive Health Care  Eye Exam: done. Due next month.  Foot Exam: due in two months   Diet pattern: eats 1.5 meals per day   Exercise: none.     Soc Hx: non smoker  Review of Systems As per HPI     Objective:   Physical Exam BP 128/80  Pulse 83  Temp(Src) 98.3 F (36.8 C) (Oral)  Wt 201 lb (91.173 kg) Wt Readings from Last 3 Encounters:  06/04/13 201 lb (91.173 kg)  04/01/13 211 lb 12.8 oz (96.072 kg)  03/15/13 201 lb (91.173 kg)  General appearance: alert, cooperative and no distress Lungs: clear to auscultation bilaterally Heart: regular rate and rhythm, S1, S2 normal, no murmur, click, rub or gallop Extremities: extremities normal, atraumatic, no cyanosis or edema, normal pulses.   Lab Results  Component Value Date   HGBA1C >14.0 06/04/2013      Assessment & Plan:

## 2013-06-04 NOTE — Patient Instructions (Signed)
Margaret Larsen,  Thank you for coming in today. Made some med changes today. See list.  Endocrinology referral.  See me in 2 weeks for f/u blood work.  Dr. Adrian Blackwater

## 2013-06-04 NOTE — Assessment & Plan Note (Signed)
A: persistently uncontrolled. Not compliant with statin or ACE inhibitor. Known nephropathy, peripheral neuropathy and likely mild gastroparesis resulting in reflux symptoms. P: Change insulin regimen: Add back metformin XR (did not tolerate regular metformin) 1000 mg q PM  Decrease lantus to 60 U q AM Increase novolog to 25 U BID Restart statin-refills sent Start ACE inhibitor, urine ACR today. F/u in two week for [facility] Endocrinology referral for assistance with management.

## 2013-06-05 LAB — MICROALBUMIN / CREATININE URINE RATIO
Creatinine, Urine: 56.3 mg/dL
MICROALB/CREAT RATIO: 8.9 mg/g (ref 0.0–30.0)
Microalb, Ur: 0.5 mg/dL (ref 0.00–1.89)

## 2013-06-07 ENCOUNTER — Encounter: Payer: Self-pay | Admitting: *Deleted

## 2013-06-18 ENCOUNTER — Ambulatory Visit (INDEPENDENT_AMBULATORY_CARE_PROVIDER_SITE_OTHER): Payer: Medicaid Other | Admitting: Family Medicine

## 2013-06-18 ENCOUNTER — Encounter: Payer: Self-pay | Admitting: Family Medicine

## 2013-06-18 VITALS — BP 131/83 | HR 96 | Temp 98.0°F | Wt 204.0 lb

## 2013-06-18 DIAGNOSIS — G5711 Meralgia paresthetica, right lower limb: Secondary | ICD-10-CM

## 2013-06-18 DIAGNOSIS — E1165 Type 2 diabetes mellitus with hyperglycemia: Secondary | ICD-10-CM

## 2013-06-18 DIAGNOSIS — G5710 Meralgia paresthetica, unspecified lower limb: Secondary | ICD-10-CM

## 2013-06-18 DIAGNOSIS — E118 Type 2 diabetes mellitus with unspecified complications: Principal | ICD-10-CM

## 2013-06-18 DIAGNOSIS — IMO0002 Reserved for concepts with insufficient information to code with codable children: Secondary | ICD-10-CM

## 2013-06-18 MED ORDER — INSULIN GLARGINE 100 UNIT/ML SOLOSTAR PEN: 60.0000 [IU] | PEN_INJECTOR | SUBCUTANEOUS | Status: DC

## 2013-06-18 MED ORDER — FLUCONAZOLE 150 MG PO TABS: 150.0000 mg | ORAL_TABLET | ORAL | Status: DC

## 2013-06-18 MED ORDER — GABAPENTIN 300 MG PO CAPS: 300.0000 mg | ORAL_CAPSULE | Freq: Every day | ORAL | Status: DC

## 2013-06-18 NOTE — Patient Instructions (Signed)
Ms. Freeman,  Thank you for coming back to see me today.  Regarding diabetes: Thank you for trying metformin. You can stop it. I have removed it from your med list.  Continue lisinopril to prevent diabetic nephropathy (kidney disease from diabetes). Checking BMP today.  Sent in diflucan. Sent in 3 month supply of lantus.   Restart gabapentin for neuropathy, better than regular use of ibuprofen. Start 300 mg at night for one week, then twice daily for the next week, then three times daily. Back off if you develop dizziness, lightheadedness or fatigue.   F/u in 4 weeks.  You will meet your new primary doctor. You should hear about endocrinology appointment by the end of next week.   Dr. Adrian Blackwater

## 2013-06-18 NOTE — Progress Notes (Signed)
   Subjective:    Patient ID: Margaret Larsen, female    DOB: 01-21-1970, 43 y.o.   MRN: [id number] CC: DM2 F/u  HPI 43 yo F presents for f/u DM2. She tried glucophage it upset her stomach. She is taking insulin. getting 1 box of lantus (5 pens) at a time. This last for < one month. I prescribed 6 pens at last visit.  Reports CBGs are the same. No lows. Still with vaginal discharge. Still with peripheral neuropathy. Request ibuprofen refill. Reports gabapentin 100 mg TID did not help. Awaiting call about endocrinology referral. Compliant with ACE i. Denies cough.   Soc hx: non smoker  Review of Systems As per HPI    Objective:   Physical Exam BP 131/83  Pulse 96  Temp(Src) 98 F (36.7 C) (Oral)  Wt 204 lb (92.534 kg) General appearance: alert, cooperative and no distress Lungs: normal WOB.      Assessment & Plan:

## 2013-06-18 NOTE — Assessment & Plan Note (Addendum)
A:  Unchanged. Did not tolerate metformin. P:  D/c metformin. Inquired about endocrinology referral, being processed by clinic staff, patient should be called by end of next week.  Retry gabapentin for peripheral neuropathy per orders starting with 300 mg qHS Continue ACE inhibitor plan to recheck albumin-creatinine ratio ACR in 2-3 months with plan for up titration of ACE inhibitor at Cr allows to push ACR down as much as possible.  Checking Cr today.  F/u in 4 weeks, to meet new PCP.  refilled lantus x 3 month supply.

## 2013-06-19 LAB — BASIC METABOLIC PANEL
BUN: 6 mg/dL (ref 6–23)
CO2: 27 meq/L (ref 19–32)
CREATININE: 0.65 mg/dL (ref 0.50–1.10)
Calcium: 8.5 mg/dL (ref 8.4–10.5)
Chloride: 98 mEq/L (ref 96–112)
GLUCOSE: 432 mg/dL — AB (ref 70–99)
Potassium: 4.1 mEq/L (ref 3.5–5.3)
Sodium: 133 mEq/L — ABNORMAL LOW (ref 135–145)

## 2013-06-24 ENCOUNTER — Telehealth: Payer: Self-pay | Admitting: Family Medicine

## 2013-06-24 MED ORDER — GLIPIZIDE 10 MG PO TABS: 10.0000 mg | ORAL_TABLET | Freq: Two times a day (BID) | ORAL | Status: DC

## 2013-06-24 NOTE — Telephone Encounter (Signed)
Called patient.  hyperglycemia on last BMP Adding glipizide 10 mg once daily then BID after one week. Moving forward with endocrinology referral.

## 2013-06-28 ENCOUNTER — Encounter: Payer: Self-pay | Admitting: Endocrinology

## 2013-06-28 ENCOUNTER — Ambulatory Visit (INDEPENDENT_AMBULATORY_CARE_PROVIDER_SITE_OTHER): Payer: Medicaid Other | Admitting: Endocrinology

## 2013-06-28 ENCOUNTER — Other Ambulatory Visit: Payer: Self-pay | Admitting: *Deleted

## 2013-06-28 VITALS — BP 114/66 | HR 98 | Temp 97.7°F | Resp 14 | Ht 59.0 in | Wt 204.0 lb

## 2013-06-28 DIAGNOSIS — E118 Type 2 diabetes mellitus with unspecified complications: Principal | ICD-10-CM

## 2013-06-28 DIAGNOSIS — IMO0002 Reserved for concepts with insufficient information to code with codable children: Secondary | ICD-10-CM

## 2013-06-28 DIAGNOSIS — E1142 Type 2 diabetes mellitus with diabetic polyneuropathy: Secondary | ICD-10-CM

## 2013-06-28 DIAGNOSIS — E785 Hyperlipidemia, unspecified: Secondary | ICD-10-CM

## 2013-06-28 DIAGNOSIS — E1165 Type 2 diabetes mellitus with hyperglycemia: Secondary | ICD-10-CM

## 2013-06-28 MED ORDER — PIOGLITAZONE HCL 30 MG PO TABS: 30.0000 mg | ORAL_TABLET | Freq: Every day | ORAL | Status: DC

## 2013-06-28 MED ORDER — "INSULIN SYRINGE 31G X 5/16"" 0.3 ML MISC": Status: DC

## 2013-06-28 MED ORDER — INSULIN REGULAR HUMAN (CONC) 500 UNIT/ML ~~LOC~~ SOLN: SUBCUTANEOUS | Status: DC

## 2013-06-28 NOTE — Progress Notes (Signed)
Patient ID: Margaret Larsen, female   DOB: 1970/12/23, 43 y.o.   MRN: [id number]    Reason for Appointment: Consultation for  Diabetes  Referring physician: Funches  History of Present Illness:          Diagnosis: Type 2 diabetes mellitus, date of diagnosis: 2010      Past history:  Her blood sugar was about 500 at the time of diagnosis and she was symptomatic with hypoglycemia She was tried on oral hypoglycemic drugs for a short time and these were not effective. She thinks metformin caused diarrhea and was not continued She has been on various insulin regimens since her diagnosis Blood sugars were only mildly increased in 2012-2013 with A1c at 8%  Recent history: However since late 2013 her blood sugars have been much higher progressively increasing A1c which has been consistently above the limit now She has been on various doses of Lantus insulin and until recently was taking 65 units twice a day without adequate control However at that time was taking only 4-6 units of NovoLog at mealtimes Recently her Lantus dose has only been 60 units with increasing her NovoLog 25 units twice a day her blood sugars have not improved at all She does feel excessively thirsty and has nocturia 6-7 times She is usually drinking water when she is thirsty and avoiding drinks with sugar She is now referred here for further management She did not bring her monitor for download but she thinks her blood sugars are somewhat higher later in the day       Oral hypoglycemic drugs the patient is taking are: None, was prescribed glipizide but has not started this      Side effects from medications have been: INSULIN regimen is described as: Novolog 25 tid a.c., Lantus 60 units once a day in a.m.     Glucose monitoring:  done one time a day         Glucometer: ?      Blood Glucose readings from: 200-400 throughout the day  Hypoglycemia: None      Glycemic control:  Lab Results  Component Value Date   HGBA1C >14.0 06/04/2013   HGBA1C >14.0 01/22/2013   HGBA1C >14.0 08/04/2012   Lab Results  Component Value Date   MICROALBUR 0.50 06/04/2013   LDLCALC 139* 04/10/2012   CREATININE 0.65 06/18/2013    Self-care: The diet that the patient has been following is: tries to limit fat intake.      Meals: 3 meals per day.          Exercise:  occasionally walking 2-3/7 days a week         Dietician visit: Most recent: At diagnosis.               Compliance with the medical regimen:  Retinal exam: Most recent:2014    Weight history: Wt Readings from Last 3 Encounters:  06/28/13 204 lb (92.534 kg)  06/18/13 204 lb (92.534 kg)  06/04/13 201 lb (91.173 kg)      Medication List       This list is accurate as of: 06/28/13  4:41 PM.  Always use your most recent med list.               ACCU-CHEK SMARTVIEW test strip  Generic drug:  glucose blood  TEST 3 TIMES A DAY     albuterol 108 (90 BASE) MCG/ACT inhaler  Commonly known as:  PROVENTIL HFA;VENTOLIN HFA  Inhale 2 puffs  into the lungs every 6 (six) hours as needed for wheezing.     ammonium lactate 12 % cream  Commonly known as:  LAC-HYDRIN  Apply topically as needed for dry skin.     aspirin 81 MG EC tablet  Take 81 mg by mouth daily.     atorvastatin 40 MG tablet  Commonly known as:  LIPITOR  Take 1 tablet (40 mg total) by mouth daily.     cetirizine 10 MG tablet  Commonly known as:  ZYRTEC  Take 1 tablet (10 mg total) by mouth at bedtime.     EPINEPHrine 0.3 mg/0.3 mL Devi  Commonly known as:  EPI-PEN  Inject 0.3 mLs (0.3 mg total) into the muscle as needed. For allergic reaction     fluconazole 150 MG tablet  Commonly known as:  DIFLUCAN  Take 1 tablet (150 mg total) by mouth every 3 (three) days.     gabapentin 300 MG capsule  Commonly known as:  NEURONTIN  Take 1 capsule (300 mg total) by mouth at bedtime.     glipiZIDE 10 MG tablet  Commonly known as:  GLUCOTROL  Take 1 tablet (10 mg total) by mouth 2 (two)  times daily before a meal. One daily with breakfast for one week then BID     insulin aspart 100 UNIT/ML FlexPen  Commonly known as:  NOVOLOG FLEXPEN  Inject 25 Units into the skin 2 (two) times daily with a meal.     Insulin Glargine 100 UNIT/ML Solostar Pen  Commonly known as:  LANTUS SOLOSTAR  Inject 60 Units into the skin every morning.     Insulin Pen Needle 31G X 8 MM Misc  Commonly known as:  PX SHORTLENGTH PEN NEEDLES  1 each by Does not apply route QID.     lisinopril 10 MG tablet  Commonly known as:  PRINIVIL,ZESTRIL  Take 1 tablet (10 mg total) by mouth daily.     lurasidone 40 MG Tabs tablet  Commonly known as:  LATUDA  Take 40 mg by mouth daily with breakfast.     mometasone 50 MCG/ACT nasal spray  Commonly known as:  NASONEX  Place 2 sprays into the nose daily.     ranitidine 150 MG tablet  Commonly known as:  ZANTAC  Take 1 tablet (150 mg total) by mouth 2 (two) times daily.        Allergies:  Allergies  Allergen Reactions  . Codeine     Patient stated that it slowed her heart rate  . Diphenhydramine Hcl     REACTION: hives  . Fexofenadine-Pseudoephed Er     Patient stated that is slowed her heart rate  . Peanut-Containing Drug Products     REACTION: mouth swelling  . Shellfish Allergy Itching    Past Medical History  Diagnosis Date  . Diabetes mellitus   . Renal disorder   . Kidney stones   . Hypertension   . Kidney stones   . Hemorrhoids 07/10/2010    Past Surgical History  Procedure Laterality Date  . Cholecystectomy    . Cesarean section    . Tubal ligation    . Fiborids removed      Family History  Problem Relation Age of Onset  . Hypertension Mother   . Diabetes Mother   . Cancer Mother     Social History:  reports that she has never smoked. She has never used smokeless tobacco. She reports that she does not drink alcohol or use  illicit drugs.    Review of Systems       Lipids: Has had hyperlipidemia treated with Lipitor  but no recent levels available       Lab Results  Component Value Date   CHOL 200 04/10/2012   HDL 43 04/10/2012   LDLCALC 139* 04/10/2012   TRIG 92 04/10/2012   CHOLHDL 4.7 04/10/2012        No unusual headaches.                  Skin: No rash or infections     Thyroid:  No  unusual fatigue.     The blood pressure has been controlled with low-dose lisinopril     No swelling of feet.     No shortness of breath or chest discomfort on exertion.     Bowel habits: Normal. Has had some reflux       Has had frequency of urination or nocturia       Currently has amenorrhea secondary to her uterine ablation for fibroids      No joint  pains.      Has had atypical  depression           Has history of Numbness,  burning in feet and leg occasionally has feeling of needles sticking her feet for about one year     Has had recurrent Candida infections treated with Diflucan by PCP   LABS:  No visits with results within 1 Week(s) from this visit. Latest known visit with results is:  Office Visit on 06/18/2013  Component Date Value Ref Range Status  . Sodium 06/18/2013 133* 135 - 145 mEq/L Final  . Potassium 06/18/2013 4.1  3.5 - 5.3 mEq/L Final  . Chloride 06/18/2013 98  96 - 112 mEq/L Final  . CO2 06/18/2013 27  19 - 32 mEq/L Final  . Glucose, Bld 06/18/2013 432* 70 - 99 mg/dL Final  . BUN 06/18/2013 6  6 - 23 mg/dL Final  . Creat 06/18/2013 0.65  0.50 - 1.10 mg/dL Final  . Calcium 06/18/2013 8.5  8.4 - 10.5 mg/dL Final    Physical Examination:  BP 114/66  Pulse 98  Temp(Src) 97.7 F (36.5 C)  Resp 14  Ht 4\' 11"  (1.499 m)  Wt 204 lb (92.534 kg)  BMI 41.18 kg/m2  SpO2 97%  GENERAL:         Patient has marked generalized obesity.   HEENT:         Eye exam shows normal external appearance. Fundus exam shows no retinopathy. Oral exam shows normal mucosa .  NECK:         General:  Neck exam shows no lymphadenopathy. Carotids are normal to palpation and no bruit heard.   Thyroid is not enlarged and no nodules felt.   LUNGS:         Chest is symmetrical. Lungs are clear to auscultation.Marland Kitchen   HEART:         Heart sounds:  S1 and S2 are normal. No murmurs or clicks heard., no S3 or S4.   ABDOMEN:   There is no distention present. Liver and spleen are not palpable. No other mass or tenderness present.  EXTREMITIES:     There is no edema. No skin lesions present.Marland Kitchen  NEUROLOGICAL:   Vibration sense is markedly reduced in toes. Ankle jerks are absent bilaterally.          Diabetic foot exam:  Mild decrease in distal sensation and  normal pulses MUSCULOSKELETAL:       There is no enlargement or deformity of the joints. Spine is normal to inspection.Marland Kitchen   SKIN:       No rash; she has  acanthosis  on her neck. Minimal hirsutism      ASSESSMENT:  Diabetes type 2, uncontrolled     She has had very marked hyperglycemia in the last 2 years or so persistently high A1c over 14% despite using about 150 units of insulin a day She also has significant obesity With her acanthosis she does appear to have significant insulin resistance Currently not on any insulin sensitizers and had been intolerant to metformin previously Since her blood sugars are high throughout the day she does need significant amount of basal insulin and also higher doses of mealtime coverage She does need better diabetes education and information on meal planning along with efforts to lose weight  Complications: Peripheral neuropathy with sensory loss  History of mild hypertension: Her blood pressure is low normal and she reportedly is taking HCTZ. Most likely is currently volume depleted from significant hyperglycemia and does not need a diuretic  Hyperlipidemia: No recent levels available and will need to have lipids checked when blood sugars are better controlled  PLAN:   Since her recent blood sugars have been well over 300 frequently will need to increase her insulin before trying other drugs like  Victoza She is also not a good candidate for Invokana because of recurrent Candida vaginitis Since she is intolerant to metformin she will be a good candidate to try an insulin sensitizer like Actos She will probably have better response to using U-500 insulin compared to NovoLog insulin and he should help with overnight hyperglycemia also Currently taking only 60 units of Lantus insulin and this is inadequate  Patient Instructions  Stop HCTZ diuretic LANTUS insulin: Take 70 units in the morning and 30 units in the evening  U-500 insulin: Start taking 12 units before each meal with a syringe. Prefer to take this 30 minutes before eating  If the sugar in the morning is still over 200 after 5 days increase the dose of U-500 insulin to 14 units 3 times a day  New medication: Pioglitazone 30 mg daily. No need for glipizide    followup in [redacted] weeks along with appointment nurse educator   Total visit time including counseling = 60 minutes  Evalena Fujii 06/28/2013, 4:41 PM   Note: This office note was prepared with Estate agent. Any transcriptional errors that result from this process are unintentional.

## 2013-06-28 NOTE — Patient Instructions (Addendum)
Stop HCTZ diuretic LANTUS insulin: Take 70 units in the morning and 30 units in the evening  U-500 insulin: Start taking 12 units before each meal with a syringe. Prefer to take this 30 minutes before eating  If the sugar in the morning is still over 200 after 5 days increase the dose of U-500 insulin to 14 units 3 times a day  New medication: Pioglitazone 30 mg daily. No need for glipizide

## 2013-06-29 ENCOUNTER — Telehealth: Payer: Self-pay | Admitting: Family Medicine

## 2013-06-29 DIAGNOSIS — IMO0002 Reserved for concepts with insufficient information to code with codable children: Secondary | ICD-10-CM

## 2013-06-29 DIAGNOSIS — E118 Type 2 diabetes mellitus with unspecified complications: Principal | ICD-10-CM

## 2013-06-29 DIAGNOSIS — E1165 Type 2 diabetes mellitus with hyperglycemia: Secondary | ICD-10-CM

## 2013-06-29 MED ORDER — ACCU-CHEK FASTCLIX LANCETS MISC: 1.0000 | Freq: Three times a day (TID) | Status: DC

## 2013-06-29 MED ORDER — GLUCOSE BLOOD VI STRP: 1.0000 | ORAL_STRIP | Freq: Three times a day (TID) | Status: DC

## 2013-06-29 MED ORDER — ACCU-CHEK NANO SMARTVIEW W/DEVICE KIT: 1.0000 | PACK | Status: DC | PRN

## 2013-06-29 NOTE — Telephone Encounter (Signed)
Sent in Rx for new meter, test strips and lancets. Please inform patient.

## 2013-06-29 NOTE — Telephone Encounter (Signed)
Would like new diabetes meter

## 2013-06-30 MED ORDER — INSULIN GLARGINE 100 UNIT/ML SOLOSTAR PEN: PEN_INJECTOR | SUBCUTANEOUS | Status: DC

## 2013-06-30 NOTE — Addendum Note (Signed)
Addended by: Boykin Nearing on: 06/30/2013 12:26 PM   Modules accepted: Orders

## 2013-06-30 NOTE — Telephone Encounter (Signed)
New lantus Rx sent in

## 2013-06-30 NOTE — Telephone Encounter (Signed)
Pt called back.  Her Endocrinologist changed her Lantus Rx.  She is to take 70units in the AM and 40units in PM.  She will need a new Rx sent to the pharmacy indicating this. Fleeger, Salome Spotted

## 2013-06-30 NOTE — Telephone Encounter (Signed)
Pt informed. Fleeger, Jessica Dawn  

## 2013-07-06 ENCOUNTER — Encounter: Payer: Medicaid Other | Attending: Endocrinology | Admitting: Nutrition

## 2013-07-06 DIAGNOSIS — Z794 Long term (current) use of insulin: Secondary | ICD-10-CM | POA: Diagnosis not present

## 2013-07-06 DIAGNOSIS — E1149 Type 2 diabetes mellitus with other diabetic neurological complication: Secondary | ICD-10-CM | POA: Insufficient documentation

## 2013-07-06 DIAGNOSIS — Z713 Dietary counseling and surveillance: Secondary | ICD-10-CM | POA: Diagnosis not present

## 2013-07-06 NOTE — Progress Notes (Signed)
Pt. Is here today because she wants to go on the V-Go.  She said that she saw this at the doctor's office and is very much interested.  I told her that she would have to discuss this with Dr. Dwyane Dee.   Medication dose:  Lantus:  AM: 70u around 9-10AM, and PM: 30u around 10PM                             U-500 R insulin  She has increased the dose to 14u and is taking it while she is eating her meal. I encouraged her to take this about 30 min. Before the meal, so that it can be working before her blood sugar starts to rise.  She agreed to do this.  SBGM:  She is only testing once a day at best--at different times.  Today it was 275 before breakfast( she had eating watermellon last night), other times it is 156, 97, 214.,  AcL: 231, 176, acS:  110, 157.    Meals are high in fat, but low in carbs.  Bfast is eggs, small amount of grits, and 2-3 pieces of bacon.  Lunch is sandwich, with chips.  Supper is meat-fried fish, or fried chicken, with 1-2 green veg. And 1 starchy veg.  She ususally drinks unsweet or diet drinks, but admits to sometimes drinking sweet drinks.   Discussed the idea of a balanced meal and suggested that she limit fried foods to only 3 times/wk.Marland Kitchen  Also suggested that she limit bacon or sausage to no more than 2 pieces at at time.    Suggested that she test her blood sugars twice a day--fasting and 1 other time.  Suggestions given for this.  She promised to call me in one week with blood sugar readings.   We will discussed insulin adjustments when she calls back.

## 2013-07-06 NOTE — Patient Instructions (Signed)
Test blood sugars every morning before breakfast and one other time during the day. Call me in one week with blood sugar readings. Limit bacon and sausage to 2 pieces at breakfast meal.

## 2013-07-14 ENCOUNTER — Ambulatory Visit: Payer: Medicaid Other | Admitting: Endocrinology

## 2013-07-15 ENCOUNTER — Ambulatory Visit (INDEPENDENT_AMBULATORY_CARE_PROVIDER_SITE_OTHER): Payer: Medicaid Other | Admitting: Endocrinology

## 2013-07-15 ENCOUNTER — Encounter: Payer: Self-pay | Admitting: Endocrinology

## 2013-07-15 VITALS — BP 111/90 | HR 87 | Temp 98.3°F | Resp 16 | Ht 59.0 in | Wt 209.6 lb

## 2013-07-15 DIAGNOSIS — E1165 Type 2 diabetes mellitus with hyperglycemia: Secondary | ICD-10-CM

## 2013-07-15 DIAGNOSIS — I10 Essential (primary) hypertension: Secondary | ICD-10-CM

## 2013-07-15 DIAGNOSIS — IMO0002 Reserved for concepts with insufficient information to code with codable children: Secondary | ICD-10-CM

## 2013-07-15 DIAGNOSIS — E785 Hyperlipidemia, unspecified: Secondary | ICD-10-CM

## 2013-07-15 DIAGNOSIS — E118 Type 2 diabetes mellitus with unspecified complications: Principal | ICD-10-CM

## 2013-07-15 NOTE — Patient Instructions (Addendum)
U-500 insulin: 15 units in am, 12 at lunch and 15 at supper; add 2 more units if over 200  Check sugar at least 2 x daily  Restart Lisinopril

## 2013-07-15 NOTE — Progress Notes (Signed)
Patient ID: Margaret Larsen, female   DOB: April 02, 1970, 43 y.o.   MRN: [id number]    Reason for Appointment: F/u for  Diabetes  Referring physician: Funches  History of Present Illness:          Diagnosis: Type 2 diabetes mellitus, date of diagnosis: 2010      Past history:  Her blood sugar was about 500 at the time of diagnosis and she was symptomatic with hypoglycemia She was tried on oral hypoglycemic drugs for a short time and these were not effective. She thinks metformin caused diarrhea and was not continued She has been on various insulin regimens since her diagnosis Blood sugars were only mildly increased in 2012-2013 with A1c at 8% Since late 2013 her blood sugars had been much higher progressively increasing A1c which was consistently over 14%   Recent history Before her initial consultation she had been on various doses of Lantus insulin and until recently was taking 65 units twice a day without adequate control. Also she was taking only 4-6 units of NovoLog at mealtimes She was also symptomatic with the hypoglycemia Because of poor control with her mostly basal only regimen she was given U-500 insulin to start taking 3 times a day She has taken this as directed and is taking 12-14 units, usually 3 times a day although has taken only one dose today Lantus insulin was increased in the morning and she is on a total of 110 units a day For her insulin resistance Actos 30 mg was added With this her blood sugars appear to be significantly better although still relatively high at times Checking blood sugars somewhat inconsistently and not everyday       Oral hypoglycemic drugs the patient is taking are: Actos 30 mg      Side effects from medications have been: None INSULIN regimen is described as: Regular U-500: 12  tid a.c., Lantus 70--40  Glucose monitoring:  done 0.9 a times day         Glucometer:  Accu-Chek    Blood Glucose readings recently:   PREMEAL Breakfast Lunch  afternoon   evening  Overall  Glucose range:  97-234   176-233   112-262   89-275    Mean/median:  150      189    Hypoglycemia: None      Glycemic control:  Lab Results  Component Value Date   HGBA1C >14.0 06/04/2013   HGBA1C >14.0 01/22/2013   HGBA1C >14.0 08/04/2012   Lab Results  Component Value Date   MICROALBUR 0.50 06/04/2013   LDLCALC 139* 04/10/2012   CREATININE 0.65 06/18/2013    Self-care: The diet that the patient has been following is: tries to limit fat intake.      Meals: 2-3 meals per day.          Exercise:  occasionally walking 2-3/7 days a week         Dietician visit: Most recent: At diagnosis.               Compliance with the medical regimen:  Retinal exam: Most recent:2014    Weight history: Wt Readings from Last 3 Encounters:  07/15/13 209 lb 9.6 oz (95.074 kg)  06/28/13 204 lb (92.534 kg)  06/18/13 204 lb (92.534 kg)      Medication List       This list is accurate as of: 07/15/13 11:59 PM.  Always use your most recent med list.  ACCU-CHEK FASTCLIX LANCETS Misc  1 each by Does not apply route 3 (three) times daily. ICD 9 250.92     ACCU-CHEK NANO SMARTVIEW W/DEVICE Kit  1 Device by Does not apply route as needed.     albuterol 108 (90 BASE) MCG/ACT inhaler  Commonly known as:  PROVENTIL HFA;VENTOLIN HFA  Inhale 2 puffs into the lungs every 6 (six) hours as needed for wheezing.     ammonium lactate 12 % cream  Commonly known as:  LAC-HYDRIN  Apply topically as needed for dry skin.     aspirin 81 MG EC tablet  Take 81 mg by mouth daily.     atorvastatin 40 MG tablet  Commonly known as:  LIPITOR  Take 1 tablet (40 mg total) by mouth daily.     cetirizine 10 MG tablet  Commonly known as:  ZYRTEC  Take 1 tablet (10 mg total) by mouth at bedtime.     EPINEPHrine 0.3 mg/0.3 mL Devi  Commonly known as:  EPI-PEN  Inject 0.3 mLs (0.3 mg total) into the muscle as needed. For allergic reaction     fluconazole 150 MG tablet   Commonly known as:  DIFLUCAN  Take 1 tablet (150 mg total) by mouth every 3 (three) days.     gabapentin 300 MG capsule  Commonly known as:  NEURONTIN  Take 1 capsule (300 mg total) by mouth at bedtime.     glucose blood test strip  Commonly known as:  ACCU-CHEK SMARTVIEW  1 each by Other route 3 (three) times daily. ICD 9 250.92     Insulin Glargine 100 UNIT/ML Solostar Pen  Commonly known as:  LANTUS SOLOSTAR  70 U [place] every morning, 40 U  every afternoon     Insulin Pen Needle 31G X 8 MM Misc  Commonly known as:  PX SHORTLENGTH PEN NEEDLES  1 each by Does not apply route QID.     insulin regular human CONCENTRATED 500 UNIT/ML Soln injection  Commonly known as:  HUMULIN R  Inject 12 units three times a day with meals     INSULIN SYRINGE .3CC/31GX5/16" 31G X 5/16" 0.3 ML Misc  Use 3 needles per day     lisinopril 10 MG tablet  Commonly known as:  PRINIVIL,ZESTRIL  Take 1 tablet (10 mg total) by mouth daily.     lurasidone 40 MG Tabs tablet  Commonly known as:  LATUDA  Take 40 mg by mouth daily with breakfast.     mometasone 50 MCG/ACT nasal spray  Commonly known as:  NASONEX  Place 2 sprays into the nose daily.     pioglitazone 30 MG tablet  Commonly known as:  ACTOS  Take 1 tablet (30 mg total) by mouth daily.     ranitidine 150 MG tablet  Commonly known as:  ZANTAC  Take 1 tablet (150 mg total) by mouth 2 (two) times daily.        Allergies:  Allergies  Allergen Reactions  . Codeine     Patient stated that it slowed her heart rate  . Diphenhydramine Hcl     REACTION: hives  . Fexofenadine-Pseudoephed Er     Patient stated that is slowed her heart rate  . Peanut-Containing Drug Products     REACTION: mouth swelling  . Shellfish Allergy Itching    Past Medical History  Diagnosis Date  . Diabetes mellitus   . Renal disorder   . Kidney stones   . Hypertension   . Kidney  stones   . Hemorrhoids 07/10/2010    Past Surgical History  Procedure  Laterality Date  . Cholecystectomy    . Cesarean section    . Tubal ligation    . Fiborids removed      Family History  Problem Relation Age of Onset  . Hypertension Mother   . Diabetes Mother   . Cancer Mother     Social History:  reports that she has never smoked. She has never used smokeless tobacco. She reports that she does not drink alcohol or use illicit drugs.    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 Latest known visit with results is:  Office Visit on 06/18/2013  Component Date Value Ref Range Status  . Sodium 06/18/2013 133* 135 - 145 mEq/L Final  . Potassium 06/18/2013 4.1  3.5 - 5.3 mEq/L Final  . Chloride 06/18/2013 98  96 - 112 mEq/L Final  . CO2 06/18/2013 27  19 - 32 mEq/L Final  . Glucose, Bld 06/18/2013 432* 70 - 99 mg/dL Final  . BUN 06/18/2013 6  6 - 23 mg/dL Final  . Creat 06/18/2013 0.65  0.50 - 1.10 mg/dL Final  . Calcium 06/18/2013 8.5  8.4 - 10.5 mg/dL Final    Physical Examination:  BP 111/90  Pulse 87  Temp(Src) 98.3 F (36.8 C)  Resp 16  Ht 4' 11"  (1.499 m)  Wt 209 lb 9.6 oz (95.074 kg)  BMI 42.31 kg/m2  SpO2 96%   No pedal edema present  ASSESSMENT:  Diabetes type 2, uncontrolled     She has had significant improvement in her blood sugars with using a regimen of regular U-500 insulin along with increased doses of  Lantus Although she has just started Actos she may be benefiting from this also See history of present illness for detailed discussion of her current blood sugar patterns, management, problems identified She had been relatively compliant with her insulin but not with glucose monitoring and can do more exercise She does need better diabetes education and information on meal planning along with more efforts to lose weight Again discussed timing and action of both Lantus and U-500 insulin, blood sugar targets and frequency of monitoring, exercise, balanced meals  History of mild hypertension: Her blood pressure is higher and she has not taken her lisinopril Advised her to start taking this  Hyperlipidemia: No recent levels available and will need to have lipids checked when blood sugars are better controlled  PLAN:   Will adjust her insulin further as in patient instructions Diabetes education  Patient Instructions  U-500 insulin: 15 units in am, 12 at lunch and 15 at supper; add 2 more units if over 200  Check sugar at least 2 x daily  Restart Lisinopril    Counseling time over 50% of today's 25 minute visit    Margaret Larsen 07/16/2013, 8:52 AM   Note: This office note was prepared with Estate agent. Any transcriptional errors that result from this process are unintentional.

## 2013-08-09 ENCOUNTER — Other Ambulatory Visit (INDEPENDENT_AMBULATORY_CARE_PROVIDER_SITE_OTHER): Payer: Medicaid Other

## 2013-08-09 DIAGNOSIS — E118 Type 2 diabetes mellitus with unspecified complications: Principal | ICD-10-CM

## 2013-08-09 DIAGNOSIS — E1165 Type 2 diabetes mellitus with hyperglycemia: Secondary | ICD-10-CM

## 2013-08-09 DIAGNOSIS — IMO0002 Reserved for concepts with insufficient information to code with codable children: Secondary | ICD-10-CM

## 2013-08-09 DIAGNOSIS — E785 Hyperlipidemia, unspecified: Secondary | ICD-10-CM

## 2013-08-09 LAB — URINALYSIS, ROUTINE W REFLEX MICROSCOPIC
BILIRUBIN URINE: NEGATIVE
Hgb urine dipstick: NEGATIVE
Ketones, ur: NEGATIVE
Leukocytes, UA: NEGATIVE
NITRITE: NEGATIVE
PH: 7 (ref 5.0–8.0)
RBC / HPF: NONE SEEN (ref 0–?)
Specific Gravity, Urine: <=1.005 — AB (ref 1.000–1.030)
TOTAL PROTEIN, URINE-UPE24: NEGATIVE
Urine Glucose: NEGATIVE
Urobilinogen, UA: 0.2 (ref 0.0–1.0)
WBC UA: NONE SEEN (ref 0–?)

## 2013-08-09 LAB — MICROALBUMIN / CREATININE URINE RATIO
Creatinine,U: 42.7 mg/dL
Microalb Creat Ratio: 0.5 mg/g (ref 0.0–30.0)
Microalb, Ur: 0.2 mg/dL (ref 0.0–1.9)

## 2013-08-09 LAB — BASIC METABOLIC PANEL
BUN: 5 mg/dL — ABNORMAL LOW (ref 6–23)
CHLORIDE: 98 meq/L (ref 96–112)
CO2: 28 mEq/L (ref 19–32)
Calcium: 8.7 mg/dL (ref 8.4–10.5)
Creatinine, Ser: 0.6 mg/dL (ref 0.4–1.2)
GFR: 138.94 mL/min (ref >60.00)
GLUCOSE: 209 mg/dL — AB (ref 70–99)
POTASSIUM: 3.7 meq/L (ref 3.5–5.1)
Sodium: 131 mEq/L — ABNORMAL LOW (ref 135–145)

## 2013-08-09 LAB — LIPID PANEL
Cholesterol: 194 mg/dL (ref 0–200)
HDL: 45.4 mg/dL (ref >39.00)
LDL Cholesterol: 121 mg/dL — ABNORMAL HIGH (ref 0–99)
NONHDL: 148.6
Total CHOL/HDL Ratio: 4
Triglycerides: 138 mg/dL (ref 0.0–149.0)
VLDL: 27.6 mg/dL (ref 0.0–40.0)

## 2013-08-09 LAB — HEMOGLOBIN A1C: Hgb A1c MFr Bld: 12.3 % — ABNORMAL HIGH (ref 4.6–6.5)

## 2013-08-12 ENCOUNTER — Encounter: Payer: Self-pay | Admitting: Endocrinology

## 2013-08-12 ENCOUNTER — Ambulatory Visit (INDEPENDENT_AMBULATORY_CARE_PROVIDER_SITE_OTHER): Payer: Medicaid Other | Admitting: Endocrinology

## 2013-08-12 VITALS — BP 117/85 | HR 85 | Temp 97.8°F | Resp 16 | Ht 59.0 in | Wt 214.2 lb

## 2013-08-12 DIAGNOSIS — E785 Hyperlipidemia, unspecified: Secondary | ICD-10-CM

## 2013-08-12 DIAGNOSIS — E1165 Type 2 diabetes mellitus with hyperglycemia: Secondary | ICD-10-CM

## 2013-08-12 DIAGNOSIS — E118 Type 2 diabetes mellitus with unspecified complications: Principal | ICD-10-CM

## 2013-08-12 DIAGNOSIS — IMO0002 Reserved for concepts with insufficient information to code with codable children: Secondary | ICD-10-CM

## 2013-08-12 DIAGNOSIS — I10 Essential (primary) hypertension: Secondary | ICD-10-CM

## 2013-08-12 NOTE — Progress Notes (Signed)
Patient ID: Margaret Larsen, female   DOB: 1970-01-13, 43 y.o.   MRN: [id number]    Reason for Appointment: Followup for  Diabetes  Referring physician: Funches  History of Present Illness:          Diagnosis: Type 2 diabetes mellitus, date of diagnosis: 2010      Past history:  Her blood sugar was about 500 at the time of diagnosis and she was symptomatic with hypoglycemia She was tried on oral hypoglycemic drugs for a short time and these were not effective. She thinks metformin caused diarrhea and was not continued She has been on various insulin regimens since her diagnosis Blood sugars were only mildly increased in 2012-2013 with A1c at 8% Since late 2013 her blood sugars had been much higher progressively increasing A1c which was consistently over 14%  Before her initial consultation she had been on various doses of Lantus insulin and  was taking 65 units twice a day without adequate control. Also she was taking only 4-6 units of NovoLog at mealtimes  Recent history shaky with u 500 1-2 hrs later, Because of poor control with her mostly basal only regimen she was given U-500 insulin to start taking 3 times a day Her blood sugars were somewhat better but because of persistently high readings she was told to increase the dose but she has not done so She says that when she takes the U-500 insulin she will get shaky about 1-2 hours later and will have to eat. However did not have any documented low blood sugars, lowest glucose is 79 Also she is not taking this consistently 3 times a day and frequently only twice a day based on when she is eating Lantus insulin has been increased in the morning and she is on a total of 110 units a day For her insulin resistance Actos 30 mg was added Current blood sugar patterns and problems:  Fasting readings are variable and significantly high in the last 10 days: Otherwise has been as low as 95  Not checking blood sugars consistently and skipping  days in between  Blood sugars are variable midday and evening  Has a couple of low normal blood sugars at about 10:30 PM  No documented hypoglycemia, lowest blood sugar at 10 PM      Oral hypoglycemic drugs the patient is taking are: Actos 30 mg      Side effects from medications have been: None INSULIN regimen is described as: Regular U-500: 12  tid a.c., Lantus 70--40  Glucose monitoring:  done 0.9 a times day         Glucometer:  Accu-Chek    Blood Glucose readings recently:   PREMEAL Breakfast Lunch Dinner  7-11 PM  Overall  Glucose range:  95-300   134-229   190   79-250    Mean/median:  180     187   Glycemic control:   Lab Results  Component Value Date   HGBA1C 12.3* 08/09/2013   HGBA1C >14.0 06/04/2013   HGBA1C >14.0 01/22/2013   Lab Results  Component Value Date   MICROALBUR 0.2 08/09/2013   LDLCALC 121* 08/09/2013   CREATININE 0.6 08/09/2013    Self-care: The diet that the patient has been following is: tries to limit fat intake.      Meals: 2-3 meals per day. Bfst egg toast         Exercise:   walking 2-3/7 days a week  Dietician visit: Most recent: At diagnosis.               Compliance with the medical regimen:  Retinal exam: Most recent:2014    Weight history: Wt Readings from Last 3 Encounters:  08/12/13 214 lb 3.2 oz (97.16 kg)  07/15/13 209 lb 9.6 oz (95.074 kg)  06/28/13 204 lb (92.534 kg)      Medication List       This list is accurate as of: 08/12/13  5:08 PM.  Always use your most recent med list.               ACCU-CHEK FASTCLIX LANCETS Misc  1 each by Does not apply route 3 (three) times daily. ICD 9 250.92     ACCU-CHEK NANO SMARTVIEW W/DEVICE Kit  1 Device by Does not apply route as needed.     albuterol 108 (90 BASE) MCG/ACT inhaler  Commonly known as:  PROVENTIL HFA;VENTOLIN HFA  Inhale 2 puffs into the lungs every 6 (six) hours as needed for wheezing.     ammonium lactate 12 % cream  Commonly known as:  LAC-HYDRIN  Apply  topically as needed for dry skin.     aspirin 81 MG EC tablet  Take 81 mg by mouth daily.     atorvastatin 40 MG tablet  Commonly known as:  LIPITOR  Take 1 tablet (40 mg total) by mouth daily.     cetirizine 10 MG tablet  Commonly known as:  ZYRTEC  Take 1 tablet (10 mg total) by mouth at bedtime.     EPINEPHrine 0.3 mg/0.3 mL Devi  Commonly known as:  EPI-PEN  Inject 0.3 mLs (0.3 mg total) into the muscle as needed. For allergic reaction     fluconazole 150 MG tablet  Commonly known as:  DIFLUCAN  Take 1 tablet (150 mg total) by mouth every 3 (three) days.     gabapentin 300 MG capsule  Commonly known as:  NEURONTIN  Take 1 capsule (300 mg total) by mouth at bedtime.     glucose blood test strip  Commonly known as:  ACCU-CHEK SMARTVIEW  1 each by Other route 3 (three) times daily. ICD 9 250.92     Insulin Glargine 100 UNIT/ML Solostar Pen  Commonly known as:  LANTUS SOLOSTAR  70 U [place] every morning, 40 U [place] every afternoon     Insulin Pen Needle 31G X 8 MM Misc  Commonly known as:  PX SHORTLENGTH PEN NEEDLES  1 each by Does not apply route QID.     insulin regular human CONCENTRATED 500 UNIT/ML Soln injection  Commonly known as:  HUMULIN R  Inject 12 units three times a day with meals     INSULIN SYRINGE .3CC/31GX5/16" 31G X 5/16" 0.3 ML Misc  Use 3 needles per day     lisinopril 10 MG tablet  Commonly known as:  PRINIVIL,ZESTRIL  Take 1 tablet (10 mg total) by mouth daily.     lurasidone 40 MG Tabs tablet  Commonly known as:  LATUDA  Take 40 mg by mouth daily with breakfast.     mometasone 50 MCG/ACT nasal spray  Commonly known as:  NASONEX  Place 2 sprays into the nose daily.     pioglitazone 30 MG tablet  Commonly known as:  ACTOS  Take 1 tablet (30 mg total) by mouth daily.     ranitidine 150 MG tablet  Commonly known as:  ZANTAC  Take 1 tablet (150 mg total)  by mouth 2 (two) times daily.        Allergies:  Allergies  Allergen Reactions  .  Codeine     Patient stated that it slowed her heart rate  . Diphenhydramine Hcl     REACTION: hives  . Fexofenadine-Pseudoephed Er     Patient stated that is slowed her heart rate  . Peanut-Containing Drug Products     REACTION: mouth swelling  . Shellfish Allergy Itching    Past Medical History  Diagnosis Date  . Diabetes mellitus   . Renal disorder   . Kidney stones   . Hypertension   . Kidney stones   . Hemorrhoids 07/10/2010    Past Surgical History  Procedure Laterality Date  . Cholecystectomy    . Cesarean section    . Tubal ligation    . Fiborids removed      Family History  Problem Relation Age of Onset  . Hypertension Mother   . Diabetes Mother   . Cancer Mother     Social History:  reports that she has never smoked. She has never used smokeless tobacco. She reports that she does not drink alcohol or use illicit drugs.    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 Hemoglobin A1C 08/09/2013 12.3* 4.6 - 6.5 % Final   Glycemic Control Guidelines for People with Diabetes:Non Diabetic:  <6%Goal of Therapy: <7%Additional Action Suggested:  >8%   . Sodium 08/09/2013 131* 135 - 145 mEq/L Final  . Potassium 08/09/2013 3.7  3.5 - 5.1 mEq/L Final  . Chloride 08/09/2013 98  96 - 112 mEq/L Final  . CO2 08/09/2013 28  19 - 32 mEq/L Final  . Glucose, Bld 08/09/2013 209* 70 - 99  mg/dL Final  . BUN 08/09/2013 5* 6 - 23 mg/dL Final  . Creatinine, Ser 08/09/2013 0.6  0.4 - 1.2 mg/dL Final  . Calcium 08/09/2013 8.7  8.4 - 10.5 mg/dL Final  . GFR 08/09/2013 138.94  >60.00 mL/min Final  . Cholesterol 08/09/2013 194  0 - 200 mg/dL Final   ATP III Classification       Desirable:  < 200 mg/dL               Borderline High:  200 - 239 mg/dL          High:  > = 240 mg/dL  . Triglycerides 08/09/2013 138.0  0.0 - 149.0 mg/dL Final   Normal:  <150 mg/dLBorderline High:  150 - 199 mg/dL  . HDL 08/09/2013 45.40  >39.00 mg/dL Final  . VLDL 08/09/2013 27.6  0.0 - 40.0 mg/dL Final  . LDL Cholesterol 08/09/2013 121* 0 - 99 mg/dL Final  . Total CHOL/HDL Ratio 08/09/2013 4   Final  Men          Women1/2 Average Risk     3.4          3.3Average Risk          5.0          4.42X Average Risk          9.6          7.13X Average Risk          15.0          11.0                      . NonHDL 08/09/2013 148.60   Final   NOTE:  Non-HDL goal should be 30 mg/dL higher than patient's LDL goal (i.e. LDL goal of < 70 mg/dL, would have non-HDL goal of < 100 mg/dL)  . Microalb, Ur 08/09/2013 0.2  0.0 - 1.9 mg/dL Final  . Creatinine,U 08/09/2013 42.7   Final  . Microalb Creat Ratio 08/09/2013 0.5  0.0 - 30.0 mg/g Final  . Color, Urine 08/09/2013 YELLOW  Yellow;Lt. Yellow Final  . APPearance 08/09/2013 CLEAR  Clear Final  . Specific Gravity, Urine 08/09/2013 <=1.005* 1.000 - 1.030 Final  . pH 08/09/2013 7.0  5.0 - 8.0 Final  . Total Protein, Urine 08/09/2013 NEGATIVE  Negative Final  . Urine Glucose 08/09/2013 NEGATIVE  Negative Final  . Ketones, ur 08/09/2013 NEGATIVE  Negative Final  . Bilirubin Urine 08/09/2013 NEGATIVE  Negative Final  . Hgb urine dipstick 08/09/2013 NEGATIVE  Negative Final  . Urobilinogen, UA 08/09/2013 0.2  0.0 - 1.0 Final  . Leukocytes, UA 08/09/2013 NEGATIVE  Negative Final  . Nitrite 08/09/2013 NEGATIVE  Negative Final  . WBC, UA 08/09/2013 none seen   0-2/hpf Final  . RBC / HPF 08/09/2013 none seen  0-2/hpf Final  . Squamous Epithelial / LPF 08/09/2013 Few(5-10/hpf)* Rare(0-4/hpf) Final    Physical Examination:  BP 117/85  Pulse 85  Temp(Src) 97.8 F (36.6 C)  Resp 16  Ht $R'4\' 11"'Fj$  (1.499 m)  Wt 214 lb 3.2 oz (97.16 kg)  BMI 43.24 kg/m2  SpO2 98%   No  edema present  ASSESSMENT:  Diabetes type 2, uncontrolled     She has had  improvement in her blood sugars with using a regimen of regular U-500 insulin along with increased doses of Lantus However blood sugars are inconsistent and still overall averaging about 180 She has started gaining weight Not clear why she feels hypoglycemic about 90 minutes after taking U-500 insulin even without any documented low sugars See history of present illness for detailed discussion of her current blood sugar patterns and management Not clear why recent fasting readings are significantly high; however maybe taking the U-500 insulin only twice a day  Hyperlipidemia: No recent levels available and will need to have lipids checked when blood sugars are better controlled  PLAN:   Will adjust her insulin further as in patient instructions She will try to take her U-500 insulin 3 times a day at reduced doses She will benefit from a drug like Invokana Discussed action of SGLT 2 drugs on lowering glucose by decreasing kidney absorption of glucose, benefits of weight loss and lower blood pressure, possible side effects including candidiasis and dosage regimen  She can take Diflucan if she has any candidiasis  Need to check on her compliance with Lipitor  Patient Instructions  Lantus 50 units twice daily  If sugar in am  stays > 200 then go to 60 Lantus in pm  U-500: take on waking up; if eating Bfst take 10 otherwise 6 units Take 8 units one hour before meals  Invokana in am   Walk daily   Counseling time over 50% of today's 25 minute visit    Rudy Luhmann 08/12/2013, 5:08 PM   Note: This  office note was prepared with Estate agent. Any transcriptional errors that result from this process are unintentional.

## 2013-08-12 NOTE — Patient Instructions (Addendum)
Lantus 50 units twice daily  If sugar in am stays > 200 then go to 60 Lantus in pm  U-500: take on waking up; if eating Bfst take 10 otherwise 6 units Take 8 units one hour before meals  Invokana in am   Walk daily

## 2013-08-23 ENCOUNTER — Telehealth: Payer: Self-pay | Admitting: Family Medicine

## 2013-08-23 NOTE — Telephone Encounter (Signed)
Pt requesting a letter for work stating that she has diabetic neuropathy and that she is unable to stand for prolong periods for time. Pls call pt for pick up.

## 2013-08-23 NOTE — Telephone Encounter (Signed)
Note to nursing staff - please have patient schedule an appointment to discuss this matter, thanks

## 2013-08-23 NOTE — Telephone Encounter (Signed)
Pt scheduled to see dr. Josephina Gip on Wednesday at 8:45 am. Delray Alt CMA

## 2013-08-25 ENCOUNTER — Ambulatory Visit (INDEPENDENT_AMBULATORY_CARE_PROVIDER_SITE_OTHER): Payer: Medicaid Other | Admitting: Family Medicine

## 2013-08-25 ENCOUNTER — Encounter: Payer: Self-pay | Admitting: Family Medicine

## 2013-08-25 VITALS — BP 126/84 | HR 89 | Temp 98.3°F | Ht 59.0 in | Wt 216.3 lb

## 2013-08-25 DIAGNOSIS — G5711 Meralgia paresthetica, right lower limb: Secondary | ICD-10-CM

## 2013-08-25 DIAGNOSIS — E1149 Type 2 diabetes mellitus with other diabetic neurological complication: Secondary | ICD-10-CM

## 2013-08-25 DIAGNOSIS — G5710 Meralgia paresthetica, unspecified lower limb: Secondary | ICD-10-CM

## 2013-08-25 MED ORDER — GABAPENTIN 300 MG PO CAPS: ORAL_CAPSULE | ORAL | Status: DC

## 2013-08-25 NOTE — Progress Notes (Signed)
   Subjective:    Patient ID: Margaret Larsen, female    DOB: 07/10/1970, 43 y.o.   MRN: [id number]  HPI Pt presents for diabetic neuropathy requesting work notes. Reports severe burning/stinging pain in feet and lower legs, worse after standing for prolonged periods. She used to be able to sit at work during down times and could tolerate that but now they got rid of the chair. She takes gabapentin at night but so far has not noticed it helping any.   Review of Systems See HPI    Objective:   Physical Exam  Nursing note and vitals reviewed. Constitutional: She is oriented to person, place, and time. She appears well-developed and well-nourished. No distress.  HENT:  Head: Normocephalic and atraumatic.  Eyes: Conjunctivae are normal. Right eye exhibits no discharge. Left eye exhibits no discharge. No scleral icterus.  Cardiovascular: Normal rate.   Pulmonary/Chest: Effort normal.  Neurological: She is alert and oriented to person, place, and time.  Skin: Skin is warm and dry. She is not diaphoretic.  Psychiatric: She has a normal mood and affect. Her behavior is normal.          Assessment & Plan:

## 2013-08-25 NOTE — Assessment & Plan Note (Signed)
Pain worse with standing for prolonged periods, needs note saying she can sit down at work. Reports gabapentin isn't working - work note written - increased gabapentin from 300 qhs to 600qhs and 300qam

## 2013-08-25 NOTE — Patient Instructions (Signed)
I have increased your gabapentin dose to 2 pills at night and 1 in the morning to help with your neuropathy. Please follow up with your PCP for further adjustment of your medicines as needed. Thank you.

## 2013-11-16 ENCOUNTER — Ambulatory Visit (INDEPENDENT_AMBULATORY_CARE_PROVIDER_SITE_OTHER): Payer: Medicaid Other | Admitting: Family Medicine

## 2013-11-16 ENCOUNTER — Encounter: Payer: Self-pay | Admitting: Family Medicine

## 2013-11-16 VITALS — BP 119/77 | HR 98 | Temp 98.2°F | Ht 59.0 in | Wt 215.4 lb

## 2013-11-16 DIAGNOSIS — IMO0002 Reserved for concepts with insufficient information to code with codable children: Secondary | ICD-10-CM

## 2013-11-16 DIAGNOSIS — E1165 Type 2 diabetes mellitus with hyperglycemia: Secondary | ICD-10-CM

## 2013-11-16 DIAGNOSIS — E118 Type 2 diabetes mellitus with unspecified complications: Secondary | ICD-10-CM

## 2013-11-16 DIAGNOSIS — L299 Pruritus, unspecified: Secondary | ICD-10-CM

## 2013-11-16 MED ORDER — DESONIDE 0.05 % EX CREA: TOPICAL_CREAM | Freq: Two times a day (BID) | CUTANEOUS | Status: DC

## 2013-11-16 MED ORDER — PREDNISONE (PAK) 10 MG PO TABS: ORAL_TABLET | Freq: Every day | ORAL | Status: DC

## 2013-11-16 MED ORDER — HYDROXYZINE HCL 25 MG PO TABS: 25.0000 mg | ORAL_TABLET | Freq: Two times a day (BID) | ORAL | Status: DC | PRN

## 2013-11-16 NOTE — Patient Instructions (Signed)
Take the Atarax when you have trouble itching.  Take the Prednisone as prescribed.  This will make your sugars increase in the short term.  Once this has stopped, you can start using the cream to help the rash go away.  It was good to meet you today

## 2013-11-16 NOTE — Assessment & Plan Note (Signed)
Unclear etiology.  No clear triggering factors.   No rash noted. Atarax for relief. Pred-pak to calm down rash, what appears to be eczema. Desonide for relief after stopping Pred-pak. Warned her this will worsen diabetes.   Of note, her blood sugars aren't great -- she is unclear what she should be taking for diabetes medicine.

## 2013-11-16 NOTE — Progress Notes (Signed)
Subjective:    Margaret Larsen is a 43 y.o. female who presents to [facility] today for itching:  1.  Itching:  Present for about 2 weeks.  Started on arms, pretty quickly progressed to back and upper chest.  Also legs.  Has noted dry scaly rash on back and legs.  No lesoins on arms.  No new lotions, soaps, shampoos, contacts.  No one else at home with itching.  Has not tried anything for relief.   Does endorse that her diabetes is out of control.  Unsure what meds she is taking for this.    ROS as above per HPI, otherwise neg.   The following portions of the patient's history were reviewed and updated as appropriate: allergies, current medications, past medical history, family and social history, and problem list. Patient is a nonsmoker.    PMH reviewed.  Past Medical History  Diagnosis Date  . Diabetes mellitus   . Renal disorder   . Kidney stones   . Hypertension   . Kidney stones   . Hemorrhoids 07/10/2010   Past Surgical History  Procedure Laterality Date  . Cholecystectomy    . Cesarean section    . Tubal ligation    . Fiborids removed      Medications reviewed. Current Outpatient Prescriptions  Medication Sig Dispense Refill  . ACCU-CHEK FASTCLIX LANCETS MISC 1 each by Does not apply route 3 (three) times daily. ICD 9 250.92 102 each 11  . albuterol (PROVENTIL HFA;VENTOLIN HFA) 108 (90 BASE) MCG/ACT inhaler Inhale 2 puffs into the lungs every 6 (six) hours as needed for wheezing. 1 Inhaler 3  . ammonium lactate (LAC-HYDRIN) 12 % cream Apply topically as needed for dry skin. 385 g 0  . aspirin 81 MG EC tablet Take 81 mg by mouth daily.     Marland Kitchen atorvastatin (LIPITOR) 40 MG tablet Take 1 tablet (40 mg total) by mouth daily. 90 tablet 3  . Blood Glucose Monitoring Suppl (ACCU-CHEK NANO SMARTVIEW) W/DEVICE KIT 1 Device by Does not apply route as needed. 1 kit 0  . cetirizine (ZYRTEC) 10 MG tablet Take 1 tablet (10 mg total) by mouth at bedtime. 90 tablet 3  . EPINEPHrine (EPI-PEN)  0.3 mg/0.3 mL DEVI Inject 0.3 mLs (0.3 mg total) into the muscle as needed. For allergic reaction 1 Device 0  . fluconazole (DIFLUCAN) 150 MG tablet Take 1 tablet (150 mg total) by mouth every 3 (three) days. 2 tablet 3  . gabapentin (NEURONTIN) 300 MG capsule Take 2 tablets ($RemoveBe'600mg'bbSdbzPyF$ ) at bedtime and 1 tablet ($RemoveB'300mg'IJpdGMIf$ ) in the morning 90 capsule 12  . glucose blood (ACCU-CHEK SMARTVIEW) test strip 1 each by Other route 3 (three) times daily. ICD 9 250.92 100 each 11  . Insulin Glargine (LANTUS SOLOSTAR) 100 UNIT/ML Solostar Pen 70 U [place] every morning, 40 U [place] every afternoon 11 pen 3  . Insulin Pen Needle (PX SHORTLENGTH PEN NEEDLES) 31G X 8 MM MISC 1 each by Does not apply route QID. 120 each 11  . insulin regular human CONCENTRATED (HUMULIN R) 500 UNIT/ML SOLN injection Inject 12 units three times a day with meals 20 mL 1  . Insulin Syringe-Needle U-100 (INSULIN SYRINGE .3CC/31GX5/16") 31G X 5/16" 0.3 ML MISC Use 3 needles per day 100 each 3  . lisinopril (PRINIVIL,ZESTRIL) 10 MG tablet Take 1 tablet (10 mg total) by mouth daily. 90 tablet 3  . lurasidone (LATUDA) 40 MG TABS Take 40 mg by mouth daily with breakfast.    .  mometasone (NASONEX) 50 MCG/ACT nasal spray Place 2 sprays into the nose daily. 17 g 12  . pioglitazone (ACTOS) 30 MG tablet Take 1 tablet (30 mg total) by mouth daily. 30 tablet 3  . ranitidine (ZANTAC) 150 MG tablet Take 1 tablet (150 mg total) by mouth 2 (two) times daily. 180 tablet 3   No current facility-administered medications for this visit.     Objective:   Physical Exam BP 119/77 mmHg  Pulse 98  Temp(Src) 98.2 F (36.8 C) (Oral)  Ht 4' 11"  (1.499 m)  Wt 215 lb 6.4 oz (97.705 kg)  BMI 43.48 kg/m2 Gen:  Alert, cooperative patient who appears stated age in no acute distress.  Vital signs reviewed. Skin:  Few scattered scaly rashes BL LE's.  2 cattered on back.  Some excoriations noted on upper chest.  No lesions on arms.     No results found for this or any previous  visit (from the past 72 hour(s)).

## 2013-12-01 ENCOUNTER — Ambulatory Visit (INDEPENDENT_AMBULATORY_CARE_PROVIDER_SITE_OTHER): Payer: Medicaid Other | Admitting: Family Medicine

## 2013-12-01 ENCOUNTER — Encounter: Payer: Self-pay | Admitting: Family Medicine

## 2013-12-01 VITALS — BP 116/79 | HR 92 | Temp 98.0°F | Ht 59.0 in | Wt 213.0 lb

## 2013-12-01 DIAGNOSIS — S91301A Unspecified open wound, right foot, initial encounter: Secondary | ICD-10-CM | POA: Insufficient documentation

## 2013-12-01 NOTE — Assessment & Plan Note (Signed)
Superficial wound from peeling skin. Granulation tissue present. Does not appear infected. Wound dressed today. Follow-up as needed.

## 2013-12-01 NOTE — Progress Notes (Signed)
   Subjective:    Patient ID: Margaret Larsen, female    DOB: 1970-07-25, 43 y.o.   MRN: [id number]  HPI 43 year old female with uncontrolled diabetes, hyperlipidemia, and hypertension presents for same day appointment with complaints of peeling of skin of right foot.  1) Right Foot - Skin peeling  She reports that the skin of the sole of her right foot has recently started peeling.  She states that she has an open wound of the sole of right foot.  She denies any recent exposures, fall, trauma, injury.     She states that the area is painful particularly with ambulation.   No associated itching.  No recent fever, chills, nausea, vomiting.  No reported drainage from the area.   Review of Systems Per HPI    Objective:   Physical Exam Filed Vitals:   12/01/13 1358  BP: 116/79  Pulse: 92  Temp: 98 F (36.7 C)   General: Well-appearing obese female in no acute distress. Skin: Right foot -  plantar aspect of the heel with a linear small superficial wound noted.  Granulation tissue present.  No warmth or surrounding erythema. No drainage.      Assessment & Plan:   See problem list

## 2013-12-17 ENCOUNTER — Telehealth: Payer: Self-pay | Admitting: *Deleted

## 2013-12-17 ENCOUNTER — Other Ambulatory Visit: Payer: Self-pay | Admitting: Family Medicine

## 2013-12-17 DIAGNOSIS — E1165 Type 2 diabetes mellitus with hyperglycemia: Secondary | ICD-10-CM

## 2013-12-17 DIAGNOSIS — IMO0002 Reserved for concepts with insufficient information to code with codable children: Secondary | ICD-10-CM

## 2013-12-17 DIAGNOSIS — E118 Type 2 diabetes mellitus with unspecified complications: Principal | ICD-10-CM

## 2013-12-17 MED ORDER — INSULIN ASPART 100 UNIT/ML FLEXPEN: PEN_INJECTOR | SUBCUTANEOUS | Status: DC

## 2013-12-17 NOTE — Telephone Encounter (Signed)
Is completely out of her Novalog. Pharmacy says there are not more refills Has been out several days

## 2013-12-17 NOTE — Telephone Encounter (Signed)
Received a fax from [facility] needing to verify the directions.  The directions stated inject as directed three times per day.  Derl Barrow, RN

## 2013-12-17 NOTE — Telephone Encounter (Signed)
Patient called to request refill of Novolog, reviewed chart and patient not currently on Novolog, current insulin regimen (based on med list and most recent endocrine notes) listed as Lantus 70 units AM and 40 units PM plus U500 10/8/8, patient no longer taking U500, taking Novolog 4 units with meals + sliding scale (she is unable to tell me the ISS), current blood sugars in the 300-400 range, she requests to NOT return to endocrine as "she did not like it there", I offered her an appointment to discuss diabetes and will refill Novolog long enough to get her to her appointment.  Note to nursing staff - please schedule appointment to discuss diabetes

## 2013-12-17 NOTE — Telephone Encounter (Signed)
Note to nursing - please call pharmacy, clarify that patient is to inject 4 units before meals

## 2013-12-21 NOTE — Telephone Encounter (Signed)
Spoke with patient, scheduled appointment with Dr. Venetia Maxon as Dr. Ree Kida not available until after the first on the year and patient says she could not wait that long. Patient asking to be changed to another provider with more availability, informed patient I would send a message to our Asst director. Will forward to [place].

## 2014-01-05 ENCOUNTER — Ambulatory Visit (INDEPENDENT_AMBULATORY_CARE_PROVIDER_SITE_OTHER): Payer: Medicaid Other | Admitting: Family Medicine

## 2014-01-05 ENCOUNTER — Encounter: Payer: Self-pay | Admitting: Family Medicine

## 2014-01-05 VITALS — BP 117/76 | HR 96 | Temp 98.4°F | Ht 59.0 in | Wt 208.0 lb

## 2014-01-05 DIAGNOSIS — IMO0002 Reserved for concepts with insufficient information to code with codable children: Secondary | ICD-10-CM

## 2014-01-05 DIAGNOSIS — E118 Type 2 diabetes mellitus with unspecified complications: Secondary | ICD-10-CM

## 2014-01-05 DIAGNOSIS — E1165 Type 2 diabetes mellitus with hyperglycemia: Secondary | ICD-10-CM

## 2014-01-05 LAB — POCT GLYCOSYLATED HEMOGLOBIN (HGB A1C): Hemoglobin A1C: 13.4

## 2014-01-05 MED ORDER — INSULIN GLARGINE 100 UNIT/ML SOLOSTAR PEN: PEN_INJECTOR | SUBCUTANEOUS | Status: DC

## 2014-01-05 MED ORDER — FLUCONAZOLE 150 MG PO TABS: 150.0000 mg | ORAL_TABLET | ORAL | Status: DC

## 2014-01-05 MED ORDER — PIOGLITAZONE HCL 30 MG PO TABS: 30.0000 mg | ORAL_TABLET | Freq: Every day | ORAL | Status: DC

## 2014-01-05 NOTE — Assessment & Plan Note (Signed)
A: Very poor control with A1c >13 and overall very poor insight into diabetes in general and her own care plan, in specific. Pt has seen endocrinology in the past but seems unwilling to see them again "unless it's necessary." Pt has been on a variety of medications including different insulins and metformin (which she did not tolerate). Reports compliance with Lantus 70 units BID and Novolog TID, but not with Actos, and describes taking insulin in conflicting ways (drawing insulin out of the pens, using the pens themselves, not picking things up because "the pharmacy didn't call her," etc).  Unclear if pt is compliant with any other medications from vague history.   P: Refilled Lantus and Novolog pens, today, as well as sent in Rx for Actos.  Re-discussed Invokana, as well, but will defer Rx for this, for now. Rx for Diflucan for recurrent yeast infections.  F/u with Dr. Ree Kida in about 1 month. Also suggested f/u with Dr. Valentina Lucks for diabetes teaching, and instructed pt to bring in her medications if she does this.

## 2014-01-05 NOTE — Patient Instructions (Signed)
Thank you for coming in, today!  Take Lantus 70 units twice per day. Take Novolog 4 units before meals. Start taking pioglitazone (Actos) 30 mg once a day to help the insulin. You can take Diflucan as needed for yeast infections.  Come back to see Dr. Ree Kida in about 1 month. He can help adjust medicines further. He may also recommend that you come in to see Dr. Valentina Lucks (our pharmacist). Please feel free to call with any questions or concerns at any time, at [phone]. --Dr. Venetia Maxon

## 2014-01-05 NOTE — Progress Notes (Signed)
   Subjective:    Patient ID: Margaret Larsen, female    DOB: [birth year], 43 y.o.   MRN: [id number]  HPI: Pt presents to clinic for f/u of DM.  DM:  - reports compliance with Lantus 70 units twice a day and Novolog 4 units three times per day with meals - does admit to missing 1-2 doses of insulin per week - pt does states she "dials her insulin pen up then draws it out into a regular syringe" because the pens "get stuck" - reports CBG checks 2-3 times per day, varies widely 200's to 300's or higher, lowest 90's - previously saw Dr. Dwyane Dee but would prefer to stay with [place] if possible - had discussed starting Invokana with Dr. Dwyane Dee but it was not ordered / she did not start it - does report recurrent yeast infections and requests refill on Diflucan (previously had standing Rx from Dr. Adrian Blackwater) - states she needs refills of Lantus and Novolog - has been on U-500 concentrated regular insulin, which gave her hypoglycemic - has been on metformin in the past, with intolerable GI upset - has been on Actos in the past but reports no longer taking it - reports no specific exercise program - states she drinks diet soft drinks, water; does report significant thirst with high blood sugars  Of note, pt has a hx of HLD and diabetic neuropathy but reports she does not take gabapentin or Lipitor, as are on her med list. Overall, pt is very vague about her medication regimen in general and appears to have poor insight to her prescriptions.  Review of Systems: As above. Denies fevers / chills, coryza-type symptoms, N/V, abdominal pain.     Objective:   Physical Exam BP 117/76 mmHg  Pulse 96  Temp(Src) 98.4 F (36.9 C) (Oral)  Ht 4\' 11"  (1.499 m)  Wt 208 lb (94.348 kg)  BMI 41.99 kg/m2 Gen: well-appearing adult female in NAD HEENT: [place]/AT, EOMI, PERRLA, MMM Cardio: RRR, no murmur Pulm: CTAB, no wheezes Abd: soft, nontender; obese; BS+ Ext: warm, well-perfused, no LE edema GU: declined exam     Assessment & Plan:  See problem list note

## 2014-02-28 ENCOUNTER — Telehealth: Payer: Self-pay | Admitting: Family Medicine

## 2014-02-28 NOTE — Telephone Encounter (Signed)
Pt stated she had completed a form at her last visit requesting a new provider. She has not heard anything. Please advise

## 2014-03-01 ENCOUNTER — Other Ambulatory Visit: Payer: Self-pay | Admitting: *Deleted

## 2014-03-01 DIAGNOSIS — E1165 Type 2 diabetes mellitus with hyperglycemia: Secondary | ICD-10-CM

## 2014-03-01 DIAGNOSIS — E118 Type 2 diabetes mellitus with unspecified complications: Principal | ICD-10-CM

## 2014-03-01 DIAGNOSIS — IMO0002 Reserved for concepts with insufficient information to code with codable children: Secondary | ICD-10-CM

## 2014-03-01 MED ORDER — INSULIN ASPART 100 UNIT/ML FLEXPEN: PEN_INJECTOR | SUBCUTANEOUS | Status: DC

## 2014-03-01 MED ORDER — ACCU-CHEK NANO SMARTVIEW W/DEVICE KIT: 1.0000 | PACK | Status: DC | PRN

## 2014-03-01 MED ORDER — ACCU-CHEK NANO SMARTVIEW W/DEVICE KIT: PACK | Status: DC

## 2014-03-01 NOTE — Addendum Note (Signed)
Addended by: Lupita Dawn on: 03/01/2014 02:58 PM   Modules accepted: Orders

## 2014-03-01 NOTE — Telephone Encounter (Signed)
Pt request Accu-Chek Nano Smartview meter.  Derl Barrow, RN

## 2014-03-01 NOTE — Telephone Encounter (Signed)
Prescription for meter sent to pharmacy.

## 2014-03-11 NOTE — Telephone Encounter (Signed)
Patient requesting to change to a new PCP since Dr. Ree Kida does not see patients in the afternoon.  States she works in the morning and is only available to come in the afternoon.  Discussed changing PCP to a resident since faculty are not accepting new patients.  Patient verbalized understanding and still wants to change her PCP.  Changed PCP to Dr. Kathrine Cords and scheduled appt for 03/18/14 at 2:30 pm to meet new PCP/DM follow-up.  Burna Forts, BSN, RN-BC

## 2014-03-18 ENCOUNTER — Ambulatory Visit: Payer: Medicaid Other | Admitting: Family Medicine

## 2014-04-15 ENCOUNTER — Encounter: Payer: Self-pay | Admitting: Family Medicine

## 2014-04-15 ENCOUNTER — Ambulatory Visit (INDEPENDENT_AMBULATORY_CARE_PROVIDER_SITE_OTHER): Payer: Medicaid Other | Admitting: Family Medicine

## 2014-04-15 VITALS — BP 120/88 | HR 105 | Temp 98.4°F | Ht 59.0 in | Wt 209.0 lb

## 2014-04-15 DIAGNOSIS — E118 Type 2 diabetes mellitus with unspecified complications: Secondary | ICD-10-CM

## 2014-04-15 DIAGNOSIS — G629 Polyneuropathy, unspecified: Secondary | ICD-10-CM

## 2014-04-15 DIAGNOSIS — J302 Other seasonal allergic rhinitis: Secondary | ICD-10-CM | POA: Diagnosis not present

## 2014-04-15 DIAGNOSIS — E1165 Type 2 diabetes mellitus with hyperglycemia: Secondary | ICD-10-CM

## 2014-04-15 DIAGNOSIS — E1342 Other specified diabetes mellitus with diabetic polyneuropathy: Secondary | ICD-10-CM

## 2014-04-15 DIAGNOSIS — IMO0002 Reserved for concepts with insufficient information to code with codable children: Secondary | ICD-10-CM

## 2014-04-15 DIAGNOSIS — E1142 Type 2 diabetes mellitus with diabetic polyneuropathy: Secondary | ICD-10-CM

## 2014-04-15 DIAGNOSIS — G5711 Meralgia paresthetica, right lower limb: Secondary | ICD-10-CM | POA: Diagnosis not present

## 2014-04-15 LAB — POCT GLYCOSYLATED HEMOGLOBIN (HGB A1C): HEMOGLOBIN A1C: 14.5

## 2014-04-15 MED ORDER — FLUCONAZOLE 150 MG PO TABS: 150.0000 mg | ORAL_TABLET | Freq: Once | ORAL | Status: DC

## 2014-04-15 MED ORDER — GABAPENTIN 300 MG PO CAPS: ORAL_CAPSULE | ORAL | Status: DC

## 2014-04-15 MED ORDER — ATORVASTATIN CALCIUM 40 MG PO TABS: 40.0000 mg | ORAL_TABLET | Freq: Every day | ORAL | Status: DC

## 2014-04-15 MED ORDER — MOMETASONE FUROATE 50 MCG/ACT NA SUSP: 2.0000 | Freq: Every day | NASAL | Status: DC

## 2014-04-15 MED ORDER — ALBUTEROL SULFATE HFA 108 (90 BASE) MCG/ACT IN AERS: 2.0000 | INHALATION_SPRAY | Freq: Four times a day (QID) | RESPIRATORY_TRACT | Status: DC | PRN

## 2014-04-15 MED ORDER — INSULIN ASPART 100 UNIT/ML FLEXPEN: PEN_INJECTOR | SUBCUTANEOUS | Status: DC

## 2014-04-15 NOTE — Assessment & Plan Note (Addendum)
The patient's CBGs are not under control, however this is not unexpected given her current regimen and the fact that she has not been compliant.  It sounds as though she's had relative neuroglycopenia (from her accounts she's experiencing shaking/clamminess when her glucose drops in the 300s). Given her symptoms, I believe it would be ideal to decrease her blood sugars slowly.  - Continue on Lantus 70u BID - Increase Novolog from 4 to 7 units with meals TID - Patient states she will start taking Actos  - Urged patient to make an appt with Dr.Koval (continue Lantus vs transition back to U500). -Patient has hear of Invokana several times in the past, however given her h/o yeast infections is still reluctant  - Urged patient to start checking her CBGs regularly, both fasting and random  - Patient to make appt with ophthalmologist in the next few months for retinal exam - I stressed that her polyuria, polydipsia, nocturia, and possibly some of her neuropathy may improve with improved glucose control  - After reviewing old records, I cannot see where she was advised to discontinue lisinopril and what "water pill" she is currently taking. At next appt we will restart this for renal protection as long as her BPs and electrolytes remain stable  - Pt to f/u in 3 months with me, at that time will recheck A1c

## 2014-04-15 NOTE — Patient Instructions (Addendum)
It was nice to meet you.  Please increase Novolog to 7units 3 times a day with meals Continue Lantus 70 units daily. I have prescribed Actos (which is a medication that you take by mouth), please start this. Gabapentin will help with the numbness in your hands and feet- since you've been taking 1 tablet daily, increase to 1 tablet twice daily today and then after 2 (two) weeks increase to 2 tablets in the morning and 1 at bedtime. Please make an appointment with pharmacy clinic.

## 2014-04-15 NOTE — Progress Notes (Signed)
Patient ID: Margaret Larsen, female   DOB: 11-20-1970, 44 y.o.   MRN: [id number]    Subjective: CC:DM follow up, meet new MD HPI: Patient is a 44 y.o. female with a past medical history of T2DM, HTN, neuropathy, and OSA, among other things presenting to clinic today for a DM f/u and to meet her new MD.  DM: - reports compliance with Lantus 70 units twice a day and Novolog 4 units three times per day with meals - does admit to missing 1-2 doses of insulin per week - there has been concerns previously about appropriately giving herself injections, pt did not bring in meds - Was prescribed Actos once again by Dr. Venetia Maxon on last visit but no one told her to pick it up. - reports she checks CBG 2 times per day randomly and they range from 225-400s; she later states she only checks CBGs when she feels funny (when she feels shaky/clammy in the middle of the night)- at that time her CBGs will be 300 and she will eat something to resolve the symptoms.  - Endorses polyuria, polydipsia, and nocturia x 5-6x per night.  - Also endorses numbness and "pin and needles" sensation in her fingers and feet- she does not check her feet and she feels the gabapentin is not helping. When asked, she only takes 393m once daily (did not increase as advised) - she saw Dr. KDwyane Deewith endo but would prefer to stay with [place] - she does have recurrent yeast infections and requests refill on Diflucan  - has been on U-500 concentrated regular insulin, which gave her hypoglycemic (although Im not sure if this was true hypoglycemia or if she just felt shaky with CBGs in the 300s) > she would not mind trying U500 again as long as she doesn't get the same hypoglycemic feeling.  - has been on metformin in the past, with intolerable GI upset - she was also prescribed Lipitor and ASA but never refilled this. - she is not currently taking lisinopril as someone told he to stop this- she is now only on a "water  pill"   ROS: All other systems reviewed and are negative.  Past Medical History Patient Active Problem List   Diagnosis Date Noted  . Open wound of right foot 12/01/2013  . Itching 11/16/2013  . Atypical squamous cells of undetermined significance (ASCUS) on Papanicolaou smear of cervix 03/22/2013  . At risk for healthcare associated infection 03/15/2013  . GERD (gastroesophageal reflux disease) 11/13/2012  . Glaucoma suspect of both eyes 06/25/2012  . Kidney stones 01/24/2011  . OSA (obstructive sleep apnea) 10/18/2010  . Diabetic peripheral neuropathy 01/23/2010  . PERSONAL HISTORY OF ALLERGY TO PEANUTS 11/02/2009  . PTSD 07/31/2009  . Uncontrolled type 2 diabetes mellitus with complication [id number] . HYPERTENSION, BENIGN ESSENTIAL 11/12/2007  . DYSLIPIDEMIA 10/02/2007  . BIPOLAR DISORDER UNSPECIFIED 08/11/2007  . OBESITY, NOS 03/06/2006    Medications- reviewed and updated Current Outpatient Prescriptions  Medication Sig Dispense Refill  . ACCU-CHEK FASTCLIX LANCETS MISC 1 each by Does not apply route 3 (three) times daily. ICD 9 250.92 102 each 11  . albuterol (PROVENTIL HFA;VENTOLIN HFA) 108 (90 BASE) MCG/ACT inhaler Inhale 2 puffs into the lungs every 6 (six) hours as needed for wheezing. 1 Inhaler 3  . ammonium lactate (LAC-HYDRIN) 12 % cream Apply topically as needed for dry skin. 385 g 0  . aspirin 81 MG EC tablet Take 81 mg by mouth daily.     .Marland Kitchen  insulin aspart (NOVOLOG FLEXPEN) 100 UNIT/ML FlexPen Inject 7 units before meals three times a day. E11.8 5 pen 2  . Insulin Glargine (LANTUS SOLOSTAR) 100 UNIT/ML Solostar Pen 70 U [place] every morning, 70 U [place] every afternoon 11 pen 3  . Insulin Pen Needle (PX SHORTLENGTH PEN NEEDLES) 31G X 8 MM MISC 1 each by Does not apply route QID. 120 each 11  . Insulin Syringe-Needle U-100 (INSULIN SYRINGE .3CC/31GX5/16") 31G X 5/16" 0.3 ML MISC Use 3 needles per day 100 each 3  . lurasidone (LATUDA) 40 MG TABS Take 40 mg by mouth daily  with breakfast.    . mometasone (NASONEX) 50 MCG/ACT nasal spray Place 2 sprays into the nose daily. 17 g 12  . atorvastatin (LIPITOR) 40 MG tablet Take 1 tablet (40 mg total) by mouth daily. 90 tablet 3  . Blood Glucose Monitoring Suppl (ACCU-CHEK NANO SMARTVIEW) W/DEVICE KIT Use device to check blood sugar 4 times daily (before each meal and before bed). E11.8 1 kit 0  . cetirizine (ZYRTEC) 10 MG tablet Take 1 tablet (10 mg total) by mouth at bedtime. 90 tablet 3  . desonide (DESOWEN) 0.05 % cream Apply topically 2 (two) times daily. 30 g 0  . EPINEPHrine (EPI-PEN) 0.3 mg/0.3 mL DEVI Inject 0.3 mLs (0.3 mg total) into the muscle as needed. For allergic reaction 1 Device 0  . fluconazole (DIFLUCAN) 150 MG tablet Take 1 tablet (150 mg total) by mouth once. 1 tablet 0  . gabapentin (NEURONTIN) 300 MG capsule Take 2 tablets (623m) at bedtime and 1 tablet (3063m in the morning 90 capsule 12  . glucose blood (ACCU-CHEK SMARTVIEW) test strip 1 each by Other route 3 (three) times daily. ICD 9 250.92 100 each 11  . hydrOXYzine (ATARAX/VISTARIL) 25 MG tablet Take 1 tablet (25 mg total) by mouth 2 (two) times daily as needed. (Patient not taking: Reported on 04/15/2014) 30 tablet 0  . insulin regular human CONCENTRATED (HUMULIN R) 500 UNIT/ML SOLN injection Inject 12 units three times a day with meals (Patient not taking: Reported on 04/15/2014) 20 mL 1  . lisinopril (PRINIVIL,ZESTRIL) 10 MG tablet Take 1 tablet (10 mg total) by mouth daily. (Patient not taking: Reported on 04/15/2014) 90 tablet 3  . pioglitazone (ACTOS) 30 MG tablet Take 1 tablet (30 mg total) by mouth daily. (Patient not taking: Reported on 04/15/2014) 30 tablet 3   No current facility-administered medications for this visit.    Objective: Office vital signs reviewed. BP 120/88 mmHg  Pulse 105  Temp(Src) 98.4 F (36.9 C) (Oral)  Ht 4' 11" (1.499 m)  Wt 209 lb (94.802 kg)  BMI 42.19 kg/m2   Physical Examination:  General: Awake,  alert, obese, NAD ENMT:  TMs intact, normal light reflex, no erythema, no bulging. Nasal turbinates moist. MMM, Oropharynx clear without erythema or tonsillar exudate/hypertrophy Eyes: Conjunctiva non-injected. Cardio: RRR, no m/r/g noted. 1+ DP pulses bilaterally. No pitting edema Pulm: No increased WOB.  CTAB, without wheezes, rhonchi or crackles noted.  MSK: Normal gait and station. Skin: dry, intact, no rashes or lesions Neuro: Decreased sensation over the feet bilaterally except around the lateral heel bilaterally.  Skin: Feet dry/scaly/cracked without fissures or ulcerations.    Assessment/Plan: Uncontrolled type 2 diabetes mellitus with complication The patient's CBGs are not under control, however this is not unexpected given her current regimen and the fact that she has not been compliant.  It sounds as though she's had relative neuroglycopenia (from her accounts she's  experiencing shaking/clamminess when her glucose drops in the 300s). Given her symptoms, I believe it would be ideal to decrease her blood sugars slowly.  - Continue on Lantus 70u BID - Increase Novolog from 4 to 7 units with meals TID - Patient states she will start taking Actos  - Urged patient to make an appt with Dr.Koval (continue Lantus vs transition back to U500). -Patient has hear of Invokana several times in the past, however given her h/o yeast infections is still reluctant  - Urged patient to start checking her CBGs regularly, both fasting and random  - Patient to make appt with ophthalmologist in the next few months for retinal exam - I stressed that her polyuria, polydipsia, nocturia, and possibly some of her neuropathy may improve with improved glucose control  - After reviewing old records, I cannot see where she was advised to discontinue lisinopril and what "water pill" she is currently taking. At next appt we will restart this for renal protection as long as her BPs and electrolytes remain stable  - Pt  to f/u in 3 months with me, at that time will recheck A1c   Diabetic peripheral neuropathy Patient only currently taking gabapentin (342m) daily instead of 6013min the AM and 30013mn the PM. - Advised patient to increase from 300m78m to 300mg50m and after 1 week, will go to 600mg 69mhe AM and 300mg i64me PM (as she was prescribed previously). - Discussed daily foot exams given the patient's numbness and her h/o open wound of the R foot.      Orders Placed This Encounter  Procedures  . HgB A1c    Meds ordered this encounter  Medications  . albuterol (PROVENTIL HFA;VENTOLIN HFA) 108 (90 BASE) MCG/ACT inhaler    Sig: Inhale 2 puffs into the lungs every 6 (six) hours as needed for wheezing.    Dispense:  1 Inhaler    Refill:  3  . atorvastatin (LIPITOR) 40 MG tablet    Sig: Take 1 tablet (40 mg total) by mouth daily.    Dispense:  90 tablet    Refill:  3  . gabapentin (NEURONTIN) 300 MG capsule    Sig: Take 2 tablets (600mg) a24mdtime and 1 tablet (300mg) in74m morning    Dispense:  90 capsule    Refill:  12    Start with 1 tab at night, then progress to three times a day as tolerated  . insulin aspart (NOVOLOG FLEXPEN) 100 UNIT/ML FlexPen    Sig: Inject 7 units before meals three times a day. E11.8    Dispense:  5 pen    Refill:  2  . mometasone (NASONEX) 50 MCG/ACT nasal spray    Sig: Place 2 sprays into the nose daily.    Dispense:  17 g    Refill:  12  . fluconazole (DIFLUCAN) 150 MG tablet    Sig: Take 1 tablet (150 mg total) by mouth once.    Dispense:  1 tablet    Refill:  0    [place]

## 2014-04-15 NOTE — Assessment & Plan Note (Signed)
Patient only currently taking gabapentin (300mg ) daily instead of 600mg  in the AM and 300mg  in the PM. - Advised patient to increase from 300mg  QD to 300mg  BID and after 1 week, will go to 600mg  in the AM and 300mg  in the PM (as she was prescribed previously). - Discussed daily foot exams given the patient's numbness and her h/o open wound of the R foot.

## 2014-04-18 NOTE — Progress Notes (Signed)
I was the preceptor on the day of this visit.   Kataleena Holsapple MD  

## 2014-04-29 ENCOUNTER — Ambulatory Visit: Payer: Medicaid Other | Admitting: Pharmacist

## 2014-05-27 ENCOUNTER — Ambulatory Visit: Payer: Medicaid Other

## 2014-06-02 ENCOUNTER — Ambulatory Visit: Payer: Self-pay | Admitting: Family Medicine

## 2014-06-03 ENCOUNTER — Ambulatory Visit (INDEPENDENT_AMBULATORY_CARE_PROVIDER_SITE_OTHER): Payer: Self-pay | Admitting: Family Medicine

## 2014-06-03 ENCOUNTER — Encounter: Payer: Self-pay | Admitting: Family Medicine

## 2014-06-03 VITALS — BP 134/82 | HR 89 | Temp 98.4°F | Ht 59.0 in | Wt 205.2 lb

## 2014-06-03 DIAGNOSIS — IMO0002 Reserved for concepts with insufficient information to code with codable children: Secondary | ICD-10-CM

## 2014-06-03 DIAGNOSIS — E118 Type 2 diabetes mellitus with unspecified complications: Secondary | ICD-10-CM

## 2014-06-03 DIAGNOSIS — E1165 Type 2 diabetes mellitus with hyperglycemia: Secondary | ICD-10-CM

## 2014-06-03 MED ORDER — INSULIN GLARGINE 100 UNIT/ML SOLOSTAR PEN: 50.0000 [IU] | PEN_INJECTOR | Freq: Two times a day (BID) | SUBCUTANEOUS | Status: DC

## 2014-06-03 MED ORDER — FLUCONAZOLE 150 MG PO TABS: 150.0000 mg | ORAL_TABLET | Freq: Once | ORAL | Status: DC

## 2014-06-03 MED ORDER — INSULIN ASPART 100 UNIT/ML FLEXPEN: 20.0000 [IU] | PEN_INJECTOR | Freq: Three times a day (TID) | SUBCUTANEOUS | Status: DC

## 2014-06-03 NOTE — Progress Notes (Signed)
Assisted with insulin management in patient who has been out of insulin therapy for an extended period of time.   Provided samples of both Lantus and Novolog.   Lantus 50 units administered in office (technique excellent).   Advised to take higher dose of Novolog than previously.   Will need gap therapy as she has yet to obtain her [place] card in the mail and cannot get supply of insulin from [facility] for 3-4 weeks.   Follow up Tuesday AM - First available 9:00 AM with Rx Clinic.

## 2014-06-03 NOTE — Assessment & Plan Note (Addendum)
Pt's last Hgb A1C was 14.5 about one month ago.  This averages about 390 average so I'm not surprised her CBG's are ranging around 400.   - She has not picked up her lantus or novolog in the past month due to financial issues and states the health department will not supply.  - Recommend she intake plenty of fluid.  No evidence of HHNS today or severe volume depletion  - Recommend highly to f/u with Dr. Valentina Lucks for pharmacy evaluation to transition back to U500 most likely - Discussed case with Dr. Valentina Lucks and we will start on Lantus (sample) 50 U BID and Novolog 20 U TID

## 2014-06-03 NOTE — Patient Instructions (Signed)
Please take 50 U twice per day of Lantus Please take 20 U three times per day prior to meals of Novolog Please come back and See Dr. Valentina Lucks Tuesday or Wednesday of next week.

## 2014-06-03 NOTE — Addendum Note (Signed)
Addended by: Leavy Cella on: 06/03/2014 03:30 PM   Modules accepted: Orders

## 2014-06-03 NOTE — Progress Notes (Signed)
Margaret Larsen is a 44 y.o. female who presents today for hyperglycemia  Type II DM, extremely uncontrolled - Pt with Type II DM, last A1C of 14.5 about one month ago.  Supposed to be on Lantus 70 U BID and Novolog 7 U prior to meals TID.  However, she has not been able to afford this for the past month and states she has not taken any of this.  Denies blurred vision, nausea, vomiting, dizziness, blurred vision.  Does endorse significant polyuria and polydipsia but "has been drinking a lot of water".    Past Medical History  Diagnosis Date  . Diabetes mellitus   . Renal disorder   . Kidney stones   . Hypertension   . Kidney stones   . Hemorrhoids 07/10/2010    Current Outpatient Prescriptions on File Prior to Visit  Medication Sig Dispense Refill  . ACCU-CHEK FASTCLIX LANCETS MISC 1 each by Does not apply route 3 (three) times daily. ICD 9 250.92 102 each 11  . albuterol (PROVENTIL HFA;VENTOLIN HFA) 108 (90 BASE) MCG/ACT inhaler Inhale 2 puffs into the lungs every 6 (six) hours as needed for wheezing. 1 Inhaler 3  . ammonium lactate (LAC-HYDRIN) 12 % cream Apply topically as needed for dry skin. 385 g 0  . aspirin 81 MG EC tablet Take 81 mg by mouth daily.     Marland Kitchen atorvastatin (LIPITOR) 40 MG tablet Take 1 tablet (40 mg total) by mouth daily. 90 tablet 3  . Blood Glucose Monitoring Suppl (ACCU-CHEK NANO SMARTVIEW) W/DEVICE KIT Use device to check blood sugar 4 times daily (before each meal and before bed). E11.8 1 kit 0  . cetirizine (ZYRTEC) 10 MG tablet Take 1 tablet (10 mg total) by mouth at bedtime. 90 tablet 3  . desonide (DESOWEN) 0.05 % cream Apply topically 2 (two) times daily. 30 g 0  . EPINEPHrine (EPI-PEN) 0.3 mg/0.3 mL DEVI Inject 0.3 mLs (0.3 mg total) into the muscle as needed. For allergic reaction 1 Device 0  . fluconazole (DIFLUCAN) 150 MG tablet Take 1 tablet (150 mg total) by mouth once. 1 tablet 0  . gabapentin (NEURONTIN) 300 MG capsule Take 2 tablets (674m) at bedtime  and 1 tablet (3050m in the morning 90 capsule 12  . glucose blood (ACCU-CHEK SMARTVIEW) test strip 1 each by Other route 3 (three) times daily. ICD 9 250.92 100 each 11  . hydrOXYzine (ATARAX/VISTARIL) 25 MG tablet Take 1 tablet (25 mg total) by mouth 2 (two) times daily as needed. (Patient not taking: Reported on 04/15/2014) 30 tablet 0  . insulin aspart (NOVOLOG FLEXPEN) 100 UNIT/ML FlexPen Inject 7 units before meals three times a day. E11.8 5 pen 2  . Insulin Glargine (LANTUS SOLOSTAR) 100 UNIT/ML Solostar Pen 70 U [place] every morning, 70 U [place] every afternoon 11 pen 3  . Insulin Pen Needle (PX SHORTLENGTH PEN NEEDLES) 31G X 8 MM MISC 1 each by Does not apply route QID. 120 each 11  . insulin regular human CONCENTRATED (HUMULIN R) 500 UNIT/ML SOLN injection Inject 12 units three times a day with meals (Patient not taking: Reported on 04/15/2014) 20 mL 1  . Insulin Syringe-Needle U-100 (INSULIN SYRINGE .3CC/31GX5/16") 31G X 5/16" 0.3 ML MISC Use 3 needles per day 100 each 3  . lisinopril (PRINIVIL,ZESTRIL) 10 MG tablet Take 1 tablet (10 mg total) by mouth daily. (Patient not taking: Reported on 04/15/2014) 90 tablet 3  . lurasidone (LATUDA) 40 MG TABS Take 40 mg by mouth  daily with breakfast.    . mometasone (NASONEX) 50 MCG/ACT nasal spray Place 2 sprays into the nose daily. 17 g 12  . pioglitazone (ACTOS) 30 MG tablet Take 1 tablet (30 mg total) by mouth daily. (Patient not taking: Reported on 04/15/2014) 30 tablet 3   No current facility-administered medications on file prior to visit.    ROS: Per HPI.  All other systems reviewed and are negative.   Physical Exam Filed Vitals:   06/03/14 1431  BP: 134/82  Pulse: 89  Temp: 98.4 F (36.9 C)    Physical Examination: General appearance - alert, well appearing, and in no distress Mouth - Dry Mucous membranes Heart - normal rate and regular rhythm Abdomen - soft, nontender, nondistended, no masses or organomegaly    Chemistry      Component  Value Date/Time   NA 131* 08/09/2013 1056   K 3.7 08/09/2013 1056   CL 98 08/09/2013 1056   CO2 28 08/09/2013 1056   BUN 5* 08/09/2013 1056   CREATININE 0.6 08/09/2013 1056   CREATININE 0.65 06/18/2013 1525      Component Value Date/Time   CALCIUM 8.7 08/09/2013 1056   ALKPHOS 87 04/10/2012 1342   AST 16 04/10/2012 1342   ALT 9 04/10/2012 1342   BILITOT 0.3 04/10/2012 1342       Lab Results  Component Value Date   HGBA1C 14.5 04/15/2014

## 2014-06-03 NOTE — Addendum Note (Signed)
Addended by: Nolon Rod on: 06/03/2014 03:14 PM   Modules accepted: Orders

## 2014-06-07 ENCOUNTER — Encounter: Payer: Self-pay | Admitting: Pharmacist

## 2014-06-07 ENCOUNTER — Ambulatory Visit (INDEPENDENT_AMBULATORY_CARE_PROVIDER_SITE_OTHER): Payer: Self-pay | Admitting: Pharmacist

## 2014-06-07 VITALS — BP 121/75 | HR 85 | Wt 208.0 lb

## 2014-06-07 DIAGNOSIS — IMO0002 Reserved for concepts with insufficient information to code with codable children: Secondary | ICD-10-CM

## 2014-06-07 DIAGNOSIS — E118 Type 2 diabetes mellitus with unspecified complications: Secondary | ICD-10-CM

## 2014-06-07 DIAGNOSIS — E1165 Type 2 diabetes mellitus with hyperglycemia: Secondary | ICD-10-CM

## 2014-06-07 MED ORDER — INSULIN ASPART 100 UNIT/ML FLEXPEN: 25.0000 [IU] | PEN_INJECTOR | Freq: Three times a day (TID) | SUBCUTANEOUS | Status: DC

## 2014-06-07 MED ORDER — INSULIN NPH (HUMAN) (ISOPHANE) 100 UNIT/ML ~~LOC~~ SUSP: 60.0000 [IU] | Freq: Two times a day (BID) | SUBCUTANEOUS | Status: DC

## 2014-06-07 NOTE — Assessment & Plan Note (Signed)
Diabetes diagnosed ~ 8 years ago currently insulin requiring who has been unable to afford her medications including insulin with a change in insurance coverage (no longer getting Medicaid).  She is awaiting her "orange card" in the mail, she has bee approved.   Her blood sugar was improved from > 400 on Friday to "in the 200s" with use of Lantus and Novolog samples provided Friday.    Denies hypoglycemic events and is able to verbalize appropriate hypoglycemia management plan.  reports adherence with medication. Control is suboptimal due to sedentary lifestyle and inability to afford medicaiton.   Provided additional sample of Novolog Pen and increased dose from 20 units to 25 units prior to meals.   Changed Lantus to Novolin (Relion) NPH and increased the dose to 60 units twice daily.   I will continue to adjust and attempt to identify an appropriate insulin regimen to submit for MAP coverage with use of Novolog samples and the patient purchasing NOVOLIN  (Relion) NPH insulin.   I anticipate using the addition of GLP agent to her insulin regimen when she is able to obtain medications through [facility] (she has done this process before).   I intend to convert patient back to Lantus and Novolog with Trulicity once weekly through MAP at the next office visit.  (She will have new prescriptions sent to MAP at the next visit with me and then change back to these medications in ~ July when they are available.   Next A1C anticipated in 3 months.   Written patient instructions provided.  Follow up in Pharmacist Clinic Visit 1 week.   Total time in face to face counseling 35 minutes.

## 2014-06-07 NOTE — Patient Instructions (Addendum)
Increase dose of Novolog to 25 units prior to each meal.  2-3 times per day.   Pick up NPH Relion from [facility] and take 60 units twice daily.   Increase your walking.   Follow up with Pharmacy Clinic Next Week.  Hopefully you will have good news about your [employer].

## 2014-06-07 NOTE — Progress Notes (Signed)
Patient ID: Margaret Larsen, female   DOB: 1970/07/24, 44 y.o.   MRN: [id number] Reviewed: Agree with Dr. Graylin Shiver documentation and management.

## 2014-06-07 NOTE — Progress Notes (Signed)
S:    Patient arrives accompanied by a 47 year old young child (not patient's daughter).   Presents for diabetes follow up after being seen in same day clinic with Dr. Awanda Mink on Friday (4 days ago).   Patient reports having history of Diabetes since 2009.     She has been taking insulin for many years and has used U-500 insulin in the past.   Patient reports adherence with medications over the last 4 days. Current diabetes medications include Actos, Lantus 50 units BID and Novolog 20 units prior to each significant meal.   Patient denies hypoglycemic events.   Patient reports improvement nocturia with decreased frequency from "many" time per night to just a few.   Patient recognized that her period of excellent control of blood sugar in the past was when the patient was walking 3 times daily for 45 minutes with a friend.    She was open to considering a return to a structured/planned walking schedule.    O:  Lab Results  Component Value Date   HGBA1C 14.5 04/15/2014     Home fasting CBG: "in the 200's" with a reading of 260 this AM  A/P: Diabetes diagnosed ~ 8 years ago currently insulin requiring who has been unable to afford her medications including insulin with a change in insurance coverage (no longer getting Medicaid).  She is awaiting her "orange card" in the mail, she has bee approved.   Her blood sugar was improved from > 400 on Friday to "in the 200s" with use of Lantus and Novolog samples provided Friday.    Denies hypoglycemic events and is able to verbalize appropriate hypoglycemia management plan.  reports adherence with medication. Control is suboptimal due to sedentary lifestyle and inability to afford medicaiton.   Provided additional sample of Novolog Pen and increased dose from 20 units to 25 units prior to meals.   Changed Lantus to Novolin (Relion) NPH and increased the dose to 60 units twice daily.   I will continue to adjust and attempt to identify an appropriate insulin  regimen to submit for MAP coverage with use of Novolog samples and the patient purchasing NOVOLIN  (Relion) NPH insulin.   I anticipate using the addition of GLP agent to her insulin regimen when she is able to obtain medications through [facility] (she has done this process before).   I intend to convert patient back to Lantus and Novolog with Trulicity once weekly through MAP at the next office visit.  (She will have new prescriptions sent to MAP at the next visit with me and then change back to these medications in ~ July when they are available.   Next A1C anticipated in 3 months.   Written patient instructions provided.  Follow up in Pharmacist Clinic Visit 1 week.   Total time in face to face counseling 35 minutes.

## 2014-06-16 ENCOUNTER — Encounter: Payer: Self-pay | Admitting: Pharmacist

## 2014-06-16 ENCOUNTER — Ambulatory Visit (INDEPENDENT_AMBULATORY_CARE_PROVIDER_SITE_OTHER): Payer: Self-pay | Admitting: Pharmacist

## 2014-06-16 VITALS — BP 152/104 | Ht 59.0 in | Wt 210.8 lb

## 2014-06-16 DIAGNOSIS — E118 Type 2 diabetes mellitus with unspecified complications: Secondary | ICD-10-CM

## 2014-06-16 DIAGNOSIS — E1165 Type 2 diabetes mellitus with hyperglycemia: Secondary | ICD-10-CM

## 2014-06-16 DIAGNOSIS — IMO0002 Reserved for concepts with insufficient information to code with codable children: Secondary | ICD-10-CM

## 2014-06-16 MED ORDER — INSULIN GLARGINE 100 UNIT/ML SOLOSTAR PEN: 60.0000 [IU] | PEN_INJECTOR | Freq: Two times a day (BID) | SUBCUTANEOUS | Status: DC

## 2014-06-16 MED ORDER — INSULIN ASPART 100 UNIT/ML FLEXPEN: 30.0000 [IU] | PEN_INJECTOR | Freq: Three times a day (TID) | SUBCUTANEOUS | Status: DC

## 2014-06-16 MED ORDER — DULAGLUTIDE 0.75 MG/0.5ML ~~LOC~~ SOAJ: 0.7500 mg | SUBCUTANEOUS | Status: DC

## 2014-06-16 MED ORDER — INSULIN NPH (HUMAN) (ISOPHANE) 100 UNIT/ML ~~LOC~~ SUSP: 60.0000 [IU] | Freq: Two times a day (BID) | SUBCUTANEOUS | Status: DC

## 2014-06-16 NOTE — Assessment & Plan Note (Signed)
Diabetes diagnosed about 8 years ago.  Denies hypoglycemic events but does report having hypoglycemic symptoms occasionally She is able to verbalize appropriate hypoglycemia management plan.  reports adherence with medication, but is not taking certain medications due to issues with med coverage. Control is suboptimal due to obesity, financial issues, diet.  Plan is to work on the paperwork for [place] [facility] so that she gets coverage for her medications in the next week or so. Talked about starting Trulicity as another diabetes agent with patient today as it will help better control her DM and potentially help with weight loss. Will plan to send in a script to [facility]. Introduced the patient to GLP-1 agonists and will plan to do further education upon next visit.  Continue Novolin NPH 60 units BID. Increased dose of rapid insulin Novolog (insulin aspart) to 30 units TID with meals.

## 2014-06-16 NOTE — Patient Instructions (Addendum)
Thank you for coming in today!  Keep taking the NPH 60 units twice daily. Increase the Novolog to 30 units before meals. Continue checking blood sugars at home.   We will start the paperwork on Lantus and Trulicity and follow up in a couple weeks to see how things are going. Further education on Trulicity will be given at the next appointment.  Next appointment with pharmacy clinic in 2 weeks to adjust medications.

## 2014-06-16 NOTE — Progress Notes (Signed)
S:    Patient arrives for clinic today in good spirits. Presents for diabetes management and update on getting an orange card for [facility].  Diabetes diagnosed about 8 years ago.  Patient reports adherence with medications. Current diabetes medications include Novolin NPH 60 units BID and Novolog 25 units TID with meals.  Patient denies hypoglycemic events. She does report having a low of 104 and having a bad headache after taking her Novolog and not eating a meal. Also reports some hypoglycemic symptoms when she is in the low 200s  Patient reported exercise habits: Pt reports a little bit of walking. Plans to increase walking in the coming months.   O:  . Lab Results  Component Value Date   HGBA1C 14.5 04/15/2014     Home fasting CBG: Usually in the low to mid 200s. Occasionally 180-190s or upper 200s.  A/P: Diabetes diagnosed about 8 years ago.  Denies hypoglycemic events but does report having hypoglycemic symptoms occasionally She is able to verbalize appropriate hypoglycemia management plan.  reports adherence with medication, but is not taking certain medications due to issues with med coverage. Control is suboptimal due to obesity, financial issues, diet.  Plan is to work on the paperwork for [place] [facility] so that she gets coverage for her medications in the next week or so. Talked about starting Trulicity as another diabetes agent with patient today as it will help better control her DM and potentially help with weight loss. Will plan to send in a script to [facility]. Introduced the patient to GLP-1 agonists and will plan to do further education upon next visit.  Continue Novolin NPH 60 units BID. Increased dose of rapid insulin Novolog (insulin aspart) to 30 units TID with meals. Next A1C anticipated in a couple months.  Written patient instructions provided.  Follow up in Pharmacist Clinic Visit in 2 weeks. Total time in face to face counseling 30 minutes. Patient seen with Nilsa Nutting, PharmD Candidate and Elenor Quinones, PharmD Resident.

## 2014-06-16 NOTE — Progress Notes (Signed)
S 

## 2014-06-17 NOTE — Progress Notes (Signed)
Patient ID: Margaret Larsen, female   DOB: 1970/03/02, 44 y.o.   MRN: [id number] Reviewed: Agree with Dr. Graylin Shiver documentation and management.

## 2014-06-30 ENCOUNTER — Ambulatory Visit (INDEPENDENT_AMBULATORY_CARE_PROVIDER_SITE_OTHER): Payer: Self-pay | Admitting: Pharmacist

## 2014-06-30 VITALS — BP 147/102 | HR 76 | Ht 59.0 in | Wt 209.9 lb

## 2014-06-30 DIAGNOSIS — E118 Type 2 diabetes mellitus with unspecified complications: Secondary | ICD-10-CM

## 2014-06-30 DIAGNOSIS — E1165 Type 2 diabetes mellitus with hyperglycemia: Secondary | ICD-10-CM

## 2014-06-30 DIAGNOSIS — I10 Essential (primary) hypertension: Secondary | ICD-10-CM

## 2014-06-30 DIAGNOSIS — IMO0002 Reserved for concepts with insufficient information to code with codable children: Secondary | ICD-10-CM

## 2014-06-30 MED ORDER — INSULIN ASPART 100 UNIT/ML ~~LOC~~ SOLN: 40.0000 [IU] | Freq: Two times a day (BID) | SUBCUTANEOUS | Status: DC

## 2014-06-30 MED ORDER — INSULIN ASPART 100 UNIT/ML FLEXPEN: 40.0000 [IU] | PEN_INJECTOR | Freq: Two times a day (BID) | SUBCUTANEOUS | Status: DC

## 2014-06-30 MED ORDER — LISINOPRIL 10 MG PO TABS: 10.0000 mg | ORAL_TABLET | Freq: Every day | ORAL | Status: DC

## 2014-06-30 MED ORDER — INSULIN NPH (HUMAN) (ISOPHANE) 100 UNIT/ML ~~LOC~~ SUSP: 70.0000 [IU] | Freq: Two times a day (BID) | SUBCUTANEOUS | Status: DC

## 2014-06-30 MED ORDER — INSULIN GLARGINE 100 UNIT/ML SOLOSTAR PEN: 70.0000 [IU] | PEN_INJECTOR | Freq: Two times a day (BID) | SUBCUTANEOUS | Status: DC

## 2014-06-30 MED ORDER — INSULIN ASPART 100 UNIT/ML FLEXPEN: 35.0000 [IU] | PEN_INJECTOR | Freq: Three times a day (TID) | SUBCUTANEOUS | Status: DC

## 2014-06-30 MED ORDER — AGAMATRIX PRESTO PRO METER DEVI: 1.0000 | Freq: Once | Status: DC

## 2014-06-30 MED ORDER — GLUCOSE BLOOD VI STRP: ORAL_STRIP | Status: DC

## 2014-06-30 MED ORDER — ATORVASTATIN CALCIUM 40 MG PO TABS: 40.0000 mg | ORAL_TABLET | Freq: Every day | ORAL | Status: DC

## 2014-06-30 MED ORDER — DULAGLUTIDE 0.75 MG/0.5ML ~~LOC~~ SOAJ: 0.7500 mg | SUBCUTANEOUS | Status: DC

## 2014-06-30 NOTE — Assessment & Plan Note (Signed)
Restarted  Lisinopril through [employer] as soon as the patient can purchase.  Discussed need for using these in order to decrease her risk of heart attack and stroke.   New prescription was sent to the pharmacy today.

## 2014-06-30 NOTE — Progress Notes (Signed)
S:    Patient arrives today ambulating without assistance with complaint of headache.    Presents for diabetes follow up.  Patient reports having history of Diabetes for 8 years.   Patient denies adherence with medications. She reports adherence with diabetes medications but is unable to get any other medications. She reports she has an appointment with [facility] on 07/13/14. Current diabetes medications include Novolog 30 units twice daily before meals and Novolin NPH 70 units twice daily.  Patient denies hypoglycemic events.  Patient reported dietary habits: no changes in diet, reports eating 1-2 meals per day.  Patient reported exercise habits: Does report walking 15-30 minutes about every other morning with her niece.   Patient denies nocturia. Patient reports neuropathy. Patient reports visual changes.    O:  . Lab Results  Component Value Date   HGBA1C 14.5 04/15/2014     Home fasting CBG: mid 100s to mid 200s Usually mid 200s at morning and night time. Occasionally goes into the upper 100s   A/P: Diabetes history of 8 years currently uncontrolled but improving based on CBGs mid 100s to mid 200s down from 300-400 about a month ago.   Denies hypoglycemic events and is able to verbalize appropriate hypoglycemia management plan.  Denies adherence with medication. Control is suboptimal due to financial issues, obesity, sedentary lifestyle, and diet.   Continue Novolin (insulin NPH) 70 units twice daily. Increase rapid insulin Novolog (insulin aspart) to 35 units twice daily before meals. Plan is to convert back to Lantus and continue Novolog with medication supply from [facility] and to start trulicity after visit with [facility].  Next A1C anticipated 08/2014.  Written patient instructions provided. Requested EchoStar glucometer & strips from [employer] - requested at least once daily testing in the AM.     Restarted Atorvastatin and Lisinopril through [employer] as soon as the patient can purchase.  Discussed need for using these in order to decrease her risk of heart attack and stroke.   New prescriptions were sent to the pharmacy today.     Follow up in Pharmacist Clinic Visit mid July.   Total time in face to face counseling 45 minutes.  Patient seen with Nilsa Nutting, PharmD Candidate and Elenor Quinones, PharmD Resident.

## 2014-06-30 NOTE — Patient Instructions (Addendum)
It was great to see you today!  Continue taking Novolin NPH 70 units twice daily  Increase your Novolog to 35 units three times daily with meals  Restart lisinopril 10mg  daily for your high blood pressure to prevent heart attacks and stroke  Restart atorvastatin 40mg  daily for cholesterol to prevent heart attacks and stroke  Pick up meter and strips from [facility]  Follow up with Pharmacy clinic in mid July.

## 2014-06-30 NOTE — Assessment & Plan Note (Signed)
Restarted Atorvastatin through [facility] as soon as the patient can purchase.  Discussed need for using this in order to decrease her risk of heart attack and stroke.   New prescriptions sent to the pharmacy today.

## 2014-06-30 NOTE — Assessment & Plan Note (Signed)
Diabetes history of 8 years currently uncontrolled but improving based on CBGs mid 100s to mid 200s down from 300-400 about a month ago.   Denies hypoglycemic events and is able to verbalize appropriate hypoglycemia management plan.  Denies adherence with medication. Control is suboptimal due to financial issues, obesity, sedentary lifestyle, and diet.   Continue Novolin (insulin NPH) 70 units twice daily. Increase rapid insulin Novolog (insulin aspart) to 35 units twice daily before meals. Plan is to convert back to Lantus and continue Novolog with medication supply from [facility] and to start trulicity after visit with [facility].  Next A1C anticipated 08/2014.  Written patient instructions provided. Requested EchoStar glucometer & strips from [facility] - requested at least once daily testing in the AM.

## 2014-07-01 NOTE — Progress Notes (Signed)
I was the preceptor for this visit. 

## 2014-07-29 ENCOUNTER — Ambulatory Visit (INDEPENDENT_AMBULATORY_CARE_PROVIDER_SITE_OTHER): Payer: Self-pay | Admitting: Pharmacist

## 2014-07-29 ENCOUNTER — Encounter: Payer: Self-pay | Admitting: Pharmacist

## 2014-07-29 VITALS — BP 123/75 | HR 88 | Ht 60.0 in | Wt 209.6 lb

## 2014-07-29 DIAGNOSIS — IMO0002 Reserved for concepts with insufficient information to code with codable children: Secondary | ICD-10-CM

## 2014-07-29 DIAGNOSIS — E1165 Type 2 diabetes mellitus with hyperglycemia: Secondary | ICD-10-CM

## 2014-07-29 DIAGNOSIS — G5711 Meralgia paresthetica, right lower limb: Secondary | ICD-10-CM

## 2014-07-29 DIAGNOSIS — J302 Other seasonal allergic rhinitis: Secondary | ICD-10-CM

## 2014-07-29 DIAGNOSIS — E118 Type 2 diabetes mellitus with unspecified complications: Secondary | ICD-10-CM

## 2014-07-29 MED ORDER — INSULIN ASPART 100 UNIT/ML FLEXPEN: 35.0000 [IU] | PEN_INJECTOR | Freq: Two times a day (BID) | SUBCUTANEOUS | Status: DC

## 2014-07-29 MED ORDER — INSULIN GLARGINE 100 UNIT/ML SOLOSTAR PEN: 70.0000 [IU] | PEN_INJECTOR | Freq: Two times a day (BID) | SUBCUTANEOUS | Status: DC

## 2014-07-29 MED ORDER — ATORVASTATIN CALCIUM 40 MG PO TABS: 40.0000 mg | ORAL_TABLET | Freq: Every day | ORAL | Status: DC

## 2014-07-29 MED ORDER — CETIRIZINE HCL 10 MG PO TABS: 10.0000 mg | ORAL_TABLET | Freq: Every day | ORAL | Status: DC

## 2014-07-29 MED ORDER — GABAPENTIN 300 MG PO CAPS: ORAL_CAPSULE | ORAL | Status: DC

## 2014-07-29 MED ORDER — LISINOPRIL 10 MG PO TABS: 10.0000 mg | ORAL_TABLET | Freq: Every day | ORAL | Status: DC

## 2014-07-29 MED ORDER — INSULIN NPH (HUMAN) (ISOPHANE) 100 UNIT/ML ~~LOC~~ SUSP: 70.0000 [IU] | Freq: Two times a day (BID) | SUBCUTANEOUS | Status: DC

## 2014-07-29 MED ORDER — MOMETASONE FUROATE 50 MCG/ACT NA SUSP: 2.0000 | Freq: Every day | NASAL | Status: DC

## 2014-07-29 MED ORDER — ALBUTEROL SULFATE HFA 108 (90 BASE) MCG/ACT IN AERS: 2.0000 | INHALATION_SPRAY | Freq: Four times a day (QID) | RESPIRATORY_TRACT | Status: DC | PRN

## 2014-07-29 MED ORDER — DULAGLUTIDE 0.75 MG/0.5ML ~~LOC~~ SOAJ: 0.7500 mg | SUBCUTANEOUS | Status: DC

## 2014-07-29 NOTE — Patient Instructions (Addendum)
Thanks for visiting [place] today!  Increase Novolog from 30mg  to 35mg  with meals. Continue to take the Novolin 70 units twice daily. Go to MAP on Tuesday for paperwork.    Follow up with Dr. Valentina Lucks in 4-5 weeks. After you pick up your medicine.

## 2014-07-29 NOTE — Progress Notes (Signed)
S:    Patient arrives in good spirits.    Presents for type 2 diabetes mellitis.  Patient reports having history of Diabetes since the year of 2010.   Patient reports adherence with medications. Current diabetes medications include Novolog and Novolin. She is going to be put back on Lantus and is picking up Trulicity in 3-4 weeks.  Patient denies hypoglycemic events.   Patient denies nocturia. Patient reports neuropathy. Patient denies visual changes.   O:  BP 123/75 mmHg  Pulse 88  Ht 5' (1.524 m)  Wt 209 lb 9.6 oz (95.074 kg)  BMI 40.93 kg/m2 Lab Results  Component Value Date   HGBA1C 14.5 04/15/2014  Home fasting CBG: Mid-200's A1C: 14.5 (04/15/14)   A/P: Diabetes diagnosed in 2010 currently uncontrolled however significantly improved with use of current insulin regimen. Denies hypoglycemic events and is able to verbalize appropriate hypoglycemia management plan. Reports adherence with medication. Control is suboptimal due to cost of medications. Increased Novolog from 30 units to 35 units with meals. Continued Novolin 70 units twice daily until she gets back on her Lantus. Patient to meet with MAP on 7/26 and fill out paperwork.  Anticipate she will receive medication in 3-4 weeks.  Next A1C anticipated in 1-2 months (post initiation of MAP therapy).  Written patient instructions provided.  Follow up in Pharmacist Clinic Visit in 4-5 weeks after pt has received Trulicity (plan to do first dose in office).   Total time in face to face counseling 25 minutes.  Patient seen with Georgiann Mohs, PharmD Candidate.  All medications - new prescriptions provided to [facility].

## 2014-07-29 NOTE — Assessment & Plan Note (Signed)
Diabetes diagnosed in 2010 currently uncontrolled however significantly improved with use of current insulin regimen. Denies hypoglycemic events and is able to verbalize appropriate hypoglycemia management plan. Reports adherence with medication. Control is suboptimal due to cost of medications. Increased Novolog from 30 units to 35 units with meals. Continued Novolin 70 units twice daily until she gets back on her Lantus. Patient to meet with MAP on 7/26 and fill out paperwork.  Anticipate she will receive medication in 3-4 weeks.  Next A1C anticipated in 1-2 months (post initiation of MAP therapy).  Written patient instructions provided.  Follow up in Pharmacist Clinic Visit in 4-5 weeks after pt has received Trulicity (plan to do first dose in office).   Total time in face to face counseling 25 minutes.  Patient seen with Georgiann Mohs, PharmD Candidate.  All medications - new prescriptions provided to [facility].

## 2014-07-29 NOTE — Progress Notes (Signed)
Patient ID: Margaret Larsen, female   DOB: 1970/09/02, 44 y.o.   MRN: [id number] Reviewed: Agree with Dr. Graylin Shiver documentation and management.

## 2014-08-26 ENCOUNTER — Telehealth: Payer: Self-pay | Admitting: *Deleted

## 2014-08-26 NOTE — Telephone Encounter (Signed)
[name] from [facility] [phone]) called and states they need verification on patient's medication: script for novolog was called in 35U before meals 2-3 times per day and patient states that she is taking 40U before meals BID.  Also a script was called in for lisinopril 10mg  1 tablet daily and patient states that she is only supposed to be on HCTZ.  Please clarify medication doses and instructions.  Chandani Rogowski,CMA

## 2014-08-29 NOTE — Telephone Encounter (Signed)
Called to clarify medication regimen. Per Dr. Graylin Shiver previous notes, patient should be taking:  Novolog 35 units with meals (it appears from Rx twice daily) and Novolin 70 units twice daily.   From our records, she's only lisinopril 10mg  daily (which is appropriate given her DM) and not on HCTZ.   Archie Patten, MD [facility] Resident  08/29/2014, 2:20 PM

## 2014-09-15 ENCOUNTER — Telehealth: Payer: Self-pay | Admitting: Family Medicine

## 2014-09-15 DIAGNOSIS — IMO0002 Reserved for concepts with insufficient information to code with codable children: Secondary | ICD-10-CM

## 2014-09-15 DIAGNOSIS — E118 Type 2 diabetes mellitus with unspecified complications: Principal | ICD-10-CM

## 2014-09-15 DIAGNOSIS — E1165 Type 2 diabetes mellitus with hyperglycemia: Secondary | ICD-10-CM

## 2014-09-15 MED ORDER — INSULIN NPH (HUMAN) (ISOPHANE) 100 UNIT/ML ~~LOC~~ SUSP: 70.0000 [IU] | Freq: Two times a day (BID) | SUBCUTANEOUS | Status: DC

## 2014-09-15 NOTE — Telephone Encounter (Signed)
Pt asking to speak with the pharmacist, is unsure what she is supposed to be taking for her diabetes and now her blood sugars are running high

## 2014-09-15 NOTE — Telephone Encounter (Signed)
Spoke with patient, she states when she went to pharmacy to get a refill on meds they gave her lantus instead of the Humulin-N which she had been taking. Patient states that the lantus and novolog 45 that shes taking is not working and wants to know why Dr. Valentina Lucks took her off the Humulin-N which has been working. Staes her bllod sugars have been in the 300's and 400's and that she is up all night using the bathroom. Would like to speak with Dr. Valentina Lucks.

## 2014-09-15 NOTE — Telephone Encounter (Signed)
Called in follow up of reported high readings with Lantus.  Discussed change back to NPH insulin through [facility].  Additionally will begin process of MAP for this as well.   Called in new Rx for NPH insulin to [place].    Plan for her to return to office as soon as Trulicity medication comes in for her from [facility].  We will adjust insulin at that time.

## 2014-09-21 ENCOUNTER — Ambulatory Visit (INDEPENDENT_AMBULATORY_CARE_PROVIDER_SITE_OTHER): Payer: Self-pay | Admitting: Family Medicine

## 2014-09-21 VITALS — BP 136/102 | HR 85 | Temp 98.1°F | Wt 211.0 lb

## 2014-09-21 DIAGNOSIS — M79643 Pain in unspecified hand: Secondary | ICD-10-CM

## 2014-09-21 NOTE — Progress Notes (Signed)
   Subjective:    Patient ID: Margaret Larsen, female    DOB: September 17, 1970, 44 y.o.   MRN: [id number]  Seen for Same day visit for   CC: arm and hand pain   Location: numbness in your palmer aspect of hands and some pain in the anterior of the forearm.  She only have these symptoms working at [employer]. 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  Symptoms are intermittent and worse with activities at home.  Denies any problems in the morning.  Pain started: for 1 week  Pain is: achy, throbbing  Severity: 5-6/10 Medications tried: aleve no helped  Recent trauma: no Similar pain previously: no  Symptoms Redness:no Swelling:no Fever: no Weakness: yes Weight loss: no Rash: no  Review of Systems   See HPI for ROS. Objective:  BP 136/102 mmHg  Pulse 85  Temp(Src) 98.1 F (36.7 C) (Oral)  Wt 211 lb (95.709 kg)  General: NAD MSK: No obvious deformity appreciated. No pain with palpation of the right or left forearm or upper arm or shoulder No TTP of carpal bones b/l  No muscle atrophy observed  Normal flexion and abduction of shoulders bilaterally. 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 Pain is only occurring while she is at work and not while she is at home Performed limited ultrasound today and did not see any fluid accumulation in either  wrist/arm along the ligaments - Recommended that she wear a cock-up splint and/or brace while working - She can take Tylenol or ibuprofen when necessary - If there is no improvement in her symptoms in the next 3-4 weeks then consider imaging versus referral to sports medicine for ultrasound versus diagnosis of fibromyalgia plus or minus starting Elavil or increasing gabapentin

## 2014-09-21 NOTE — Assessment & Plan Note (Signed)
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 Pain is only occurring while she is at work and not while she is at home Performed limited ultrasound today and did not see any fluid accumulation in either wrist/arm along the ligaments - Recommended that she wear a cock-up splint and/or brace while working - She can take Tylenol or ibuprofen when necessary - If there is no improvement in her symptoms in the next 3-4 weeks then consider imaging versus referral to sports medicine for ultrasound versus diagnosis of fibromyalgia plus or minus starting Elavil or increasing gabapentin

## 2014-09-21 NOTE — Patient Instructions (Signed)
Thank you for coming in,   Most likely you have carpal tunnel.   You can take ibuprofen or tylenol for pain.   You can buy a Cock up splint from the pharmacy to wear during work.   Sign up for My Chart to have easy access to your labs results, and communication with your Primary care physician   Please feel free to call with any questions or concerns at any time, at [phone]. --Dr. Raeford Razor  RICE: Routine Care for Injuries The routine care of many injuries includes Rest, Ice, Compression, and Elevation (RICE). HOME CARE INSTRUCTIONS  Rest is needed to allow your body to heal. Routine activities can usually be resumed when comfortable. Injured tendons and bones can take up to 6 weeks to heal. Tendons are the cord-like structures that attach muscle to bone.  Ice following an injury helps keep the swelling down and reduces pain.  Put ice in a plastic bag.  Place a towel between your skin and the bag.  Leave the ice on for 15-20 minutes, 3-4 times a day, or as directed by your health care provider. Do this while awake, for the first 24 to 48 hours. After that, continue as directed by your caregiver.  Compression helps keep swelling down. It also gives support and helps with discomfort. If an elastic bandage has been applied, it should be removed and reapplied every 3 to 4 hours. It should not be applied tightly, but firmly enough to keep swelling down. Watch fingers or toes for swelling, bluish discoloration, coldness, numbness, or excessive pain. If any of these problems occur, remove the bandage and reapply loosely. Contact your caregiver if these problems continue.  Elevation helps reduce swelling and decreases pain. With extremities, such as the arms, hands, legs, and feet, the injured area should be placed near or above the level of the heart, if possible. SEEK IMMEDIATE MEDICAL CARE IF:  You have persistent pain and swelling.  You develop redness, numbness, or unexpected  weakness.  Your symptoms are getting worse rather than improving after several days. These symptoms may indicate that further evaluation or further X-rays are needed. Sometimes, X-rays may not show a small broken bone (fracture) until 1 week or 10 days later. Make a follow-up appointment with your caregiver. Ask when your X-ray results will be ready. Make sure you get your X-ray results. Document Released: 04/07/2000 Document Revised: 12/29/2012 Document Reviewed: 05/25/2010 [facility] Patient Information 2015 [place], [place]. This information is not intended to replace advice given to you by your health care provider. Make sure you discuss any questions you have with your health care provider.

## 2014-10-03 ENCOUNTER — Ambulatory Visit (INDEPENDENT_AMBULATORY_CARE_PROVIDER_SITE_OTHER): Payer: Self-pay | Admitting: Pharmacist

## 2014-10-03 ENCOUNTER — Encounter: Payer: Self-pay | Admitting: Pharmacist

## 2014-10-03 VITALS — BP 145/84 | HR 85 | Wt 209.5 lb

## 2014-10-03 DIAGNOSIS — I10 Essential (primary) hypertension: Secondary | ICD-10-CM

## 2014-10-03 DIAGNOSIS — E1165 Type 2 diabetes mellitus with hyperglycemia: Secondary | ICD-10-CM

## 2014-10-03 DIAGNOSIS — IMO0002 Reserved for concepts with insufficient information to code with codable children: Secondary | ICD-10-CM

## 2014-10-03 DIAGNOSIS — E118 Type 2 diabetes mellitus with unspecified complications: Secondary | ICD-10-CM

## 2014-10-03 MED ORDER — ATORVASTATIN CALCIUM 40 MG PO TABS: 40.0000 mg | ORAL_TABLET | Freq: Every day | ORAL | Status: DC

## 2014-10-03 MED ORDER — INSULIN ASPART 100 UNIT/ML ~~LOC~~ SOLN: 45.0000 [IU] | Freq: Two times a day (BID) | SUBCUTANEOUS | Status: DC

## 2014-10-03 MED ORDER — LISINOPRIL 10 MG PO TABS: 10.0000 mg | ORAL_TABLET | Freq: Every day | ORAL | Status: DC

## 2014-10-03 NOTE — Progress Notes (Signed)
S:    Patient arrives in good spirits.  Presents for diabetes follow up and Trulicity injection training. Patient reports Diabetes was diagnosed in 2010.   Patient reports adherence with diabetes medications, but is currently not taking atorvastatin, lisinopril, and gabapentin.  Pt states is not taking atorvastatin and lisinopril due to waiting for shipment from manufacturer [facility].  Pt states not taking gabapentin due to it not working and it causing drowsiness. She currently only takes aspirin approximately three times per week. Current diabetes medications include NPH 70 units BID, Novolog 45 units with meals, and Trulicity.  Patient received Trulicity from [facility] but has not started it yet.  Pt does not use a pill box to remember her meds and states that pill boxes are ineffective for her.   Pt was recently switched from NPH to lantus and was nonresponsive to the change with BG increasing to the 300-400s. Pt was switched back to NPH/Novolog regimen and pt states BG have decreased to the low 200s and high 100s.    Patient denies hypoglycemic events.  Patient reports nocturia ~3x/night (decreased from 10-15x/night) Patient reports neuropathy in her arms, hands, and legs and denies any improvement with gabapentin.  Patient reports visual changes. Patient reports self foot exams.   Pt states that BP is much lower at home with most readings ~120/80s.   O:  Lab Results  Component Value Date   HGBA1C 14.5 04/15/2014   Filed Vitals:   10/03/14 0850  BP: 145/84  Pulse: 85     Home fasting CBG: 300-400s when on Novolog and Lantus, improved to 200s since changing back to Novolog and Humulin   A/P: Diabetes diagnosed in 2010 currently improved.   Patient denies hypoglycemic events and is able to verbalize appropriate hypoglycemia management plan.  Patient reports adherence with insulin. Control is suboptimal due to metabolic syndrome with insulin resistance and nonresponse to NPH>Lantus switch.  Started Trulicity (dulaglutide) 0.75 mg weekly (obtained through [facility]). Counseled pt on proper Trulicity injection technique and pt administered first dose in clinic. Counseled pt on possible GI SE.  Instructed pt to continue Humulin N 70 units BID before meals and Novolog 45 units BID before meals. Instructed pt to call clinic if experiences hypoglycemic events which may require insulin dose titration after starting trulicity. Supplied pt with two 10 mL Novolog vial samples.  Will f/u neuropathy at next visit and would expect pain to improve as BG control improves. Next A1C anticipated in one month.    HTN: BP currently slightly above goal due to pt not having medications.  Encouraged pt to pick up Atorvastatin and Lisinopril from health department using orange card $6 list until pt receives medications from manufacturer. Will f/u BP control at next visit.  Written patient instructions provided.  Total time in face to face counseling 30 minutes.   Follow up in Pharmacist Clinic Visit 2 weeks.   Patient seen with Roxy Manns, PharmD Candidate, Bennye Alm, PharmD Resident and Elisabeth Most, PharmD, resident.

## 2014-10-03 NOTE — Progress Notes (Signed)
Patient ID: Margaret Larsen, female   DOB: 10-08-1970, 44 y.o.   MRN: [id number] Reviewed: I agree with Dr. Graylin Shiver documentation and management.

## 2014-10-03 NOTE — Assessment & Plan Note (Signed)
Diabetes diagnosed in 2010 currently improved.   Patient denies hypoglycemic events and is able to verbalize appropriate hypoglycemia management plan.  Patient reports adherence with insulin. Control is suboptimal due to metabolic syndrome with insulin resistance and nonresponse to NPH>Lantus switch. Started Trulicity (dulaglutide) 0.75 mg weekly (obtained through [facility]). Counseled pt on proper Trulicity injection technique and pt administered first dose in clinic. Counseled pt on possible GI SE.  Instructed pt to continue Humulin N 70 units BID before meals and Novolog 45 units BID before meals. Instructed pt to call clinic if experiences hypoglycemic events which may require insulin dose titration after starting trulicity. Supplied pt with two 10 mL Novolog vial samples.  Will f/u neuropathy at next visit and would expect pain to improve as BG control improves. Next A1C anticipated in one month.

## 2014-10-03 NOTE — Patient Instructions (Signed)
Start Trulicity 0.75mg  weekly, first injection in clinic today, take second injection next Monday, October 3rd.  Continue Humulin 70 units twice daily and Novolog 45 units twice daily before meals.  Check your blood glucose every morning and before meals.   Pick up a one month supply of lisinopril and atorvastatin using your orange card while you wait for your supply from [facility] to be delivered.   Follow up with Dr. Valentina Lucks in 2 weeks.  Bring your meter to clinic for your next visit.

## 2014-10-03 NOTE — Assessment & Plan Note (Signed)
HTN: BP currently slightly above goal due to pt not having medications.  Encouraged pt to pick up Atorvastatin and Lisinopril from health department using orange card $6 list until pt receives medications from manufacturer. Will f/u BP control at next visit.

## 2014-10-11 ENCOUNTER — Telehealth: Payer: Self-pay | Admitting: *Deleted

## 2014-10-11 MED ORDER — ROSUVASTATIN CALCIUM 20 MG PO TABS: 20.0000 mg | ORAL_TABLET | Freq: Every day | ORAL | Status: DC

## 2014-10-11 NOTE — Telephone Encounter (Signed)
Please let the patient know I placed an order for Crestor to be "faxed" to the [facility] program.  Thanks, Archie Patten, MD [facility] Resident  10/11/2014, 5:50 PM

## 2014-10-11 NOTE — Telephone Encounter (Signed)
Received a fax from [facility] stating they could change Atorvastatin to Crestor 20 mg at no charge for the patient.  If ok to change please fax a new Rx.  Derl Barrow, RN

## 2014-10-12 ENCOUNTER — Other Ambulatory Visit: Payer: Self-pay | Admitting: Family Medicine

## 2014-10-12 DIAGNOSIS — E118 Type 2 diabetes mellitus with unspecified complications: Principal | ICD-10-CM

## 2014-10-12 DIAGNOSIS — IMO0002 Reserved for concepts with insufficient information to code with codable children: Secondary | ICD-10-CM

## 2014-10-12 DIAGNOSIS — E1165 Type 2 diabetes mellitus with hyperglycemia: Secondary | ICD-10-CM

## 2014-10-12 DIAGNOSIS — Z794 Long term (current) use of insulin: Principal | ICD-10-CM

## 2014-10-12 MED ORDER — INSULIN NPH (HUMAN) (ISOPHANE) 100 UNIT/ML ~~LOC~~ SUSP: 70.0000 [IU] | Freq: Two times a day (BID) | SUBCUTANEOUS | Status: DC

## 2014-10-12 MED ORDER — INSULIN ASPART 100 UNIT/ML ~~LOC~~ SOLN: 45.0000 [IU] | Freq: Two times a day (BID) | SUBCUTANEOUS | Status: DC

## 2014-10-24 ENCOUNTER — Encounter: Payer: Self-pay | Admitting: Family Medicine

## 2014-10-24 ENCOUNTER — Ambulatory Visit (INDEPENDENT_AMBULATORY_CARE_PROVIDER_SITE_OTHER): Payer: Self-pay | Admitting: Family Medicine

## 2014-10-24 VITALS — BP 134/87 | HR 94 | Temp 98.3°F | Wt 206.0 lb

## 2014-10-24 DIAGNOSIS — K460 Unspecified abdominal hernia with obstruction, without gangrene: Secondary | ICD-10-CM

## 2014-10-24 DIAGNOSIS — K436 Other and unspecified ventral hernia with obstruction, without gangrene: Secondary | ICD-10-CM

## 2014-10-24 DIAGNOSIS — K439 Ventral hernia without obstruction or gangrene: Secondary | ICD-10-CM

## 2014-10-24 NOTE — Progress Notes (Signed)
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 She recently quit this job 2 weeks ago. She denies any single episode of heavy strain/lifting in which she had acute abdominal pain resulting during or after. Denies trauma or falls.  Review of Symptoms - see HPI PMH - Smoking status noted.    Objective: BP 134/87 mmHg  Pulse 94  Temp(Src) 98.3 F (36.8 C) (Oral)  Wt 206 lb (93.441 kg) Gen: NAD, alert, cooperative, and pleasant. CV: RRR, no murmur Resp: CTAB, no wheezes, non-labored Abd: Soft, obese, non-distended, tenderness and 4x4cm midline mass noted ~5cm superior to the umbilicus, non-reducible, BS present, no guarding or organomegaly Ext: No edema, warm  Assessment and plan:  Irreducible ventral hernia Patient with signs and symptoms consistent with an irreducible ventral hernia. Pain and mass noted at the site of herniation approximately 7 days ago. Pain increased dramatically over the weekend but improved prior to our visit today. Patient denies any symptoms of vascular compromise to the site/acute abdomen. - Gen. surgery referral placed today for assessment and consideration for surgical hernia repair - Patient strongly advised to avoid any and all heavy lifting, squatting, or other activities that could increase intra-abdominal pressure.    Orders Placed This Encounter  Procedures  . Ambulatory referral to General Surgery    Referral Priority:  Routine    Referral Type:  Surgical   Referral Reason:  Specialty Services Required    Requested Specialty:  General Surgery    Number of Visits Requested:  1    Elberta Leatherwood, MD,MS,  PGY2 10/25/2014 1:14 PM

## 2014-10-24 NOTE — Patient Instructions (Signed)
It was a pleasure seeing you today in our clinic. Today we discussed your hernia. Here is the treatment plan we have discussed and agreed upon together:   - I have sent a referral out to General Surgery. - Report to the Emergency Department if your pain becomes overwhelming, you develop a fever, or feel severely ill.

## 2014-10-25 DIAGNOSIS — K439 Ventral hernia without obstruction or gangrene: Secondary | ICD-10-CM | POA: Insufficient documentation

## 2014-10-25 DIAGNOSIS — K436 Other and unspecified ventral hernia with obstruction, without gangrene: Secondary | ICD-10-CM | POA: Insufficient documentation

## 2014-10-25 NOTE — Assessment & Plan Note (Signed)
Patient with signs and symptoms consistent with an irreducible ventral hernia. Pain and mass noted at the site of herniation approximately 7 days ago. Pain increased dramatically over the weekend but improved prior to our visit today. Patient denies any symptoms of vascular compromise to the site/acute abdomen. - Gen. surgery referral placed today for assessment and consideration for surgical hernia repair - Patient strongly advised to avoid any and all heavy lifting, squatting, or other activities that could increase intra-abdominal pressure.

## 2014-11-03 ENCOUNTER — Encounter: Payer: Self-pay | Admitting: Pharmacist

## 2014-11-03 ENCOUNTER — Ambulatory Visit (INDEPENDENT_AMBULATORY_CARE_PROVIDER_SITE_OTHER): Payer: Self-pay | Admitting: Pharmacist

## 2014-11-03 VITALS — BP 132/97 | HR 96 | Ht 59.0 in | Wt 212.5 lb

## 2014-11-03 DIAGNOSIS — E1165 Type 2 diabetes mellitus with hyperglycemia: Secondary | ICD-10-CM

## 2014-11-03 DIAGNOSIS — I10 Essential (primary) hypertension: Secondary | ICD-10-CM

## 2014-11-03 DIAGNOSIS — E118 Type 2 diabetes mellitus with unspecified complications: Secondary | ICD-10-CM

## 2014-11-03 DIAGNOSIS — E785 Hyperlipidemia, unspecified: Secondary | ICD-10-CM

## 2014-11-03 DIAGNOSIS — IMO0002 Reserved for concepts with insufficient information to code with codable children: Secondary | ICD-10-CM

## 2014-11-03 DIAGNOSIS — Z794 Long term (current) use of insulin: Secondary | ICD-10-CM

## 2014-11-03 NOTE — Assessment & Plan Note (Signed)
Diabetes diagnosed in 2010 currently uncontrolled.   Patient denies hypoglycemic events and is able to verbalize appropriate hypoglycemia management plan.  Patient reports adherence with medication.  Continued NPH 75 units twice a day. Increased dose of rapid insulin Novolog (insulin aspart) to 55 units with meals. Continued Trulicity (dulaglutide) 0.75mg  once a week on Mondays. Will send prescription to [facility] for increased dose of 1.5 mg.  Next A1C consider in 1 month.

## 2014-11-03 NOTE — Progress Notes (Signed)
S:    Patient arrives in good spirits with daughter and granddaughter.  Presents for diabetes follow-up.  Patient reports Diabetes was diagnosed in 2010.   Patient reports adherence with medications. Current diabetes medications include Trulicity, Novolog, NPH.  Patient denies hypoglycemic events.  Patient reported dietary habits: States that she does not eat much throughout the day. She eats chicken and tacos at work. She drinks water and sometimes drinks juices or soda at home.  Patient reported exercise habits: Walks during work at [employer].   Patient denies nocturia. Much improved.  Patient reports neuropathy, but has stopped taking gabapentin as it was not working.  Patient reports visual changes.  Patient reports self foot exams.   Patient reports she has not received her rosuvastatin or lisinopril from the [facility] program at this time. She did receive an approval letter for both medications and should receive them soon.  She does have some apprehension to treating her blood pressure. She states that she doesn't think her blood pressure is high and it is not a problem.    O:  Lab Results  Component Value Date   HGBA1C 14.5 04/15/2014   CBGs average: Breakfast 268, Lunch 328, Dinner 258 (Checks blood glucose without regards to meals)  Most recent lipid panel in 08/2013. LDL 121  A/P: Diabetes diagnosed in 2010 currently uncontrolled.   Patient denies hypoglycemic events and is able to verbalize appropriate hypoglycemia management plan.  Patient reports adherence with medication.  Continued NPH 75 units twice a day. Increased dose of rapid insulin Novolog (insulin aspart) to 55 units with meals. Continued Trulicity (dulaglutide) 0.75mg  once a week on Mondays. Will send prescription to [facility] for increased dose of 1.5 mg.  Next A1C consider in 1 month.    Blood pressure is slightly elevated. She reports nonadherence with lisinopril and does not want to start it at this time. We explained the  benefits of blood pressure control and will address at next visit.    History of elevated LDL cholesterol.  Currently non-adherent with statin tx. Patient is agreeable to treating her hyperlipidemia with Crestor 20mg  for the next 3 months. Will recheck lipid panel at that time and evaluate impact of statin therapy.   Written patient instructions provided.  Total time in face to face counseling 30 minutes.  Follow up in Pharmacist Clinic Visit 1 month.   Patient seen with Viann Fish, PharmD Candidate, Dimitri Ped, PharmD Resident and Elisabeth Most, PharmD Resident.

## 2014-11-03 NOTE — Assessment & Plan Note (Signed)
Patient is agreeable to treating her hyperlipidemia with Crestor 20mg . She states she will take it for at least 3 months. Will recheck lipid panel at next visit.

## 2014-11-03 NOTE — Progress Notes (Signed)
Patient ID: Margaret Larsen, female   DOB: 02/28/1970, 44 y.o.   MRN: [id number] Reviewed: Agree with Dr. Koval's documentation and management. 

## 2014-11-03 NOTE — Assessment & Plan Note (Signed)
Blood pressure is slightly elevated. She reports nonadherence with lisinopril and does not want to start it at this time. We explained the benefits of blood pressure control and will address at next visit.

## 2014-11-03 NOTE — Patient Instructions (Signed)
Increase your Novolog to 55 units with meals. Continue taking NPH 75 units twice a day.   Continue Trulicity 0.75mg  once a week on Mondays for now. We will send a prescription for the 1.5mg  dose to [facility]. You will need to go to the Health Department to complete the paperwork.  Start taking Crestor 20 mg once a day when you receive it from the [facility] program.  Check your blood sugars at home and remember to bring your meter with you at your next visit.  Schedule a follow up appointment with Dr. Valentina Lucks in 1 month.

## 2014-11-03 NOTE — Assessment & Plan Note (Signed)
History of elevated LDL cholesterol.  Currently non-adherent with statin tx. Patient is agreeable to treating her hyperlipidemia with Crestor 20mg  for the next 3 months. Will recheck lipid panel at that time and evaluate impact of statin therapy.

## 2014-11-08 ENCOUNTER — Telehealth: Payer: Self-pay | Admitting: *Deleted

## 2014-11-08 MED ORDER — DESLORATADINE 5 MG PO TABS: 5.0000 mg | ORAL_TABLET | Freq: Every day | ORAL | Status: DC

## 2014-11-08 NOTE — Telephone Encounter (Signed)
[name] with [facility] called stating patient has a Rx for Cetirizine 10 mg one daily.  Please consider changing to Clarinex.  Clarinex is offered for free through [facility].  If changing please send new Rx.  Derl Barrow, RN

## 2014-11-08 NOTE — Telephone Encounter (Signed)
Rx for Clarinex 5mg  faxed to [facility] program.  Thanks, Archie Patten, MD [facility] Resident  11/08/2014, 1:20 PM

## 2014-11-29 ENCOUNTER — Telehealth: Payer: Self-pay | Admitting: *Deleted

## 2014-11-29 NOTE — Telephone Encounter (Signed)
Patient states her orange card and financial assistance has expired as on 11/1 and now she has no way of getting her insulin from [facility]. Patient has been added to list of patients who needs to be called to set up (barabara on LOA and we have no one to set up patients at the moment) but wants to know what she can do about getting her insulin in the meantime.

## 2014-11-30 NOTE — Telephone Encounter (Signed)
Called and patient informed me that she got her Trulicity AND has a supply of insulin.   she is working throught the process of updating her [employer] with a health advocate.   She did not request any additional support from [place] at this time.

## 2014-12-06 ENCOUNTER — Ambulatory Visit: Payer: Self-pay | Admitting: Pharmacist

## 2014-12-29 ENCOUNTER — Ambulatory Visit: Payer: Self-pay

## 2015-01-12 ENCOUNTER — Encounter: Payer: Self-pay | Admitting: Family Medicine

## 2015-01-12 ENCOUNTER — Ambulatory Visit (INDEPENDENT_AMBULATORY_CARE_PROVIDER_SITE_OTHER): Payer: Self-pay | Admitting: Family Medicine

## 2015-01-12 VITALS — BP 113/80 | HR 84 | Temp 97.8°F | Wt 197.7 lb

## 2015-01-12 DIAGNOSIS — L989 Disorder of the skin and subcutaneous tissue, unspecified: Secondary | ICD-10-CM | POA: Insufficient documentation

## 2015-01-12 NOTE — Patient Instructions (Signed)
Excision of Skin Lesions, Care After °Refer to this sheet in the next few weeks. These instructions provide you with information about caring for yourself after your procedure. Your health care provider may also give you more specific instructions. Your treatment has been planned according to current medical practices, but problems sometimes occur. Call your health care provider if you have any problems or questions after your procedure. °WHAT TO EXPECT AFTER THE PROCEDURE °After your procedure, it is common to have pain or discomfort at the excision site. °HOME CARE INSTRUCTIONS °· Take over-the-counter and prescription medicines only as told by your health care provider. °· Follow instructions from your health care provider about: °¨ How to take care of your excision site. You should keep the site clean, dry, and protected for at least 48 hours. °¨ When and how you should change your bandage (dressing). °¨ When you should remove your dressing. °¨ Removing whatever was used to close your excision site. °· Check the excision area every day for signs of infection. Watch for: °¨ Redness, swelling, or pain. °¨ Fluid, blood, or pus. °· For bleeding, apply gentle but firm pressure to the area using a folded towel for 20 minutes. °· Avoid high-impact exercise and activities until the stitches (sutures) are removed or the area heals. °· Follow instructions from your health care provider about how to minimize scarring. Avoid sun exposure until the area has healed. Scarring should lessen over time. °· Keep all follow-up visits as told by your health care provider. This is important. °SEEK MEDICAL CARE IF: °· You have a fever. °· You have redness, swelling, or pain at the excision site. °· You have fluid, blood, or pus coming from the excision site. °· You have ongoing bleeding at the excision site. °· You have pain that does not improve in 2-3 days after your procedure. °· You notice skin irregularities or changes in  sensation. °  °This information is not intended to replace advice given to you by your health care provider. Make sure you discuss any questions you have with your health care provider. °  °Document Released: 05/10/2014 Document Reviewed: 05/10/2014 °Elsevier Interactive Patient Education ©2016 [employer] ° °

## 2015-01-16 NOTE — Assessment & Plan Note (Signed)
Inflamed lesion at nape of neck, sore and itchy for 3 months, getting bigger, looks like skin tag vs benign nevus, several others present but none bothersome or growing per patient - removed inflamed lesion with punch biopsy, sent for pathology - biopsy care instrctions given - f/u if other problematic lesions or pain, redness, swelling, drainage at site

## 2015-01-16 NOTE — Progress Notes (Signed)
   Subjective:   Margaret Larsen is a 45 y.o. female with a history of dm here for skin lesion  Patient reports she noticed a bump on the back o her neck about 2-3 months ago. Since then she thinks it has gotten bigger and become sore and itchy/irritated. She has other moles but none that bother her or seem to be growing except this one. No history of skin cancer. Normal amount of sun exposure but not in this area as it is at posterior hairline.  Review of Systems:  Per HPI. All other systems reviewed and are negative.   PMH, PSH, Medications, Allergies, and FmHx reviewed and updated in EMR.  Social History: never smoker  Objective:  BP 113/80 mmHg  Pulse 84  Temp(Src) 97.8 F (36.6 C) (Oral)  Wt 197 lb 11.2 oz (89.676 kg)  Gen:  45 y.o. female in NAD HEENT: NCAT, MMM, EOMI, PERRL, anicteric sclerae CV: RRR, no MRG, no JVD Resp: Non-labored, CTAB, no wheezes noted Abd: Soft, NTND, BS present, no guarding or organomegaly Ext: WWP, no edema MSK: Full ROM, strength intact Neuro: Alert and oriented, speech normal Skin: Scattered brown papules around neck, larger brown papule at nape near hairline which has some irritation      Chemistry      Component Value Date/Time   NA 131* 08/09/2013 1056   K 3.7 08/09/2013 1056   CL 98 08/09/2013 1056   CO2 28 08/09/2013 1056   BUN 5* 08/09/2013 1056   CREATININE 0.6 08/09/2013 1056   CREATININE 0.65 06/18/2013 1525      Component Value Date/Time   CALCIUM 8.7 08/09/2013 1056   ALKPHOS 87 04/10/2012 1342   AST 16 04/10/2012 1342   ALT 9 04/10/2012 1342   BILITOT 0.3 04/10/2012 1342      Lab Results  Component Value Date   WBC 7.1 04/10/2012   HGB 13.6 04/10/2012   HCT 40.1 04/10/2012   MCV 83.2 04/10/2012   PLT 367 04/10/2012   Lab Results  Component Value Date   TSH 3.046 06/10/2011   Lab Results  Component Value Date   HGBA1C 14.5 04/15/2014   Assessment & Plan:     Margaret Larsen is a 45 y.o. female here  for skin lesion  Skin lesion of scalp Inflamed lesion at nape of neck, sore and itchy for 3 months, getting bigger, looks like skin tag vs benign nevus, several others present but none bothersome or growing per patient - removed inflamed lesion with punch biopsy, sent for pathology - biopsy care instrctions given - f/u if other problematic lesions or pain, redness, swelling, drainage at site       Beverlyn Roux, MD, MPH [place] PGY-3 01/16/2015 11:44 PM

## 2015-01-17 ENCOUNTER — Telehealth: Payer: Self-pay | Admitting: *Deleted

## 2015-01-17 NOTE — Telephone Encounter (Signed)
Patient informed, expressed understanding. 

## 2015-01-17 NOTE — Telephone Encounter (Signed)
-----   Message from Frazier Richards, MD sent at 01/17/2015  1:57 PM EST ----- Please inform patient that the lump I removed was a benign mole that does not need any further treatment unless she has other problems with it. Thanks!

## 2015-03-07 LAB — HM DIABETES EYE EXAM

## 2015-04-17 ENCOUNTER — Ambulatory Visit: Payer: Self-pay | Admitting: Pharmacist

## 2015-04-25 ENCOUNTER — Ambulatory Visit (INDEPENDENT_AMBULATORY_CARE_PROVIDER_SITE_OTHER): Payer: Self-pay | Admitting: Family Medicine

## 2015-04-25 ENCOUNTER — Other Ambulatory Visit: Payer: Self-pay

## 2015-04-25 ENCOUNTER — Encounter: Payer: Self-pay | Admitting: Family Medicine

## 2015-04-25 VITALS — BP 126/82 | HR 102 | Temp 98.2°F | Ht 59.0 in | Wt 195.2 lb

## 2015-04-25 DIAGNOSIS — J309 Allergic rhinitis, unspecified: Secondary | ICD-10-CM

## 2015-04-25 MED ORDER — DESLORATADINE 5 MG PO TABS: 5.0000 mg | ORAL_TABLET | Freq: Every day | ORAL | Status: DC

## 2015-04-25 MED ORDER — FLUTICASONE PROPIONATE 50 MCG/ACT NA SUSP: 2.0000 | Freq: Every day | NASAL | Status: DC

## 2015-04-25 NOTE — Patient Instructions (Signed)
Sent in clarinex pills and flonase nasal spray for you Follow up if not better in 1-2 weeks Ok to use cough drops, sore throat spray  Be well, Dr. Ardelia Mems  Allergic Rhinitis Allergic rhinitis is when the mucous membranes in the nose respond to allergens. Allergens are particles in the air that cause your body to have an allergic reaction. This causes you to release allergic antibodies. Through a chain of events, these eventually cause you to release histamine into the blood stream. Although meant to protect the body, it is this release of histamine that causes your discomfort, such as frequent sneezing, congestion, and an itchy, runny nose.  CAUSES Seasonal allergic rhinitis (hay fever) is caused by pollen allergens that may come from grasses, trees, and weeds. Year-round allergic rhinitis (perennial allergic rhinitis) is caused by allergens such as house dust mites, pet dander, and mold spores. SYMPTOMS  Nasal stuffiness (congestion).  Itchy, runny nose with sneezing and tearing of the eyes. DIAGNOSIS Your health care provider can help you determine the allergen or allergens that trigger your symptoms. If you and your health care provider are unable to determine the allergen, skin or blood testing may be used. Your health care provider will diagnose your condition after taking your health history and performing a physical exam. Your health care provider may assess you for other related conditions, such as asthma, pink eye, or an ear infection. TREATMENT Allergic rhinitis does not have a cure, but it can be controlled by:  Medicines that block allergy symptoms. These may include allergy shots, nasal sprays, and oral antihistamines.  Avoiding the allergen. Hay fever may often be treated with antihistamines in pill or nasal spray forms. Antihistamines block the effects of histamine. There are over-the-counter medicines that may help with nasal congestion and swelling around the eyes. Check  with your health care provider before taking or giving this medicine. If avoiding the allergen or the medicine prescribed do not work, there are many new medicines your health care provider can prescribe. Stronger medicine may be used if initial measures are ineffective. Desensitizing injections can be used if medicine and avoidance does not work. Desensitization is when a patient is given ongoing shots until the body becomes less sensitive to the allergen. Make sure you follow up with your health care provider if problems continue. HOME CARE INSTRUCTIONS It is not possible to completely avoid allergens, but you can reduce your symptoms by taking steps to limit your exposure to them. It helps to know exactly what you are allergic to so that you can avoid your specific triggers. SEEK MEDICAL CARE IF:  You have a fever.  You develop a cough that does not stop easily (persistent).  You have shortness of breath.  You start wheezing.  Symptoms interfere with normal daily activities.   This information is not intended to replace advice given to you by your health care provider. Make sure you discuss any questions you have with your health care provider.   Document Released: 09/18/2000 Document Revised: 01/14/2014 Document Reviewed: 08/31/2012 Elsevier Interactive Patient Education [employer].

## 2015-04-25 NOTE — Assessment & Plan Note (Addendum)
History and exam consistent with allergic rhinitis. No signs of bacterial superinfection presently. Well appearing. Will rx flonase and clarinex tabs (on formulary at health dept, patient reports taking these previously without any adverse reaction). Follow up if not improving.

## 2015-04-25 NOTE — Progress Notes (Signed)
Date of Visit: 04/25/2015   HPI:  Patient presents for a same day appointment to discuss allergies.  Since Sunday has had trouble with her voice. Has been coughing, sneezing, and had runny nose. Eyes are occasionally itchy. No fevers. Feels mildly short of breath when she walks outside. The area has had lots and lots of pollen during the last week. Has history of allergic rhinitis but is not on any medications for this right now. Has not tried any medications. Eating and drinking well. Stooling and urinating normally.    Of note on medication list has several allergies listed to allergy-type medications: benadryl (hives, rash) and fexofenadine-pseudoephedrine (heart rate dropped). Has taken clarinex in the past without any problems.  ROS: See HPI  Lewis: history of obesity, poorly controlled type 2 diabetes, OSA, diabetic peripheral neuropathy, hyperlipidemia, hypertension, bipolar disorder, PTSD, GERD  PHYSICAL EXAM: BP 126/82 mmHg  Pulse 102  Temp(Src) 98.2 F (36.8 C) (Oral)  Ht 4\' 11"  (1.499 m)  Wt 195 lb 3.2 oz (88.542 kg)  BMI 39.40 kg/m2 Gen: NAD, pleasant, cooperative, well appearing HEENT: normocephalic, atraumatic, moist mucous membranes, oropharynx clear and moist, tympanic membranes clear bilaterally, No anterior cervical or supraclavicular lymphadenopathy. Nares patent Heart: regular rate and rhythm, no murmur Lungs: clear to auscultation bilaterally, normal work of breathing, no crackles or wheezes Neuro: alert, grossly nonfocal, speech normal  ASSESSMENT/PLAN:  Allergic rhinitis History and exam consistent with allergic rhinitis. No signs of bacterial superinfection presently. Well appearing. Will rx flonase and clarinex tabs (on formulary at health dept, patient reports taking these previously without any adverse reaction). Follow up if not improving.   FOLLOW UP: Follow up as needed if symptoms worsen or fail to improve.    [place]. Ardelia Mems, [place]

## 2015-05-01 ENCOUNTER — Ambulatory Visit: Payer: Self-pay | Admitting: Family Medicine

## 2015-05-16 ENCOUNTER — Ambulatory Visit (HOSPITAL_COMMUNITY)
Admission: EM | Admit: 2015-05-16 | Discharge: 2015-05-16 | Disposition: A | Discharge status: Home / Self Care | Payer: Self-pay | Attending: Emergency Medicine | Admitting: Emergency Medicine

## 2015-05-16 ENCOUNTER — Encounter (HOSPITAL_COMMUNITY): Payer: Self-pay | Admitting: Emergency Medicine

## 2015-05-16 DIAGNOSIS — M545 Low back pain, unspecified: Secondary | ICD-10-CM

## 2015-05-16 DIAGNOSIS — S76312A Strain of muscle, fascia and tendon of the posterior muscle group at thigh level, left thigh, initial encounter: Secondary | ICD-10-CM

## 2015-05-16 DIAGNOSIS — S76012A Strain of muscle, fascia and tendon of left hip, initial encounter: Secondary | ICD-10-CM

## 2015-05-16 MED ORDER — TRAMADOL HCL 50 MG PO TABS: 50.0000 mg | ORAL_TABLET | Freq: Four times a day (QID) | ORAL | Status: DC | PRN

## 2015-05-16 MED ORDER — PREDNISONE 50 MG PO TABS: ORAL_TABLET | ORAL | Status: DC

## 2015-05-16 NOTE — ED Notes (Signed)
Patient c/o LLQ abdominal pain which radiates to her back x 3 days. Patient denies trouble with bowel movements or urinary symptoms. Needs help when going from sitting to standing. Patient is in NAD.

## 2015-05-16 NOTE — Discharge Instructions (Signed)
I suspect you have strained your psoas muscle. Alternate ice and heat to the painful area. Take prednisone daily for 5 days. Watch your sugars carefully while you are on this medicine. Avoid movements that hurt as much as possible. Use the tramadol every 6-8 hours as needed for severe pain. Do not drive while taking this medicine. This should gradually improve over the next 1-2 weeks. Follow-up as needed.

## 2015-05-16 NOTE — ED Provider Notes (Signed)
CSN: [id number]     Arrival date & time 05/16/15  1829 History   First MD Initiated Contact with Patient 05/16/15 1915     Chief Complaint  Patient presents with  . Abdominal Pain   (Consider location/radiation/quality/duration/timing/severity/associated sxs/prior Treatment) HPI She is a 45 year old woman here for evaluation of left sided back pain. She states this started about 3 days ago and has gradually been getting worse. It starts at the left side at the iliac crest and radiates into her back and down to her hip. It is worse with going from sitting to standing as well as rolling over. She has tried ibuprofen and Advil without improvement. She denies any injury or trauma. No nausea or vomiting. No abdominal pain. No dysuria or hematuria. No diarrhea or constipation. No fevers or chills.   Past Medical History  Diagnosis Date  . Diabetes mellitus   . Renal disorder   . Kidney stones   . Hypertension   . Kidney stones   . Hemorrhoids 07/10/2010   Past Surgical History  Procedure Laterality Date  . Cholecystectomy    . Cesarean section    . Tubal ligation    . Fiborids removed     Family History  Problem Relation Age of Onset  . Hypertension Mother   . Diabetes Mother   . Cancer Mother    Social History  Substance Use Topics  . Smoking status: Never Smoker   . Smokeless tobacco: Never Used  . Alcohol Use: No   OB History    No data available     Review of Systems As in history of present illness Allergies  Metformin and related; Peanut-containing drug products; Codeine; Diphenhydramine hcl; Fexofenadine-pseudoephed er; and Shellfish allergy  Home Medications   Prior to Admission medications   Medication Sig Start Date End Date Taking? Authorizing Provider  ACCU-CHEK FASTCLIX LANCETS MISC 1 each by Does not apply route 3 (three) times daily. ICD 9 250.92 06/29/13   Boykin Nearing, MD  albuterol (PROVENTIL HFA;VENTOLIN HFA) 108 (90 BASE) MCG/ACT inhaler Inhale 2  puffs into the lungs every 6 (six) hours as needed for wheezing. 07/29/14 07/29/15  Zenia Resides, MD  aspirin 81 MG EC tablet Take 81 mg by mouth daily.     Historical Provider, MD  Blood Glucose Monitoring Suppl (AGAMATRIX PRESTO PRO METER) DEVI 1 Device by Does not apply route once. 06/30/14   Zenia Resides, MD  desloratadine (CLARINEX) 5 MG tablet Take 1 tablet (5 mg total) by mouth daily. 04/25/15   Leeanne Rio, MD  Dulaglutide (TRULICITY) [id number] 0000000 SOPN Inject 0.75 mg into the skin once a week. Patient taking differently: Inject 0.75 mg into the skin once a week. Mondays 07/29/14   Zenia Resides, MD  EPINEPHrine (EPI-PEN) 0.3 mg/0.3 mL DEVI Inject 0.3 mLs (0.3 mg total) into the muscle as needed. For allergic reaction Patient not taking: Reported on 06/07/2014 08/05/11   Katherina Mires, MD  fluticasone [facility]) 50 MCG/ACT nasal spray Place 2 sprays into both nostrils daily. 04/25/15   Leeanne Rio, MD  gabapentin (NEURONTIN) 300 MG capsule Take 2 tablets (600mg ) at bedtime and 1 tablet (300mg ) in the morning Patient not taking: Reported on 10/03/2014 07/29/14   Zenia Resides, MD  glucose blood (AGAMATRIX PRESTO TEST) test strip Use as instructed 06/30/14   Zenia Resides, MD  insulin aspart (NOVOLOG) 100 UNIT/ML injection Inject 45 Units into the skin 2 (two) times daily before a meal.  Patient taking differently: Inject 45 Units into the skin 2 (two) times daily with a meal. Usually only eats once a day, and uses once a day 10/12/14   Archie Patten, MD  insulin NPH Human (NOVOLIN N) 100 UNIT/ML injection Inject 0.7 mLs (70 Units total) into the skin 2 (two) times daily before a meal. Patient taking differently: Inject 75 Units into the skin 2 (two) times daily before a meal.  10/12/14   Archie Patten, MD  Insulin Syringe-Needle U-100 (INSULIN SYRINGE .3CC/31GX5/16") 31G X 5/16" 0.3 ML MISC Use 3 needles per day 06/28/13   Elayne Snare, MD  lisinopril (PRINIVIL,ZESTRIL)  10 MG tablet Take 1 tablet (10 mg total) by mouth daily. Patient not taking: Reported on 11/03/2014 10/03/14   Zenia Resides, MD  lurasidone (LATUDA) 40 MG TABS Take 40 mg by mouth at bedtime.     Historical Provider, MD  mometasone (NASONEX) 50 MCG/ACT nasal spray Place 2 sprays into the nose daily. 07/29/14   Zenia Resides, MD  predniSONE (DELTASONE) 50 MG tablet Take 1 pill daily for 5 days. 05/16/15   Melony Overly, MD  rosuvastatin (CRESTOR) 20 MG tablet Take 1 tablet (20 mg total) by mouth daily. Patient not taking: Reported on 11/03/2014 10/11/14   Archie Patten, MD  traMADol (ULTRAM) 50 MG tablet Take 1 tablet (50 mg total) by mouth every 6 (six) hours as needed. 05/16/15   Melony Overly, MD   Meds Ordered and Administered this Visit  Medications - No data to display  BP 134/87 mmHg  Pulse 88  Temp(Src) 98.4 F (36.9 C) (Oral)  Resp 16  SpO2 99% No data found.   Physical Exam  Constitutional: She is oriented to person, place, and time. She appears well-developed and well-nourished. No distress.  Cardiovascular: Normal rate.   Pulmonary/Chest: Effort normal.  Abdominal: Soft. Bowel sounds are normal. She exhibits no distension and no mass. There is no tenderness. There is no rebound and no guarding.  Musculoskeletal:  Back: No erythema or edema. No vertebral tenderness or step-offs. She is tender along the superior aspect of the iliac crest around to the ASIS. Pain is worse at the ASIS. She does have discomfort with hip flexion. As well as rotation of the hip.  Neurological: She is alert and oriented to person, place, and time.    ED Course  Procedures (including critical care time)  Labs Review Labs Reviewed - No data to display  Imaging Review No results found.   MDM   1. Left-sided low back pain without sciatica   2. Psoas muscle strain, left, initial encounter    History and exam is not consistent with urinary pathology or renal stone. No abdominal pain to  suggest diverticulitis or other intra-abdominal process. I suspect this is musculoskeletal. Symptomatic treatment with ice/heat and prednisone. Tramadol as needed for severe pain. Expect gradual improvement over the next 1-2 weeks. Follow-up as needed.    Melony Overly, MD 05/16/15 2015

## 2015-05-19 ENCOUNTER — Ambulatory Visit (INDEPENDENT_AMBULATORY_CARE_PROVIDER_SITE_OTHER): Payer: Self-pay | Admitting: Pharmacist

## 2015-05-19 ENCOUNTER — Encounter: Payer: Self-pay | Admitting: Pharmacist

## 2015-05-19 ENCOUNTER — Ambulatory Visit (INDEPENDENT_AMBULATORY_CARE_PROVIDER_SITE_OTHER): Payer: Self-pay | Admitting: Family Medicine

## 2015-05-19 VITALS — BP 130/91 | HR 86 | Wt 202.0 lb

## 2015-05-19 VITALS — BP 130/91 | HR 86 | Wt 202.1 lb

## 2015-05-19 DIAGNOSIS — E7800 Pure hypercholesterolemia, unspecified: Secondary | ICD-10-CM

## 2015-05-19 DIAGNOSIS — IMO0002 Reserved for concepts with insufficient information to code with codable children: Secondary | ICD-10-CM

## 2015-05-19 DIAGNOSIS — I10 Essential (primary) hypertension: Secondary | ICD-10-CM

## 2015-05-19 DIAGNOSIS — E785 Hyperlipidemia, unspecified: Secondary | ICD-10-CM

## 2015-05-19 DIAGNOSIS — Z794 Long term (current) use of insulin: Secondary | ICD-10-CM

## 2015-05-19 DIAGNOSIS — Z114 Encounter for screening for human immunodeficiency virus [HIV]: Secondary | ICD-10-CM

## 2015-05-19 DIAGNOSIS — E118 Type 2 diabetes mellitus with unspecified complications: Secondary | ICD-10-CM

## 2015-05-19 DIAGNOSIS — E1165 Type 2 diabetes mellitus with hyperglycemia: Secondary | ICD-10-CM

## 2015-05-19 LAB — LIPID PANEL
CHOL/HDL RATIO: 4.6 ratio (ref <=5.0)
Cholesterol: 207 mg/dL — ABNORMAL HIGH (ref 125–200)
HDL: 45 mg/dL — ABNORMAL LOW (ref >=46)
LDL CALC: 135 mg/dL — AB (ref <130)
Triglycerides: 133 mg/dL (ref <150)
VLDL: 27 mg/dL (ref <30)

## 2015-05-19 LAB — COMPREHENSIVE METABOLIC PANEL
ALT: 8 U/L (ref 6–29)
AST: 12 U/L (ref 10–35)
Albumin: 3.5 g/dL — ABNORMAL LOW (ref 3.6–5.1)
Alkaline Phosphatase: 104 U/L (ref 33–115)
BUN: 9 mg/dL (ref 7–25)
CHLORIDE: 98 mmol/L (ref 98–110)
CO2: 27 mmol/L (ref 20–31)
Calcium: 8.5 mg/dL — ABNORMAL LOW (ref 8.6–10.2)
Creat: 0.54 mg/dL (ref 0.50–1.10)
GLUCOSE: 263 mg/dL — AB (ref 65–99)
POTASSIUM: 4 mmol/L (ref 3.5–5.3)
SODIUM: 133 mmol/L — AB (ref 135–146)
Total Bilirubin: 0.3 mg/dL (ref 0.2–1.2)
Total Protein: 6.9 g/dL (ref 6.1–8.1)

## 2015-05-19 LAB — POCT GLYCOSYLATED HEMOGLOBIN (HGB A1C): HEMOGLOBIN A1C: 11.5

## 2015-05-19 LAB — HIV ANTIBODY (ROUTINE TESTING W REFLEX): HIV: NONREACTIVE

## 2015-05-19 MED ORDER — PRAVASTATIN SODIUM 80 MG PO TABS: 80.0000 mg | ORAL_TABLET | Freq: Every day | ORAL | Status: DC

## 2015-05-19 NOTE — Assessment & Plan Note (Signed)
  Hypertension longstanding.  Patient denies adherence with medication. Control is suboptimal due to lack of adherence and patient not understanding the importance of blood pressure control. Previously prescribed lisinopril 10mg  daily but patient reports not taking medication. Restart lisinopril 10mg  daily.

## 2015-05-19 NOTE — Assessment & Plan Note (Signed)
Diabetes longstanding. Patient reports hypoglycemic events after her Novolog injections but says that this only happens when she forgets to eat. Patient is able to verbalize appropriate hypoglycemia management plan. Patient denies adherence with medication. States that she missed 1-2 doses of insulin a week. Control is suboptimal due to lack of adherence likely as a result of lack of motivation and problems obtaining medications.    Continued insulin NPH (NOVOLIN) from 70 units twice daily before meals. Increased dose of insulin aspart (NOVOLOG) from 40 units twice daily before meals to 50 units twice daily before meals. Continued Trulicity (dulaglutide) 1.5mg  weekly.   A1C today 05/19/15: 11.5.

## 2015-05-19 NOTE — Progress Notes (Signed)
Patient ID: Margaret Larsen, female   DOB: [birth year], 45 y.o.   MRN: [id number]    Subjective: CC: f/u DM and HTN  HPI: Patient is a 45 y.o. female with a past medical history of T2DM, HTN, neuropathy, and OSA, among other things presenting to clinic today for DM and HTN. She also had an appt scheduled with Dr. Valentina Lucks this morning because she wanted to "get everything out of the way."   T2DM: - currently prescribed Novolin 70 units twice daily before meals, Novolog 40 units twice daily before meals, Trulicity 1.5mg  once a week. Having issues with getting Trulicity, concerned she's in the "gap" - does admit to missing 1-2 doses of insulin per week  - reports CBGs are around 100-240, initially states she checks twice daily, but later states she checks less frequently than this.  - Denies polyuria, polydipsia, and nocturia. - has been on U-500 concentrated regular insulin, which gave her hypoglycemic (although Im not sure if this was true hypoglycemia or if she just felt shaky with CBGs in the 300s) > she would not mind trying U500 again as long as she doesn't get the same hypoglycemic feeling.  - has been on metformin in the past, with intolerable GI upset - she was also prescribed Lipitor and ASA but never refilled this. - she works at [employer] and notes she's on her feet from 7:30-4:30, but doesn't partake in regular exercise.  - she drinks diet sodas. Notes she eats fast food regularly because she "has to eat to take her insulin." she state she gets "eveything on the menu" when she goes. Not watching fried foods or carbohydrates. - also noting nodules in her abdomen.  Hypertension Blood pressure at home: doesn't check Blood pressure today: 130/91 Meds: Has not been taking lisinopril as she notes that she doesn't believe her BP is high (she's not having spots in her vision, etc) Side effects: N/A ROS: Denies headache, dizziness, visual changes, nausea, vomiting, chest pain, abdominal pain or  shortness of breath.  Social History: non-smoker  Health Maintenance: lipids, foot exam, and HIV due.  ROS: All other systems reviewed and are negative besides that noted in HPI.  Past Medical History Patient Active Problem List   Diagnosis Date Noted  . Allergic rhinitis 04/25/2015  . Skin lesion of scalp 01/12/2015  . Hyperlipidemia 11/03/2014  . 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 Hand pain 09/21/2014  . Atypical squamous cells of undetermined significance (ASCUS) on Papanicolaou smear of cervix 03/22/2013  . At risk for healthcare associated infection 03/15/2013  . GERD (gastroesophageal reflux disease) 11/13/2012  . Glaucoma suspect of both eyes 06/25/2012  . OSA (obstructive sleep apnea) 10/18/2010  . Diabetic peripheral neuropathy ([place]) 01/23/2010  . PERSONAL HISTORY OF ALLERGY TO PEANUTS 11/02/2009  . PTSD 07/31/2009  . Uncontrolled type 2 diabetes mellitus with complication ([place]) 123456  . HYPERTENSION, BENIGN ESSENTIAL 11/12/2007  . DYSLIPIDEMIA 10/02/2007  . BIPOLAR DISORDER UNSPECIFIED 08/11/2007  . OBESITY, NOS 03/06/2006    Medications- reviewed and updated Current Outpatient Prescriptions  Medication Sig Dispense Refill  . ACCU-CHEK FASTCLIX LANCETS MISC 1 each by Does not apply route 3 (three) times daily. ICD 9 250.92 102 each 11  . albuterol (PROVENTIL HFA;VENTOLIN HFA) 108 (90 BASE) MCG/ACT inhaler Inhale 2 puffs into the lungs every 6 (six) hours as needed for wheezing. 1 Inhaler 3  . aspirin 81 MG EC tablet Take 81 mg by mouth daily. Reported on 05/19/2015    . Blood Glucose  Monitoring Suppl (AGAMATRIX PRESTO PRO METER) DEVI 1 Device by Does not apply route once. 1 Device 0  . desloratadine (CLARINEX) 5 MG tablet Take 1 tablet (5 mg total) by mouth daily. 30 tablet 6  . Dulaglutide (TRULICITY) 1.5 0000000 SOPN Inject 1.5 mg into the skin once a week. Inject 1.5mg  into skin once weekly    . EPINEPHrine (EPI-PEN) 0.3 mg/0.3 mL DEVI Inject 0.3 mLs  (0.3 mg total) into the muscle as needed. For allergic reaction 1 Device 0  . fluticasone (FLONASE) 50 MCG/ACT nasal spray Place 2 sprays into both nostrils daily. 16 g 6  . glucose blood (AGAMATRIX PRESTO TEST) test strip Use as instructed 50 each prn  . insulin aspart (NOVOLOG) 100 UNIT/ML injection Inject 50 Units into the skin 2 (two) times daily before a meal.  0  . insulin NPH Human (NOVOLIN N) 100 UNIT/ML injection Inject 0.7 mLs (70 Units total) into the skin 2 (two) times daily before a meal. 10 mL 11  . Insulin Syringe-Needle U-100 (INSULIN SYRINGE .3CC/31GX5/16") 31G X 5/16" 0.3 ML MISC Use 3 needles per day 100 each 3  . lisinopril (PRINIVIL,ZESTRIL) 10 MG tablet Take 1 tablet (10 mg total) by mouth daily. (Patient not taking: Reported on 11/03/2014) 30 tablet prn  . lurasidone (LATUDA) 20 MG TABS tablet Take 20 mg by mouth at bedtime.    . mometasone (NASONEX) 50 MCG/ACT nasal spray Place 2 sprays into the nose daily. 17 g 12  . pravastatin (PRAVACHOL) 80 MG tablet Take 1 tablet (80 mg total) by mouth daily. 90 tablet 3  . predniSONE (DELTASONE) 50 MG tablet Take 1 pill daily for 5 days. 5 tablet 0  . traMADol (ULTRAM) 50 MG tablet Take 1 tablet (50 mg total) by mouth every 6 (six) hours as needed. (Patient not taking: Reported on 05/19/2015) 20 tablet 0   No current facility-administered medications for this visit.    Objective: Office vital signs reviewed. BP 130/91 mmHg  Pulse 86  Wt 202 lb (91.627 kg)   Physical Examination:  General: Awake, alert, well- nourished, NAD Cardio: RRR, no m/r/g noted.  Pulm: No increased WOB.  CTAB, without wheezes, rhonchi or crackles noted.  Skin: hyperpigmentation with underlying subcutaneous nodule noted over the left part of the abdomen.  Foot exam: decreased sensation on monofilament testing over the midfoot (plantar aspect), heel, and base of the 1st MTP more prominent on the right than the left.   A1c 11.5    Assessment/Plan: HYPERTENSION, BENIGN ESSENTIAL Long-standing. Suboptimal control due to poor compliance. Discussed that she may not feel symptoms of elevated BPs, however it can still cause damage to vital organs such as kidneys and heart.  -advised pt to restart lisinopril 10mg  - CMET today - pt to f/u in 2-3 weeks with Dr. Valentina Lucks (discussed that pt should try to alternate between seeing myself and Dr. Valentina Lucks).   Uncontrolled type 2 diabetes mellitus with complication Poorly controlled. Suspect there is a combination of requiring more medication, noncompliance (not taking insulin 1-2x/week), and dietary indiscretion.= - Continue insulin NPH  from 70 units twice daily before meals.  - Increase dose of Novolog from 40 units twice daily before meals to 50 units twice daily before meals.  - Continue Trulicity 1.5mg  weekly.  - foot exam completed today - discussed the importance of taking lisinopril for renal protection, pravastatin, and ASA.  - discussed importance of checking CBGs at least twice daily and keeping a log. Asked her to bring  in glucometer. - could consider nutrition consult in the future, however at this time I would be happy just getting her in regularly to see myself or Dr. Valentina Lucks. - f/u with Dr. Valentina Lucks in approximately 2 weeks, and myself in 1 month - advised about changing insulin administration sites and discussed insulin lipodystrophy.   Health maintenance: obtain lipid panel and HIV  Orders Placed This Encounter  Procedures  . Comprehensive metabolic panel  . Lipid panel  . HIV antibody (with reflex)  . POCT A1C    No orders of the defined types were placed in this encounter.    Archie Patten PGY-2, [place]

## 2015-05-19 NOTE — Progress Notes (Signed)
Patient ID: Margaret Larsen, female   DOB: 09/13/1970, 44 y.o.   MRN: [id number] Reviewed: Agree with Dr. Koval's documentation and management. 

## 2015-05-19 NOTE — Patient Instructions (Addendum)
Thank you for coming in today! I was very nice to see you.  Start taking aspirin 81mg  daily  Start taking lisinopril 10mg  daily, this has been reordered and you can pick it up from the health department  Start taking pravastatin 80mg  daily, this is also ready for pick-up  Continue taking Trulicity 1.5mg  weekly, this is also ready for pick-up  Increase Novolog dose from 40 units twice daily before meals to 50 units twice daily before meals Continue taking Novolin 70 units twice daily before meals, this is also ready for pick-up  Next follow-up appointment in 2-3 weeks with Dr. Valentina Lucks.

## 2015-05-19 NOTE — Progress Notes (Signed)
S:    Patient arrives in good spirits ambulating on her own.  Presents for diabetes follow-up. Patient was last seen by Primary Care Provider on 04/25/15. Patient reports Diabetes was diagnosed in 2010.   Patient denies adherence with medications.  Current diabetes medications include: Novolin 70 units twice daily before meals, Novolog 40 units twice daily before meals, Trulicity 1.5mg  once a week. Current hypertension medications include: Patient not currently taking lisinopril  Patient reports hypoglycemic events after doses of Novolog. Reports lowest value in the mid-70s.  Patient reported dietary habits: eats 1-2 meals/day Breakfast: muffins, cold starbucks coffees, mcdonalds iced coffees (skips commonly) Lunch: commonly skips or eats a salad or sandwich at work Dinner: pizza, chicken, hamburger, if hungry then will stop at [facility] and get fried chicken with 2 sides, varies Snacks: snacks more then eats, chips, crackers, cookies Drinks: drinks diet coke or diet root beer with meals (2 per day)  Patient reported exercise habits: has recently increased walking because car has been broken   Patient denies nocturia.  Patient denies neuropathy. Patient denies visual changes. Patient denies self foot exams.   O:  Lab Results  Component Value Date   HGBA1C 14.5 04/15/2014   There were no vitals filed for this visit.  Home CBG range per patient: low 200s - up to 240, has seen a few values in the 100s  10 year ASCVD risk: 15.3%.  A/P: Diabetes longstanding. Patient reports hypoglycemic events after her Novolog injections but says that this only happens when she forgets to eat. Patient is able to verbalize appropriate hypoglycemia management plan. Patient denies adherence with medication. States that she missed 1-2 doses of insulin a week. Control is suboptimal due to lack of adherence likely as a result of lack of motivation and problems obtaining medications.    Continued insulin NPH  (NOVOLIN) from 70 units twice daily before meals. Increased dose of insulin aspart (NOVOLOG) from 40 units twice daily before meals to 50 units twice daily before meals. Continued Trulicity (dulaglutide) 1.5mg  weekly.   A1C today 05/19/15: 11.5.    ASCVD risk greater than 7.5%. Restarted Aspirin 81 mg and Started pravastatin 80 mg.  Patient has peanut allergy which prevents use of atorvastatin due to soy allergy cross reactivity.   Hypertension longstanding.  Patient denies adherence with medication. Control is suboptimal due to lack of adherence and patient not understanding the importance of blood pressure control. Previously prescribed lisinopril 10mg  daily but patient reports not taking medication. Restart lisinopril 10mg  daily.  Written patient instructions provided.  Total time in face to face counseling 60 minutes.   Follow up in Pharmacist Clinic Visit in 2-3 weeks.   Patient seen with Zettie Cooley, PharmD Candidate, Early Chars, MD and Cruz Condon, PharmD Resident.

## 2015-05-19 NOTE — Assessment & Plan Note (Signed)
ASCVD risk greater than 7.5%. Restarted Aspirin 81 mg and Started pravastatin 80 mg.

## 2015-05-21 ENCOUNTER — Encounter: Payer: Self-pay | Admitting: Family Medicine

## 2015-05-21 NOTE — Patient Instructions (Signed)
Start taking aspirin 81mg  daily  Start taking lisinopril 10mg  daily, this has been reordered and you can pick it up from the health department  Start taking pravastatin 80mg  daily, this is also ready for pick-up  Continue taking Trulicity 1.5mg  weekly, this is also ready for pick-up  Increase Novolog dose from 40 units twice daily before meals to 50 units twice daily before meals Continue taking Novolin 70 units twice daily before meals, this is also ready for pick-up  Next follow-up appointment in 2-3 weeks with Dr. Valentina Lucks.

## 2015-05-21 NOTE — Assessment & Plan Note (Addendum)
Poorly controlled. Suspect there is a combination of requiring more medication, noncompliance (not taking insulin 1-2x/week), and dietary indiscretion.= - Continue insulin NPH  from 70 units twice daily before meals.  - Increase dose of Novolog from 40 units twice daily before meals to 50 units twice daily before meals.  - Continue Trulicity 1.5mg  weekly.  - foot exam completed today - discussed the importance of taking lisinopril for renal protection, pravastatin, and ASA.  - discussed importance of checking CBGs at least twice daily and keeping a log. Asked her to bring in glucometer. - could consider nutrition consult in the future, however at this time I would be happy just getting her in regularly to see myself or Dr. Valentina Lucks. - f/u with Dr. Valentina Lucks in approximately 2 weeks, and myself in 1 month - advised about changing insulin administration sites and discussed insulin lipodystrophy.

## 2015-05-21 NOTE — Assessment & Plan Note (Signed)
Long-standing. Suboptimal control due to poor compliance. Discussed that she may not feel symptoms of elevated BPs, however it can still cause damage to vital organs such as kidneys and heart.  -advised pt to restart lisinopril 10mg  - CMET today - pt to f/u in 2-3 weeks with Dr. Valentina Lucks (discussed that pt should try to alternate between seeing myself and Dr. Valentina Lucks).

## 2015-05-23 ENCOUNTER — Encounter: Payer: Self-pay | Admitting: Family Medicine

## 2015-06-09 ENCOUNTER — Encounter: Payer: Self-pay | Admitting: Pharmacist

## 2015-06-09 ENCOUNTER — Ambulatory Visit (INDEPENDENT_AMBULATORY_CARE_PROVIDER_SITE_OTHER): Payer: Self-pay | Admitting: Pharmacist

## 2015-06-09 VITALS — BP 117/83 | HR 75 | Wt 202.8 lb

## 2015-06-09 DIAGNOSIS — Z794 Long term (current) use of insulin: Secondary | ICD-10-CM

## 2015-06-09 DIAGNOSIS — E118 Type 2 diabetes mellitus with unspecified complications: Secondary | ICD-10-CM

## 2015-06-09 DIAGNOSIS — E1165 Type 2 diabetes mellitus with hyperglycemia: Secondary | ICD-10-CM

## 2015-06-09 DIAGNOSIS — I10 Essential (primary) hypertension: Secondary | ICD-10-CM

## 2015-06-09 DIAGNOSIS — IMO0002 Reserved for concepts with insufficient information to code with codable children: Secondary | ICD-10-CM

## 2015-06-09 NOTE — Progress Notes (Signed)
Patient ID: Margaret Larsen, female   DOB: 1970/03/29, 45 y.o.   MRN: [id number] Reviewed: agree with Dr. Graylin Shiver documentation and management.

## 2015-06-09 NOTE — Progress Notes (Signed)
S:    Patient arrives in good spirits.  Presents for diabetes follow-up. Patient was last seen by Dr. Lorenso Courier on 05/19/15.   Patient reports Diabetes was diagnosed in 2010.   Patient reports some adherence with medications. She reports taking her NPH once daily when she eats and Novolog 1-2x per week only. She notes that she does take her Trulicity every Monday.   Current diabetes medications include: Trulicity 1.5 mg weekly, Novolog 50-55 units BID AC, In[place]ulin NPH 75 units twice daily before meals Current hypertension medications include: lisinopril (reports headaches with lisinopril)  Patient reports hypoglycemic events when she gets below BG of 200. Reports symptoms of dizziness, sweating, and feeling jittery. CBG ranges 299-410 with one value of 106 in the past 3 weeks.   Patient reported dietary habits: Eats 1-2 meals/day   O:  Lab Results  Component Value Date   HGBA1C 11.5 05/19/2015   Filed Vitals:   06/09/15 0905  BP: 117/83  Pulse: 75    A/P: Uncontrolled diabetes longstanding. Patient reports hypoglycemic events and is able to verbalize appropriate hypoglycemia management plan. Patient denies adherence with medication. Control is suboptimal due to lack of adherence likely due to lack of motivation and relative hypoglycemia symptoms when BG <200.  Change Novolog to 40 units once daily with the largest meal. Change NPH to 60 units twice daily. Continued Trulicity (dulaglutide) 1.5 mg weekly.   Next A1C anticipated August 2017.    ASCVD risk greater than 7.5%. Continued Aspirin 81 mg and Continued pravastatin 80 mg.   Hypertension longstanding. BP controlled today. Patient reports adherence with medication. Continue lisinopril daily. Reassess headache and adherence with lisinopril at next visit.   Written patient instructions provided.  Total time in face to face counseling 30 minutes.   Follow up in Pharmacist Clinic Visit in 3 weeks.   Patient seen with Mikael Spray, PharmD Candidate and Joya San, PharmD Resident.

## 2015-06-09 NOTE — Patient Instructions (Signed)
Thanks for coming in today!  Continue taking lisinopril.  Change Novolog to 40 units once daily with your biggest meal.  Change NPH to 60 units twice daily.  Return to Pharmacy Clinic in 3 weeks.

## 2015-06-09 NOTE — Assessment & Plan Note (Signed)
Uncontrolled diabetes longstanding. Patient reports hypoglycemic events and is able to verbalize appropriate hypoglycemia management plan. Patient denies adherence with medication. Control is suboptimal due to lack of adherence likely due to lack of motivation and relative hypoglycemia symptoms when BG <200.  Change Novolog to 40 units once daily with the largest meal. Change NPH to 60 units twice daily. Continued Trulicity (dulaglutide) 1.5 mg weekly.   Next A1C anticipated August 2017.

## 2015-06-09 NOTE — Assessment & Plan Note (Signed)
Hypertension longstanding. BP controlled today. Patient reports adherence with medication. Continue lisinopril daily. Reassess headache and adherence with lisinopril at next visit.

## 2015-06-19 ENCOUNTER — Ambulatory Visit (INDEPENDENT_AMBULATORY_CARE_PROVIDER_SITE_OTHER): Payer: Self-pay | Admitting: Family Medicine

## 2015-06-19 VITALS — BP 132/86 | HR 90 | Temp 98.7°F | Wt 205.8 lb

## 2015-06-19 DIAGNOSIS — M791 Myalgia: Secondary | ICD-10-CM

## 2015-06-19 DIAGNOSIS — IMO0001 Reserved for inherently not codable concepts without codable children: Secondary | ICD-10-CM | POA: Insufficient documentation

## 2015-06-19 DIAGNOSIS — Z794 Long term (current) use of insulin: Secondary | ICD-10-CM

## 2015-06-19 DIAGNOSIS — E1165 Type 2 diabetes mellitus with hyperglycemia: Secondary | ICD-10-CM

## 2015-06-19 DIAGNOSIS — E118 Type 2 diabetes mellitus with unspecified complications: Secondary | ICD-10-CM

## 2015-06-19 DIAGNOSIS — I10 Essential (primary) hypertension: Secondary | ICD-10-CM

## 2015-06-19 DIAGNOSIS — IMO0002 Reserved for concepts with insufficient information to code with codable children: Secondary | ICD-10-CM

## 2015-06-19 DIAGNOSIS — M609 Myositis, unspecified: Secondary | ICD-10-CM

## 2015-06-19 HISTORY — DX: Reserved for inherently not codable concepts without codable children: IMO0001

## 2015-06-19 LAB — GLUCOSE, CAPILLARY: GLUCOSE-CAPILLARY: 107 mg/dL — AB (ref 65–99)

## 2015-06-19 LAB — CK: CK TOTAL: 61 U/L (ref 7–177)

## 2015-06-19 NOTE — Progress Notes (Signed)
Subjective: CC: f/u DM  HPI: Patient is a 45 y.o. female with a past medical history of T2DM, HTN, neuropathy, and OSA presenting to clinic today for a f/u on DM.   Myalgias: progressively getting worse over the last 1.5 months.  She notes it is all over, most notably in her arms and legs. Denies joint swelling, erythema, or pain. No fevers or chills.  She's been taking Latuda and is positive it is the culprit. She's denies that it is Crestor, she states she didn't pick it up until 5/22 so it can't be that. She wants to get off the [place]. She has an appt with [facility] at 4pm today.   Diabetes:  CBGs at home: fasting 80-117; random- 117 - 224 Prescribed medications: On Trulicity 1.5mg  weekly, insulin NPH from 60 units twice daily before meals,  Novolog 40 units daily with largest meal.  She's doing 70 units of the NPH BID. She hasn't forgotten any doses per her report.  Side effects: she feels funny when her blood sugars are lower- she feels dizzy, nausea, sick on her stomach. She feels woozy today with a CBG of 107. Notes her symptoms occur when she's in the 80-100s.  ROS: denies fever, chills, dizziness, LOC, polyuria, polydipsia, numbness or tingling in extremities or chest pain. Last foot exam:  05/19/15 Last A1c: 11.5 05/19/15 Nephropathy screen indicated?: on lisinopril Only eats 1 meal per day.    Social History: never smoker  Health Maintenance: up to date  ROS: All other systems reviewed and are negative besides that noted in HPI.  Past Medical History Patient Active Problem List   Diagnosis Date Noted  . Myalgia and myositis 06/19/2015  . Allergic rhinitis 04/25/2015  . Skin lesion of scalp 01/12/2015  . Hyperlipidemia 11/03/2014  . 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 Hand pain 09/21/2014  . Atypical squamous cells of undetermined significance (ASCUS) on Papanicolaou smear of cervix 03/22/2013  . At risk for healthcare associated infection 03/15/2013  . GERD  (gastroesophageal reflux disease) 11/13/2012  . Glaucoma suspect of both eyes 06/25/2012  . OSA (obstructive sleep apnea) 10/18/2010  . Diabetic peripheral neuropathy ([place]) 01/23/2010  . PERSONAL HISTORY OF ALLERGY TO PEANUTS 11/02/2009  . PTSD 07/31/2009  . Uncontrolled type 2 diabetes mellitus with complication ([place]) 123456  . HYPERTENSION, BENIGN ESSENTIAL 11/12/2007  . DYSLIPIDEMIA 10/02/2007  . BIPOLAR DISORDER UNSPECIFIED 08/11/2007  . OBESITY, NOS 03/06/2006    Medications- reviewed and updated  Objective: Office vital signs reviewed. BP 132/86 mmHg  Pulse 90  Temp(Src) 98.7 F (37.1 C) (Oral)  Wt 205 lb 12.8 oz (93.35 kg)   Physical Examination:  General: Awake, alert, well- nourished, NAD Cardio: RRR, no m/r/g noted.  Pulm: No increased WOB.  CTAB, without wheezes, rhonchi or crackles noted.  MSK: Normal to inspection. No effusions, warmth, erythema noted at the joints. No tenderness to palpation. 5/5 strength in the UE and LE bilaterally. Skin: No rash noted.  Fasting CBG: 107  Assessment/Plan: HYPERTENSION, BENIGN ESSENTIAL BP at goal today.  - continue lisinopril  Uncontrolled type 2 diabetes mellitus with complication Patient endorsing significant hypoglycemic symptoms with CBS in the 100s. 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 Patient is actually taking more NPH than prescribed from last visit. Additionally, she is only eating 1 meal, discussed eating 3 well balanced meals rather than 1 big meal. - will decrease Novolog from 40 units to 35 units as she notes this  is when her CBGs drop significantly and make her feel bad (CBG 224 >80s). - advised patient to only do 60 units of NPH twice with meals instead of the 70 units she was doing. - I would like to get her CBGs down, however would err on dropping them slower as to not cause her more symptoms and give her more reasons to become non-adherent to her medications.  -  patient to f/u with [facility] in 2 weeks.  Myalgia and myositis Patient endorsing significant myalgias over the last 1-1.5 months without any other associated symptoms. No fevers, chills, rash. No joint swelling noted on exam.     Orders Placed This Encounter  Procedures  . Glucose, capillary  . CK  . Glucose (CBG)    No orders of the defined types were placed in this encounter.    Archie Patten PGY-2, [place]

## 2015-06-19 NOTE — Assessment & Plan Note (Signed)
Patient endorsing significant hypoglycemic symptoms with CBS in the 100s. 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 Patient is actually taking more NPH than prescribed from last visit. Additionally, she is only eating 1 meal, discussed eating 3 well balanced meals rather than 1 big meal. - will decrease Novolog from 40 units to 35 units as she notes this is when her CBGs drop significantly and make her feel bad (CBG 224 >80s). - advised patient to only do 60 units of NPH twice with meals instead of the 70 units she was doing. - I would like to get her CBGs down, however would err on dropping them slower as to not cause her more symptoms and give her more reasons to become non-adherent to her medications.  - patient to f/u with [facility] in 2 weeks.

## 2015-06-19 NOTE — Assessment & Plan Note (Addendum)
Patient endorsing significant myalgias over the last 1-1.5 months without any other associated symptoms. No fevers, chills, rash. No joint swelling noted on exam. Given the temporality of her symptoms, I suspect this could be from her statin. Patient adamant that it is the [place]. - Will get CK today - pt to discuss with [facility].

## 2015-06-19 NOTE — Assessment & Plan Note (Signed)
BP at goal today.  - continue lisinopril

## 2015-06-19 NOTE — Patient Instructions (Signed)
Decrease Novolog from 40 to 35 units daily with largest meal.  Drop the NPH insulin to 60 units (from 70 units) twice a day with meals.  I'm going to check you CK level today. I will call you if it is not normal.

## 2015-06-21 ENCOUNTER — Encounter: Payer: Self-pay | Admitting: Family Medicine

## 2015-06-29 ENCOUNTER — Ambulatory Visit: Payer: Self-pay

## 2015-06-30 ENCOUNTER — Ambulatory Visit (INDEPENDENT_AMBULATORY_CARE_PROVIDER_SITE_OTHER): Payer: Self-pay | Admitting: Pharmacist

## 2015-06-30 ENCOUNTER — Encounter: Payer: Self-pay | Admitting: Pharmacist

## 2015-06-30 VITALS — BP 118/82 | HR 80 | Wt 208.4 lb

## 2015-06-30 DIAGNOSIS — E1165 Type 2 diabetes mellitus with hyperglycemia: Secondary | ICD-10-CM

## 2015-06-30 DIAGNOSIS — IMO0002 Reserved for concepts with insufficient information to code with codable children: Secondary | ICD-10-CM

## 2015-06-30 DIAGNOSIS — Z794 Long term (current) use of insulin: Secondary | ICD-10-CM

## 2015-06-30 DIAGNOSIS — E118 Type 2 diabetes mellitus with unspecified complications: Secondary | ICD-10-CM

## 2015-06-30 NOTE — Assessment & Plan Note (Signed)
Diabetes longstanding currently uncontrolled. Patient reports hypoglycemic events and is able to verbalize appropriate hypoglycemia management plan. Patient reports much improved adherence with medication. Control is suboptimal due to poor diet. Continue taking insulin NPH 70 units twice daily. Continue dulaglutide 1.5 mg weekly. Decrease insulin aspart to 30 units daily with the biggest meal. Emphasized to patient that she should call the office if her blood sugars drop below the 90s. Counseled on nutrition and positive changes that could be made to her diet such as drinking more water and healthier snacking options. Patient self-selected a weight loss goal of < 200 lbs by the next visit about 1 month and seems motivated to lose weight.

## 2015-06-30 NOTE — Patient Instructions (Signed)
Thank you for coming in today!  Decrease your Novolog to 30 units once daily with your biggest meal.  Continue taking Novolin 70 units twice daily.  Continue injecting Trulicity 1.5 mg once every week on Mondays.  Continue measuring your blood sugars every day!  Follow up with Dr. Valentina Lucks in early August.

## 2015-06-30 NOTE — Progress Notes (Signed)
Patient ID: Margaret Larsen, female   DOB: Jan 30, 1970, 44 y.o.   MRN: [id number] Reviewed: Agree with Dr. Graylin Shiver documentation and management.

## 2015-06-30 NOTE — Progress Notes (Signed)
    S:    Patient arrives in good spirits, ambulating without assistance.  Presents for diabetes follow-up.  Patient was last seen by Primary Care Provider on 06/19/2015.   Patient reports Diabetes was diagnosed in 2010.   Patient reports adherence with medications. She reports only missing one dose of insulin in the past week. Current diabetes medications include: Dulaglutide 1.5 mg weekly, Insulin aspart 40 units daily with biggest meal, insulin NPH 70 units BID. Current hypertension medications include: lisinopril  Patient reports hypoglycemic events. She reports that these happen almost every day, especially after she takes her insulin aspart with her biggest meal of the day.  Patient reported dietary habits: Eats 1-2 meals/day. Reports drinking juice. Snacks on crackers with peanut butter and cheese.   Patient denies nocturia. She reports this has improved since taking her insulin more regularly.  O:  Lab Results  Component Value Date   HGBA1C 11.5 05/19/2015   Filed Vitals:   06/30/15 0953  BP: 118/82  Pulse: 80    BG meter readings range from 97 to 333, with most readings being in the mid 100s to high 200s.   10 year ASCVD risk: 6.4%.  A/P: Diabetes longstanding currently uncontrolled. Patient reports hypoglycemic events and is able to verbalize appropriate hypoglycemia management plan. Patient reports much improved adherence with medication. Control is suboptimal due to poor diet. Continue taking insulin NPH 70 units twice daily. Continue dulaglutide 1.5 mg weekly. Decrease insulin aspart to 30 units daily with the biggest meal. Emphasized to patient that she should call the office if her blood sugars drop below the 90s. Counseled on nutrition and positive changes that could be made to her diet such as drinking more water and healthier snacking options. Patient self-selected a weight loss goal of < 200 lbs by the next visit about 1 month and seems motivated to lose  weight.  Next A1C anticipated August 2017.    ASCVD risk is 6.4%. Continued Aspirin 81 mg and Continued pravastatin 80 mg.   Hypertension longstanding currently controlled.  Patient reports adherence with medication.   Written patient instructions provided.  Total time in face to face counseling 30 minutes.   Follow up in Pharmacist Clinic Visit in early August.   Patient seen with Mikael Spray, PharmD Candidate and Joya San, PharmD Resident.

## 2015-08-15 ENCOUNTER — Ambulatory Visit: Payer: Self-pay | Admitting: Student

## 2015-10-17 ENCOUNTER — Other Ambulatory Visit: Payer: Self-pay | Admitting: Family Medicine

## 2015-10-17 NOTE — Telephone Encounter (Signed)
Needs refill on diflucan.  Her blood sugars have been out to control and that causes yeast infections.  She is trying to get her orange card renewed but has to wait for 2 weeks for an appt at [facility].  [facility]  Please advise

## 2015-10-19 MED ORDER — FLUCONAZOLE 150 MG PO TABS: 150.0000 mg | ORAL_TABLET | Freq: Once | ORAL | 0 refills | Status: AC

## 2015-10-19 NOTE — Telephone Encounter (Signed)
Diflucan refilled. Attempted to call patient twice now without answer.   I cannot advise her on the insulin without know what her blood sugars are. Please touch base and see if we can figure out what her blood sugars are throughout the day so I can determine how to change her regimen.  Archie Patten, MD [facility] Family Medicine Resident  10/19/2015, 5:27 PM

## 2015-10-23 NOTE — Telephone Encounter (Signed)
Spoke with patient and informed her that diflucan had been refilled. Gave patient message from MD and she states she hasnt been able to check her blood sugars because she it out of test strips and doesn't have money to get more since her orange card expired. She states the last time she checked her blood sugar was the end of last week and it was >400. Will forward to PCP.

## 2015-10-26 NOTE — Telephone Encounter (Signed)
Please have the pt come in to see either a provider or Dr. Valentina Lucks within the next day or 2 if possible so that we can follow up on her blood sugars and make sure they're not getting dangerously high.  Thank you, Kathrine Cords

## 2015-10-27 NOTE — Telephone Encounter (Signed)
Spoke with patient and advised her of message from MD, patient states only day she can come in is next Thursday. Appointment scheduled with Dr. Valentina Lucks for 10/26 at 10am,. FYI to PCP

## 2015-11-02 ENCOUNTER — Ambulatory Visit (INDEPENDENT_AMBULATORY_CARE_PROVIDER_SITE_OTHER): Payer: Self-pay | Admitting: Pharmacist

## 2015-11-02 ENCOUNTER — Ambulatory Visit: Payer: Self-pay | Admitting: Pharmacist

## 2015-11-02 ENCOUNTER — Encounter: Payer: Self-pay | Admitting: Pharmacist

## 2015-11-02 DIAGNOSIS — IMO0002 Reserved for concepts with insufficient information to code with codable children: Secondary | ICD-10-CM

## 2015-11-02 DIAGNOSIS — Z91010 Allergy to peanuts: Secondary | ICD-10-CM

## 2015-11-02 DIAGNOSIS — Z794 Long term (current) use of insulin: Secondary | ICD-10-CM

## 2015-11-02 DIAGNOSIS — E1165 Type 2 diabetes mellitus with hyperglycemia: Secondary | ICD-10-CM

## 2015-11-02 DIAGNOSIS — E118 Type 2 diabetes mellitus with unspecified complications: Secondary | ICD-10-CM

## 2015-11-02 LAB — POCT GLYCOSYLATED HEMOGLOBIN (HGB A1C): HEMOGLOBIN A1C: 10.3

## 2015-11-02 MED ORDER — INSULIN NPH (HUMAN) (ISOPHANE) 100 UNIT/ML ~~LOC~~ SUSP: 75.0000 [IU] | Freq: Two times a day (BID) | SUBCUTANEOUS | 11 refills | Status: DC

## 2015-11-02 NOTE — Assessment & Plan Note (Signed)
Diabetes longstanding currently remains uncontrolled with A1c 10.3% most likely due to poor mealtime insulin adherence and poor diet. Patient denies hypoglycemic events and is able to verbalize appropriate hypoglycemia management plan. Appears adherent with NPH and Trulicity and apears non-adherent with Novolog. Increased dose of basal insulin Novolin (insulin NPH) to 75 units bid. Continued and encouraged rapid insulin Novolog (insulin aspart) at 30 units daily with largest meal.  Patient verbalized understanding and willing to use Novolog with 1 large meal daily. Continued Trulicity (dulaglutide) at 1.5 mg weekly on Monday. Next A1C anticipated in 3 months.

## 2015-11-02 NOTE — Progress Notes (Signed)
Patient ID: Margaret Larsen, female   DOB: 02-Feb-1970, 45 y.o.   MRN: [id number] Reviewed: Agree with Dr. Graylin Shiver documentation and management.

## 2015-11-02 NOTE — Progress Notes (Signed)
    S:    Patient arrives in good spirits without assistance.  Presents for diabetes evaluation, education, and management at the request of Dr. Lorenso Courier. Patient was last seen by Primary Care Provider on 612/17.   Patient reports Diabetes was diagnosed in 2010.   Patient reports adherence with medications. She reports missing Novolog doses. She also reports not taking pravastatin. Current diabetes medications include: NPH 70 units bid, Novolog 30 units with meals (aherence to Novolog is questionable). Trulicity (dulaglutide) 1.5 mg weekly on Monday Current hypertension medications include: lisinopril 10 mg daily  Patient denies hypoglycemic events.  Patient reported dietary habits: Eats 1 meal/day Breakfast: small snack usually Dinner: carrots, rice Drinks: water (reports drinking more)  Patient reported exercise habits: walking extensively at work   Patient reports nocturia. ~1 time per night. Patient reports neuropathy. Numbness with walking and standing for long periods of time. Has to stop walking after ~5 min because of pain. Patient reports visual changes. Dimness, "in & out", blurriness  Patient reports self foot exams.   O:  Lab Results  Component Value Date   HGBA1C 11.5 05/19/2015   Vitals:   11/02/15 1424  BP: 105/72  Pulse: 82    Home fasting CBG: last checked ~300s ** Patient ran out of strips and orange card expired. Has not checked BG in 2 weeks. **  10 year ASCVD risk: 6.4%.  A/P: Diabetes longstanding currently remains uncontrolled with A1c 10.3% most likely due to poor mealtime insulin adherence and poor diet. Patient denies hypoglycemic events and is able to verbalize appropriate hypoglycemia management plan. Appears adherent with NPH and Trulicity and apears non-adherent with Novolog. Increased dose of basal insulin Novolin (insulin NPH) to 75 units bid. Continued and encouraged rapid insulin Novolog (insulin aspart) at 30 units daily with largest meal.   Patient verbalized understanding and willing to use Novolog with 1 large meal daily. Continued Trulicity (dulaglutide) at 1.5 mg weekly on Monday. Next A1C anticipated in 3 months.   Hypertension longstanding currently controlled.  Patient reports adherence with medication.   Hyperlipidemia - nonadherent with pravastatin.      Written patient instructions provided.  Total time in face to face counseling 45 minutes.  Follow up in Pharmacist Clinic Visit in 1 month.   Patient seen with Dr. Valentina Lucks, Belia Heman (PharmD, PGY1 pharmacy resident), and Shonna Chock [facility] pharmacy student)

## 2015-11-02 NOTE — Patient Instructions (Signed)
It was nice to see you today!  Based on your A1c of 10.3%, we will make these changes: - Increase Humalin NPH to 75 units twice daily - Start taking Novolog 30 units every day with the biggest meal of the day  Continue to check your blood sugar 2-3 times during work days and 3-4 times during off days. Please bring your meter with you to your next appointment.   Continue to work towards your goals of continuing to walk and exercise and to lose weight (10 lbs in the next 3 months - you can do it!)  Call the clinic if you wake up with low blood sugar symptoms in the middle of the night. Schedule a follow-up appointment with Dr. Valentina Lucks in 1 month.

## 2015-11-02 NOTE — Assessment & Plan Note (Signed)
History of allergy.   At this time does NOT have an Epi - Pen.  Availability for [facility] provided Epi - Pen Unknown.   Inquired of availability through [facility] - left phone message.   Readdress at next visit.

## 2015-11-14 ENCOUNTER — Ambulatory Visit: Payer: Self-pay | Attending: Internal Medicine

## 2015-12-07 ENCOUNTER — Ambulatory Visit: Payer: Self-pay | Admitting: Pharmacist

## 2015-12-13 ENCOUNTER — Ambulatory Visit (INDEPENDENT_AMBULATORY_CARE_PROVIDER_SITE_OTHER): Payer: Self-pay | Admitting: Family Medicine

## 2015-12-13 ENCOUNTER — Encounter: Payer: Self-pay | Admitting: Family Medicine

## 2015-12-13 VITALS — BP 126/82 | HR 76 | Temp 97.8°F | Wt 202.0 lb

## 2015-12-13 DIAGNOSIS — IMO0002 Reserved for concepts with insufficient information to code with codable children: Secondary | ICD-10-CM

## 2015-12-13 DIAGNOSIS — Z794 Long term (current) use of insulin: Secondary | ICD-10-CM

## 2015-12-13 DIAGNOSIS — E118 Type 2 diabetes mellitus with unspecified complications: Secondary | ICD-10-CM

## 2015-12-13 DIAGNOSIS — E1165 Type 2 diabetes mellitus with hyperglycemia: Secondary | ICD-10-CM

## 2015-12-13 DIAGNOSIS — L659 Nonscarring hair loss, unspecified: Secondary | ICD-10-CM | POA: Insufficient documentation

## 2015-12-13 DIAGNOSIS — I10 Essential (primary) hypertension: Secondary | ICD-10-CM

## 2015-12-13 LAB — IRON AND TIBC
%SAT: 21 % (ref 11–50)
Iron: 66 ug/dL (ref 40–190)
TIBC: 309 ug/dL (ref 250–450)
UIBC: 243 ug/dL (ref 125–400)

## 2015-12-13 LAB — FERRITIN: FERRITIN: 136 ng/mL (ref 10–232)

## 2015-12-13 LAB — GLUCOSE, POCT (MANUAL RESULT ENTRY): POC GLUCOSE: 184 mg/dL — AB (ref 70–99)

## 2015-12-13 LAB — TSH: TSH: 3.15 mIU/L

## 2015-12-13 NOTE — Patient Instructions (Signed)
Continue taking 70 units of the Humulin twice daily with meals Take NovoLog 30 units with your biggest meal. Continue Trulicity.  Take your blood pressure medications daily. Follow-up with Dr. Valentina Lucks in one month.  For your hair loss, I am checking a thyroid function and your iron levels.

## 2015-12-13 NOTE — Assessment & Plan Note (Signed)
Blood sugar 184 today. Patient has drank coffee with sugar this morning. Additionally, patient notes she may have difficulty obtaining Trulicity in the future, but she's working on getting her coverage for this. -Discussed the importance of taking NPH 70 units twice daily on a regular basis in addition to the NovoLog 30 units within biggest meal daily. -Continue with Trulicity for now. -Encourage the patient to check her blood sugars regularly now that she has the orange card.

## 2015-12-13 NOTE — Progress Notes (Signed)
Subjective: CC: f/u DM  HPI: Patient is a 45 y.o. female with a past medical history of T2DM, HTN, neuropathy, and OSA presenting to clinic today for a f/u on DM.  Diabetes:  CBGs at home: She hasn't taken in over a month as she just got her orange card. Prescribed medications: On Trulicity 1.5mg  weekly, insulin NPH 70 units twice daily before meals,  Novolog 30 units daily with largest meal.  She states she is missing 1-2 doses of her insulin per week.  Side effects: she feels funny when her blood sugars are lower- she feels dizzy, nausea, sick on her stomach. She feels woozy today with a CBG of 107. Notes her symptoms occur when she's in the 80-100s.  ROS: denies fever, chills, dizziness, LOC, polyuria, polydipsia, numbness or tingling in extremities or chest pain. Last foot exam:  05/19/15 Last A1c: 11.5 05/19/15 Nephropathy screen indicated?: on lisinopril Only eats when she's hungry, typically 1-2 meals per day.   Hair loss: The patient states that for the last couple of months, she is noted her hair is falling out on top of her head. She denies any change in products or scalp. She notes her nails are also more brittle. She denies any heat or cold. Denies palpitations. She states her skin is dry.  Hypertension Blood pressure at home: doesn't check Meds: The patient states that she will take her blood pressure medications when she feels her blood pressure is elevated, for instance when she has a headache. She states her average she takes her medications today. Times per week. Side effects: None ROS: Denies dizziness, visual changes, nausea, vomiting, chest pain, abdominal pain or shortness of breath.   Social History: never smoker  Health Maintenance: up to date  ROS: All other systems reviewed and are negative besides that noted in HPI.  Past Medical History Patient Active Problem List   Diagnosis Date Noted  . Myalgia and myositis 06/19/2015  . Allergic rhinitis  04/25/2015  . Skin lesion of scalp 01/12/2015  . Hyperlipidemia 11/03/2014  . 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 Hand pain 09/21/2014  . Atypical squamous cells of undetermined significance (ASCUS) on Papanicolaou smear of cervix 03/22/2013  . At risk for healthcare associated infection 03/15/2013  . GERD (gastroesophageal reflux disease) 11/13/2012  . Glaucoma suspect of both eyes 06/25/2012  . OSA (obstructive sleep apnea) 10/18/2010  . Diabetic peripheral neuropathy ([place]) 01/23/2010  . PERSONAL HISTORY OF ALLERGY TO PEANUTS 11/02/2009  . PTSD 07/31/2009  . Uncontrolled type 2 diabetes mellitus with complication ([place]) 123456  . HYPERTENSION, BENIGN ESSENTIAL 11/12/2007  . DYSLIPIDEMIA 10/02/2007  . BIPOLAR DISORDER UNSPECIFIED 08/11/2007  . OBESITY, NOS 03/06/2006    Medications- reviewed and updated  Objective: Office vital signs reviewed. BP 126/82   Pulse 76   Temp 97.8 F (36.6 C) (Oral)   Wt 202 lb (91.6 kg)   BMI 40.80 kg/m    Physical Examination:  General: Awake, alert, well- nourished, NAD HEENT: significant hair loss most notable over the crown of her scalp. No rashes/erythemat noted.  Cardio: RRR, no m/r/g noted.  Pulm: No increased WOB.  CTAB, without wheezes, rhonchi or crackles noted.   Fasting CBG: 184  Assessment/Plan: No problem-specific Assessment & Plan notes found for this encounter.   Orders Placed This Encounter  Procedures  . TSH  . Iron and TIBC  . Ferritin  . Glucose (CBG)    No orders of the defined types were placed in this encounter.  Archie Patten PGY-2, [place]

## 2015-12-13 NOTE — Assessment & Plan Note (Signed)
The patient is presenting with hair loss over the last few months. She denies any change in hair products. She denies any irritation of her scalp.  Will check a TSH, iron, and ferritin today.

## 2015-12-13 NOTE — Assessment & Plan Note (Signed)
At goal despite not taking her medications in the last 2 days. We discussed the importance of taking her medications on a regular basis for the renal protection.

## 2016-02-06 ENCOUNTER — Ambulatory Visit (INDEPENDENT_AMBULATORY_CARE_PROVIDER_SITE_OTHER): Payer: Self-pay | Admitting: Family Medicine

## 2016-02-06 ENCOUNTER — Encounter: Payer: Self-pay | Admitting: Family Medicine

## 2016-02-06 VITALS — BP 132/78 | HR 83 | Temp 97.9°F | Wt 202.4 lb

## 2016-02-06 DIAGNOSIS — F410 Panic disorder [episodic paroxysmal anxiety] without agoraphobia: Secondary | ICD-10-CM | POA: Insufficient documentation

## 2016-02-06 DIAGNOSIS — F319 Bipolar disorder, unspecified: Secondary | ICD-10-CM

## 2016-02-06 NOTE — Progress Notes (Signed)
Dr. Lajuana Ripple requested a [place].   Presenting Issue:  Panic   Report of symptoms:  Patient reports feeling unpleasant physical symptoms out of the blue starting last Saturday. She was taking a break at work and started sweating, felt like her heart was "beating out of her chest," felt like things were closing in, and thought she was going to die. Patient concerned about why this happened. She indicated a number of other stressors in her life, including interpersonal problems and family problems. She feels that perhaps her stress level being high could have contributed to her symptoms.   Duration of CURRENT symptoms:  Patient denies having felt panic symptoms before. This started last Saturday and her symptoms have come and gone since then, but improved overall.   Psychiatric History - Diagnoses: Bipolar disorder, per patient and PMH - Hospitalizations: not assessed - Pharmacotherapy: Mirtazapine, managed by [facility] - Outpatient therapy: none, per patient  Assessment / Plan / Recommendations: Patient reported that her medications are currently managed by [facility] and that they have recommended that she attend group therapy. Patient is uncomfortable with the idea of group therapy, but is open to asking for an individual therapist at [facility]. Given her history and that she has already established care at [facility], I think it would be best for her to see a therapist there, if possible, rather than following up with a [facility] here. I did some deep breathing teaching and practice with patient today, and she reported feeling good after doing this. I encouraged her to practice this relaxation and gave her a list of apps that can help guide her in relaxing. I asked patient if she would like me to call in a couple of weeks to check in and she agreed. I'll follow up by phone.

## 2016-02-06 NOTE — Progress Notes (Signed)
Subjective: CC: ?panic attacks OIZ:[id number] R Margaret Larsen is a 46 y.o. female presenting to clinic today for same day appointment. PCP: Kathrine Cords, MD Concerns today include:  1. ?Panic attacks Patient seen last in 2012 by Dr Gwenlyn Saran for mental health related issues.  She has a PMH of Bipolar disorder and PTSD.  In 2012, she was on Depakote.  She was referred by her PCP in 2014 to follow up with her mental health provider.  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  She reports sweating, SOB, felt suffocated.  She notes that her chest felt tight.  She reports that she returned to work and her co-worker noticed she looked off.  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  She reports that she had symptoms on Sunday and part of yesterday.  She reports that yesterday and Sunday, she only had the chest tightness which was unassociated with activity.  She denies nausea, vomiting, diaphoresis, SOB, CP.  Patient notes that she has been told that she has h/o Bipolar disorder/ Schizophrenia.  She currently sees [facility] for this.  Her next appt with [facility] is 02/27/2016.  She is not sure what medications that she is on but states is only on one medication.  She reports compliance with her medication each night.  Allergies  Allergen Reactions  . Metformin And Related Hives and Diarrhea    Unable to tolerate  . Peanut-Containing Drug Products Shortness Of Breath and Swelling    mouth swelling  . Codeine Other (See Comments)    Patient stated that it slowed her heart rate, shortness of breath  . Diphenhydramine Hcl Hives and Rash  . Fexofenadine-Pseudoephed Er Other (See Comments)    Patient stated that is slowed her heart rate  . Latuda [Lurasidone Hcl] Other (See Comments)    Myalgias all over, most notably in her arms and legs.  .  Shellfish Allergy Itching   Social Hx reviewed: non smoker. MedHx, current medications and allergies reviewed.  Please see EMR. ROS: Per HPI  Objective: Office vital signs reviewed. BP 132/78   Pulse 83   Temp 97.9 F (36.6 C) (Oral)   Wt 202 lb 6.4 oz (91.8 kg)   SpO2 98%   BMI 40.88 kg/m   Physical Examination:  General: Awake, alert, obese, No acute distress HEENT: Normal, MMM Cardio: regular rate Pulm: normal WOB on room air Psych: mood stable, poor eye contact, flat affect, speech normal  GAD 7 : Generalized Anxiety Score 02/06/2016  Nervous, Anxious, on Edge 3  Control/stop worrying 3  Worry too much - different things 3  Restless 2  Easily annoyed or irritable 3  Afraid - awful might happen 3  Anxiety Difficulty Very difficult   Assessment/ Plan: 46 y.o. female   1. Panic attack.  GAD 7 score elevated.  Mood questionnaire positive.  Patient is on Mirtazapine 7.60m qhs per [facility].  She met with [facility], today.  Please see her separate note.  No evidence of SI/HI.  I recommend that she follow up with [facility] closely for medication adjustment/ counseling.  I will defer medication adjustment to her provider.  2. Bipolar affective disorder, remission status unspecified ([place] Continue current medications/ management per [facility]  Follow up with PCP prn.  AJanora Norlander DO PGY-3, [facility]

## 2016-02-06 NOTE — Patient Instructions (Signed)
I recommend that you schedule an appointment with your Dr at [facility] as soon as possible.  Because they are prescribing your medications, I will refrain from making changes today.  You met with Sonia Baller, one of our behavioral health specialists today.  Hopefully, you have some helpful tools for anxiety/ panic today.  Please feel free to reach out to [place] any time.  Panic Attacks Panic attacks are sudden, short-livedsurges of severe anxiety, fear, or discomfort. They may occur for no reason when you are relaxed, when you are anxious, or when you are sleeping. Panic attacks may occur for a number of reasons:  Healthy people occasionally have panic attacks in extreme, life-threatening situations, such as war or natural disasters. Normal anxiety is a protective mechanism of the body that helps [place] react to danger (fight or flight response).  Panic attacks are often seen with anxiety disorders, such as panic disorder, social anxiety disorder, generalized anxiety disorder, and phobias. Anxiety disorders cause excessive or uncontrollable anxiety. They may interfere with your relationships or other life activities.  Panic attacks are sometimes seen with other mental illnesses, such as depression and posttraumatic stress disorder.  Certain medical conditions, prescription medicines, and drugs of abuse can cause panic attacks. What are the signs or symptoms? Panic attacks start suddenly, peak within 20 minutes, and are accompanied by four or more of the following symptoms:  Pounding heart or fast heart rate (palpitations).  Sweating.  Trembling or shaking.  Shortness of breath or feeling smothered.  Feeling choked.  Chest pain or discomfort.  Nausea or strange feeling in your stomach.  Dizziness, light-headedness, or feeling like you will faint.  Chills or hot flushes.  Numbness or tingling in your lips or hands and feet.  Feeling that things are not real or feeling that you are not  yourself.  Fear of losing control or going crazy.  Fear of dying. Some of these symptoms can mimic serious medical conditions. For example, you may think you are having a heart attack. Although panic attacks can be very scary, they are not life threatening. How is this diagnosed? Panic attacks are diagnosed through an assessment by your health care provider. Your health care provider will ask questions about your symptoms, such as where and when they occurred. Your health care provider will also ask about your medical history and use of alcohol and drugs, including prescription medicines. Your health care provider may order blood tests or other studies to rule out a serious medical condition. Your health care provider may refer you to a mental health professional for further evaluation. How is this treated?  Most healthy people who have one or two panic attacks in an extreme, life-threatening situation will not require treatment.  The treatment for panic attacks associated with anxiety disorders or other mental illness typically involves counseling with a mental health professional, medicine, or a combination of both. Your health care provider will help determine what treatment is best for you.  Panic attacks due to physical illness usually go away with treatment of the illness. If prescription medicine is causing panic attacks, talk with your health care provider about stopping the medicine, decreasing the dose, or substituting another medicine.  Panic attacks due to alcohol or drug abuse go away with abstinence. Some adults need professional help in order to stop drinking or using drugs. Follow these instructions at home:  Take all medicines as directed by your health care provider.  Schedule and attend follow-up visits as directed by your health  care provider. It is important to keep all your appointments. Contact a health care provider if:  You are not able to take your medicines as  prescribed.  Your symptoms do not improve or get worse. Get help right away if:  You experience panic attack symptoms that are different than your usual symptoms.  You have serious thoughts about hurting yourself or others.  You are taking medicine for panic attacks and have a serious side effect. This information is not intended to replace advice given to you by your health care provider. Make sure you discuss any questions you have with your health care provider. Document Released: 12/24/2004 Document Revised: 06/01/2015 Document Reviewed: 08/07/2012 Elsevier Interactive Patient Education  2017 [employer].

## 2016-02-07 ENCOUNTER — Telehealth: Payer: Self-pay | Admitting: Family Medicine

## 2016-02-07 ENCOUNTER — Other Ambulatory Visit: Payer: Self-pay | Admitting: Family Medicine

## 2016-02-07 NOTE — Telephone Encounter (Signed)
Pt needs a referral to the dentist to have a tooth pulled. ep

## 2016-02-08 ENCOUNTER — Other Ambulatory Visit: Payer: Self-pay | Admitting: Internal Medicine

## 2016-02-08 DIAGNOSIS — K0889 Other specified disorders of teeth and supporting structures: Secondary | ICD-10-CM

## 2016-02-08 MED ORDER — INSULIN ASPART 100 UNIT/ML ~~LOC~~ SOLN: 40.0000 [IU] | Freq: Every day | SUBCUTANEOUS | 0 refills | Status: DC

## 2016-02-08 NOTE — Telephone Encounter (Signed)
Please let Ms. Greth know that I have sent in a referral to dentistry and I have refilled her Novolog. Thanks!

## 2016-02-09 NOTE — Telephone Encounter (Signed)
Pt informed and told that she would have to give them time to call her about the referral. Delray Alt, CMA

## 2016-04-11 ENCOUNTER — Telehealth: Payer: Self-pay | Admitting: Family Medicine

## 2016-04-11 NOTE — Telephone Encounter (Signed)
Needs refill on clarinex-allergy medicine.  Health dept- [facility].

## 2016-04-12 MED ORDER — DESLORATADINE 5 MG PO TABS: 5.0000 mg | ORAL_TABLET | Freq: Every day | ORAL | 6 refills | Status: DC

## 2016-04-19 ENCOUNTER — Encounter: Payer: Self-pay | Admitting: Family Medicine

## 2016-04-19 ENCOUNTER — Ambulatory Visit (INDEPENDENT_AMBULATORY_CARE_PROVIDER_SITE_OTHER): Payer: Self-pay | Admitting: Family Medicine

## 2016-04-19 VITALS — BP 110/60 | HR 102 | Temp 98.5°F | Ht 59.0 in | Wt 205.0 lb

## 2016-04-19 DIAGNOSIS — L0231 Cutaneous abscess of buttock: Secondary | ICD-10-CM | POA: Insufficient documentation

## 2016-04-19 MED ORDER — IBUPROFEN 400 MG PO TABS: 400.0000 mg | ORAL_TABLET | Freq: Three times a day (TID) | ORAL | 0 refills | Status: DC | PRN

## 2016-04-19 MED ORDER — CEPHALEXIN 500 MG PO CAPS: 500.0000 mg | ORAL_CAPSULE | Freq: Four times a day (QID) | ORAL | 0 refills | Status: DC

## 2016-04-19 NOTE — Patient Instructions (Addendum)
It was nice seeing you today. I am sorry about your boil. At this point, we do not need to drain it. Let us try antibiotic and pain medicine. Call if this does not get better.  Skin Abscess A skin abscess is an infected area on or under your skin that contains pus and other material. An abscess can happen almost anywhere on your body. Some abscesses break open (rupture) on their own. Most continue to get worse unless they are treated. The infection can spread deeper into the body and into your blood, which can make you feel sick. Treatment usually involves draining the abscess. Follow these instructions at home: Abscess Care   If you have an abscess that has not drained, place a warm, clean, wet washcloth over the abscess several times a day. Do this as told by your doctor.  Follow instructions from your doctor about how to take care of your abscess. Make sure you:  Cover the abscess with a bandage (dressing).  Change your bandage or gauze as told by your doctor.  Wash your hands with soap and water before you change the bandage or gauze. If you cannot use soap and water, use hand sanitizer.  Check your abscess every day for signs that the infection is getting worse. Check for:  More redness, swelling, or pain.  More fluid or blood.  Warmth.  More pus or a bad smell. Medicines    Take over-the-counter and prescription medicines only as told by your doctor.  If you were prescribed an antibiotic medicine, take it as told by your doctor. Do not stop taking the antibiotic even if you start to feel better. General instructions   To avoid spreading the infection:  Do not share personal care items, towels, or hot tubs with others.  Avoid making skin-to-skin contact with other people.  Keep all follow-up visits as told by your doctor. This is important. Contact a doctor if:  You have more redness, swelling, or pain around your abscess.  You have more fluid or blood coming from  your abscess.  Your abscess feels warm when you touch it.  You have more pus or a bad smell coming from your abscess.  You have a fever.  Your muscles ache.  You have chills.  You feel sick. Get help right away if:  You have very bad (severe) pain.  You see red streaks on your skin spreading away from the abscess. This information is not intended to replace advice given to you by your health care provider. Make sure you discuss any questions you have with your health care provider. Document Released: 06/12/2007 Document Revised: 08/20/2015 Document Reviewed: 11/02/2014 Elsevier Interactive Patient Education  2017 [employer].

## 2016-04-19 NOTE — Progress Notes (Signed)
History was provided by the patient.  Margaret Larsen is a 46 y.o. female who is here for "boil on bottom".     HPI:   Abscess: Patient presents with a 2 day history of localized pain on her left gluteal cheek. She rates the pain 10/10 and has tried Advil and a sitz bath with no pain relief. Sitting and laying on the left side are the known aggravating factors. No discharge. Patient denies any recent illness but notes feeling hot and cold last night. No fever, nausea, vomiting or immediate concern for systemic infection.   Physical Exam:  BP 110/60   Pulse (!) 102   Temp 98.5 F (36.9 C) (Oral)   Ht 4\' 11"  (1.499 m)   Wt 205 lb (93 kg)   SpO2 98%   BMI 41.40 kg/m       General:   mild distress, uncomfortable appearing  Abscess Firm, tender, mildly swollen hyperpigmented abscess of left gluteal cleft, No drainage or discharge   Lungs:  clear to auscultation bilaterally  Heart:   regular rate and rhythm, S1, S2 normal, no murmur, click, rub or gallop     Assessment/Plan:  See problem list  Luretha Rued, Medical Student  04/19/16

## 2016-04-19 NOTE — Progress Notes (Deleted)
Subjective:     Patient ID: Margaret Larsen, female   DOB: 1970/08/27, 46 y.o.   MRN: [id number]  HPI   Review of Systems     Objective:   Physical Exam     Assessment:     ***    Plan:     ***

## 2016-04-19 NOTE — Assessment & Plan Note (Addendum)
Firm nonerythemetous and no discharge. I & D not recommended at this point.  Keflex 500 mg 4 times daily

## 2016-05-09 NOTE — Telephone Encounter (Signed)
Pt states [facility] does not have clarinex and would like to get a nose spray medication for allergies. ep

## 2016-05-13 ENCOUNTER — Encounter: Payer: Self-pay | Admitting: Family Medicine

## 2016-05-13 ENCOUNTER — Ambulatory Visit (INDEPENDENT_AMBULATORY_CARE_PROVIDER_SITE_OTHER): Payer: Self-pay | Admitting: Family Medicine

## 2016-05-13 VITALS — BP 130/92 | HR 94 | Temp 98.1°F | Wt 201.0 lb

## 2016-05-13 DIAGNOSIS — E118 Type 2 diabetes mellitus with unspecified complications: Secondary | ICD-10-CM

## 2016-05-13 DIAGNOSIS — R059 Cough, unspecified: Secondary | ICD-10-CM | POA: Insufficient documentation

## 2016-05-13 DIAGNOSIS — R05 Cough: Secondary | ICD-10-CM

## 2016-05-13 DIAGNOSIS — R053 Chronic cough: Secondary | ICD-10-CM | POA: Insufficient documentation

## 2016-05-13 DIAGNOSIS — E1165 Type 2 diabetes mellitus with hyperglycemia: Secondary | ICD-10-CM

## 2016-05-13 DIAGNOSIS — IMO0002 Reserved for concepts with insufficient information to code with codable children: Secondary | ICD-10-CM

## 2016-05-13 DIAGNOSIS — J302 Other seasonal allergic rhinitis: Secondary | ICD-10-CM

## 2016-05-13 DIAGNOSIS — Z794 Long term (current) use of insulin: Secondary | ICD-10-CM

## 2016-05-13 HISTORY — DX: Cough, unspecified: R05.9

## 2016-05-13 LAB — POCT GLYCOSYLATED HEMOGLOBIN (HGB A1C): HEMOGLOBIN A1C: 10.8

## 2016-05-13 MED ORDER — MOMETASONE FUROATE 50 MCG/ACT NA SUSP: 2.0000 | Freq: Every day | NASAL | 5 refills | Status: DC

## 2016-05-13 MED ORDER — BENZONATATE 200 MG PO CAPS: 200.0000 mg | ORAL_CAPSULE | Freq: Two times a day (BID) | ORAL | 0 refills | Status: DC | PRN

## 2016-05-13 NOTE — Patient Instructions (Signed)
Cough - Respiratory Infection (suspicious for viral cause) Treatment - you should: - Push fluids the best you can with water and/or Gatorade. - Take over-the-counter ibuprofen and/or Tylenol as directed on the bottles for fever, pain, and/or inflammation. - For severe symptoms (fever, chills, body aches, and malaise) I would recommend:  - 650-1000mg  of Tylenol 3 times a day (max).   - 200-600mg  of Ibuprofen 4 times a day (close to max).   - Take with food and plenty of water. - I have given you a prescription for Tessalon Perles. This should help with your cough. Otherwise, over-the-counter cough drops or sprays may help. - A spoonful of honey before bed can help soothe any sore throat you may have. - You can pickup some pseudoephedrine (Sudafed) from your local pharmacy. This may help with most of your symptoms while you are at work. However, I would recommend not taking this before bedtime as it has a tendency to keep people awake.  Also, only take this when surely necessary as it can cause some "rebound effects" with worsening congestion and runny nose. - Over-the-counter nasal saline spray may also help with any nasal irritation/congestion you may have or develop. - Understands that a cough from a viral infection like this may take up to 6-8 weeks to fully resolve. Please be patient. - Come back and see [place] if you begin to develop worsening symptoms, high fever (> 100.65F), for severe shortness of breath.   You should be better in: 5-7 days Call us if you have severe shortness of breath, high fever or are not better in 2 weeks

## 2016-05-13 NOTE — Assessment & Plan Note (Signed)
Patient is here with signs and symptoms consistent with viral URI. No current evidence suggesting bacterial infection. Lung sounds showed some faint crackles on right side, but unclear if possible atelectasis. OP slightly erythematous without any exudate. - Discussed importance of adequate hydration. - Tylenol/ibuprofen as needed for fevers/discomfort. - Tessalon Perles for cough, otherwise over-the-counter antitussives for cough PRN. - Discussed expectations with possibility of persistent cough lasting up to 6-8 weeks. - Return precautions discussed.  Next: If symptoms persist or worsen would consider having low threshold for CXR and/or ABX therapy

## 2016-05-13 NOTE — Progress Notes (Signed)
   HPI  CC: UPPER RESPIRATORY INFECTION Started Wednesday: had some subjective fevers, appetite diminished. Eventually had 1 day of vomiting last week. Coughing, rhinorrhea.  Onset: 5 days ago Course: no better or worse than initially Better with: laying down Meds tried: DayQuil/NyQuil, Zyrtec (Recently took Keflex for an abscess) Sick contacts: None Nasal discharge (color,laterality): Yes, some yellowish color to it  Sinusitis Risk Factors Fever: subjective fever only (afebrile now)   Headache/face pain: yes, but only with cough  Double sickening: no  Tooth pain: no   Allergy Risk Factors: Sneezing: no  Itchy scratchy throat: no  Seasonal sx: yes   Flu Risk Factors Headache: yes, with cough  Muscle aches: "a little"  Severe fatigue: no    Red Flags  Stiff neck: no  Dyspnea: no  Rash: no  Swallowing difficulty: no   CC, SH/smoking status, and VS noted  Objective: BP (!) 130/92   Pulse 94   Temp 98.1 F (36.7 C) (Oral)   Wt 201 lb (91.2 kg)   SpO2 95%   BMI 40.60 kg/m  Gen: NAD, alert, cooperative, and well appearing. HEENT: MMM, EOMI, PERRLA, OP erythematous without evidence of exudates, TMs clear bilaterally, no LAD, neck full ROM. CV: RRR, no murmur Resp: faint crackles noted on right side, no wheezes, non-labored Ext: No edema, warm   Assessment and plan:  Cough Patient is here with signs and symptoms consistent with viral URI. No current evidence suggesting bacterial infection. Lung sounds showed some faint crackles on right side, but unclear if possible atelectasis. OP slightly erythematous without any exudate. - Discussed importance of adequate hydration. - Tylenol/ibuprofen as needed for fevers/discomfort. - Tessalon Perles for cough, otherwise over-the-counter antitussives for cough PRN. - Discussed expectations with possibility of persistent cough lasting up to 6-8 weeks. - Return precautions discussed.  Next: If symptoms persist or worsen  would consider having low threshold for CXR and/or ABX therapy   Orders Placed This Encounter  Procedures  . HgB A1c    Meds ordered this encounter  Medications  . mometasone (NASONEX) 50 MCG/ACT nasal spray    Sig: Place 2 sprays into the nose daily.    Dispense:  17 g    Refill:  5  . benzonatate (TESSALON) 200 MG capsule    Sig: Take 1 capsule (200 mg total) by mouth 2 (two) times daily as needed for cough.    Dispense:  30 capsule    Refill:  0     Elberta Leatherwood, MD,MS,  PGY3 05/13/2016 5:38 PM

## 2016-05-14 NOTE — Telephone Encounter (Signed)
Addressed at visit yesterday.  Archie Patten, MD [facility] Family Medicine Resident  05/14/2016, 11:20 AM

## 2016-05-17 ENCOUNTER — Telehealth: Payer: Self-pay | Admitting: Family Medicine

## 2016-05-17 NOTE — Telephone Encounter (Signed)
Pt's A1c very elevated at her same day visit. pls have her make an appt with me or Koval for diabetes management.  Thanks, Archie Patten, MD [facility] Family Medicine Resident  05/17/2016, 10:18 AM

## 2016-05-19 ENCOUNTER — Emergency Department (HOSPITAL_COMMUNITY)
Admission: EM | Admit: 2016-05-19 | Discharge: 2016-05-19 | Disposition: A | Discharge status: Home / Self Care | Payer: Self-pay | Attending: Emergency Medicine | Admitting: Emergency Medicine

## 2016-05-19 ENCOUNTER — Encounter (HOSPITAL_COMMUNITY): Payer: Self-pay | Admitting: Vascular Surgery

## 2016-05-19 ENCOUNTER — Emergency Department (HOSPITAL_COMMUNITY): Payer: Self-pay

## 2016-05-19 DIAGNOSIS — Z7982 Long term (current) use of aspirin: Secondary | ICD-10-CM | POA: Insufficient documentation

## 2016-05-19 DIAGNOSIS — Y929 Unspecified place or not applicable: Secondary | ICD-10-CM | POA: Insufficient documentation

## 2016-05-19 DIAGNOSIS — Y939 Activity, unspecified: Secondary | ICD-10-CM | POA: Insufficient documentation

## 2016-05-19 DIAGNOSIS — Z23 Encounter for immunization: Secondary | ICD-10-CM | POA: Insufficient documentation

## 2016-05-19 DIAGNOSIS — Z79899 Other long term (current) drug therapy: Secondary | ICD-10-CM | POA: Insufficient documentation

## 2016-05-19 DIAGNOSIS — S60052A Contusion of left little finger without damage to nail, initial encounter: Secondary | ICD-10-CM | POA: Insufficient documentation

## 2016-05-19 DIAGNOSIS — S6000XA Contusion of unspecified finger without damage to nail, initial encounter: Secondary | ICD-10-CM

## 2016-05-19 DIAGNOSIS — Y999 Unspecified external cause status: Secondary | ICD-10-CM | POA: Insufficient documentation

## 2016-05-19 DIAGNOSIS — Z794 Long term (current) use of insulin: Secondary | ICD-10-CM | POA: Insufficient documentation

## 2016-05-19 DIAGNOSIS — L03011 Cellulitis of right finger: Secondary | ICD-10-CM | POA: Insufficient documentation

## 2016-05-19 DIAGNOSIS — W01198A Fall on same level from slipping, tripping and stumbling with subsequent striking against other object, initial encounter: Secondary | ICD-10-CM | POA: Insufficient documentation

## 2016-05-19 DIAGNOSIS — E119 Type 2 diabetes mellitus without complications: Secondary | ICD-10-CM | POA: Insufficient documentation

## 2016-05-19 LAB — CBG MONITORING, ED: Glucose-Capillary: 195 mg/dL — ABNORMAL HIGH (ref 65–99)

## 2016-05-19 MED ORDER — LIDOCAINE HCL (PF) 1 % IJ SOLN
5.0000 mL | Freq: Once | INTRAMUSCULAR | Status: AC
  Administered 2016-05-19: 5 mL
  Filled 2016-05-19: qty 5

## 2016-05-19 MED ORDER — TETANUS-DIPHTH-ACELL PERTUSSIS 5-2.5-18.5 LF-MCG/0.5 IM SUSP
0.5000 mL | Freq: Once | INTRAMUSCULAR | Status: AC
  Administered 2016-05-19: 0.5 mL via INTRAMUSCULAR
  Filled 2016-05-19: qty 0.5

## 2016-05-19 MED ORDER — CEPHALEXIN 500 MG PO CAPS: 500.0000 mg | ORAL_CAPSULE | Freq: Four times a day (QID) | ORAL | 0 refills | Status: DC

## 2016-05-19 NOTE — Discharge Instructions (Signed)
Follow up with your doctor in 2 days or return here sooner for worsening symptoms.

## 2016-05-19 NOTE — ED Notes (Signed)
Pt verbalized understanding discharge instructions and denies any further needs or questions at this time. VS stable, ambulatory and steady gait.   

## 2016-05-19 NOTE — ED Triage Notes (Signed)
Pt reports to the ED for eval of left 5th finger injury several days ago. She states that she slipped on a rug and injured that finger she also states that today she noticed her right thumb was swollen as well. Denies any known injury to her finger, increased erythema, or increased warmth to the thumb. ROM of fingers intact.

## 2016-05-19 NOTE — ED Provider Notes (Signed)
[facility] DEPT Provider Note   CSN: [id number] Arrival date & time: 05/19/16  1552  By signing my name below, I, Norris Cross, attest that this documentation has been prepared under the direction and in the presence of [facility], NP-C. Electronically Signed: Norris Cross , ED Scribe. 05/19/16. 6:42 PM.    History   Chief Complaint Chief Complaint  Patient presents with  . Finger Injury    HPI Margaret Larsen is a 46 y.o. female  with hx of diabetes mellitus who presents to the Emergency Department complaining of R thumb and L little finger injury with onset x5 days. Pt denies known injury to the R thumb but states that she injured the L little finger during a fall. She reportedly can not lift the L little finger without it causing pain. Pt denies any modifying factors. She denies any other complaints. Of note, pt denies hx of hang nails, biting her fingers.   The history is provided by the patient. No language interpreter was used.  Hand Pain  This is a new problem. The current episode started more than 2 days ago. The problem occurs constantly. The problem has been gradually worsening. The symptoms are aggravated by bending (lifting ). Nothing relieves the symptoms. She has tried nothing for the symptoms. The treatment provided no relief.    Past Medical History:  Diagnosis Date  . Diabetes mellitus   . Hemorrhoids 07/10/2010  . Hypertension   . Kidney stones   . Kidney stones   . Renal disorder     Patient Active Problem List   Diagnosis Date Noted  . Cough 05/13/2016  . Abscess of gluteal region 04/19/2016  . Panic attack 02/06/2016  . Hair loss 12/13/2015  . Myalgia and myositis 06/19/2015  . Allergic rhinitis 04/25/2015  . Skin lesion of scalp 01/12/2015  . Hyperlipidemia 11/03/2014  . 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 Hand pain 09/21/2014  . Atypical squamous cells of undetermined significance (ASCUS) on Papanicolaou smear of cervix  03/22/2013  . At risk for healthcare associated infection 03/15/2013  . GERD (gastroesophageal reflux disease) 11/13/2012  . Glaucoma suspect of both eyes 06/25/2012  . OSA (obstructive sleep apnea) 10/18/2010  . Diabetic peripheral neuropathy ([place]) 01/23/2010  . PERSONAL HISTORY OF ALLERGY TO PEANUTS 11/02/2009  . PTSD 07/31/2009  . Uncontrolled type 2 diabetes mellitus with complication ([place]) 65/68/1275  . HYPERTENSION, BENIGN ESSENTIAL 11/12/2007  . DYSLIPIDEMIA 10/02/2007  . Bipolar disorder ([place]) 08/11/2007  . OBESITY, NOS 03/06/2006    Past Surgical History:  Procedure Laterality Date  . CESAREAN SECTION    . CHOLECYSTECTOMY    . fiborids removed    . TUBAL LIGATION      OB History    No data available       Home Medications    Prior to Admission medications   Medication Sig Start Date End Date Taking? Authorizing Provider  ACCU-CHEK FASTCLIX LANCETS MISC 1 each by Does not apply route 3 (three) times daily. ICD 9 250.92 06/29/13   Boykin Nearing, MD  albuterol (PROVENTIL HFA;VENTOLIN HFA) 108 (90 BASE) MCG/ACT inhaler Inhale 2 puffs into the lungs every 6 (six) hours as needed for wheezing. 07/29/14 07/29/15  Zenia Resides, MD  aspirin 81 MG EC tablet Take 81 mg by mouth daily. Reported on 05/19/2015    [provider]  benzonatate (TESSALON) 200 MG capsule Take 1 capsule (200 mg total) by mouth 2 (two) times daily as needed for cough. 05/13/16  McKeag, Marylynn Pearson, MD  Blood Glucose Monitoring Suppl (AGAMATRIX PRESTO PRO METER) DEVI 1 Device by Does not apply route once. 06/30/14   Zenia Resides, MD  cephALEXin (KEFLEX) 500 MG capsule Take 1 capsule (500 mg total) by mouth 4 (four) times daily. 05/19/16   Ashley Murrain, NP  Dulaglutide (TRULICITY) 1.5 FM/3.8GY SOPN Inject 1.5 mg into the skin every Monday. Inject 1.5mg  into skin once weekly    [provider]  EPINEPHrine (EPI-PEN) 0.3 mg/0.3 mL DEVI Inject 0.3 mLs (0.3 mg total) into the muscle as  needed. For allergic reaction Patient not taking: Reported on 11/02/2015 08/05/11   Katherina Mires, MD  glucose blood (AGAMATRIX PRESTO TEST) test strip Use as instructed 06/30/14   Zenia Resides, MD  ibuprofen (ADVIL,MOTRIN) 400 MG tablet Take 1 tablet (400 mg total) by mouth every 8 (eight) hours as needed for moderate pain. 04/19/16   Kinnie Feil, MD  insulin aspart (NOVOLOG) 100 UNIT/ML injection Inject 40 Units into the skin daily with lunch. 02/08/16   Mayo, Pete Pelt, MD  insulin NPH Human (NOVOLIN N) 100 UNIT/ML injection Inject 0.75 mLs (75 Units total) into the skin 2 (two) times daily. 11/02/15   Zenia Resides, MD  Insulin Syringe-Needle U-100 (INSULIN SYRINGE .3CC/31GX5/16") 31G X 5/16" 0.3 ML MISC Use 3 needles per day 06/28/13   Elayne Snare, MD  lisinopril (PRINIVIL,ZESTRIL) 10 MG tablet Take 1 tablet (10 mg total) by mouth daily. 10/03/14   Zenia Resides, MD  mirtazapine (REMERON) 15 MG tablet Take 7.5 mg by mouth at bedtime.    [provider]  mometasone (NASONEX) 50 MCG/ACT nasal spray Place 2 sprays into the nose daily. 05/13/16   McKeag, Marylynn Pearson, MD  pravastatin (PRAVACHOL) 80 MG tablet Take 1 tablet (80 mg total) by mouth daily. Patient not taking: Reported on 11/02/2015 05/19/15   Zenia Resides, MD    Family History Family History  Problem Relation Age of Onset  . Hypertension Mother   . Diabetes Mother   . Cancer Mother     Social History Social History  Substance Use Topics  . Smoking status: Never Smoker  . Smokeless tobacco: Never Used  . Alcohol use No     Allergies   Metformin and related; Peanut-containing drug products; Codeine; Diphenhydramine hcl; Fexofenadine-pseudoephed er; Anette Guarneri [lurasidone hcl]; and Shellfish allergy   Review of Systems Review of Systems  Constitutional: Negative for chills and fever.  Gastrointestinal: Negative for nausea and vomiting.  Musculoskeletal: Positive for arthralgias (L little finger, R thumb).   Skin: Positive for color change (redness to right thumb).     Physical Exam Updated Vital Signs BP 130/86 (BP Location: Right Arm)   Pulse 85   Temp 99.5 F (37.5 C) (Oral)   Resp 16   SpO2 99%   Physical Exam  Constitutional: She appears well-developed and well-nourished. No distress.  HENT:  Head: Normocephalic.  Eyes: EOM are normal.  Neck: Neck supple.  Cardiovascular: Normal rate.   Pulmonary/Chest: Effort normal.  Musculoskeletal: Normal range of motion.       Right hand: She exhibits tenderness (R thumb).       Left hand: She exhibits tenderness (L little finger).  Full flexion and extension of the fingers. Good strength, adequate circulation RP is 2+. Tenderness to the L little finger with palpation and ROM. Tenderness is to the ulnar aspect of the L little finger. R thumb with erythema surrounding the nail. There  is swelling and tenderness upon palpation. Normal capillary refill. Nor red streaking, no tenderness to the tip.   Neurological: She is alert.  Skin: Skin is warm and dry.  Psychiatric: She has a normal mood and affect.  Nursing note and vitals reviewed.    ED Treatments / Results   DIAGNOSTIC STUDIES: Oxygen Saturation is 98% on RA, normal by my interpretation.   COORDINATION OF CARE: 6:42 PM-Discussed next steps with pt. Pt verbalized understanding and is agreeable with the plan.    Labs (all labs ordered are listed, but only abnormal results are displayed) Labs Reviewed  CBG MONITORING, ED - Abnormal; Notable for the following:       Result Value   Glucose-Capillary 195 (*)    All other components within normal limits    Radiology Dg Finger Little Left  Result Date: 05/19/2016 CLINICAL DATA:  Fall, injury, swelling EXAM: LEFT LITTLE FINGER 2+V COMPARISON:  None. FINDINGS: No fracture or dislocation is seen. The joint spaces are preserved. Visualized soft tissues are within normal limits. IMPRESSION: Negative. Electronically Signed   By:  Julian Hy M.D.   On: 05/19/2016 17:10   Dg Finger Thumb Right  Result Date: 05/19/2016 CLINICAL DATA:  Fall, injury, swelling EXAM: RIGHT THUMB 2+V COMPARISON:  None. FINDINGS: No fracture or dislocation is seen. The joint spaces are preserved. Visualized soft tissues are within normal limits. IMPRESSION: Negative. Electronically Signed   By: Julian Hy M.D.   On: 05/19/2016 17:09    Procedures .Marland KitchenIncision and Drainage Date/Time: 05/19/2016 6:39 PM Performed by: Ashley Murrain Authorized by: Ashley Murrain   Consent:    Consent obtained:  Verbal   Consent given by:  Patient   Risks discussed:  Incomplete drainage   Alternatives discussed:  No treatment Location:    Indications for incision and drainage: paronychia.   Location:  Upper extremity   Upper extremity location:  Finger   Finger location:  R thumb Pre-procedure details:    Skin preparation:  Betadine Anesthesia (see MAR for exact dosages):    Anesthesia method:  Local infiltration   Local anesthetic:  Lidocaine 1% w/o epi Procedure type:    Complexity:  Complex Procedure details:    Needle aspiration: no     Incision types:  Single straight   Incision depth:  Dermal   Scalpel blade:  11   Wound management:  Probed and deloculated   Drainage:  Purulent   Drainage amount: small.   Wound treatment:  Wound left open Post-procedure details:    Patient tolerance of procedure:  Tolerated well, no immediate complications   (including critical care time)  Medications Ordered in ED Medications  Tdap (BOOSTRIX) injection 0.5 mL (0.5 mLs Intramuscular Given 05/19/16 1747)  lidocaine (PF) (XYLOCAINE) 1 % injection 5 mL (5 mLs Infiltration Given 05/19/16 1748)     Initial Impression / Assessment and Plan / ED Course  I have reviewed the triage vital signs and the nursing notes.  Pertinent labs & imaging results that were available during my care of the patient were reviewed by me and considered in my medical  decision making (see chart for details).   Final Clinical Impressions(s) / ED Diagnoses  46 y.o. female with pain and swelling of the right thumb and pain to the left little finger stable for d/c without fever or red streaking from the paronychia and without abnormal findings on x-ray of either finger. Will treat paronychia with keflex and warm soaks and will splint  left little finger for comfort. Patient to f/u with her PCP in 2 days or return here for worsening symptoms.  CBG 195  Final diagnoses:  Paronychia of finger of right hand  Contusion of finger of left hand, unspecified finger, initial encounter    New Prescriptions New Prescriptions   CEPHALEXIN (KEFLEX) 500 MG CAPSULE    Take 1 capsule (500 mg total) by mouth 4 (four) times daily.   I personally performed the services described in this documentation, which was scribed in my presence. The recorded information has been reviewed and is accurate.     Debroah Baller [place], [place] 05/19/16 1843    Virgel Manifold, MD 05/19/16 [phone]

## 2016-05-22 NOTE — Telephone Encounter (Signed)
Pt orange card expired and will make and appt after she renews it. Deseree Kennon Holter, CMA

## 2016-05-30 ENCOUNTER — Telehealth: Payer: Self-pay | Admitting: Family Medicine

## 2016-05-30 ENCOUNTER — Ambulatory Visit: Payer: Self-pay

## 2016-05-30 DIAGNOSIS — K0889 Other specified disorders of teeth and supporting structures: Secondary | ICD-10-CM

## 2016-05-30 NOTE — Telephone Encounter (Signed)
Pt needs a referral to see the dentist. She has the orange card and has some serious issues with her teeth. jw

## 2016-06-05 NOTE — Telephone Encounter (Signed)
Referral placed non-urgently. There is often a very long waiting list, unless she has an infection/abscess. If the patient is worried about an abscess, please have her come in to be evaluated to see if we can get her into the dentist sooner.   Thanks, Archie Patten, MD [facility] Family Medicine Resident

## 2016-06-06 ENCOUNTER — Other Ambulatory Visit: Payer: Self-pay | Admitting: *Deleted

## 2016-06-06 DIAGNOSIS — Z794 Long term (current) use of insulin: Principal | ICD-10-CM

## 2016-06-06 DIAGNOSIS — E118 Type 2 diabetes mellitus with unspecified complications: Principal | ICD-10-CM

## 2016-06-06 DIAGNOSIS — IMO0002 Reserved for concepts with insufficient information to code with codable children: Secondary | ICD-10-CM

## 2016-06-06 DIAGNOSIS — E1165 Type 2 diabetes mellitus with hyperglycemia: Secondary | ICD-10-CM

## 2016-06-06 MED ORDER — DULAGLUTIDE 1.5 MG/0.5ML ~~LOC~~ SOAJ: 1.5000 mg | SUBCUTANEOUS | 0 refills | Status: DC

## 2016-06-06 MED ORDER — INSULIN NPH (HUMAN) (ISOPHANE) 100 UNIT/ML ~~LOC~~ SUSP: 75.0000 [IU] | Freq: Two times a day (BID) | SUBCUTANEOUS | 0 refills | Status: DC

## 2016-06-06 NOTE — Telephone Encounter (Signed)
I refilled the patient's diabetes medication. Please have her f/u with either me or Dr. Valentina Lucks within the next month for her diabetes.  Thanks, Archie Patten, MD [facility] Family Medicine Resident  06/06/2016, 5:42 PM

## 2016-06-10 ENCOUNTER — Other Ambulatory Visit: Payer: Self-pay | Admitting: *Deleted

## 2016-06-10 MED ORDER — INSULIN NPH (HUMAN) (ISOPHANE) 100 UNIT/ML ~~LOC~~ SUSP: 75.0000 [IU] | Freq: Two times a day (BID) | SUBCUTANEOUS | 0 refills | Status: DC

## 2016-06-10 NOTE — Addendum Note (Signed)
Addended by: Derl Barrow on: 06/10/2016 04:36 PM   Modules accepted: Orders

## 2016-06-11 ENCOUNTER — Telehealth: Payer: Self-pay | Admitting: *Deleted

## 2016-06-11 MED ORDER — INSULIN ASPART 100 UNIT/ML ~~LOC~~ SOLN: 40.0000 [IU] | Freq: Every day | SUBCUTANEOUS | 0 refills | Status: DC

## 2016-06-11 NOTE — Telephone Encounter (Signed)
Please give [facility] a call regarding patient's Rx for Novolin-N quantity, which is a 6 day supply.  Also has questions regarding increase dosage of Novolog. Please call [phone].  Derl Barrow, RN

## 2016-06-11 NOTE — Telephone Encounter (Signed)
Called and no way to talk to someone. Left a voice mail.  Changed Novolin Rx to dispense 52mL for a 30 day supply.  Patient was noted to have Novolog dose of 40 units [place] daily back in April so I'm not sure what the change is they are referring to? She should be on 40 units with lunch and 55 units with lung if CBG >200 as previously prescribed.   Patient has not been seen by me since 12/17. Asked pt previously to make an appt as her A1c was very elevated at a same day appt 5/7, no appt made unfortunately.   Archie Patten, MD [facility] Family Medicine Resident  06/11/2016, 12:03 PM

## 2016-06-11 NOTE — Telephone Encounter (Signed)
Spoke with patient, she is waiting on orange card and will schedule when she receives approval. Thank you

## 2016-06-16 ENCOUNTER — Other Ambulatory Visit: Payer: Self-pay | Admitting: Family Medicine

## 2016-06-20 MED ORDER — IBUPROFEN 400 MG PO TABS: 400.0000 mg | ORAL_TABLET | Freq: Three times a day (TID) | ORAL | 0 refills | Status: DC | PRN

## 2016-06-20 NOTE — Telephone Encounter (Signed)
Patient called stating she felt dizzy and lightheaded. Her CBG was 325 when checked today. She reports compliance with her diabetes regimen. She has felt this way since yesterday. No chest pain or trouble breathing. She has not eaten much today as she has "not been feeling like it". No nausea, emesis or diarrhea. She states she has been drinking adequately. No fevers. Patient advised to maintain adequate hydration and eat regularly. Follow with clinic in the AM. ED precautions reviewed

## 2016-06-21 ENCOUNTER — Ambulatory Visit (INDEPENDENT_AMBULATORY_CARE_PROVIDER_SITE_OTHER): Payer: Self-pay | Admitting: Family Medicine

## 2016-06-21 DIAGNOSIS — R42 Dizziness and giddiness: Secondary | ICD-10-CM | POA: Insufficient documentation

## 2016-06-21 MED ORDER — MECLIZINE HCL 32 MG PO TABS: 32.0000 mg | ORAL_TABLET | Freq: Three times a day (TID) | ORAL | 0 refills | Status: DC | PRN

## 2016-06-21 NOTE — Patient Instructions (Signed)

## 2016-06-21 NOTE — Progress Notes (Signed)
Subjective:   Margaret Larsen is a 46 y.o. female with a history of PTSD, bipolar disorder, obesity, HTN, OSA, GERD, T2 DM with neuropathy, HLD here for same day appointment for  Chief Complaint  Patient presents with  . Dizziness     DIZZINESS  Feeling dizzy for 3 days. Feels like room spins: yes, especially this morning - off balance when walking in the store yesterday - but better today Lightheadedness when stands: anytime, intermittent, first 2 days were lightheaded, now more room spinning Palpitations or heart racing: no Prior dizziness: yes with panic attack but this is different than that Medications tried: no Taking blood thinners: no  Symptoms Hearing Loss: no Ear Pain or fullness: no Nausea or vomiting: nausea, no vomiting Diarrhea any time that she eats (this is more of a chronic issue) - decreased PO intake, but is drinking water Vision difficulty or double vision: no, but "foggy" Falls: no Head trauma: no Weakness in arm or leg: no, but does have R arm soreness that is improving Speaking problems: no Headache: no No CP, SOB  Review of Systems:  Per HPI.   Social History: never smoker  Objective:  Temp 97.7 F (36.5 C) (Oral)   Wt 202 lb (91.6 kg)   SpO2 99%   BMI 40.80 kg/m   Gen:  46 y.o. female in NAD HEENT: NCAT, MMM, EOMI, PERRL, anicteric sclerae, OP clear, TMs clear b/l CV: RRR, no MRG Resp: Non-labored, CTAB, no wheezes noted Abd: Soft, NTND, BS present, no guarding or organomegaly Ext: WWP, no edema MSK: R shoulder: no deformities, no TTP, full ROM Neuro: Alert and oriented, speech normal Psych: emotionally labile with intermittent anger, tearfulness, appropriate groom and dress, no tangential speech       Chemistry      Component Value Date/Time   NA 133 (L) 05/19/2015 0933   K 4.0 05/19/2015 0933   CL 98 05/19/2015 0933   CO2 27 05/19/2015 0933   BUN 9 05/19/2015 0933   CREATININE 0.54 05/19/2015 0933      Component Value  Date/Time   CALCIUM 8.5 (L) 05/19/2015 0933   ALKPHOS 104 05/19/2015 0933   AST 12 05/19/2015 0933   ALT 8 05/19/2015 0933   BILITOT 0.3 05/19/2015 0933      Lab Results  Component Value Date   WBC 7.1 04/10/2012   HGB 13.6 04/10/2012   HCT 40.1 04/10/2012   MCV 83.2 04/10/2012   PLT 367 04/10/2012   Lab Results  Component Value Date   TSH 3.15 12/13/2015   Lab Results  Component Value Date   HGBA1C 10.8 05/13/2016   Assessment & Plan:     Margaret Larsen is a 46 y.o. female here for   Vertigo Patient is a poor historian and has a difficult time qualifying her dizziness She does state that the room is spinning She sometimes says this is constant and sometimes it is "on and off" Does not seem positional, so doubt BPPV Neuro intake and no CP/palpitations, so doubt neuro/cardiac etiology Can trial meclizine Check CBC and CMP F/u with PCP next week to see if improvement  Of note, patient became angry and yelled at provider during visit when asked clarifying questions. When asked to remain calm, she started crying and called a friend.  At end of visit, after discussion of A&P, patient refuses AVS, Rx, and labs.  Virginia Crews, MD MPH PGY-3,  [facility] 06/21/2016  11:07 AM

## 2016-06-21 NOTE — Assessment & Plan Note (Signed)
Patient is a poor historian and has a difficult time qualifying her dizziness She does state that the room is spinning She sometimes says this is constant and sometimes it is "on and off" Does not seem positional, so doubt BPPV Neuro intake and no CP/palpitations, so doubt neuro/cardiac etiology Can trial meclizine Check CBC and CMP F/u with PCP next week to see if improvement

## 2016-07-29 ENCOUNTER — Other Ambulatory Visit: Payer: Self-pay | Admitting: *Deleted

## 2016-07-29 DIAGNOSIS — E1165 Type 2 diabetes mellitus with hyperglycemia: Secondary | ICD-10-CM

## 2016-07-29 DIAGNOSIS — E118 Type 2 diabetes mellitus with unspecified complications: Principal | ICD-10-CM

## 2016-07-29 DIAGNOSIS — Z794 Long term (current) use of insulin: Principal | ICD-10-CM

## 2016-07-29 DIAGNOSIS — IMO0002 Reserved for concepts with insufficient information to code with codable children: Secondary | ICD-10-CM

## 2016-07-29 MED ORDER — DULAGLUTIDE 1.5 MG/0.5ML ~~LOC~~ SOAJ: 1.5000 mg | SUBCUTANEOUS | 0 refills | Status: DC

## 2016-07-29 MED ORDER — INSULIN NPH (HUMAN) (ISOPHANE) 100 UNIT/ML ~~LOC~~ SUSP: 75.0000 [IU] | Freq: Two times a day (BID) | SUBCUTANEOUS | 0 refills | Status: DC

## 2016-07-30 ENCOUNTER — Other Ambulatory Visit: Payer: Self-pay | Admitting: *Deleted

## 2016-07-30 DIAGNOSIS — E118 Type 2 diabetes mellitus with unspecified complications: Principal | ICD-10-CM

## 2016-07-30 DIAGNOSIS — E1165 Type 2 diabetes mellitus with hyperglycemia: Secondary | ICD-10-CM

## 2016-07-30 DIAGNOSIS — Z794 Long term (current) use of insulin: Principal | ICD-10-CM

## 2016-07-30 DIAGNOSIS — IMO0002 Reserved for concepts with insufficient information to code with codable children: Secondary | ICD-10-CM

## 2016-07-30 MED ORDER — INSULIN NPH (HUMAN) (ISOPHANE) 100 UNIT/ML ~~LOC~~ SUSP: 75.0000 [IU] | Freq: Two times a day (BID) | SUBCUTANEOUS | 0 refills | Status: DC

## 2016-07-30 NOTE — Progress Notes (Unsigned)
Received fax from [facility] stating one 10 ml vial of Novolin- N will only last 6 days.  Rx resent for 30 day supply 50 ml=33 day supply.  Derl Barrow, RN

## 2016-08-09 ENCOUNTER — Telehealth: Payer: Self-pay | Admitting: *Deleted

## 2016-08-09 DIAGNOSIS — IMO0002 Reserved for concepts with insufficient information to code with codable children: Secondary | ICD-10-CM

## 2016-08-09 DIAGNOSIS — Z794 Long term (current) use of insulin: Principal | ICD-10-CM

## 2016-08-09 DIAGNOSIS — E118 Type 2 diabetes mellitus with unspecified complications: Principal | ICD-10-CM

## 2016-08-09 DIAGNOSIS — E1165 Type 2 diabetes mellitus with hyperglycemia: Secondary | ICD-10-CM

## 2016-08-09 MED ORDER — INSULIN NPH (HUMAN) (ISOPHANE) 100 UNIT/ML ~~LOC~~ SUSP: 75.0000 [IU] | Freq: Two times a day (BID) | SUBCUTANEOUS | 0 refills | Status: DC

## 2016-08-14 ENCOUNTER — Encounter: Payer: Self-pay | Admitting: Internal Medicine

## 2016-08-14 ENCOUNTER — Other Ambulatory Visit: Payer: Self-pay | Admitting: Internal Medicine

## 2016-08-14 ENCOUNTER — Ambulatory Visit (INDEPENDENT_AMBULATORY_CARE_PROVIDER_SITE_OTHER): Payer: Self-pay | Admitting: Internal Medicine

## 2016-08-14 VITALS — BP 118/74 | HR 89 | Temp 98.3°F | Wt 205.2 lb

## 2016-08-14 DIAGNOSIS — R109 Unspecified abdominal pain: Secondary | ICD-10-CM

## 2016-08-14 MED ORDER — OMEPRAZOLE 20 MG PO CPDR: 20.0000 mg | DELAYED_RELEASE_CAPSULE | Freq: Every day | ORAL | 1 refills | Status: DC

## 2016-08-14 NOTE — Progress Notes (Signed)
   Subjective:    Margaret Larsen - 46 y.o. female MRN [id number]  Date of birth: March 18, 1970  HPI  Margaret Larsen is here for abdominal pain. Has been occurring for several months. Abdominal pain across both upper quadrants. Can't eat because she has nausea, heartburn and abdominal pain within 30 minutes after eating. Very infrequent vomiting. Sensation of burning in throat and food getting stuck. She also feels like she has to use the bathroom urgently frequently after eating with both urination and with defecation. Occasional diarrhea. No changes in medications in dose or addition of new medications. No antibiotics within the past 90 days. No travel or time outdoors. No sick contacts. No fevers at home. No melena or hematochezia.   -  reports that she has never smoked. She has never used smokeless tobacco. - Review of Systems: Per HPI. - Past Medical History: Patient Active Problem List   Diagnosis Date Noted  . Vertigo 06/21/2016  . Cough 05/13/2016  . Abscess of gluteal region 04/19/2016  . Panic attack 02/06/2016  . Hair loss 12/13/2015  . Myalgia and myositis 06/19/2015  . Allergic rhinitis 04/25/2015  . Skin lesion of scalp 01/12/2015  . Hyperlipidemia 11/03/2014  . 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 Hand pain 09/21/2014  . Atypical squamous cells of undetermined significance (ASCUS) on Papanicolaou smear of cervix 03/22/2013  . At risk for healthcare associated infection 03/15/2013  . GERD (gastroesophageal reflux disease) 11/13/2012  . Glaucoma suspect of both eyes 06/25/2012  . OSA (obstructive sleep apnea) 10/18/2010  . Diabetic peripheral neuropathy ([place]) 01/23/2010  . PERSONAL HISTORY OF ALLERGY TO PEANUTS 11/02/2009  . PTSD 07/31/2009  . Uncontrolled type 2 diabetes mellitus with complication ([place]) 58/09/9831  . HYPERTENSION, BENIGN ESSENTIAL 11/12/2007  . DYSLIPIDEMIA 10/02/2007  . Bipolar disorder ([place]) 08/11/2007  . OBESITY, NOS 03/06/2006   -  Medications: reviewed and updated   Objective:   Physical Exam BP 118/74   Pulse 89   Temp 98.3 F (36.8 C) (Oral)   Wt 205 lb 3.2 oz (93.1 kg)   SpO2 98%   BMI 41.45 kg/m  Gen: NAD, alert, cooperative with exam, well-appearing Abd: SNTND, BS present, no guarding or organomegaly     Assessment & Plan:   1. Abdominal pain, unspecified abdominal location Unknown etiology. Would like to rule out infectious or parasitic process causing chronic abdominal pain and diarrhea. While obtaining these studies will also check for blood in stool. If blood present, would warrant GI work up. IBS would be a consideration given chronic abdominal pain, nausea, and diarrhea. Considered diabetic gastroparesis given uncontrolled T2DM (A1c >10) however would expect more vomiting and less diarrhea per history. Uncontrolled GERD may also be contributing to symptoms therefore will prescribe trial of PPI. Will not test for Cdiff as patient denies antibiotic use within the past 90 days and no known exposure. Abdominal exam is benign and issue has been chronic so patient appears stable for continued outpatient management.  -GI pathogen panel  - Occult blood card to lab, stool - Ova and parasite examination - omeprazole (PRILOSEC) 20 MG capsule; Take 1 capsule (20 mg total) by mouth daily.  Dispense: 30 capsule; Refill: 1 -diary of when symptoms occur and what was eaten most recently  -f/u with PCP in 2-4 weeks for further management and evaluation   Phill Myron, D.O. 08/14/2016, 12:01 PM PGY-3, [place]

## 2016-08-14 NOTE — Patient Instructions (Signed)
We are unsure what is causing your abdominal pain and other symptoms so we need to obtain some stool studies to check for infections and parasites. I have also prescribed a anti-acid medicine to help with heartburn. Please keep a diary of what you are eating and when the symptoms occur. Please follow up with your PCP (Dr. Emmaline Life) in the next 2-4 weeks to discuss this further.

## 2016-08-20 ENCOUNTER — Other Ambulatory Visit: Payer: Self-pay | Admitting: *Deleted

## 2016-08-20 MED ORDER — DULAGLUTIDE 1.5 MG/0.5ML ~~LOC~~ SOAJ: 1.5000 mg | SUBCUTANEOUS | 0 refills | Status: DC

## 2016-08-27 NOTE — Telephone Encounter (Signed)
Medication filled.  Markiyah Gahm L, RN  

## 2016-10-03 ENCOUNTER — Other Ambulatory Visit: Payer: Self-pay | Admitting: Family Medicine

## 2016-10-03 DIAGNOSIS — IMO0002 Reserved for concepts with insufficient information to code with codable children: Secondary | ICD-10-CM

## 2016-10-03 DIAGNOSIS — E118 Type 2 diabetes mellitus with unspecified complications: Principal | ICD-10-CM

## 2016-10-03 DIAGNOSIS — E1165 Type 2 diabetes mellitus with hyperglycemia: Secondary | ICD-10-CM

## 2016-10-09 ENCOUNTER — Ambulatory Visit: Payer: Self-pay | Admitting: Internal Medicine

## 2016-11-12 ENCOUNTER — Other Ambulatory Visit: Payer: Self-pay | Admitting: Internal Medicine

## 2016-11-12 MED ORDER — INSULIN ASPART 100 UNIT/ML ~~LOC~~ SOLN: 40.0000 [IU] | Freq: Every day | SUBCUTANEOUS | 0 refills | Status: DC

## 2016-12-13 ENCOUNTER — Telehealth: Payer: Self-pay | Admitting: Internal Medicine

## 2016-12-13 NOTE — Telephone Encounter (Signed)
After-hours Patient calling for a refill on her Diflucan.  She states she picked up her last pill of Diflucan today.  She would like a refill.  Discussed with patient that unless she has been seen recently for a yeast infection, it is not appropriate or correct care to give her further refills.  I advised her to use the current Diflucan she has, wait 2-3 days.  If she still has a yeast infection at that point I suggested that she follows up at our clinic.

## 2016-12-25 ENCOUNTER — Other Ambulatory Visit: Payer: Self-pay | Admitting: Internal Medicine

## 2016-12-25 ENCOUNTER — Ambulatory Visit: Payer: Self-pay

## 2016-12-25 ENCOUNTER — Other Ambulatory Visit: Payer: Self-pay | Admitting: Family Medicine

## 2016-12-25 DIAGNOSIS — E118 Type 2 diabetes mellitus with unspecified complications: Principal | ICD-10-CM

## 2016-12-25 DIAGNOSIS — E1165 Type 2 diabetes mellitus with hyperglycemia: Secondary | ICD-10-CM

## 2016-12-25 DIAGNOSIS — IMO0002 Reserved for concepts with insufficient information to code with codable children: Secondary | ICD-10-CM

## 2017-01-20 ENCOUNTER — Other Ambulatory Visit: Payer: Self-pay | Admitting: Internal Medicine

## 2017-01-20 DIAGNOSIS — E118 Type 2 diabetes mellitus with unspecified complications: Principal | ICD-10-CM

## 2017-01-20 DIAGNOSIS — E1165 Type 2 diabetes mellitus with hyperglycemia: Secondary | ICD-10-CM

## 2017-01-20 DIAGNOSIS — IMO0002 Reserved for concepts with insufficient information to code with codable children: Secondary | ICD-10-CM

## 2017-01-20 MED ORDER — INSULIN ASPART 100 UNIT/ML ~~LOC~~ SOLN: 40.0000 [IU] | Freq: Every day | SUBCUTANEOUS | 0 refills | Status: DC

## 2017-01-20 NOTE — Telephone Encounter (Signed)
I have refilled Novolog. However patient's current regiment is very confusing. She has not been seen since December 2017 for her diabetes. She needs further follow up before any more refills on her maintenances medications

## 2017-02-25 ENCOUNTER — Other Ambulatory Visit: Payer: Self-pay | Admitting: *Deleted

## 2017-02-25 DIAGNOSIS — IMO0002 Reserved for concepts with insufficient information to code with codable children: Secondary | ICD-10-CM

## 2017-02-25 DIAGNOSIS — E118 Type 2 diabetes mellitus with unspecified complications: Principal | ICD-10-CM

## 2017-02-25 DIAGNOSIS — E1165 Type 2 diabetes mellitus with hyperglycemia: Secondary | ICD-10-CM

## 2017-02-25 MED ORDER — INSULIN ASPART 100 UNIT/ML ~~LOC~~ SOLN: 40.0000 [IU] | Freq: Every day | SUBCUTANEOUS | 0 refills | Status: DC

## 2017-03-03 ENCOUNTER — Ambulatory Visit (INDEPENDENT_AMBULATORY_CARE_PROVIDER_SITE_OTHER): Payer: Self-pay | Admitting: Internal Medicine

## 2017-03-03 ENCOUNTER — Other Ambulatory Visit: Payer: Self-pay

## 2017-03-03 VITALS — BP 112/80 | HR 100 | Temp 98.4°F | Ht 59.0 in | Wt 202.0 lb

## 2017-03-03 DIAGNOSIS — R6889 Other general symptoms and signs: Secondary | ICD-10-CM

## 2017-03-03 MED ORDER — BENZONATATE 200 MG PO CAPS: 200.0000 mg | ORAL_CAPSULE | Freq: Three times a day (TID) | ORAL | 0 refills | Status: DC | PRN

## 2017-03-03 MED ORDER — AZELASTINE HCL 0.1 % NA SOLN: 1.0000 | Freq: Two times a day (BID) | NASAL | 0 refills | Status: DC

## 2017-03-03 NOTE — Progress Notes (Signed)
   [facility] Phone: [phone]   Date of Visit: 03/03/2017   HPI:  Flu like Symptoms:  - symptoms started on Friday and worsened Saturday - she has body aches, fever/chills, rhinorrhea, sore throat, cough (dry mainly), ears popping, sometimes has a little sinus pressure. No diarrhea, nausea or vomiting, or abdominal pain.  - she initially had similar symptoms end of January beginning of February which resolved completely but now returned  - has triedTylenol cold and flu, robitussin, nyquil, dayquil, corasedin - no flu vaccine this season  - fiance was recently sick with cough, sneeze, runny nose, vomiting  ROS: See HPI.  [place]:  PMH: HTN DM2 OSA Allergic Rhinitis GERD  PHYSICAL EXAM: BP 112/80 (BP Location: Left Arm, Patient Position: Sitting, Cuff Size: Large)   Pulse 100   Temp 98.4 F (36.9 C) (Oral)   Ht 4\' 11"  (1.499 m)   Wt 202 lb (91.6 kg)   SpO2 94%   BMI 40.80 kg/m  GEN: NAD, nontoxic appearing HEENT: Atraumatic, normocephalic, neck supple, EOMI, sclera clear, oropharynx normal, moist mucous membranes CV: RRR, no murmurs, rubs, or gallops PULM: CTAB, normal effort SKIN: No rash or cyanosis; warm and well-perfused EXTR: No lower extremity edema or calf tenderness PSYCH: Mood and affect euthymic, normal rate and volume of speech NEURO: Awake, alert, no focal deficits grossly, normal speech   ASSESSMENT/PLAN:  Flu-like symptoms Patient is afebrile here.  Symptoms seem consistent with influenza.  Discussed Tamiflu use and benefits and side effects.  Patient declined.  Tessalon as needed and as a lasting nasal spray until symptoms resolve.  Encouraged fluid intake.  Discussed return precautions and ED precautions.  Follow-up next week for diabetes  Smiley Houseman, MD PGY [place]

## 2017-03-03 NOTE — Patient Instructions (Signed)
We can try Tessalon and nasal spray as needed for your symptoms  You have symptoms of the flu.   Please seek immediate medical care if your symptoms worsen, if you have trouble breathing or staying hydrated   Please check your sugars three times a day and keep a record of it   Please follow up with Dr. Emmaline Life next week for your diabetes

## 2017-03-04 ENCOUNTER — Telehealth: Payer: Self-pay

## 2017-03-04 NOTE — Telephone Encounter (Signed)
Called patient concerning another issue and was informed that she is vomiting and has diarrhea and was given a letter yesterday telling her to go back to work. She works around [employer] and food so she can not work.  She has the orange card and left here yesterday after getting her RX and [facility]. They do not carry the med that she was prescribed so it needs to be sent to [facility].  Could someone please call her and let her know when this is done please.Ozella Almond, CMA

## 2017-03-06 MED ORDER — BENZONATATE 200 MG PO CAPS: 200.0000 mg | ORAL_CAPSULE | Freq: Three times a day (TID) | ORAL | 0 refills | Status: DC | PRN

## 2017-03-06 MED ORDER — AZELASTINE HCL 0.1 % NA SOLN: 1.0000 | Freq: Two times a day (BID) | NASAL | 0 refills | Status: DC

## 2017-03-06 NOTE — Telephone Encounter (Signed)
Called patient back. She was upset that she did not receive a call sooner. She reports she never received medications from the health department and was told that they do not have the medications that were prescribed. I apologized that she had not had these medications for the past few days. She also reports that she had vomiting and diarrhea (which was not present when she came to clinic). She reports this has now resolved but she is still coughing a lot. I offered her to resend tessalon and nasal spray to [facility]. I offered to provide letter for work, but patient declined.   Rx sent to [facility]

## 2017-03-06 NOTE — Addendum Note (Signed)
Addended by: Smiley Houseman on: 03/06/2017 08:49 AM   Modules accepted: Orders

## 2017-03-13 ENCOUNTER — Ambulatory Visit (INDEPENDENT_AMBULATORY_CARE_PROVIDER_SITE_OTHER): Payer: Self-pay | Admitting: Internal Medicine

## 2017-03-13 ENCOUNTER — Encounter: Payer: Self-pay | Admitting: Internal Medicine

## 2017-03-13 VITALS — BP 115/80 | HR 79 | Temp 98.3°F | Ht 59.0 in | Wt 202.4 lb

## 2017-03-13 DIAGNOSIS — E118 Type 2 diabetes mellitus with unspecified complications: Secondary | ICD-10-CM

## 2017-03-13 DIAGNOSIS — E1165 Type 2 diabetes mellitus with hyperglycemia: Secondary | ICD-10-CM

## 2017-03-13 DIAGNOSIS — IMO0002 Reserved for concepts with insufficient information to code with codable children: Secondary | ICD-10-CM

## 2017-03-13 DIAGNOSIS — J302 Other seasonal allergic rhinitis: Secondary | ICD-10-CM

## 2017-03-13 DIAGNOSIS — J029 Acute pharyngitis, unspecified: Secondary | ICD-10-CM | POA: Insufficient documentation

## 2017-03-13 DIAGNOSIS — B379 Candidiasis, unspecified: Secondary | ICD-10-CM | POA: Insufficient documentation

## 2017-03-13 LAB — POCT GLYCOSYLATED HEMOGLOBIN (HGB A1C): Hemoglobin A1C: 12.4

## 2017-03-13 LAB — POCT RAPID STREP A (OFFICE): Rapid Strep A Screen: NEGATIVE

## 2017-03-13 MED ORDER — CETIRIZINE HCL 10 MG PO TABS: 10.0000 mg | ORAL_TABLET | Freq: Every day | ORAL | 11 refills | Status: DC

## 2017-03-13 MED ORDER — MOMETASONE FUROATE 50 MCG/ACT NA SUSP: 2.0000 | Freq: Every day | NASAL | 5 refills | Status: DC

## 2017-03-13 MED ORDER — FLUCONAZOLE 150 MG PO TABS: 150.0000 mg | ORAL_TABLET | Freq: Once | ORAL | 0 refills | Status: AC

## 2017-03-13 MED ORDER — INSULIN ASPART 100 UNIT/ML ~~LOC~~ SOLN: 45.0000 [IU] | Freq: Every day | SUBCUTANEOUS | 0 refills | Status: DC

## 2017-03-13 MED ORDER — INSULIN DEGLUDEC 200 UNIT/ML ~~LOC~~ SOPN
80.0000 [IU] | PEN_INJECTOR | Freq: Every morning | SUBCUTANEOUS | 0 refills | Status: DC
Start: 2017-03-13 — End: 2017-04-14

## 2017-03-13 NOTE — Assessment & Plan Note (Signed)
A1c of 12.3.  [name] previous regimen included NPH 75 3 times daily, NovoLog 45 once daily with her big meal, Trulicity 1.5 -Will have patient start Treciba 80 units daily in the morning  -Will continue Novolog 45 units with big meal  and Trucility 1.5 units  - At this appointment will consider starting patient on Jardiance -Patient to try to check blood sugars especially in the morning every day -Follow-up in 2 weeks  -diabetic foot exam performed, patient with some decreased sensation in her feet provided patient with information to continue moisturizing feet, always wear shoes, and check her feet at least once daily

## 2017-03-13 NOTE — Patient Instructions (Addendum)
It was nice seeing you today.  I want to really work on your diabetes and help you to get it under better control.  I want you to stop the NPH completely. I want you to start Tresiba at 80 units. Please follow up in two weeks. Please also make an appointment in the future for a pap smear.

## 2017-03-13 NOTE — Progress Notes (Signed)
   [facility] Kerrin Mo, MD Phone: [phone]  Reason For Visit: Establish care  # Sore throat  - Started for two weeks. Patient states it hurts to swallow, eat things. Has worsened. Patient denies any fevers.  Does note having cough at night.  She denies any significant congestion though states that she sometimes feels like she has aller  #CHRONIC DM, Type 2: Novolog: 45 units when she eats large meals. Usually has 1 large meal a day.  NPH: 75 units 3 times a day  Trulicity 1.5 mg once weekly  CBGs: CBG 450, 400 CBGs 230, - patient does not check blood sugars consistently  Lifestyle: Not discussed at this visit  Any hypoglycemia episodes: Denies palpations, diaphoresis, fatigue, weakness, jittery Denies polyuria, visual changes, numbness or tingling.  Past Medical History Reviewed problem list.  Medications- reviewed and updated No additions to family history Social history- patient is a non smoker  Objective: BP 115/80 (BP Location: Right Arm, Patient Position: Sitting, Cuff Size: Normal)   Pulse 79   Temp 98.3 F (36.8 C) (Oral)   Ht 4\' 11"  (1.499 m)   Wt 202 lb 6.4 oz (91.8 kg)   SpO2 95%   BMI 40.88 kg/m  Gen: NAD, alert, cooperative with exam Cardio: regular rate and rhythm, S1S2 heard, no murmurs appreciated Pulm: clear to auscultation bilaterally, no wheezes, rhonchi or rales Skin: dry, intact, no rashes or lesions  Diabetic Foot Exam - Simple   Simple Foot Form Diabetic Foot exam was performed with the following findings:  Yes 03/13/2017 10:06 AM  Visual Inspection No deformities, no ulcerations, no other skin breakdown bilaterally:  Yes Sensation Testing See comments:  Yes Pulse Check Posterior Tibialis and Dorsalis pulse intact bilaterally:  Yes Comments With several areas of decreased sensation to touch on monofilament testing.  Also notable for dry skin though no skin breakdown noted.     Assessment/Plan: See problem based  a/p  Uncontrolled type 2 diabetes mellitus with complication M6Q of 94.7.  [name] previous regimen included NPH 75 3 times daily, NovoLog 45 once daily with her big meal, Trulicity 1.5 -Will have patient start Treciba 80 units daily in the morning  -Will continue Novolog 45 units with big meal  and Trucility 1.5 units  - At this appointment will consider starting patient on Jardiance -Patient to try to check blood sugars especially in the morning every day -Follow-up in 2 weeks  -diabetic foot exam performed, patient with some decreased sensation in her feet provided patient with information to continue moisturizing feet, always wear shoes, and check her feet at least once daily   Sore throat Sore throat negative strep, likely either due to reflux or allergic rhinitis.  Given association with recent cold will treat for allergic rhinitis and postnasal drip - POCT rapid strep A - mometasone (NASONEX) 50 MCG/ACT nasal spray; Place 2 sprays into the nose daily.  Dispense: 17 g; Refill: 5 - cetirizine (ZYRTEC) 10 MG tablet; Take 1 tablet (10 mg total) by mouth daily.  Dispense: 30 tablet; Refill: 11

## 2017-03-13 NOTE — Assessment & Plan Note (Signed)
Sore throat negative strep, likely either due to reflux or allergic rhinitis.  Given association with recent cold will treat for allergic rhinitis and postnasal drip - POCT rapid strep A - mometasone (NASONEX) 50 MCG/ACT nasal spray; Place 2 sprays into the nose daily.  Dispense: 17 g; Refill: 5 - cetirizine (ZYRTEC) 10 MG tablet; Take 1 tablet (10 mg total) by mouth daily.  Dispense: 30 tablet; Refill: 11

## 2017-03-15 ENCOUNTER — Emergency Department (HOSPITAL_COMMUNITY)
Admission: EM | Admit: 2017-03-15 | Discharge: 2017-03-16 | Disposition: A | Discharge status: Home / Self Care | Payer: Self-pay | Attending: Emergency Medicine | Admitting: Emergency Medicine

## 2017-03-15 ENCOUNTER — Encounter (HOSPITAL_COMMUNITY): Payer: Self-pay | Admitting: Emergency Medicine

## 2017-03-15 ENCOUNTER — Other Ambulatory Visit: Payer: Self-pay

## 2017-03-15 DIAGNOSIS — R059 Cough, unspecified: Secondary | ICD-10-CM

## 2017-03-15 DIAGNOSIS — E119 Type 2 diabetes mellitus without complications: Secondary | ICD-10-CM | POA: Insufficient documentation

## 2017-03-15 DIAGNOSIS — Z79899 Other long term (current) drug therapy: Secondary | ICD-10-CM | POA: Insufficient documentation

## 2017-03-15 DIAGNOSIS — Z7982 Long term (current) use of aspirin: Secondary | ICD-10-CM | POA: Insufficient documentation

## 2017-03-15 DIAGNOSIS — Z794 Long term (current) use of insulin: Secondary | ICD-10-CM | POA: Insufficient documentation

## 2017-03-15 DIAGNOSIS — R05 Cough: Secondary | ICD-10-CM | POA: Insufficient documentation

## 2017-03-15 DIAGNOSIS — I10 Essential (primary) hypertension: Secondary | ICD-10-CM | POA: Insufficient documentation

## 2017-03-15 DIAGNOSIS — J029 Acute pharyngitis, unspecified: Secondary | ICD-10-CM | POA: Insufficient documentation

## 2017-03-15 DIAGNOSIS — E785 Hyperlipidemia, unspecified: Secondary | ICD-10-CM | POA: Insufficient documentation

## 2017-03-15 DIAGNOSIS — Z91010 Allergy to peanuts: Secondary | ICD-10-CM | POA: Insufficient documentation

## 2017-03-15 LAB — RAPID STREP SCREEN (MED CTR MEBANE ONLY): Streptococcus, Group A Screen (Direct): NEGATIVE

## 2017-03-15 MED ORDER — BENZONATATE 200 MG PO CAPS: 200.0000 mg | ORAL_CAPSULE | Freq: Three times a day (TID) | ORAL | 0 refills | Status: DC | PRN

## 2017-03-15 MED ORDER — ACETAMINOPHEN 325 MG PO TABS
650.0000 mg | ORAL_TABLET | Freq: Once | ORAL | Status: AC | PRN
  Administered 2017-03-15: 650 mg via ORAL
  Filled 2017-03-15: qty 2

## 2017-03-15 NOTE — ED Provider Notes (Signed)
[facility] EMERGENCY DEPARTMENT Provider Note   CSN: [id number] Arrival date & time: 03/15/17  2217     History   Chief Complaint Chief Complaint  Patient presents with  . Sore Throat    HPI Margaret Larsen is a 47 y.o. female.  The history is provided by the patient and medical records. No language interpreter was used.   Margaret Larsen is a 47 y.o. female  with a PMH of DM, HTN who presents to the Emergency Department complaining of sore throat and cough for the last week and a half.  Patient reports that she was seen for her symptoms at her primary care office 2 days ago.  She tested negative for strep at that PCP appointment.  She was started on Zyrtec and Nasonex which she has been doing, however symptoms have not improved in the last 2 days.  Every time she coughs, sore throat is irritated.  No fever or chills.  Able to tolerate p.o. without difficulty.  Past Medical History:  Diagnosis Date  . Diabetes mellitus   . Hemorrhoids 07/10/2010  . Hypertension   . Kidney stones   . Kidney stones   . Renal disorder     Patient Active Problem List   Diagnosis Date Noted  . Yeast infection 03/13/2017  . Sore throat 03/13/2017  . Vertigo 06/21/2016  . Cough 05/13/2016  . Abscess of gluteal region 04/19/2016  . Panic attack 02/06/2016  . Hair loss 12/13/2015  . Myalgia and myositis 06/19/2015  . Allergic rhinitis 04/25/2015  . Skin lesion of scalp 01/12/2015  . Hyperlipidemia 11/03/2014  . 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 Atypical squamous cells of undetermined significance (ASCUS) on Papanicolaou smear of cervix 03/22/2013  . At risk for healthcare associated infection 03/15/2013  . GERD (gastroesophageal reflux disease) 11/13/2012  . Glaucoma suspect of both eyes 06/25/2012  . OSA (obstructive sleep apnea) 10/18/2010  . Diabetic peripheral neuropathy ([place]) 01/23/2010  . PERSONAL HISTORY OF ALLERGY TO PEANUTS 11/02/2009  . PTSD  07/31/2009  . Uncontrolled type 2 diabetes mellitus with complication ([place]) 38/75/6433  . HYPERTENSION, BENIGN ESSENTIAL 11/12/2007  . DYSLIPIDEMIA 10/02/2007  . Bipolar disorder ([place]) 08/11/2007  . OBESITY, NOS 03/06/2006    Past Surgical History:  Procedure Laterality Date  . CESAREAN SECTION    . CHOLECYSTECTOMY    . fiborids removed    . TUBAL LIGATION      OB History    No data available       Home Medications    Prior to Admission medications   Medication Sig Start Date End Date Taking? Authorizing Provider  ACCU-CHEK FASTCLIX LANCETS MISC 1 each by Does not apply route 3 (three) times daily. ICD 9 250.92 06/29/13   Boykin Nearing, MD  albuterol (PROVENTIL HFA;VENTOLIN HFA) 108 (90 BASE) MCG/ACT inhaler Inhale 2 puffs into the lungs every 6 (six) hours as needed for wheezing. 07/29/14 07/29/15  Zenia Resides, MD  aspirin 81 MG EC tablet Take 81 mg by mouth daily. Reported on 05/19/2015    [provider]  azelastine (ASTELIN) 0.1 % nasal spray Place 1 spray into both nostrils 2 (two) times daily. Use in each nostril as directed 03/06/17   Smiley Houseman, MD  benzonatate (TESSALON) 200 MG capsule Take 1 capsule (200 mg total) by mouth 3 (three) times daily as needed for cough. 03/15/17   Roux Brandy, Ozella Almond, PA-C  Blood Glucose Monitoring Suppl (AGAMATRIX PRESTO PRO METER) DEVI 1  Device by Does not apply route once. 06/30/14   Zenia Resides, MD  cetirizine (ZYRTEC) 10 MG tablet Take 1 tablet (10 mg total) by mouth daily. 03/13/17   Mikell, Jeani Sow, MD  Dulaglutide (TRULICITY) 1.5 XB/1.4NW SOPN Inject 1.5 mg into the skin every Monday. Inject 1.5mg  into skin once weekly 08/26/16   Tonette Bihari, MD  EPINEPHrine (EPI-PEN) 0.3 mg/0.3 mL DEVI Inject 0.3 mLs (0.3 mg total) into the muscle as needed. For allergic reaction Patient not taking: Reported on 11/02/2015 08/05/11   Katherina Mires, MD  glucose blood (AGAMATRIX PRESTO TEST) test strip Use as  instructed 06/30/14   Zenia Resides, MD  insulin aspart (NOVOLOG) 100 UNIT/ML injection Inject 45 Units into the skin daily with lunch. 03/13/17   Mikell, Jeani Sow, MD  Insulin Degludec (TRESIBA FLEXTOUCH) 200 UNIT/ML SOPN Inject 80 Units into the skin every morning. 03/13/17   Mikell, Jeani Sow, MD  Insulin Syringe-Needle U-100 (INSULIN SYRINGE .3CC/31GX5/16") 31G X 5/16" 0.3 ML MISC Use 3 needles per day 06/28/13   Elayne Snare, MD  lisinopril (PRINIVIL,ZESTRIL) 10 MG tablet Take 1 tablet (10 mg total) by mouth daily. 10/03/14   Zenia Resides, MD  meclizine (ANTIVERT) 32 MG tablet Take 1 tablet (32 mg total) by mouth 3 (three) times daily as needed. 06/21/16   Virginia Crews, MD  mirtazapine (REMERON) 15 MG tablet Take 7.5 mg by mouth at bedtime.    [provider]  mometasone (NASONEX) 50 MCG/ACT nasal spray Place 2 sprays into the nose daily. 03/13/17   Mikell, Jeani Sow, MD  omeprazole (PRILOSEC) 20 MG capsule Take 1 capsule (20 mg total) by mouth daily. 08/14/16   Nicolette Bang, DO  pravastatin (PRAVACHOL) 80 MG tablet Take 1 tablet (80 mg total) by mouth daily. Patient not taking: Reported on 11/02/2015 05/19/15   Zenia Resides, MD    Family History Family History  Problem Relation Age of Onset  . Hypertension Mother   . Diabetes Mother   . Cancer Mother     Social History Social History   Tobacco Use  . Smoking status: Never Smoker  . Smokeless tobacco: Never Used  Substance Use Topics  . Alcohol use: No  . Drug use: No     Allergies   Metformin and related; Peanut-containing drug products; Codeine; Diphenhydramine hcl; Fexofenadine-pseudoephed er; Anette Guarneri [lurasidone hcl]; and Shellfish allergy   Review of Systems Review of Systems  Constitutional: Negative for chills and fever.  HENT: Positive for congestion and sore throat. Negative for trouble swallowing.   Respiratory: Positive for cough. Negative for shortness of breath.     Cardiovascular: Negative for chest pain.  Gastrointestinal: Negative for abdominal pain, diarrhea, nausea and vomiting.     Physical Exam Updated Vital Signs BP (!) 175/119 (BP Location: Right Arm)   Pulse 89   Temp 98 F (36.7 C) (Oral)   Resp 18   SpO2 100%   Physical Exam  Constitutional: She is oriented to person, place, and time. She appears well-developed and well-nourished. No distress.  HENT:  Head: Normocephalic and atraumatic.  OP with erythema, no exudates or tonsillar hypertrophy. + nasal congestion with mucosal edema.   Neck: Normal range of motion. Neck supple.  Cardiovascular: Normal rate, regular rhythm and normal heart sounds.  Pulmonary/Chest: Effort normal.  Lungs are clear to auscultation bilaterally - no w/r/r  Abdominal: Soft. She exhibits no distension. There is no tenderness.  Musculoskeletal: Normal range of motion.  Neurological: She is alert and oriented to person, place, and time.  Skin: Skin is warm and dry. She is not diaphoretic.  Nursing note and vitals reviewed.    ED Treatments / Results  Labs (all labs ordered are listed, but only abnormal results are displayed) Labs Reviewed  RAPID STREP SCREEN (NOT AT [facility])  CULTURE, GROUP A STREP [facility])    EKG  EKG Interpretation None       Radiology No results found.  Procedures Procedures (including critical care time)  Medications Ordered in ED Medications  acetaminophen (TYLENOL) tablet 650 mg (650 mg Oral Given 03/15/17 2239)     Initial Impression / Assessment and Plan / ED Course  I have reviewed the triage vital signs and the nursing notes.  Pertinent labs & imaging results that were available during my care of the patient were reviewed by me and considered in my medical decision making (see chart for details).    Margaret Larsen is a 47 y.o. female who presents to ED for persistent sore throat.  She was seen by her primary care doctor yesterday where she was tested for  strep and it was negative.  She was started on cetirizine and nasal spray which she has been compliant with.  She has not noticed any improvement in her symptoms.  On exam today, patient is afebrile, hemodynamically stable with clear lung exam.  She reports cough exacerbating her sore throat.  She has not taken any cough suppressants.  Rapid strep was again obtained in triage.  Results reviewed and negative. Evaluation does not show pathology that would require ongoing emergent intervention or inpatient treatment.  Will give Rx for Tessalon and have patient follow-up with primary care if her symptoms are not improving.  All questions answered.   Final Clinical Impressions(s) / ED Diagnoses   Final diagnoses:  Sore throat  Cough    ED Discharge Orders        Ordered    benzonatate (TESSALON) 200 MG capsule  3 times daily PRN     03/15/17 2346       Davaris Youtsey, Ozella Almond, PA-C 03/15/17 [place], [place], DO 03/16/17 [phone]

## 2017-03-15 NOTE — Discharge Instructions (Signed)
It was my pleasure taking care of you today!   Your strep test was negative today.   Cough medication as needed. Stay hydrated.   Follow up with your primary doctor if symptoms are not improving in the next 5-7 days.   Return to ER for new or worsening symptoms, any additional concerns.

## 2017-03-15 NOTE — ED Triage Notes (Signed)
She diagnosed with the flu last week and Thursday she went to [facility] family practice for a sore throat.  Patient said she was tested for Strep and it was negative.  Today she presents with sore throat and says it hurts to swallow.  She rates her pain 10/10.  She has taken nyquil and gargled salt water and it has not helped.

## 2017-03-18 LAB — CULTURE, GROUP A STREP (THRC)

## 2017-03-27 ENCOUNTER — Ambulatory Visit: Payer: Self-pay | Admitting: Internal Medicine

## 2017-03-28 ENCOUNTER — Ambulatory Visit (INDEPENDENT_AMBULATORY_CARE_PROVIDER_SITE_OTHER): Payer: Self-pay | Admitting: Student in an Organized Health Care Education/Training Program

## 2017-03-28 DIAGNOSIS — J029 Acute pharyngitis, unspecified: Secondary | ICD-10-CM

## 2017-03-28 DIAGNOSIS — J302 Other seasonal allergic rhinitis: Secondary | ICD-10-CM

## 2017-03-28 MED ORDER — CETIRIZINE HCL 10 MG PO TABS: 10.0000 mg | ORAL_TABLET | Freq: Every day | ORAL | 11 refills | Status: DC

## 2017-03-28 MED ORDER — MOMETASONE FUROATE 50 MCG/ACT NA SUSP: 2.0000 | Freq: Every day | NASAL | 5 refills | Status: DC

## 2017-03-28 NOTE — Patient Instructions (Signed)
It was a pleasure seeing you today in our clinic. Today we discussed sore throat. Here is the treatment plan we have discussed and agreed upon together:  I gave you a script for flonase and zyrtec.  Please get mucinex (guaifenisin) over the counter to help think out the mucous.  If your symptoms worsen or fail to improve over the next few weeks please give our office a call for another appointment.  Our clinic's number is [phone]. Please call with questions or concerns about what we discussed today.  Be well, Dr. Burr Medico

## 2017-03-28 NOTE — Assessment & Plan Note (Addendum)
Symptoms seem to be due to post nasal drip because they have been associated with congestion for the entire 2-3 weeks of symptoms. She does have a history of allergies. No fevers to think of infectious etiology. She reports she has not gotten her zyrtec or nasonex because they were not available at the pharmacy when she went to pick them up.  - cetirizine (ZYRTEC) 10 MG tablet; Take 1 tablet (10 mg total) by mouth daily.  Dispense: 30 tablet; Refill: 11 - mometasone (NASONEX) 50 MCG/ACT nasal spray; Place 2 sprays into the nose daily.  Dispense: 17 g; Refill: 5 - red flats/follow up precautions provided - let patient know she may continue to have symptoms over the next 2-3 weeks however if her symptoms worsen or persist she should return for further evaluation

## 2017-03-28 NOTE — Progress Notes (Signed)
Subjective:    Margaret Larsen - 47 y.o. female MRN [id number]  Date of birth: 1970/08/04  HPI Margaret Larsen is here for sore throat.  Sore throat Patient was seen on 3/7 in the office and on 3/9 in the ED for throat pain. Rapid strep tests were negative at each of these visits. She has been given Tessalon, cetirizine, and nasal spray during these visits.  Major symptoms: burning throat pain and pain in both ears Progression: Symptoms have not gotten better, she feels they have gotten worse. Trying to eat soft foods because of pain. Meds tried: Ibuprofen, aleve, Nyquil, did not get tessalon Sick contacts: none  Symptoms Fever: None, but has had chills Headache or face pain: none Tooth pain: none Sneezing: none Scratchy throat: yes Allergies: yes, but has not been able to pick up flonase or cetirizine Muscle aches: none Severe fatigue: yes Stiff neck: none Shortness of breath: none Rash: none Sore throat or swollen glands: yes   ROS see HPI Smoking Status noted  Health Maintenance:  Health Maintenance Due  Topic Date Due  . OPHTHALMOLOGY EXAM  03/06/2016  . PAP SMEAR  03/15/2016    -  reports that she has never smoked. She has never used smokeless tobacco. - Review of Systems: Per HPI. - Past Medical History: Patient Active Problem List   Diagnosis Date Noted  . Yeast infection 03/13/2017  . Sore throat 03/13/2017  . Vertigo 06/21/2016  . Cough 05/13/2016  . Abscess of gluteal region 04/19/2016  . Panic attack 02/06/2016  . Hair loss 12/13/2015  . Myalgia and myositis 06/19/2015  . Allergic rhinitis 04/25/2015  . Skin lesion of scalp 01/12/2015  . Hyperlipidemia 11/03/2014  . 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 Atypical squamous cells of undetermined significance (ASCUS) on Papanicolaou smear of cervix 03/22/2013  . At risk for healthcare associated infection 03/15/2013  . GERD (gastroesophageal reflux disease) 11/13/2012  . Glaucoma suspect  of both eyes 06/25/2012  . OSA (obstructive sleep apnea) 10/18/2010  . Diabetic peripheral neuropathy ([place]) 01/23/2010  . PERSONAL HISTORY OF ALLERGY TO PEANUTS 11/02/2009  . PTSD 07/31/2009  . Uncontrolled type 2 diabetes mellitus with complication ([place]) 81/82/9937  . HYPERTENSION, BENIGN ESSENTIAL 11/12/2007  . DYSLIPIDEMIA 10/02/2007  . Bipolar disorder ([place]) 08/11/2007  . OBESITY, NOS 03/06/2006   - Medications: reviewed and updated Current Outpatient Medications  Medication Sig Dispense Refill  . ACCU-CHEK FASTCLIX LANCETS MISC 1 each by Does not apply route 3 (three) times daily. ICD 9 250.92 102 each 11  . albuterol (PROVENTIL HFA;VENTOLIN HFA) 108 (90 BASE) MCG/ACT inhaler Inhale 2 puffs into the lungs every 6 (six) hours as needed for wheezing. 1 Inhaler 3  . aspirin 81 MG EC tablet Take 81 mg by mouth daily. Reported on 05/19/2015    . azelastine (ASTELIN) 0.1 % nasal spray Place 1 spray into both nostrils 2 (two) times daily. Use in each nostril as directed 30 mL 0  . benzonatate (TESSALON) 200 MG capsule Take 1 capsule (200 mg total) by mouth 3 (three) times daily as needed for cough. 30 capsule 0  . Blood Glucose Monitoring Suppl (AGAMATRIX PRESTO PRO METER) DEVI 1 Device by Does not apply route once. 1 Device 0  . cetirizine (ZYRTEC) 10 MG tablet Take 1 tablet (10 mg total) by mouth daily. 30 tablet 11  . Dulaglutide (TRULICITY) 1.5 JI/9.6VE SOPN Inject 1.5 mg into the skin every Monday. Inject 1.5mg  into skin once weekly 5 pen 0  .  EPINEPHrine (EPI-PEN) 0.3 mg/0.3 mL DEVI Inject 0.3 mLs (0.3 mg total) into the muscle as needed. For allergic reaction (Patient not taking: Reported on 11/02/2015) 1 Device 0  . glucose blood (AGAMATRIX PRESTO TEST) test strip Use as instructed 50 each prn  . insulin aspart (NOVOLOG) 100 UNIT/ML injection Inject 45 Units into the skin daily with lunch. 10 mL 0  . Insulin Degludec (TRESIBA FLEXTOUCH) 200 UNIT/ML SOPN Inject 80 Units into the skin  every morning. 3 pen 0  . Insulin Syringe-Needle U-100 (INSULIN SYRINGE .3CC/31GX5/16") 31G X 5/16" 0.3 ML MISC Use 3 needles per day 100 each 3  . lisinopril (PRINIVIL,ZESTRIL) 10 MG tablet Take 1 tablet (10 mg total) by mouth daily. 30 tablet prn  . meclizine (ANTIVERT) 32 MG tablet Take 1 tablet (32 mg total) by mouth 3 (three) times daily as needed. 30 tablet 0  . mirtazapine (REMERON) 15 MG tablet Take 7.5 mg by mouth at bedtime.    . mometasone (NASONEX) 50 MCG/ACT nasal spray Place 2 sprays into the nose daily. 17 g 5  . omeprazole (PRILOSEC) 20 MG capsule Take 1 capsule (20 mg total) by mouth daily. 30 capsule 1  . pravastatin (PRAVACHOL) 80 MG tablet Take 1 tablet (80 mg total) by mouth daily. (Patient not taking: Reported on 11/02/2015) 90 tablet 3   No current facility-administered medications for this visit.     Review of Systems See HPI     Objective:   Physical Exam BP (!) 140/92 (BP Location: Left Arm)   Pulse 88   Temp 97.8 F (36.6 C) (Oral)   Wt 204 lb (92.5 kg)   SpO2 94%   BMI 41.20 kg/m  Gen: NAD, alert, cooperative with exam, well-appearing  HEENT: NCAT, PERRL, clear conjunctiva, oropharynx mildly erythematous with +clear postnasal drip. No exudates. supple neck, +fluid noted behind each TM without evidence of infection CV: RRR, good S1/S2, no murmur, no edema, capillary refill brisk  Resp: CTABL, no wheezes, non-labored Abd: SNTND, BS present, no guarding or organomegaly Skin: no rashes, normal turgor  Neuro: no gross deficits.  Psych: good insight, alert and oriented     Assessment & Plan:   Sore throat Symptoms seem to be due to post nasal drip because they have been associated with congestion for the entire 2-3 weeks of symptoms. She does have a history of allergies. No fevers to think of infectious etiology. She reports she has not gotten her zyrtec or nasonex because they were not available at the pharmacy when she went to pick them up.  - cetirizine  (ZYRTEC) 10 MG tablet; Take 1 tablet (10 mg total) by mouth daily.  Dispense: 30 tablet; Refill: 11 - mometasone (NASONEX) 50 MCG/ACT nasal spray; Place 2 sprays into the nose daily.  Dispense: 17 g; Refill: 5 - red flats/follow up precautions provided - let patient know she may continue to have symptoms over the next 2-3 weeks however if her symptoms worsen or persist she should return for further evaluation  Everrett Coombe, MD,MS,  PGY2 03/28/2017 11:06 AM

## 2017-04-01 ENCOUNTER — Telehealth: Payer: Self-pay | Admitting: Internal Medicine

## 2017-04-01 NOTE — Telephone Encounter (Signed)
[name] blood sugar is high and she was told to call.  I offered patient to do triage schedule this afternoon as there are no more same day appts.  However, she just wants someone to call her back today please.

## 2017-04-02 NOTE — Telephone Encounter (Signed)
Called follow-up of low blood sugar request.  States blood sugar dropped to 81 yesterday and she had symptoms including feeling shakey and sweatng.  She managed appopriately. Today CBG is #238.   Offered follow-up visit with PCP or myself in the near future. She will call and reschedule.

## 2017-04-09 ENCOUNTER — Other Ambulatory Visit: Payer: Self-pay

## 2017-04-09 DIAGNOSIS — J302 Other seasonal allergic rhinitis: Secondary | ICD-10-CM

## 2017-04-09 MED ORDER — ALBUTEROL SULFATE HFA 108 (90 BASE) MCG/ACT IN AERS: 2.0000 | INHALATION_SPRAY | Freq: Four times a day (QID) | RESPIRATORY_TRACT | 3 refills | Status: DC | PRN

## 2017-04-14 ENCOUNTER — Ambulatory Visit (INDEPENDENT_AMBULATORY_CARE_PROVIDER_SITE_OTHER): Payer: Self-pay | Admitting: Internal Medicine

## 2017-04-14 ENCOUNTER — Other Ambulatory Visit: Payer: Self-pay

## 2017-04-14 ENCOUNTER — Encounter: Payer: Self-pay | Admitting: Internal Medicine

## 2017-04-14 DIAGNOSIS — IMO0002 Reserved for concepts with insufficient information to code with codable children: Secondary | ICD-10-CM

## 2017-04-14 DIAGNOSIS — E1165 Type 2 diabetes mellitus with hyperglycemia: Secondary | ICD-10-CM

## 2017-04-14 DIAGNOSIS — E118 Type 2 diabetes mellitus with unspecified complications: Secondary | ICD-10-CM

## 2017-04-14 MED ORDER — INSULIN DEGLUDEC 200 UNIT/ML ~~LOC~~ SOPN: 80.0000 [IU] | PEN_INJECTOR | Freq: Every morning | SUBCUTANEOUS | 0 refills | Status: DC

## 2017-04-14 MED ORDER — EMPAGLIFLOZIN 10 MG PO TABS: 10.0000 mg | ORAL_TABLET | Freq: Every day | ORAL | 0 refills | Status: DC

## 2017-04-14 NOTE — Progress Notes (Signed)
   [facility] Kerrin Mo, MD Phone: [phone]  Reason For Visit: SDA for T2DM  # CHRONIC DM, Type 2: Patient states that she missed her appointment with me due to a mixup on appointment dates Works from 3:30 - 12 PM (5 days week) Has her big meal at 9-10 PM at night  If patient works she gets up about 8-9 AM  CBGs:Checks blood sugars around 11 AM (after fixing something to eat in the morning)  Patient felt funny when her  CBG was 85 -that was first thing in the morning at 8/9 AM  CBGs 11 AM - mid 200s,  CBGs when getting ready to eat dinner are in the mid 200s  Meds: Tresiba 80 mg, Novolog 45 units (one time dose with nighttime meall), Truclity 1.5  Reports good compliance. Tolerating well w/o side-effects Any hypoglycemia episodes: Denies palpations, diaphoresis, fatigue, weakness, jittery  Past Medical History  Reviewed problem list.  Medications- reviewed and updated No additions to family history Social history- patient is a non- smoker  Objective: BP (!) 134/96   Pulse 85   Temp 98.2 F (36.8 C) (Oral)   Ht 4\' 11"  (1.499 m)   Wt 203 lb 12.8 oz (92.4 kg)   SpO2 96%   BMI 41.16 kg/m  Gen: NAD, alert, cooperative with exam MSK: Normal gait and station Skin: dry, intact, no rashes or lesions  Assessment/Plan: See problem based a/p  Uncontrolled type 2 diabetes mellitus with complication Blood sugars seem to have improved with current regiment Continue receive a 80 mg, NovoLog 45 units with nighttime meal, Trulicity 1.5 Will add on Jardiance 10 mg > patient does well can increase to 25 mg Discussed with patient checking her CBGs in the morning when she first gets up rather than at 11 AM after she is eaten' She states that she will be able to write down her blood sugars and is willing to do this Follow-up in 1 month

## 2017-04-14 NOTE — Patient Instructions (Signed)
I am going to start you on a oral medication called Jardiance.  You can take this in the morning with your other medications.  Please follow-up in 1 month.  Please check your blood sugars at 8 9 AM in the morning when you first get up in order to ensure that we are getting a fasting blood sugar replete.  Please record all your blood sugars in a notebook and bring those with you at your next visit, this way we can get you on a regiment which will have the best control for you.

## 2017-04-17 ENCOUNTER — Encounter: Payer: Self-pay | Admitting: Internal Medicine

## 2017-04-17 NOTE — Assessment & Plan Note (Signed)
Blood sugars seem to have improved with current regiment Continue receive a 80 mg, NovoLog 45 units with nighttime meal, Trulicity 1.5 Will add on Jardiance 10 mg > patient does well can increase to 25 mg Discussed with patient checking her CBGs in the morning when she first gets up rather than at 11 AM after she is eaten' She states that she will be able to write down her blood sugars and is willing to do this Follow-up in 1 month

## 2017-05-07 ENCOUNTER — Other Ambulatory Visit: Payer: Self-pay

## 2017-05-07 MED ORDER — DULAGLUTIDE 1.5 MG/0.5ML ~~LOC~~ SOAJ: 1.5000 mg | SUBCUTANEOUS | 0 refills | Status: DC

## 2017-05-07 NOTE — Addendum Note (Signed)
Addended by: Kerrin Mo Z on: 05/07/2017 04:48 PM   Modules accepted: Orders

## 2017-05-21 ENCOUNTER — Other Ambulatory Visit: Payer: Self-pay

## 2017-05-21 ENCOUNTER — Encounter: Payer: Self-pay | Admitting: Internal Medicine

## 2017-05-21 ENCOUNTER — Ambulatory Visit: Payer: Self-pay | Admitting: Family Medicine

## 2017-05-21 ENCOUNTER — Ambulatory Visit (INDEPENDENT_AMBULATORY_CARE_PROVIDER_SITE_OTHER): Payer: Self-pay | Admitting: Internal Medicine

## 2017-05-21 ENCOUNTER — Ambulatory Visit: Payer: Self-pay | Admitting: Internal Medicine

## 2017-05-21 VITALS — BP 122/88 | HR 86 | Temp 98.7°F | Ht 59.0 in | Wt 201.0 lb

## 2017-05-21 DIAGNOSIS — IMO0002 Reserved for concepts with insufficient information to code with codable children: Secondary | ICD-10-CM

## 2017-05-21 DIAGNOSIS — E118 Type 2 diabetes mellitus with unspecified complications: Secondary | ICD-10-CM

## 2017-05-21 DIAGNOSIS — E1142 Type 2 diabetes mellitus with diabetic polyneuropathy: Secondary | ICD-10-CM

## 2017-05-21 DIAGNOSIS — E1165 Type 2 diabetes mellitus with hyperglycemia: Secondary | ICD-10-CM

## 2017-05-21 LAB — GLUCOSE, POCT (MANUAL RESULT ENTRY): POC Glucose: 170 mg/dl — AB (ref 70–99)

## 2017-05-21 MED ORDER — EMPAGLIFLOZIN 10 MG PO TABS: 10.0000 mg | ORAL_TABLET | Freq: Every day | ORAL | 0 refills | Status: DC

## 2017-05-21 MED ORDER — INSULIN DEGLUDEC 200 UNIT/ML ~~LOC~~ SOPN: 80.0000 [IU] | PEN_INJECTOR | Freq: Every morning | SUBCUTANEOUS | 0 refills | Status: DC

## 2017-05-21 MED ORDER — INSULIN ASPART 100 UNIT/ML ~~LOC~~ SOLN: 40.0000 [IU] | Freq: Every day | SUBCUTANEOUS | 0 refills | Status: DC

## 2017-05-21 MED ORDER — AMITRIPTYLINE HCL 10 MG PO TABS: 10.0000 mg | ORAL_TABLET | Freq: Every day | ORAL | 0 refills | Status: DC

## 2017-05-21 NOTE — Progress Notes (Signed)
   [facility] Kerrin Mo, MD Phone: [phone]  Reason For Visit: Follow up T2DM  # CHRONIC DM, Type 2: Patient was previously seen by self for diabetes checkup.  I recently placed her on Jardiance 10 mg. CBGs: CBGs 300s  CBG max of 550 CBG low 70/80  - patient is unable to tell me the times of these lows, she believes she did not eat a meal  Meds: Current regimen includes Tresiba 80 u, NovoLog 40 u with largest meal, Trulicity 1.5 mg, Jardiance 10 mg  Reports increased urination associate with Vania Rea whic is particular bothersome at night Lifestyle: Not discussed  Any hypoglycemia episodes: Denies palpations, diaphoresis, fatigue, weakness, jittery  #Diabetic neuropathy -Patient states she has significant diabetic neuropathy in her arms and legs.  States that she has numbness and tingling in both of her feet.  Sometimes in her hands.  She has been previously on gabapentin which did not work and Lyrica which also did not work.  She wants dose there are anything else that she could try.  Past Medical History Reviewed problem list.  Medications- reviewed and updated No additions to family history Social history- patient is a non- smoker  Objective: BP 122/88   Pulse 86   Temp 98.7 F (37.1 C) (Oral)   Ht 4\' 11"  (1.499 m)   Wt 201 lb (91.2 kg)   SpO2 98%   BMI 40.60 kg/m  Gen: NAD, alert, cooperative with exam Cardio: regular rate and rhythm, S1S2 heard, no murmurs appreciated Pulm: clear to auscultation bilaterally, no wheezes, rhonchi or rales Skin: dry, intact, no rashes or lesions  Assessment/Plan: See problem based a/p   Uncontrolled type 2 diabetes mellitus with complication CBGs usually are within the 300 range she has had a couple of lows in the 70s and 80s therefore cannot go up on long-acting insulin. Continue Tresiba 80 un, Novolog  40 un (biggest meal of the day), [place] 1.5 mg, Jardiance 10 mg Patient to start taking NovoLog 10  un with second biggest meal of the day Complains of increased urination with Vania Rea -will continue the hopes that this will decrease as blood sugars get under better control Follow up in 1 month for A1C       Diabetic peripheral neuropathy Previously failed Lyrica and gabapentin We will trial patient on amitriptyline to see if this helps with diabetic neuropathy

## 2017-05-21 NOTE — Assessment & Plan Note (Signed)
CBGs usually are within the 300 range she has had a couple of lows in the 70s and 80s therefore cannot go up on long-acting insulin. Continue Tresiba 80 un, Novolog  40 un (biggest meal of the day), Trucilcity 1.5 mg, Jardiance 10 mg Patient to start taking NovoLog 10 un with second biggest meal of the day Complains of increased urination with Vania Rea -will continue the hopes that this will decrease as blood sugars get under better control Follow up in 1 month for A1C

## 2017-05-21 NOTE — Assessment & Plan Note (Signed)
Previously failed Lyrica and gabapentin We will trial patient on amitriptyline to see if this helps with diabetic neuropathy

## 2017-05-21 NOTE — Patient Instructions (Addendum)
Please check blood sugars and write down time at which they are checked. If your CBG 250 before a 1st meal, please take 10 units of Novolog. Please continue Jardiance, follow up with me 1 month. I am going to start you amitriypline for your neuropathy

## 2017-06-09 ENCOUNTER — Telehealth: Payer: Self-pay | Admitting: *Deleted

## 2017-06-09 DIAGNOSIS — IMO0002 Reserved for concepts with insufficient information to code with codable children: Secondary | ICD-10-CM

## 2017-06-09 DIAGNOSIS — E1165 Type 2 diabetes mellitus with hyperglycemia: Secondary | ICD-10-CM

## 2017-06-09 DIAGNOSIS — E1142 Type 2 diabetes mellitus with diabetic polyneuropathy: Secondary | ICD-10-CM

## 2017-06-09 DIAGNOSIS — E118 Type 2 diabetes mellitus with unspecified complications: Secondary | ICD-10-CM

## 2017-06-09 MED ORDER — INSULIN ASPART 100 UNIT/ML FLEXPEN: PEN_INJECTOR | SUBCUTANEOUS | 11 refills | Status: DC

## 2017-06-09 NOTE — Telephone Encounter (Signed)
Received fax from [facility] with the following message:  " pt states it is sometimes difficult to see the units on an insulin syringe. Would you consider writing a new Rx for novolog flexpen and write on it that pen is medically necessary? We can obtain the pens for free for her if it is medically necessary."  Will forward to MD for determination. Randall Rampersad, Salome Spotted, CMA

## 2017-06-09 NOTE — Telephone Encounter (Signed)
Sent in FlexPen prescription. Please let patient know this was sent in.

## 2017-06-18 ENCOUNTER — Encounter: Payer: Self-pay | Admitting: Internal Medicine

## 2017-06-18 ENCOUNTER — Ambulatory Visit (INDEPENDENT_AMBULATORY_CARE_PROVIDER_SITE_OTHER): Payer: Self-pay | Admitting: Internal Medicine

## 2017-06-18 VITALS — BP 130/80 | HR 72 | Temp 97.9°F | Ht 59.0 in | Wt 205.2 lb

## 2017-06-18 DIAGNOSIS — L259 Unspecified contact dermatitis, unspecified cause: Secondary | ICD-10-CM

## 2017-06-18 MED ORDER — TRIAMCINOLONE ACETONIDE 0.1 % EX CREA: 1.0000 "application " | TOPICAL_CREAM | Freq: Two times a day (BID) | CUTANEOUS | 0 refills | Status: DC

## 2017-06-18 NOTE — Assessment & Plan Note (Signed)
-  History of bipolar disorder and type 2 diabetes, will not provide oral prednisone - triamcinolone cream (KENALOG) 0.1 %; Apply 1 application topically 2 (two) times daily.  Dispense: 80 g; Refill: 0 - Follow up in 1 week if no improvement

## 2017-06-18 NOTE — Patient Instructions (Signed)
I am going to give you a steroid cream to use on this rash. I think it is likely an allergic reaction rash

## 2017-06-18 NOTE — Progress Notes (Signed)
   [facility] Kerrin Mo, MD Phone: [phone]  Reason For Visit: SDA for Rash    # RASH   Rash started about 1 week ago.  Was first noted on her arms and then on the inside of the underarm.  Is a very itchy rash.  Not associated with pain.  No new medications or topical agents.  No other members with rashes.  No oral lesions or ocular lesions, no fevers, no swelling,  no nausea or vomiting. Ppatient does note that she was outside about 2 weeks ago cutting branches off of trees and doing backyard work   Past Medical History Reviewed problem list.  Medications- reviewed and updated No additions to family history Social history- patient is a non- smoker  Objective: BP 130/80 (BP Location: Right Arm, Patient Position: Sitting, Cuff Size: Large)   Pulse 72   Temp 97.9 F (36.6 C) (Oral)   Ht 4\' 11"  (1.499 m)   Wt 205 lb 3.2 oz (93.1 kg)   SpO2 96%   BMI 41.45 kg/m  Gen: NAD, alert, cooperative with exam HEENT: Normal    Nose: nasal turbinates moist    Throat: moist mucus membranes, no erythema Cardio: regular rate and rhythm, S1S2 heard, no murmurs appreciated Skin: Linear lesions noted on the upper arms papules, confluency under the arms of patchy dry skin   Assessment/Plan: See problem based a/p  Contact dermatitis -History of bipolar disorder and type 2 diabetes, will not provide oral prednisone - triamcinolone cream (KENALOG) 0.1 %; Apply 1 application topically 2 (two) times daily.  Dispense: 80 g; Refill: 0 - Follow up in 1 week if no improvement

## 2017-06-25 ENCOUNTER — Encounter: Payer: Self-pay | Admitting: Internal Medicine

## 2017-06-25 ENCOUNTER — Ambulatory Visit: Payer: Self-pay | Admitting: Internal Medicine

## 2017-06-25 ENCOUNTER — Ambulatory Visit (INDEPENDENT_AMBULATORY_CARE_PROVIDER_SITE_OTHER): Payer: Self-pay | Admitting: Internal Medicine

## 2017-06-25 VITALS — BP 120/80 | HR 75 | Temp 98.0°F | Ht 59.0 in | Wt 203.8 lb

## 2017-06-25 DIAGNOSIS — E785 Hyperlipidemia, unspecified: Secondary | ICD-10-CM

## 2017-06-25 DIAGNOSIS — IMO0002 Reserved for concepts with insufficient information to code with codable children: Secondary | ICD-10-CM

## 2017-06-25 DIAGNOSIS — R21 Rash and other nonspecific skin eruption: Secondary | ICD-10-CM

## 2017-06-25 DIAGNOSIS — L259 Unspecified contact dermatitis, unspecified cause: Secondary | ICD-10-CM

## 2017-06-25 DIAGNOSIS — I10 Essential (primary) hypertension: Secondary | ICD-10-CM

## 2017-06-25 DIAGNOSIS — E1165 Type 2 diabetes mellitus with hyperglycemia: Secondary | ICD-10-CM

## 2017-06-25 DIAGNOSIS — E118 Type 2 diabetes mellitus with unspecified complications: Principal | ICD-10-CM

## 2017-06-25 LAB — POCT GLYCOSYLATED HEMOGLOBIN (HGB A1C): HBA1C, POC (CONTROLLED DIABETIC RANGE): 10.5 % — AB (ref 0.0–7.0)

## 2017-06-25 MED ORDER — INSULIN ASPART 100 UNIT/ML FLEXPEN: PEN_INJECTOR | SUBCUTANEOUS | 11 refills | Status: DC

## 2017-06-25 MED ORDER — INSULIN DEGLUDEC 200 UNIT/ML ~~LOC~~ SOPN: 86.0000 [IU] | PEN_INJECTOR | Freq: Every morning | SUBCUTANEOUS | 0 refills | Status: DC

## 2017-06-25 MED ORDER — PRAVASTATIN SODIUM 80 MG PO TABS: 80.0000 mg | ORAL_TABLET | Freq: Every day | ORAL | 3 refills | Status: DC

## 2017-06-25 MED ORDER — HYDROXYZINE HCL 10 MG PO TABS: 10.0000 mg | ORAL_TABLET | Freq: Two times a day (BID) | ORAL | 0 refills | Status: DC | PRN

## 2017-06-25 MED ORDER — HYDROCORTISONE 2.5 % EX OINT: TOPICAL_OINTMENT | Freq: Two times a day (BID) | CUTANEOUS | 3 refills | Status: DC

## 2017-06-25 NOTE — Progress Notes (Addendum)
   [facility] Kerrin Mo, MD Phone: [phone]  Reason For Visit: Follow up   # CHRONIC DM, Type 2: - usually only eats 1 -2 meals a day  CBGs: Fasting blood sugars are in the 200s, one low blood sugar of 81 at 12 PM  ( had not eaten enough to cover the Novolog.  Meds: Novolog 40 units with largest meal, Novolog 10 units with second biggest, Tresiba 80 units, Trulicity 1.5 mg, Jardiance 10  Reports  good compliance. Tolerating well w/o side-effects Lifestyle: Has been cutting back on bread and sweets.  Any hypoglycemia episodes: Denies palpations, diaphoresis, fatigue, weakness, jittery Denies polyuria, visual changes, numbness or tingling.  #Rash follow-up Patient still has eczematous rash present on her arms.  She states that the triamcinolone cream does not help and it burns her skin.  She does feel like overall the rash is improving.  However she still feels itchy at night.  Past Medical History Reviewed problem list.  Medications- reviewed and updated No additions to family history Social history- patient is a non-smoker  Objective: BP 120/80 (BP Location: Left Wrist, Patient Position: Sitting, Cuff Size: Normal)   Pulse 75   Temp 98 F (36.7 C) (Oral)   Ht 4\' 11"  (1.499 m)   Wt 203 lb 12.8 oz (92.4 kg)   SpO2 98%   BMI 41.16 kg/m  Gen: NAD, alert, cooperative with exam Cardio: regular rate and rhythm, S1S2 heard, no murmurs appreciated Pulm: clear to auscultation bilaterally, no wheezes, rhonchi or rales Skin: Eczematous rash noted on bilateral arms   Assessment/Plan: See problem based a/p  HYPERTENSION, BENIGN ESSENTIAL Blood pressure well controlled  Not currently on any hypertensive medication   Uncontrolled type 2 diabetes mellitus with complication Y6M 60.0, improving from 12.4  Continue Jardiance, Trucility Will increase Tresiba to 86 units, continue Novlog 40 units with largest meal + Novolog 10 units with second largest meal    Continue check CBGs  Obtain BMET, CBC, and lipid panel  Follow up in 1 month   Hyperlipidemia Restart pravastatin  Obtain lipid panel   Contact dermatitis Improving rash  - Stop triamcinolone, start hydrocortisone -Atarax at night for itching

## 2017-06-25 NOTE — Assessment & Plan Note (Signed)
Blood pressure well controlled  Not currently on any hypertensive medication

## 2017-06-25 NOTE — Assessment & Plan Note (Signed)
Restart pravastatin  Obtain lipid panel

## 2017-06-25 NOTE — Patient Instructions (Addendum)
I want you to increase your Tresiba to 86 units and continue the rest of your regiment. I am restarted your pravastatin. I am going to provide with a medication for your rash - atrax - start taking this at night, start the hydrocortisone cream.

## 2017-06-25 NOTE — Assessment & Plan Note (Signed)
Improving rash  - Stop triamcinolone, start hydrocortisone -Atarax at night for itching

## 2017-06-25 NOTE — Assessment & Plan Note (Addendum)
A1C 10.5, improving from 12.4  Continue Jardiance, Trucility Will increase Tresiba to 86 units, continue Novlog 40 units with largest meal + Novolog 10 units with second largest meal  Continue check CBGs  Obtain BMET, CBC, and lipid panel  Follow up in 1 month

## 2017-06-26 LAB — CBC
HEMATOCRIT: 41.1 % (ref 34.0–46.6)
Hemoglobin: 14.1 g/dL (ref 11.1–15.9)
MCH: 29.1 pg (ref 26.6–33.0)
MCHC: 34.3 g/dL (ref 31.5–35.7)
MCV: 85 fL (ref 79–97)
Platelets: 363 10*3/uL (ref 150–450)
RBC: 4.85 x10E6/uL (ref 3.77–5.28)
RDW: 13.9 % (ref 12.3–15.4)
WBC: 7.2 10*3/uL (ref 3.4–10.8)

## 2017-06-26 LAB — BASIC METABOLIC PANEL
BUN / CREAT RATIO: 15 (ref 9–23)
BUN: 9 mg/dL (ref 6–24)
CO2: 25 mmol/L (ref 20–29)
Calcium: 9.1 mg/dL (ref 8.7–10.2)
Chloride: 101 mmol/L (ref 96–106)
Creatinine, Ser: 0.59 mg/dL (ref 0.57–1.00)
GFR, EST AFRICAN AMERICAN: 124 mL/min/{1.73_m2} (ref >59)
GFR, EST NON AFRICAN AMERICAN: 108 mL/min/{1.73_m2} (ref >59)
Glucose: 113 mg/dL — ABNORMAL HIGH (ref 65–99)
Potassium: 4.1 mmol/L (ref 3.5–5.2)
Sodium: 140 mmol/L (ref 134–144)

## 2017-06-26 LAB — LIPID PANEL
CHOLESTEROL TOTAL: 216 mg/dL — AB (ref 100–199)
Chol/HDL Ratio: 4.8 ratio — ABNORMAL HIGH (ref 0.0–4.4)
HDL: 45 mg/dL (ref >39)
LDL Calculated: 148 mg/dL — ABNORMAL HIGH (ref 0–99)
TRIGLYCERIDES: 115 mg/dL (ref 0–149)
VLDL Cholesterol Cal: 23 mg/dL (ref 5–40)

## 2017-07-01 ENCOUNTER — Other Ambulatory Visit: Payer: Self-pay | Admitting: *Deleted

## 2017-07-01 MED ORDER — DULAGLUTIDE 1.5 MG/0.5ML ~~LOC~~ SOAJ: 1.5000 mg | SUBCUTANEOUS | 0 refills | Status: DC

## 2017-07-08 ENCOUNTER — Other Ambulatory Visit: Payer: Self-pay | Admitting: *Deleted

## 2017-07-08 DIAGNOSIS — E1165 Type 2 diabetes mellitus with hyperglycemia: Secondary | ICD-10-CM

## 2017-07-08 DIAGNOSIS — IMO0002 Reserved for concepts with insufficient information to code with codable children: Secondary | ICD-10-CM

## 2017-07-08 DIAGNOSIS — E118 Type 2 diabetes mellitus with unspecified complications: Principal | ICD-10-CM

## 2017-07-11 ENCOUNTER — Telehealth: Payer: Self-pay

## 2017-07-11 MED ORDER — EMPAGLIFLOZIN 10 MG PO TABS: 10.0000 mg | ORAL_TABLET | Freq: Every day | ORAL | 0 refills | Status: DC

## 2017-07-11 NOTE — Telephone Encounter (Signed)
[place] from the [facility] called. Drug company sent 200 ml Novalog instead for 100 ml. Would you send a new Rx. Also, they only have Hydroxyzine in 25mg  or 50 mg, not 10mg . Please call them with questions, [phone]. Ottis Stain, CMA

## 2017-07-18 LAB — GLUCOSE, POCT (MANUAL RESULT ENTRY): POC GLUCOSE: 66 mg/dL — AB (ref 70–99)

## 2017-07-23 NOTE — Telephone Encounter (Signed)
Called [facility] and spoke with [place].  I have placed orders for atarax 25 mg prn qhs.  Regarding Novolog, she will reach out to drug company to send the correct one.  Appreciate her assistance and will await an update from her.

## 2017-08-06 ENCOUNTER — Other Ambulatory Visit: Payer: Self-pay | Admitting: *Deleted

## 2017-08-06 DIAGNOSIS — IMO0002 Reserved for concepts with insufficient information to code with codable children: Secondary | ICD-10-CM

## 2017-08-06 DIAGNOSIS — J302 Other seasonal allergic rhinitis: Secondary | ICD-10-CM

## 2017-08-06 DIAGNOSIS — E118 Type 2 diabetes mellitus with unspecified complications: Secondary | ICD-10-CM

## 2017-08-06 DIAGNOSIS — E1165 Type 2 diabetes mellitus with hyperglycemia: Secondary | ICD-10-CM

## 2017-08-07 MED ORDER — INSULIN DEGLUDEC 200 UNIT/ML ~~LOC~~ SOPN: 86.0000 [IU] | PEN_INJECTOR | Freq: Every morning | SUBCUTANEOUS | 3 refills | Status: DC

## 2017-08-07 MED ORDER — DULAGLUTIDE 1.5 MG/0.5ML ~~LOC~~ SOAJ: 1.5000 mg | SUBCUTANEOUS | 0 refills | Status: DC

## 2017-08-07 MED ORDER — ALBUTEROL SULFATE HFA 108 (90 BASE) MCG/ACT IN AERS: 2.0000 | INHALATION_SPRAY | Freq: Four times a day (QID) | RESPIRATORY_TRACT | 3 refills | Status: DC | PRN

## 2017-10-28 ENCOUNTER — Ambulatory Visit (INDEPENDENT_AMBULATORY_CARE_PROVIDER_SITE_OTHER): Payer: Self-pay | Admitting: Family Medicine

## 2017-10-28 ENCOUNTER — Encounter: Payer: Self-pay | Admitting: Family Medicine

## 2017-10-28 ENCOUNTER — Other Ambulatory Visit: Payer: Self-pay

## 2017-10-28 VITALS — BP 140/100 | HR 83 | Temp 97.6°F | Ht 59.0 in | Wt 200.2 lb

## 2017-10-28 DIAGNOSIS — E118 Type 2 diabetes mellitus with unspecified complications: Secondary | ICD-10-CM

## 2017-10-28 DIAGNOSIS — E1165 Type 2 diabetes mellitus with hyperglycemia: Secondary | ICD-10-CM

## 2017-10-28 DIAGNOSIS — IMO0002 Reserved for concepts with insufficient information to code with codable children: Secondary | ICD-10-CM

## 2017-10-28 LAB — POCT GLYCOSYLATED HEMOGLOBIN (HGB A1C): HBA1C, POC (CONTROLLED DIABETIC RANGE): 10.7 % — AB (ref 0.0–7.0)

## 2017-10-28 NOTE — Assessment & Plan Note (Signed)
Slightly worsening with A1c 10.7 today.  Recommend continue Jardiance, Trulicity, Tresiba and NovoLog (40 units with largest meal and 10 units with second largest) .  Encourage diet and lifestyle improvements.  She states she has not been coming for follow ups due to orange card ineligibility.  Follow up in 3 months for next a1c check.

## 2017-10-28 NOTE — Progress Notes (Signed)
   Subjective:   Patient ID: Margaret Larsen    DOB: 1970-11-19, 47 y.o. female   MRN: [id number]  CC: f/u T2DM   HPI: Margaret Larsen is a 47 y.o. female who presents to clinic today for the following issue.  T2DM Reports she has never had well controlled blood sugars.  She states she refuses to come to clinic because she does not have eligibility for orange card.  Used to see Dr. Valentina Lucks, last seen 10/2015 per chart review.  A1c 10.7 today from 10.5 four months ago.  Medications: tresiba 86 units every morning, novolog, jardiance 10 mg daily and trulicity on Mondays.  Reports decent medication compliance, here and there she does not feel like taking it.  Does not check blood sugars because she has run out of test strips.  She used to check 1-2x a day, reports values in the high 200's.  Denies symptoms of hypoglycemia.  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  Eye exam: earlier this year Foot exam: reports she had this done within the past year  ROS: No fevers, chills, nausea, vomiting.  No CP, SOB, leg swelling.   Social: pt is a never smoker.  Medications reviewed. Objective:   BP (!) 140/100   Pulse 83   Temp 97.6 F (36.4 C) (Oral)   Ht 4\' 11"  (1.499 m)   Wt 200 lb 3.2 oz (90.8 kg)   SpO2 95%   BMI 40.44 kg/m  Vitals and nursing note reviewed.  General: 47 year old female, NAD Neck: supple CV: RRR no MRG  Lungs: CTAB, normal effort  Abdomen: soft, NTND, +bs  Skin: warm, dry, no rash Extremities: warm and well perfused  Assessment & Plan:   Uncontrolled type 2 diabetes mellitus with complication Slightly worsening with A1c 10.7 today.  Recommend continue Jardiance, Trulicity, Tresiba and NovoLog (40 units with largest meal and 10 units with second largest) .  Encourage diet and lifestyle improvements.  She states she has not been  coming for follow ups due to orange card ineligibility.  Follow up in 3 months for next a1c check.   Orders Placed This Encounter  Procedures  . HgB A1c   Lovenia Kim, MD [place] PGY-3

## 2017-10-28 NOTE — Patient Instructions (Signed)
It was nice meeting you today.  You were seen in clinic for uncontrolled type 2 diabetes.  We checked your A1c today and it was 10.7.  As we discussed, I would recommend eating a low glycemic diet with frequent healthy snacking in between.  Once your orange card eligibility comes through I would like you to resume checking your blood sugars.  In the meantime, I am not making any changes to your medication and would like you to continue taking on the same way.  It is important not to miss any doses.  I will follow-up with you in 3 months to recheck your A1c for improvement.  Please call clinic if you have any questions.  Lovenia Kim MD    Type 2 Diabetes Mellitus, Self Care, Adult When you have type 2 diabetes (type 2 diabetes mellitus), you must keep your blood sugar (glucose) under control. You can do this with:  Nutrition.  Exercise.  Lifestyle changes.  Medicines or insulin, if needed.  Support from your doctors and others.  How do I manage my blood sugar?  Check your blood sugar level every day, as often as told.  Call your doctor if your blood sugar is above your goal numbers for 2 tests in a row.  Have your A1c (hemoglobin A1c) level checked at least two times a year. Have it checked more often if your doctor tells you to. Your doctor will set treatment goals for you. Generally, you should have these blood sugar levels:  Before meals (preprandial): 80-130 mg/dL (4.4-7.2 mmol/L).  After meals (postprandial): lower than 180 mg/dL (10 mmol/L).  A1c level: less than 7%.  What do I need to know about high blood sugar? High blood sugar is called hyperglycemia. Know the signs of high blood sugar. Signs may include:  Feeling: ? Thirsty. ? Hungry. ? Very tired.  Needing to pee (urinate) more than usual.  Blurry vision.  What do I need to know about low blood sugar? Low blood sugar is called hypoglycemia. This is when blood sugar is at or below 70 mg/dL (3.9 mmol/L).  Symptoms may include:  Feeling: ? Hungry. ? Worried or nervous (anxious). ? Sweaty and clammy. ? Confused. ? Dizzy. ? Sleepy. ? Sick to your stomach (nauseous).  Having: ? A fast heartbeat (palpitations). ? A headache. ? A change in your vision. ? Jerky movements that you cannot control (seizure). ? Nightmares. ? Tingling or no feeling (numbness) around the mouth, lips, or tongue.  Having trouble with: ? Talking. ? Paying attention (concentrating). ? Moving (coordination). ? Sleeping.  Shaking.  Passing out (fainting).  Getting upset easily (irritability).  Treating low blood sugar  To treat low blood sugar, eat or drink something sugary right away. If you can think clearly and swallow safely, follow the 15:15 rule:  Take 15 grams of a fast-acting carb (carbohydrate). Some fast-acting carbs are: ? 1 tube of glucose gel. ? 3 sugar tablets (glucose pills). ? 6-8 pieces of hard candy. ? 4 oz (120 mL) of fruit juice. ? 4 oz (120 mL) regular (not diet) soda.  Check your blood sugar 15 minutes after you take the carb.  If your blood sugar is still at or below 70 mg/dL (3.9 mmol/L), take 15 grams of a carb again.  If your blood sugar does not go above 70 mg/dL (3.9 mmol/L) after 3 tries, get help right away.  After your blood sugar goes back to normal, eat a meal or a snack within 1  hour.  Treating very low blood sugar If your blood sugar is at or below 54 mg/dL (3 mmol/L), you have very low blood sugar (severe hypoglycemia). This is an emergency. Do not wait to see if the symptoms will go away. Get medical help right away. Call your local emergency services (911 in the [place] Do not drive yourself to the hospital. If you have very low blood sugar and you cannot eat or drink, you may need a glucagon shot (injection). A family member or friend should learn how to check your blood sugar and how to give you a glucagon shot. Ask your doctor if you need to have a glucagon shot  kit at home. What else is important to manage my diabetes? Medicine Follow these instructions about insulin and diabetes medicines:  Take them as told by your doctor.  Adjust them as told by your doctor.  Do not run out of them.  Having diabetes can raise your risk for other long-term conditions. These include heart or kidney disease. Your doctor may prescribe medicines to help prevent problems from diabetes. Food   Make healthy food choices. These include: ? Chicken, fish, egg whites, and beans. ? Oats, whole wheat, bulgur, brown rice, quinoa, and millet. ? Fresh fruits and vegetables. ? Low-fat dairy products. ? Nuts, avocado, olive oil, and canola oil.  Make a food plan with a specialist (dietitian).  Follow instructions from your doctor about what you cannot eat or drink.  Drink enough fluid to keep your pee (urine) clear or pale yellow.  Eat healthy snacks between healthy meals.  Keep track of carbs that you eat. Read food labels. Learn food serving sizes.  Follow your sick day plan when you cannot eat or drink normally. Make this plan with your doctor so it is ready to use. Activity  Exercise at least 3 times a week.  Do not go more than 2 days without exercising.  Talk with your doctor before you start a new exercise. Your doctor may need to adjust your insulin, medicines, or food. Lifestyle   Do not use any tobacco products. These include cigarettes, chewing tobacco, and e-cigarettes.If you need help quitting, ask your doctor.  Ask your doctor how much alcohol is safe for you.  Learn to deal with stress. If you need help with this, ask your doctor. Body care  Stay up to date with your shots (immunizations).  Have your eyes and feet checked by a doctor as often as told.  Check your skin and feet every day. Check for cuts, bruises, redness, blisters, or sores.  Brush your teeth and gums two times a day.  Floss at least one time a day.  Go to the  dentist least one time every 6 months.  Stay at a healthy weight. General instructions   Take over-the-counter and prescription medicines only as told by your doctor.  Share your diabetes care plan with: ? Your work or school. ? People you live with.  Check your pee (urine) for ketones: ? When you are sick. ? As told by your doctor.  Carry a card or wear jewelry that says that you have diabetes.  Ask your doctor: ? Do I need to meet with a diabetes educator? ? Where can I find a support group for people with diabetes?  Keep all follow-up visits as told by your doctor. This is important. Where to find more information: To learn more about diabetes, visit:  American Diabetes Association: www.diabetes.org  American Association  of Diabetes Educators: www.diabeteseducator.org/patient-resources  This information is not intended to replace advice given to you by your health care provider. Make sure you discuss any questions you have with your health care provider. Document Released: 04/17/2015 Document Revised: 06/01/2015 Document Reviewed: 01/27/2015 Elsevier Interactive Patient Education  [employer].

## 2017-11-19 ENCOUNTER — Other Ambulatory Visit: Payer: Self-pay | Admitting: Internal Medicine

## 2017-11-19 DIAGNOSIS — J302 Other seasonal allergic rhinitis: Secondary | ICD-10-CM

## 2017-11-19 DIAGNOSIS — J029 Acute pharyngitis, unspecified: Secondary | ICD-10-CM

## 2017-12-15 NOTE — Telephone Encounter (Signed)
Error

## 2018-02-02 ENCOUNTER — Other Ambulatory Visit: Payer: Self-pay | Admitting: Family Medicine

## 2018-02-02 DIAGNOSIS — J302 Other seasonal allergic rhinitis: Secondary | ICD-10-CM

## 2018-02-02 DIAGNOSIS — J029 Acute pharyngitis, unspecified: Secondary | ICD-10-CM

## 2018-02-23 ENCOUNTER — Other Ambulatory Visit: Payer: Self-pay

## 2018-02-23 DIAGNOSIS — E118 Type 2 diabetes mellitus with unspecified complications: Principal | ICD-10-CM

## 2018-02-23 DIAGNOSIS — IMO0002 Reserved for concepts with insufficient information to code with codable children: Secondary | ICD-10-CM

## 2018-02-23 DIAGNOSIS — E1165 Type 2 diabetes mellitus with hyperglycemia: Secondary | ICD-10-CM

## 2018-02-23 MED ORDER — INSULIN DEGLUDEC 200 UNIT/ML ~~LOC~~ SOPN: 86.0000 [IU] | PEN_INJECTOR | Freq: Every morning | SUBCUTANEOUS | 3 refills | Status: DC

## 2018-02-23 MED ORDER — DULAGLUTIDE 1.5 MG/0.5ML ~~LOC~~ SOAJ: 1.5000 mg | SUBCUTANEOUS | 0 refills | Status: DC

## 2018-02-24 ENCOUNTER — Telehealth: Payer: Self-pay

## 2018-02-24 NOTE — Telephone Encounter (Signed)
Dawn, pharmacist with [facility], called.  Has questions re: Tyler Aas sent yesterday.  Dawn states patient has been receiving Tresiba 100 units/mL and taking 80 units q AM. Frances [place]wood free from manufacturer.  New Rx is for 200 units/mL and dose is 86 units per AM.  Please call Dawn and verify that new Rx is correct.  Call back is [phone]  Danley Danker, RN [facility])

## 2018-02-26 ENCOUNTER — Ambulatory Visit: Payer: Self-pay

## 2018-02-26 NOTE — Telephone Encounter (Signed)
Called Dawn yesterday to clarify orders and LVM.  Let me know if they call back for further clarification, thanks

## 2018-03-17 ENCOUNTER — Encounter: Payer: Self-pay | Admitting: Family Medicine

## 2018-03-17 ENCOUNTER — Ambulatory Visit (INDEPENDENT_AMBULATORY_CARE_PROVIDER_SITE_OTHER): Payer: Self-pay | Admitting: Family Medicine

## 2018-03-17 VITALS — BP 128/88 | HR 80 | Temp 98.1°F | Ht 59.0 in | Wt 202.0 lb

## 2018-03-17 DIAGNOSIS — J029 Acute pharyngitis, unspecified: Secondary | ICD-10-CM

## 2018-03-17 DIAGNOSIS — B349 Viral infection, unspecified: Secondary | ICD-10-CM

## 2018-03-17 DIAGNOSIS — E1165 Type 2 diabetes mellitus with hyperglycemia: Secondary | ICD-10-CM

## 2018-03-17 DIAGNOSIS — IMO0002 Reserved for concepts with insufficient information to code with codable children: Secondary | ICD-10-CM

## 2018-03-17 DIAGNOSIS — E118 Type 2 diabetes mellitus with unspecified complications: Secondary | ICD-10-CM

## 2018-03-17 LAB — GLUCOSE, POCT (MANUAL RESULT ENTRY): POC Glucose: 246 mg/dl — AB (ref 70–99)

## 2018-03-17 LAB — POCT GLYCOSYLATED HEMOGLOBIN (HGB A1C): HbA1c, POC (controlled diabetic range): 12.3 % — AB (ref 0.0–7.0)

## 2018-03-17 MED ORDER — CETIRIZINE HCL 10 MG PO TABS: 10.0000 mg | ORAL_TABLET | Freq: Every day | ORAL | 0 refills | Status: DC

## 2018-03-17 MED ORDER — DICLOFENAC POTASSIUM 50 MG PO TABS: 50.0000 mg | ORAL_TABLET | Freq: Two times a day (BID) | ORAL | 0 refills | Status: DC | PRN

## 2018-03-17 MED ORDER — CETIRIZINE HCL 10 MG PO TABS: 10.0000 mg | ORAL_TABLET | Freq: Every day | ORAL | 11 refills | Status: DC

## 2018-03-17 NOTE — Progress Notes (Signed)
Subjective  Margaret Larsen is a 48 y.o. female is presenting with the following  SICK For last week has had diarrhea, muscle aches, nausea, headache and aching all over.  No specific sick contacts but works at [employer].  Taking pepto bismol and aleve.  No fever or focal pain or vomiting or rash.  DIABETES  Working to get Lantus.  Not checking her blood sugar.  Does not seem to want to talk about diabetes - will do so at her upcomikng appointment.   Chief Complaint noted Review of Symptoms - see HPI PMH - Smoking status noted.    Objective Vital Signs reviewed There were no vitals taken for this visit. Alert nad Lungs:  Normal respiratory effort, chest expands symmetrically. Lungs are clear to auscultation, no crackles or wheezes. Throat: normal mucosa, no exudate, uvula midline, no redness Neck:  No deformities, thyromegaly, masses, or tenderness noted.   Supple with full range of motion without pain. Abdomen: soft and non-tender without masses, organomegaly or hernias noted.  No guarding or rebound Heart - Regular rate and rhythm.  No murmurs, gallops or rubs.    Skin:  Intact without suspicious lesions or rashes Ears:  External ear exam shows no significant lesions or deformities.  Otoscopic examination reveals clear canals, tympanic membranes are intact bilaterally without bulging, retraction, inflammation or discharge. Hearing is grossly normal bilaterall  Assessments/Plans  VIRAL SYNDROME No signs of focal bacterial infection.  Treat symptomatically  DIABETES Poorly controlled but no evidence of HHS.  Follow up with her pcp as scheduled   See after visit summary for details of patient instuctions  No problem-specific Assessment & Plan notes found for this encounter.

## 2018-03-17 NOTE — Progress Notes (Addendum)
Subjective

## 2018-03-17 NOTE — Patient Instructions (Addendum)
Good to see you today!  Thanks for coming in.  You have a virus.  It should be gone in one week.  If not or if you have high fever then come back Take the diclofenac twice a day as needed for muscle aches  Please see your regular doctor for a PAP SMEAR - you have a history of abnormal paps and could have cancer  Please see your regular doctor to follow up on DIABETES.  Bring all your Medications when you come

## 2018-03-18 ENCOUNTER — Emergency Department (HOSPITAL_COMMUNITY): Payer: Self-pay

## 2018-03-18 ENCOUNTER — Emergency Department (HOSPITAL_COMMUNITY)
Admission: EM | Admit: 2018-03-18 | Discharge: 2018-03-18 | Disposition: A | Discharge status: Home / Self Care | Payer: Self-pay | Attending: Emergency Medicine | Admitting: Emergency Medicine

## 2018-03-18 ENCOUNTER — Other Ambulatory Visit: Payer: Self-pay

## 2018-03-18 ENCOUNTER — Encounter (HOSPITAL_COMMUNITY): Payer: Self-pay

## 2018-03-18 DIAGNOSIS — Z79899 Other long term (current) drug therapy: Secondary | ICD-10-CM | POA: Insufficient documentation

## 2018-03-18 DIAGNOSIS — Z7982 Long term (current) use of aspirin: Secondary | ICD-10-CM | POA: Insufficient documentation

## 2018-03-18 DIAGNOSIS — Z91010 Allergy to peanuts: Secondary | ICD-10-CM | POA: Insufficient documentation

## 2018-03-18 DIAGNOSIS — N3000 Acute cystitis without hematuria: Secondary | ICD-10-CM | POA: Insufficient documentation

## 2018-03-18 DIAGNOSIS — I10 Essential (primary) hypertension: Secondary | ICD-10-CM | POA: Insufficient documentation

## 2018-03-18 DIAGNOSIS — E119 Type 2 diabetes mellitus without complications: Secondary | ICD-10-CM | POA: Insufficient documentation

## 2018-03-18 DIAGNOSIS — Z794 Long term (current) use of insulin: Secondary | ICD-10-CM | POA: Insufficient documentation

## 2018-03-18 LAB — CBC
HCT: 43.8 % (ref 36.0–46.0)
Hemoglobin: 14.4 g/dL (ref 12.0–15.0)
MCH: 27.7 pg (ref 26.0–34.0)
MCHC: 32.9 g/dL (ref 30.0–36.0)
MCV: 84.2 fL (ref 80.0–100.0)
PLATELETS: 372 10*3/uL (ref 150–400)
RBC: 5.2 MIL/uL — ABNORMAL HIGH (ref 3.87–5.11)
RDW: 12.5 % (ref 11.5–15.5)
WBC: 7.7 10*3/uL (ref 4.0–10.5)
nRBC: 0 % (ref 0.0–0.2)

## 2018-03-18 LAB — URINALYSIS, ROUTINE W REFLEX MICROSCOPIC
Bilirubin Urine: NEGATIVE
Glucose, UA: NEGATIVE mg/dL
Hgb urine dipstick: NEGATIVE
Ketones, ur: NEGATIVE mg/dL
Nitrite: POSITIVE — AB
PH: 6 (ref 5.0–8.0)
Protein, ur: NEGATIVE mg/dL
Specific Gravity, Urine: 1.018 (ref 1.005–1.030)
WBC, UA: >50 WBC/hpf — ABNORMAL HIGH (ref 0–5)

## 2018-03-18 LAB — COMPREHENSIVE METABOLIC PANEL
ALK PHOS: 109 U/L (ref 38–126)
ALT: 11 U/L (ref 0–44)
AST: 15 U/L (ref 15–41)
Albumin: 3.3 g/dL — ABNORMAL LOW (ref 3.5–5.0)
Anion gap: 9 (ref 5–15)
BUN: 8 mg/dL (ref 6–20)
CALCIUM: 9 mg/dL (ref 8.9–10.3)
CO2: 27 mmol/L (ref 22–32)
Chloride: 98 mmol/L (ref 98–111)
Creatinine, Ser: 0.58 mg/dL (ref 0.44–1.00)
GFR calc Af Amer: >60 mL/min (ref >60)
Glucose, Bld: 292 mg/dL — ABNORMAL HIGH (ref 70–99)
Potassium: 4.1 mmol/L (ref 3.5–5.1)
Sodium: 134 mmol/L — ABNORMAL LOW (ref 135–145)
Total Bilirubin: 0.5 mg/dL (ref 0.3–1.2)
Total Protein: 7.6 g/dL (ref 6.5–8.1)

## 2018-03-18 LAB — LIPASE, BLOOD: Lipase: 27 U/L (ref 11–51)

## 2018-03-18 LAB — I-STAT BETA HCG BLOOD, ED (MC, WL, AP ONLY): I-stat hCG, quantitative: <5 m[IU]/mL (ref <5)

## 2018-03-18 LAB — CBG MONITORING, ED: Glucose-Capillary: 266 mg/dL — ABNORMAL HIGH (ref 70–99)

## 2018-03-18 MED ORDER — CEPHALEXIN 500 MG PO CAPS: 500.0000 mg | ORAL_CAPSULE | Freq: Two times a day (BID) | ORAL | 0 refills | Status: DC

## 2018-03-18 MED ORDER — CEPHALEXIN 500 MG PO CAPS: 500.0000 mg | ORAL_CAPSULE | Freq: Two times a day (BID) | ORAL | 0 refills | Status: AC

## 2018-03-18 MED ORDER — SODIUM CHLORIDE 0.9 % IV BOLUS
1000.0000 mL | Freq: Once | INTRAVENOUS | Status: AC
  Administered 2018-03-18: 1000 mL via INTRAVENOUS

## 2018-03-18 MED ORDER — SODIUM CHLORIDE 0.9% FLUSH
3.0000 mL | Freq: Once | INTRAVENOUS | Status: AC
  Administered 2018-03-18: 3 mL via INTRAVENOUS

## 2018-03-18 MED ORDER — ONDANSETRON HCL 4 MG/2ML IJ SOLN
4.0000 mg | Freq: Once | INTRAMUSCULAR | Status: AC
  Administered 2018-03-18: 4 mg via INTRAVENOUS
  Filled 2018-03-18: qty 2

## 2018-03-18 NOTE — ED Notes (Signed)
Pt reports abd pains, nausea, vomiting, diarrhea, SOB, and dizziness for the past week. Pt reports she has not vomited since Monday and has had 5 bouts of diarrhea in the last 24 hours. Pt denies any recent antibiotic usage.

## 2018-03-18 NOTE — ED Provider Notes (Addendum)
[facility] DEPART[facility] Provider Note   CSN: [id number] Arrival date & time: 03/18/18  1228    History   Chief Complaint Chief Complaint  Patient presents with  . Abdominal Pain  . Diarrhea  . Dizziness    HPI Margaret Larsen is a 48 y.o. female.     48 y.o female with a PMH of DM, Kidney stones and vertigo presents to the ED with a chief complaint of abdominal pain, multiple episodes of diarrhea, vomiting.  She reports having flulike symptoms last week such as chills, cough, congestion.  Reports this week she has had diarrhea for the past 5 days, she reports these episodes are nonbloody.  She also endorses vomiting multiple times after eating.  Patient reports some dizziness along with stating that she has numbness to the right side of her body, previously told she had diabetic neuropathy.  She reports she has taken Pepto-Bismol, Excedrin, Tylenol, Advil, Tylenol Cold and flu, NyQuil with some improvement in her symptoms.  She also reports feeling more short of breath with walking outside, states that the cold air makes her chest tighten, does have a previous history of allergies.  Patient states she is compliant with her diabetes medication.  She has an extensive abdominal surgical history such as 4 C-sections, cholecystectomy.  She denies any fever, urinary symptoms, recent antibiotic use, changes in her diet.     Past Medical History:  Diagnosis Date  . Diabetes mellitus   . Hemorrhoids 07/10/2010  . Hypertension   . Kidney stones   . Kidney stones   . Renal disorder     Patient Active Problem List   Diagnosis Date Noted  . Contact dermatitis 06/18/2017  . Yeast infection 03/13/2017  . Sore throat 03/13/2017  . Vertigo 06/21/2016  . Cough 05/13/2016  . Abscess of gluteal region 04/19/2016  . Panic attack 02/06/2016  . Hair loss 12/13/2015  . Myalgia and myositis 06/19/2015  . Allergic rhinitis 04/25/2015  . Skin lesion of scalp  01/12/2015  . Hyperlipidemia 11/03/2014  . 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 Atypical squamous cells of undetermined significance (ASCUS) on Papanicolaou smear of cervix 03/22/2013  . At risk for healthcare associated infection 03/15/2013  . GERD (gastroesophageal reflux disease) 11/13/2012  . Glaucoma suspect of both eyes 06/25/2012  . OSA (obstructive sleep apnea) 10/18/2010  . Diabetic peripheral neuropathy ([place]) 01/23/2010  . PERSONAL HISTORY OF ALLERGY TO PEANUTS 11/02/2009  . PTSD 07/31/2009  . Uncontrolled type 2 diabetes mellitus with complication ([place]) 44/31/5400  . HYPERTENSION, BENIGN ESSENTIAL 11/12/2007  . DYSLIPIDEMIA 10/02/2007  . Bipolar disorder ([place]) 08/11/2007  . OBESITY, NOS 03/06/2006    Past Surgical History:  Procedure Laterality Date  . CESAREAN SECTION    . CHOLECYSTECTOMY    . fiborids removed    . TUBAL LIGATION       OB History   No obstetric history on file.      Home Medications    Prior to Admission medications   Medication Sig Start Date End Date Taking? Authorizing Provider  ACCU-CHEK FASTCLIX LANCETS MISC 1 each by Does not apply route 3 (three) times daily. ICD 9 250.92 06/29/13   Funches, Adriana Mccallum, MD  albuterol (PROVENTIL HFA;VENTOLIN HFA) 108 (90 Base) MCG/ACT inhaler Inhale 2 puffs into the lungs every 6 (six) hours as needed for wheezing. 08/07/17 08/07/18  Lovenia Kim, MD  aspirin 81 MG EC tablet Take 81 mg by mouth daily. Reported on 05/19/2015  [provider]  Blood Glucose Monitoring Suppl (AGAMATRIX PRESTO PRO METER) DEVI 1 Device by Does not apply route once. 06/30/14   Zenia Resides, MD  cephALEXin (KEFLEX) 500 MG capsule Take 1 capsule (500 mg total) by mouth 2 (two) times daily for 7 days. 03/18/18 03/25/18  Janeece Fitting, PA-C  cetirizine (ZYRTEC) 10 MG tablet Take 1 tablet (10 mg total) by mouth daily. 03/17/18   Lind Covert, MD  diclofenac (CATAFLAM) 50 MG tablet Take 1 tablet (50 mg total) by mouth  2 (two) times daily as needed. 03/17/18   Lind Covert, MD  Dulaglutide (TRULICITY) 1.5 GM/0.1UU SOPN Inject 1.5 mg into the skin every Monday. Inject 1.5mg  into skin once weekly 02/23/18   Lovenia Kim, MD  empagliflozin (JARDIANCE) 10 MG TABS tablet Take 10 mg by mouth daily. 07/11/17   Lovenia Kim, MD  EPINEPHrine (EPI-PEN) 0.3 mg/0.3 mL DEVI Inject 0.3 mLs (0.3 mg total) into the muscle as needed. For allergic reaction Patient not taking: Reported on 11/02/2015 08/05/11   Katherina Mires, MD  glucose blood (AGAMATRIX PRESTO TEST) test strip Use as instructed 06/30/14   Zenia Resides, MD  hydrocortisone 2.5 % ointment Apply topically 2 (two) times daily. As needed for mild eczema.  Do not use for more than 1-2 weeks at a time. 06/25/17   Mikell, Jeani Sow, MD  hydrOXYzine (ATARAX/VISTARIL) 10 MG tablet Take 1 tablet (10 mg total) by mouth 2 (two) times daily as needed. 06/25/17   Mikell, Jeani Sow, MD  insulin aspart (NOVOLOG) 100 UNIT/ML FlexPen 40 units with largest meal, 10 units with 2nd largest 06/25/17   Mikell, Jeani Sow, MD  Insulin Degludec (TRESIBA FLEXTOUCH) 200 UNIT/ML SOPN Inject 86 Units into the skin every morning. 02/23/18   Lovenia Kim, MD  Insulin Syringe-Needle U-100 (INSULIN SYRINGE .3CC/31GX5/16") 31G X 5/16" 0.3 ML MISC Use 3 needles per day 06/28/13   Elayne Snare, MD  NASONEX 50 MCG/ACT nasal spray PLACE 2 SPRAYS INTO THE NOSE DAILY. 02/03/18   Lovenia Kim, MD  omeprazole (PRILOSEC) 20 MG capsule Take 1 capsule (20 mg total) by mouth daily. 08/14/16   Nicolette Bang, DO  pravastatin (PRAVACHOL) 80 MG tablet Take 1 tablet (80 mg total) by mouth daily. 06/25/17   Tonette Bihari, MD    Family History Family History  Problem Relation Age of Onset  . Hypertension Mother   . Diabetes Mother   . Cancer Mother     Social History Social History   Tobacco Use  . Smoking status: Never Smoker  . Smokeless tobacco: Never Used  Substance Use  Topics  . Alcohol use: No  . Drug use: No     Allergies   Metformin and related; Peanut-containing drug products; Codeine; Diphenhydramine hcl; Fexofenadine-pseudoephed er; Anette Guarneri [lurasidone hcl]; and Shellfish allergy   Review of Systems Review of Systems  Constitutional: Negative for chills and fever.  HENT: Negative for ear pain and sore throat.   Eyes: Negative for pain and visual disturbance.  Respiratory: Negative for cough and shortness of breath.   Cardiovascular: Negative for chest pain and palpitations.  Gastrointestinal: Positive for abdominal pain, diarrhea, nausea and vomiting.  Genitourinary: Negative for dysuria and hematuria.  Musculoskeletal: Negative for arthralgias and back pain.  Skin: Negative for color change and rash.  Neurological: Positive for dizziness. Negative for seizures and syncope.  All other systems reviewed and are negative.    Physical Exam Updated Vital Signs BP (!) 150/90  Pulse 78   Temp 97.9 F (36.6 C) (Oral)   Resp 18   SpO2 98%   Physical Exam Vitals signs and nursing note reviewed.  Constitutional:      General: She is not in acute distress.    Appearance: She is well-developed.  HENT:     Head: Normocephalic and atraumatic.     Mouth/Throat:     Pharynx: No oropharyngeal exudate.  Eyes:     Pupils: Pupils are equal, round, and reactive to light.  Neck:     Musculoskeletal: Normal range of motion.  Cardiovascular:     Rate and Rhythm: Regular rhythm.     Heart sounds: Normal heart sounds.  Pulmonary:     Effort: Pulmonary effort is normal. No respiratory distress.     Breath sounds: Normal breath sounds.     Comments: Lungs sounds are diminished to auscultation. Abdominal:     General: Bowel sounds are normal. There is no distension.     Palpations: Abdomen is soft.     Tenderness: There is generalized abdominal tenderness.  Musculoskeletal:        General: No tenderness or deformity.     Right lower leg: No  edema.     Left lower leg: No edema.  Skin:    General: Skin is warm and dry.  Neurological:     Mental Status: She is alert and oriented to person, place, and time.     Comments: Alert, oriented, thought content appropriate. Speech fluent without evidence of aphasia. Able to follow 2 step commands without difficulty.  Cranial Nerves:  II:  Peripheral visual fields grossly normal, pupils, round, reactive to light III,IV, VI: ptosis not present, extra-ocular motions intact bilaterally  V,VII: smile symmetric, facial light touch sensation equal VIII: hearing grossly normal bilaterally  IX,X: midline uvula rise  XI: bilateral shoulder shrug equal and strong XII: midline tongue extension  Motor:  5/5 in upper and lower extremities bilaterally including strong and equal grip strength and dorsiflexion/plantar flexion Sensory: light touch normal in all extremities.  Cerebellar: normal finger-to-nose with bilateral upper extremities, pronator drift negative        ED Treatments / Results  Labs (all labs ordered are listed, but only abnormal results are displayed) Labs Reviewed  COMPREHENSIVE METABOLIC PANEL - Abnormal; Notable for the following components:      Result Value   Sodium 134 (*)    Glucose, Bld 292 (*)    Albumin 3.3 (*)    All other components within normal limits  CBC - Abnormal; Notable for the following components:   RBC 5.20 (*)    All other components within normal limits  URINALYSIS, ROUTINE W REFLEX MICROSCOPIC - Abnormal; Notable for the following components:   APPearance HAZY (*)    Nitrite POSITIVE (*)    Leukocytes,Ua LARGE (*)    WBC, UA >50 (*)    Bacteria, UA MANY (*)    All other components within normal limits  CBG MONITORING, ED - Abnormal; Notable for the following components:   Glucose-Capillary 266 (*)    All other components within normal limits  LIPASE, BLOOD  I-STAT BETA HCG BLOOD, ED ([facility], [facility], [facility] ONLY)    EKG None  Radiology Ct  Abdomen Pelvis Wo Contrast  Result Date: 03/18/2018 CLINICAL DATA:  Generalized abdominal pain for 1 week. Renal disorder. History kidney stones. Nausea and vomiting. EXAM: CT ABDOMEN AND PELVIS WITHOUT CONTRAST TECHNIQUE: Multidetector CT imaging of the abdomen and pelvis was performed  following the standard protocol without IV contrast. COMPARISON:  01/19/2011 FINDINGS: Lower chest: Mild bronchiectasis involving the left lower lobe with presumably post infectious or inflammatory scarring, including on image 25/5. Normal heart size without pericardial or pleural effusion. Tiny hiatal hernia. Hepatobiliary: Mild hepatomegaly at 17.9 cm craniocaudal. Cholecystectomy, without biliary ductal dilatation. Pancreas: Normal, without mass or ductal dilatation. Spleen: Normal in size, without focal abnormality. Adrenals/Urinary Tract: Normal adrenal glands. No renal calculi or hydronephrosis. No hydroureter or ureteric calculi. No bladder calculi. Stomach/Bowel: Normal remainder of the stomach. Normal colon, appendix, and terminal ileum. Normal small bowel. Vascular/Lymphatic: Normal caliber of the aorta and branch vessels. No abdominopelvic adenopathy. Reproductive: Uterine soft tissue fullness and calcifications likely relate to underlying dystrophic fibroids. No adnexal mass. Other: No significant free fluid. A fat containing ventral abdominal wall hernia, including on image 43/3. Musculoskeletal: Disc bulge at the thoracolumbar junction. IMPRESSION: 1. No urinary tract calculi or hydronephrosis. 2. No other explanation for patient's symptoms. 3. Tiny hiatal hernia. 4. Small fat containing ventral abdominal wall hernia. 5. Probable uterine fibroids. Electronically Signed   By: Abigail Miyamoto M.D.   On: 03/18/2018 19:06   Dg Chest 2 View  Result Date: 03/18/2018 CLINICAL DATA:  Shortness of breath EXAM: CHEST - 2 VIEW COMPARISON:  03/23/2013 FINDINGS: The heart size and mediastinal contours are within normal limits.  Both lungs are clear. The visualized skeletal structures are unremarkable. IMPRESSION: No active cardiopulmonary disease. Electronically Signed   By: Ulyses Jarred M.D.   On: 03/18/2018 18:04    Procedures Procedures (including critical care time)  Medications Ordered in ED Medications  sodium chloride flush (NS) 0.9 % injection 3 mL (3 mLs Intravenous Given 03/18/18 1807)  sodium chloride 0.9 % bolus 1,000 mL (0 mLs Intravenous Stopped 03/18/18 1947)  ondansetron (ZOFRAN) injection 4 mg (4 mg Intravenous Given 03/18/18 1946)     Initial Impression / Assessment and Plan / ED Course  I have reviewed the triage vital signs and the nursing notes.  Pertinent labs & imaging results that were available during my care of the patient were reviewed by me and considered in my medical decision making (see chart for details).      Patient with a previous history of diabetes presents to the ED with abdominal pain, nausea, vomiting, multiple episodes of diarrhea.  Denies any recent antibiotic use.  Denies any hospitalization.  Reports she has multiple episodes of diarrhea, nonbloody, with normal appearance.  Has tried multiple medications such as Pepto, Tylenol, Advil, without relieving symptoms.  CMP showed slight decrease in sodium, glucose slightly elevated.  Beta-hCG was negative.  CBC showed no leukocytosis, hemoglobin is within normal limits. Chest x-ray obtained due to patient stating she has had some URI symptoms this past week, no pneumonia, pneumothorax, pleural effusion.  CT abdomen was obtained due to patient having multiple prior surgeries to her abdomen along with her kidney stones.  CT angios showed: 1. No urinary tract calculi or hydronephrosis.  2. No other explanation for patient's symptoms.  3. Tiny hiatal hernia.  4. Small fat containing ventral abdominal wall hernia.  5. Probable uterine fibroids.     UA did not show any ketones, no anion gap, low suspicion for DKA at this time.   Reports some dizziness, suspect this is due to dehydration.  No focal neuro deficit, neurological exam is unremarkable. Patient was provided with a liter of fluids, Zofran for her symptoms.  No episodes of vomiting, diarrhea during her ED visit.  UA was positive for nitrites, large leukocytes, greater than 50 white blood cell count along with many bacteria.  Suspicion the patient is likely suffering from acute cystitis, no back pain, fever low suspicion for any pyelonephritis.  At this time will treat patient with Keflex 7 days therapy.  Patient is afebrile, p.o. challenge was completed with success.  She was given these instructions, return precautions provided at length.  With stable vital signs advised to follow-up with PCP upon completion of the therapy.  Final Clinical Impressions(s) / ED Diagnoses   Final diagnoses:  Acute cystitis without hematuria    ED Discharge Orders         Ordered    cephALEXin (KEFLEX) 500 MG capsule  2 times daily     03/18/18 2014           Janeece Fitting, PA-C 03/18/18 2018    Janeece Fitting, PA-C 03/18/18 2020    Charlesetta Shanks, MD 03/21/18 1440

## 2018-03-18 NOTE — Discharge Instructions (Addendum)
I have provided medication to help treat your urinary tract infection, please take 1 tablet twice a day for the next 7 days.  If you experience any back pain, fever, worsening symptoms please return to the emergency department.  Follow-up with your primary care physician upon completion of antibiotic therapy.

## 2018-03-18 NOTE — ED Triage Notes (Signed)
Pt reports severe generalized abd pain, diarrhea and intermittent vomiting for the past 2 days. Pt also c.o intermittent dizziness. Pt a.o, nad noted.

## 2018-03-18 NOTE — ED Notes (Signed)
Pt tolerating ginger ale 

## 2018-03-19 ENCOUNTER — Encounter: Payer: Self-pay | Admitting: Family Medicine

## 2018-03-19 ENCOUNTER — Telehealth: Payer: Self-pay | Admitting: Family Medicine

## 2018-03-19 NOTE — Telephone Encounter (Signed)
Pt is calling concerning the Diclofenac that was sent to her pharmacy. They said that they do not carry this medication and would like to have a new prescription sent in.  Please call pt when this has been done

## 2018-03-21 ENCOUNTER — Other Ambulatory Visit: Payer: Self-pay | Admitting: Family Medicine

## 2018-03-21 LAB — URINE CULTURE: Culture: >=100000 — AB

## 2018-03-21 MED ORDER — DICLOFENAC SODIUM 1 % TD GEL: 2.0000 g | Freq: Four times a day (QID) | TRANSDERMAL | 2 refills | Status: DC

## 2018-03-21 NOTE — Telephone Encounter (Signed)
I have placed orders for a topical diclofenac gel which they may have available instead of the tablet form.   Please inform patient thanks

## 2018-03-22 ENCOUNTER — Telehealth: Payer: Self-pay | Admitting: Emergency Medicine

## 2018-03-22 NOTE — Telephone Encounter (Signed)
Post ED Visit - Positive Culture Follow-up  Culture report reviewed by antimicrobial stewardship pharmacist: [facility] Team []  Elenor Quinones, Pharm.D. []  Heide Guile, Pharm.D., BCPS AQ-ID []  Parks Neptune, Pharm.D., BCPS []  Alycia Rossetti, Pharm.D., BCPS []  [place], [place].D., BCPS, AAHIVP []  Legrand Como, Pharm.D., BCPS, AAHIVP []  Salome Arnt, PharmD, BCPS []  Johnnette Gourd, PharmD, BCPS []  Hughes Better, PharmD, BCPS []  Leeroy Cha, PharmD [x]  Laqueta Linden, PharmD, BCPS []  Albertina Parr, PharmD  [facility] Team []  Leodis Sias, PharmD []  Lindell Spar, PharmD []  Royetta Asal, PharmD []  Graylin Shiver, Rph []  Rema Fendt) Glennon Mac, PharmD []  Arlyn Dunning, PharmD []  Netta Cedars, PharmD []  Dia Sitter, PharmD []  Leone Haven, PharmD []  Gretta Arab, PharmD []  Theodis Shove, PharmD []  Peggyann Juba, PharmD []  Reuel Boom, PharmD   Positive urine culture Treated with Cephalexin, organism sensitive to the same and no further patient follow-up is required at this time.  [place]arene [place] Lindi Abram 03/22/2018, 11:29 AM

## 2018-03-23 ENCOUNTER — Other Ambulatory Visit: Payer: Self-pay | Admitting: Family Medicine

## 2018-03-23 DIAGNOSIS — J302 Other seasonal allergic rhinitis: Secondary | ICD-10-CM

## 2018-03-23 DIAGNOSIS — J029 Acute pharyngitis, unspecified: Secondary | ICD-10-CM

## 2018-03-23 NOTE — Telephone Encounter (Signed)
Called patient and got clarification concerning RX.  Patient states that her pharmacy does not have the Diclofenac gel or pills.  Patient needs a different medication that is an alternative/ replacement for the previous RX.  Patient states that she really needs this new RX to be sent in to her pharmacy ASAP.  Margaret Larsen, [name]

## 2018-03-23 NOTE — Telephone Encounter (Signed)
Attempted to call patient concerning RX for diclofenac gel.  No answer and no voice mail.  Ozella Almond, [place]

## 2018-03-25 ENCOUNTER — Other Ambulatory Visit: Payer: Self-pay | Admitting: Family Medicine

## 2018-03-25 MED ORDER — GABAPENTIN 300 MG PO CAPS: 300.0000 mg | ORAL_CAPSULE | Freq: Three times a day (TID) | ORAL | 0 refills | Status: DC

## 2018-03-25 NOTE — Telephone Encounter (Signed)
Called patient to clarify her symptoms of leg pain.  She has been on gabapentin before and felt like it has not helped however reports she was on low dose at that time.  She has since discontinued this and is no longer on it.  She is agreeable to restarting it at 300 mg TID to see if it provides any relief.  I have discontinued diclofenac gel and pills.  Rx gabapentin sent to [facility].  Will follow-up with patient at her scheduled appointment on 3/26.

## 2018-04-02 ENCOUNTER — Ambulatory Visit: Payer: Self-pay | Admitting: Family Medicine

## 2018-04-06 ENCOUNTER — Other Ambulatory Visit: Payer: Self-pay | Admitting: Family Medicine

## 2018-04-06 ENCOUNTER — Encounter: Payer: Self-pay | Admitting: Family Medicine

## 2018-04-06 ENCOUNTER — Telehealth (INDEPENDENT_AMBULATORY_CARE_PROVIDER_SITE_OTHER): Payer: Self-pay | Admitting: Family Medicine

## 2018-04-06 DIAGNOSIS — J029 Acute pharyngitis, unspecified: Secondary | ICD-10-CM

## 2018-04-06 DIAGNOSIS — R197 Diarrhea, unspecified: Secondary | ICD-10-CM

## 2018-04-06 DIAGNOSIS — J302 Other seasonal allergic rhinitis: Secondary | ICD-10-CM

## 2018-04-06 NOTE — Assessment & Plan Note (Signed)
Advised that patient use Imodium to help stop diarrhea.  No blood which is reassuring.  No fever.  Advised that she should still check her sugars because if they are elevated she still needs to take her diabetes medications.  Also advised that now that she can eat and drink given that diarrhea will stop secondary to Imodium, she should start her diabetes regimen as normal.  Strict return precautions given.  Stressed the importance of checking blood sugars daily.

## 2018-04-06 NOTE — Telephone Encounter (Signed)
[facility] Telemedicine Visit  Patient consented to have visit conducted via telephone.  Encounter participants: Patient: Margaret Larsen  Provider: Caroline More  Others (if applicable): none  Chief Complaint: diarrhea  HPI: Diarrhea Patient reports that she has had persistent diarrhea x3 days.  States that she has not been able to eat or drink as soon as she eats her diarrhea worsens.  Has been trying to eat things like bread and crackers.  Had recent Keflex on 3/11 and completed this course.  Denies any vomiting or diarrhea.  Because she has not been able to eat or drink properly she has not been taking her diabetes medications as she says since she is not eating she should not have to take insulin.  She has not checking her blood sugars either as she has not been eating or drinking.  From chart review it appears in October her A1c was elevated 10.7.  Medication regimen is Jardiance, Trulicity, Tresiba, NovoLog. Denies fever.   ROS: see HPI, other ROS negative   Pertinent PMHx: Diabetes  Assessment/Plan:  Diarrhea Advised that patient use Imodium to help stop diarrhea.  No blood which is reassuring.  No fever.  Advised that she should still check her sugars because if they are elevated she still needs to take her diabetes medications.  Also advised that now that she can eat and drink given that diarrhea will stop secondary to Imodium, she should start her diabetes regimen as normal.  Strict return precautions given.  Stressed the importance of checking blood sugars daily.   Discussed with Dr. Mingo Amber  Time spent on phone with patient: 15 minutes

## 2018-04-07 ENCOUNTER — Other Ambulatory Visit: Payer: Self-pay

## 2018-04-07 ENCOUNTER — Telehealth: Payer: Self-pay

## 2018-04-07 ENCOUNTER — Telehealth: Payer: Self-pay | Admitting: Family Medicine

## 2018-04-07 NOTE — Telephone Encounter (Signed)
Call patient as she was on schedule for telemedicine this morning.  Patient already had an appointment with Dr. Tammi Klippel and telemedicine clinic yesterday.  She states that she does not have anything else that she would like to talk about today.  She states that she is feeling better from yesterday.

## 2018-04-08 NOTE — Telephone Encounter (Signed)
I was preceptor the day of this visit.   

## 2018-04-27 ENCOUNTER — Other Ambulatory Visit: Payer: Self-pay | Admitting: Family Medicine

## 2018-05-12 ENCOUNTER — Other Ambulatory Visit: Payer: Self-pay | Admitting: Family Medicine

## 2018-05-20 ENCOUNTER — Other Ambulatory Visit: Payer: Self-pay | Admitting: Student in an Organized Health Care Education/Training Program

## 2018-05-20 DIAGNOSIS — J302 Other seasonal allergic rhinitis: Secondary | ICD-10-CM

## 2018-05-20 DIAGNOSIS — J029 Acute pharyngitis, unspecified: Secondary | ICD-10-CM

## 2018-06-04 ENCOUNTER — Other Ambulatory Visit: Payer: Self-pay | Admitting: Family Medicine

## 2018-06-09 ENCOUNTER — Other Ambulatory Visit: Payer: Self-pay

## 2018-06-09 ENCOUNTER — Ambulatory Visit (INDEPENDENT_AMBULATORY_CARE_PROVIDER_SITE_OTHER): Payer: Self-pay | Admitting: Family Medicine

## 2018-06-09 VITALS — BP 118/80 | HR 83 | Temp 98.2°F | Wt 205.0 lb

## 2018-06-09 DIAGNOSIS — E118 Type 2 diabetes mellitus with unspecified complications: Secondary | ICD-10-CM

## 2018-06-09 DIAGNOSIS — IMO0002 Reserved for concepts with insufficient information to code with codable children: Secondary | ICD-10-CM

## 2018-06-09 DIAGNOSIS — E1165 Type 2 diabetes mellitus with hyperglycemia: Secondary | ICD-10-CM

## 2018-06-09 LAB — POCT GLYCOSYLATED HEMOGLOBIN (HGB A1C): HbA1c, POC (controlled diabetic range): 11 % — AB (ref 0.0–7.0)

## 2018-06-09 MED ORDER — GABAPENTIN 300 MG PO CAPS: 600.0000 mg | ORAL_CAPSULE | Freq: Three times a day (TID) | ORAL | 2 refills | Status: DC

## 2018-06-09 NOTE — Progress Notes (Signed)
   Subjective:   Patient ID: Margaret Larsen    DOB: 07/13/1970, 48 y.o. female   MRN: [id number]  CC: neuropathy worse  HPI: Margaret Larsen is a 48 y.o. female who presents to clinic today for the following issue.  Diabetic neuropathy Worsening neuropathy over the last few weeks.  She has not changed anything in her diet or started any new medications. Numbness tingling and pain in her feet bilaterally.   Keeping her up at night and makes it difficult to sleep.  Does not think the dose of her gabapentin is working well.   No fever, chills, nausea, vomiting.  No abdominal pain.   ROS: See HPI for pertinent ROS.  Social: pt is a never smoker  Medications reviewed. Objective:   BP 118/80   Pulse 83   Temp 98.2 F (36.8 C) (Oral)   Wt 205 lb (93 kg)   SpO2 97%   BMI 41.40 kg/m  Vitals and nursing note reviewed.  General: 48 yo female, NAD  Neck: supple CV: RRR no MRG  Lungs: CTAB, normal effort  Abdomen: soft, NTND, +bs  Skin: warm, dry, no rashes or lesions, cap refill < 2 seconds Extremities: warm and well perfused, normal tone Neuro: alert, oriented x3, no focal deficits, grip strength equal bilaterally, normal sensation  Assessment & Plan:   Uncontrolled type 2 diabetes mellitus with complication Worsening neuropathy in setting of uncontrolled blood sugars.  Hgb A1c 11.0 today, down from 12.3 in March.   Takes gabapentin 300 mg TID, has some room to go up on this as 800 mg TID is max.  Would advise increasing gabapentin to 600 mg TID.  Counseled on diet and exercise  Return precautions given   Orders Placed This Encounter  Procedures  . HgB A1c   Meds ordered this encounter  Medications  . DISCONTD: gabapentin (NEURONTIN) 300 MG capsule    Sig: Take 2 capsules (600 mg total) by mouth 3 (three) times daily.    Dispense:  90 capsule    Refill:  2  . gabapentin (NEURONTIN) 300 MG capsule    Sig: Take 2 capsules (600 mg total) by mouth 3 (three) times  daily.    Dispense:  180 capsule    Refill:  2   Lovenia Kim, MD [facility], PGY-3 06/14/2018 4:32 PM

## 2018-06-14 NOTE — Assessment & Plan Note (Signed)
Worsening neuropathy in setting of uncontrolled blood sugars.  Hgb A1c 11.0 today, down from 12.3 in March.   Takes gabapentin 300 mg TID, has some room to go up on this as 800 mg TID is max.  Would advise increasing gabapentin to 600 mg TID.  Counseled on diet and exercise  Return precautions given

## 2018-07-07 ENCOUNTER — Other Ambulatory Visit: Payer: Self-pay | Admitting: Student in an Organized Health Care Education/Training Program

## 2018-07-13 ENCOUNTER — Other Ambulatory Visit: Payer: Self-pay | Admitting: *Deleted

## 2018-07-13 DIAGNOSIS — J029 Acute pharyngitis, unspecified: Secondary | ICD-10-CM

## 2018-07-13 MED ORDER — CETIRIZINE HCL 10 MG PO TABS: 10.0000 mg | ORAL_TABLET | Freq: Every day | ORAL | 12 refills | Status: DC

## 2018-07-20 ENCOUNTER — Other Ambulatory Visit: Payer: Self-pay

## 2018-07-20 ENCOUNTER — Telehealth (INDEPENDENT_AMBULATORY_CARE_PROVIDER_SITE_OTHER): Payer: Self-pay | Admitting: Family Medicine

## 2018-07-20 ENCOUNTER — Telehealth: Payer: Self-pay | Admitting: Family Medicine

## 2018-07-20 ENCOUNTER — Ambulatory Visit (HOSPITAL_COMMUNITY)
Admission: EM | Admit: 2018-07-20 | Discharge: 2018-07-20 | Disposition: A | Discharge status: Home / Self Care | Payer: Self-pay | Attending: Urgent Care | Admitting: Urgent Care

## 2018-07-20 ENCOUNTER — Ambulatory Visit: Payer: Self-pay

## 2018-07-20 ENCOUNTER — Encounter (HOSPITAL_COMMUNITY): Payer: Self-pay

## 2018-07-20 DIAGNOSIS — R05 Cough: Secondary | ICD-10-CM

## 2018-07-20 DIAGNOSIS — J302 Other seasonal allergic rhinitis: Secondary | ICD-10-CM

## 2018-07-20 DIAGNOSIS — J069 Acute upper respiratory infection, unspecified: Secondary | ICD-10-CM

## 2018-07-20 DIAGNOSIS — J9801 Acute bronchospasm: Secondary | ICD-10-CM

## 2018-07-20 DIAGNOSIS — Z9109 Other allergy status, other than to drugs and biological substances: Secondary | ICD-10-CM

## 2018-07-20 DIAGNOSIS — R0602 Shortness of breath: Secondary | ICD-10-CM

## 2018-07-20 DIAGNOSIS — Z20822 Contact with and (suspected) exposure to covid-19: Secondary | ICD-10-CM

## 2018-07-20 DIAGNOSIS — R059 Cough, unspecified: Secondary | ICD-10-CM

## 2018-07-20 DIAGNOSIS — U071 COVID-19: Secondary | ICD-10-CM

## 2018-07-20 MED ORDER — MONTELUKAST SODIUM 10 MG PO TABS: 10.0000 mg | ORAL_TABLET | Freq: Every day | ORAL | 0 refills | Status: DC

## 2018-07-20 NOTE — Telephone Encounter (Signed)
Pt would like to have a note written stating that with her health conditions and being on diabetes medication it is not safe to work in the sun and heat.   Pt was also tested for COVID today. I informed her that she would not be able to come into the office to pick up a note until she receives her results. 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 Higinio Plan to call her asap to discuss.

## 2018-07-20 NOTE — ED Provider Notes (Addendum)
MRN: [id number] DOB: [birth year]  Subjective:   Margaret Larsen is a 48 y.o. female presenting for 1 week history of mostly dry cough that elicits shob. Has a history of allergies, takes Flonase daily. Denies hx of asthma but has a difficult time with her breathing in the summer. Has usually gotten an inhaler for this with good relief. Denies smoking cigarettes. Patient works at [employer] and has a difficult time when they require her to work outdoors.  She states that she reached out to her PCP for a work note regarding her chronic conditions and working outdoors but she was advised by family medicine to come over here.  No current facility-administered medications for this encounter.   Current Outpatient Medications:  .  ACCU-CHEK FASTCLIX LANCETS MISC, 1 each by Does not apply route 3 (three) times daily. ICD 9 250.92, Disp: 102 each, Rfl: 11 .  albuterol (PROVENTIL HFA;VENTOLIN HFA) 108 (90 Base) MCG/ACT inhaler, Inhale 2 puffs into the lungs every 6 (six) hours as needed for wheezing., Disp: 1 Inhaler, Rfl: 3 .  aspirin 81 MG EC tablet, Take 81 mg by mouth daily. Reported on 05/19/2015, Disp: , Rfl:  .  Blood Glucose Monitoring Suppl (AGAMATRIX PRESTO PRO METER) DEVI, 1 Device by Does not apply route once., Disp: 1 Device, Rfl: 0 .  cetirizine (ZYRTEC) 10 MG tablet, Take 1 tablet (10 mg total) by mouth daily., Disp: 30 tablet, Rfl: 12 .  diclofenac (CATAFLAM) 50 MG tablet, Take 1 tablet (50 mg total) by mouth 2 (two) times daily as needed., Disp: 20 tablet, Rfl: 0 .  diclofenac sodium (VOLTAREN) 1 % GEL, Apply 2 g topically 4 (four) times daily., Disp: 100 g, Rfl: 2 .  empagliflozin (JARDIANCE) 10 MG TABS tablet, Take 10 mg by mouth daily., Disp: 30 tablet, Rfl: 0 .  EPINEPHrine (EPI-PEN) 0.3 mg/0.3 mL DEVI, Inject 0.3 mLs (0.3 mg total) into the muscle as needed. For allergic reaction (Patient not taking: Reported on 11/02/2015), Disp: 1 Device, Rfl: 0 .  gabapentin (NEURONTIN) 300 MG  capsule, Take 2 capsules (600 mg total) by mouth 3 (three) times daily., Disp: 180 capsule, Rfl: 2 .  glucose blood (AGAMATRIX PRESTO TEST) test strip, Use as instructed, Disp: 50 each, Rfl: prn .  hydrocortisone 2.5 % ointment, Apply topically 2 (two) times daily. As needed for mild eczema.  Do not use for more than 1-2 weeks at a time., Disp: 30 g, Rfl: 3 .  hydrOXYzine (ATARAX/VISTARIL) 10 MG tablet, Take 1 tablet (10 mg total) by mouth 2 (two) times daily as needed., Disp: 30 tablet, Rfl: 0 .  insulin aspart (NOVOLOG) 100 UNIT/ML FlexPen, 40 units with largest meal, 10 units with 2nd largest, Disp: 15 mL, Rfl: 11 .  Insulin Degludec (TRESIBA FLEXTOUCH) 200 UNIT/ML SOPN, Inject 86 Units into the skin every morning., Disp: 3 pen, Rfl: 3 .  Insulin Syringe-Needle U-100 (INSULIN SYRINGE .3CC/31GX5/16") 31G X 5/16" 0.3 ML MISC, Use 3 needles per day, Disp: 100 each, Rfl: 3 .  NASONEX 50 MCG/ACT nasal spray, PLACE 2 SPRAYS INTO THE NOSE DAILY., Disp: 17 g, Rfl: 0 .  omeprazole (PRILOSEC) 20 MG capsule, Take 1 capsule (20 mg total) by mouth daily., Disp: 30 capsule, Rfl: 1 .  pravastatin (PRAVACHOL) 80 MG tablet, Take 1 tablet (80 mg total) by mouth daily., Disp: 90 tablet, Rfl: 3 .  TRESIBA FLEXTOUCH 100 UNIT/ML SOPN FlexTouch Pen, INJECT 80 UNITS INTO THE SKIN EVERY MORNING., Disp: 12 mL, Rfl: 0 .  TRULICITY 1.5 MI/6.8EH SOPN, INJECT 1.5MG  INTO THE SKIN EVERY MONDAY., Disp: 0.5 pen, Rfl: 3   Allergies  Allergen Reactions  . Metformin And Related Hives and Diarrhea    Unable to tolerate  . Peanut-Containing Drug Products Shortness Of Breath and Swelling    mouth swelling  . Codeine Other (See Comments)    Patient stated that it slowed her heart rate, shortness of breath  . Diphenhydramine Hcl Hives and Rash  . Fexofenadine-Pseudoephed Er Other (See Comments)    Patient stated that is slowed her heart rate  . Latuda [Lurasidone Hcl] Other (See Comments)    Myalgias all over, most notably in her  arms and legs.  Marland Kitchen Shellfish Allergy Itching    Past Medical History:  Diagnosis Date  . Diabetes mellitus   . Hemorrhoids 07/10/2010  . Hypertension   . Kidney stones   . Kidney stones   . Renal disorder      Past Surgical History:  Procedure Laterality Date  . CESAREAN SECTION    . CHOLECYSTECTOMY    . fiborids removed    . TUBAL LIGATION      Review of Systems  Constitutional: Negative for fever and malaise/fatigue.  HENT: Positive for congestion. Negative for ear pain, sinus pain and sore throat.        Tingling of both her ears.  Eyes: Negative for blurred vision, double vision, discharge and redness.  Respiratory: Positive for cough and shortness of breath. Negative for hemoptysis and wheezing.   Cardiovascular: Negative for chest pain.  Gastrointestinal: Negative for abdominal pain, diarrhea, nausea and vomiting.  Genitourinary: Negative for dysuria, flank pain and hematuria.  Musculoskeletal: Negative for myalgias.  Skin: Negative for rash.  Neurological: Negative for dizziness, weakness and headaches.  Psychiatric/Behavioral: Negative for depression and substance abuse.    Objective:   Vitals: BP (!) 145/83 (BP Location: Right Arm)   Pulse 89   Temp 98.5 F (36.9 C) (Oral)   Resp 16   SpO2 97%   Physical Exam Constitutional:      General: She is not in acute distress.    Appearance: Normal appearance. She is well-developed. She is not ill-appearing, toxic-appearing or diaphoretic.  HENT:     Head: Normocephalic and atraumatic.     Right Ear: Tympanic membrane and ear canal normal. No drainage or tenderness. No middle ear effusion. Tympanic membrane is not erythematous.     Left Ear: Tympanic membrane and ear canal normal. No drainage or tenderness.  No middle ear effusion. Tympanic membrane is not erythematous.     Nose: Nose normal. No congestion or rhinorrhea.     Mouth/Throat:     Mouth: Mucous membranes are moist. No oral lesions.     Pharynx:  Oropharynx is clear. No pharyngeal swelling, oropharyngeal exudate, posterior oropharyngeal erythema or uvula swelling.     Tonsils: No tonsillar exudate or tonsillar abscesses.  Eyes:     Extraocular Movements: Extraocular movements intact.     Right eye: Normal extraocular motion.     Left eye: Normal extraocular motion.     Conjunctiva/sclera: Conjunctivae normal.     Pupils: Pupils are equal, round, and reactive to light.  Neck:     Musculoskeletal: Normal range of motion and neck supple.  Cardiovascular:     Rate and Rhythm: Normal rate and regular rhythm.     Pulses: Normal pulses.     Heart sounds: Normal heart sounds. No murmur. No friction rub. No gallop.   Pulmonary:  Effort: Pulmonary effort is normal. No respiratory distress.     Breath sounds: Normal breath sounds. No stridor. No wheezing, rhonchi or rales.  Lymphadenopathy:     Cervical: No cervical adenopathy.  Skin:    General: Skin is warm and dry.     Findings: No rash.  Neurological:     General: No focal deficit present.     Mental Status: She is alert and oriented to person, place, and time.  Psychiatric:        Mood and Affect: Mood normal.        Behavior: Behavior normal.        Thought Content: Thought content normal.     Assessment and Plan :   1. Cough   2. Shortness of breath   3. Environmental allergies   4. Bronchospasm   5. Seasonal allergies     Patient has an albuterol inhaler that she has not started to use, declined refill.  I recommended that she start to use this especially when working once every 4-6 hours as needed.  Patient is to maintain her allergy medication.  I did start her on Singulair to help with this as well as well as her bronchospasms which may be likely due to allergies or the content of the air.  Recommend that she return to family medicine to obtain a work note for her chronic conditions. Counseled patient on potential for adverse effects with medications  prescribed/recommended today, ER and return-to-clinic precautions discussed, patient verbalized understanding.  COVID testing is pending.     Jaynee Eagles, PA-C 07/20/18 1152

## 2018-07-20 NOTE — Progress Notes (Signed)
[facility] Tele[facility] Visit  Patient consented to have virtual visit. Method of visit: Telephone  Encounter participants: Patient: Margaret Larsen - located at home Provider: Bonnita Hollow - located at office Others (if applicable): no  Chief Complaint: SOB  HPI:  Patient has had 1 week, feeling hot with cough and sore throat, diarrhea and shortness of breath and vomiting.  Initially she was concerned about her allergies.  She has been taking allergy medicine: Zyrtec and Flonase as well as Robitussin.  None of it has been working.  Says that she has been feeling clammy.  Denies any sick contacts.  Denies feeling like this before.  Says she feels fatigued.  Denies exposure to new animals.  Patient cough triggered by standing, walking short distances. ROS: per HPI  Pertinent PMHx:   Exam:  Respiratory: able to speak in full sentence   Assessment/Plan: Given symptoms of URI type symptoms with also shortness of breath plus vomiting plus diarrhea.  Concerned about respiratory status and hydration status.  Patient is to be evaluated in person.  Also needs to be tested for possible COVID-19.  Recommend patient be sent to urgent care or ED for evaluation.  Patient says that she will drive her self to urgent care at [place].  Time spent during visit with patient: 15 minutes

## 2018-07-20 NOTE — ED Triage Notes (Signed)
Patient presents to Urgent Care with complaints of cough and shortness of breath since last week. Patient reports her symptoms started with a sore throat but that has since gone away.

## 2018-07-21 ENCOUNTER — Telehealth: Payer: Self-pay | Admitting: *Deleted

## 2018-07-21 NOTE — Telephone Encounter (Signed)
Lm for patient to return call to schedule covid testing [phone] M-F 7a-7p. Order previously placed

## 2018-07-21 NOTE — Telephone Encounter (Signed)
-----   Message from Jaynee Eagles, [place] sent at 07/20/2018 11:38 AM EDT ----- Regarding: Needs COVID testing

## 2018-07-21 NOTE — Telephone Encounter (Signed)
Pt. Called back, reports she went to a site yesterday and had her test.

## 2018-07-22 ENCOUNTER — Other Ambulatory Visit: Payer: Self-pay

## 2018-07-22 DIAGNOSIS — E1165 Type 2 diabetes mellitus with hyperglycemia: Secondary | ICD-10-CM

## 2018-07-22 DIAGNOSIS — J029 Acute pharyngitis, unspecified: Secondary | ICD-10-CM

## 2018-07-22 DIAGNOSIS — IMO0002 Reserved for concepts with insufficient information to code with codable children: Secondary | ICD-10-CM

## 2018-07-22 DIAGNOSIS — J302 Other seasonal allergic rhinitis: Secondary | ICD-10-CM

## 2018-07-22 MED ORDER — MOMETASONE FUROATE 50 MCG/ACT NA SUSP: NASAL | 0 refills | Status: DC

## 2018-07-22 MED ORDER — INSULIN ASPART 100 UNIT/ML FLEXPEN: PEN_INJECTOR | SUBCUTANEOUS | 11 refills | Status: DC

## 2018-07-22 NOTE — Telephone Encounter (Signed)
Called patient to discuss her concern.  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  States she will be outside 6+ hours with no shade, endorses she is not allowed to go back inside. 90+ temp days. Says she is not allowed to wear a hat. After each of these shifts, she has felt exhausted, short of breath, and vomited on several occasions.  Due to being in the heat she has also not had an appetite, thus not taking several of her diabetic medications.  A1c 11 in 06/2018.  She has been drinking plenty of water during her shift, but always feels dehydrated.  Feels her normal self outside of days when she has the shifts.  States there are other jobs that she could do if she no longer worked the outside shift, she is already tried doing these shifts for the past few months and feels like she cannot do it anymore.   Concern for heat exhaustion and worsening of her diabetes.  Understand the difficulty this is causing for the patient, will write a letter requesting inside shifts if possible.  [name] son, Oley Balm, will be picking up her letter on Friday morning as she currently has a pending COVID test because of troublesome symptoms a few days ago.  Patient endorses he has not had recent exposure to her.   Additionally discussed her poorly controlled diabetes, recommended she continue taking her insulin and diabetic regimen as prescribed.  She should follow-up with the next month for diabetes check.  Encourage staying well-hydrated, having a mini fan, sunscreen, and comfortable shoes when standing on her feet.  Patriciaann Clan, DO

## 2018-07-24 ENCOUNTER — Encounter: Payer: Self-pay | Admitting: Family Medicine

## 2018-07-24 NOTE — Telephone Encounter (Signed)
Completed letter, placed in the front office for pickup.

## 2018-08-03 NOTE — Telephone Encounter (Signed)
Received fax from pharmacy.  32ml of tresiba is only a 15 day supply.  Can we send for 36ml?   To PCP. Christen Bame, CMA

## 2018-08-04 ENCOUNTER — Other Ambulatory Visit: Payer: Self-pay | Admitting: Family Medicine

## 2018-08-04 DIAGNOSIS — E1165 Type 2 diabetes mellitus with hyperglycemia: Secondary | ICD-10-CM

## 2018-08-04 DIAGNOSIS — IMO0002 Reserved for concepts with insufficient information to code with codable children: Secondary | ICD-10-CM

## 2018-08-04 MED ORDER — TRESIBA FLEXTOUCH 200 UNIT/ML ~~LOC~~ SOPN: 86.0000 [IU] | PEN_INJECTOR | Freq: Every morning | SUBCUTANEOUS | 3 refills | Status: DC

## 2018-08-04 NOTE — Telephone Encounter (Signed)
Sent in [place] refill with 8 pens at a time (24 mL) for 3 refills to [facility].  Thank you, Dr Higinio Plan

## 2018-08-04 NOTE — Telephone Encounter (Signed)
Informed patient.  .Margaret Larsen Margaret Larsen, CMA  

## 2018-08-10 ENCOUNTER — Other Ambulatory Visit: Payer: Self-pay

## 2018-08-10 DIAGNOSIS — J029 Acute pharyngitis, unspecified: Secondary | ICD-10-CM

## 2018-08-10 DIAGNOSIS — J302 Other seasonal allergic rhinitis: Secondary | ICD-10-CM

## 2018-08-21 ENCOUNTER — Other Ambulatory Visit: Payer: Self-pay | Admitting: Family Medicine

## 2018-08-21 ENCOUNTER — Other Ambulatory Visit: Payer: Self-pay

## 2018-08-21 DIAGNOSIS — J029 Acute pharyngitis, unspecified: Secondary | ICD-10-CM

## 2018-08-21 DIAGNOSIS — IMO0002 Reserved for concepts with insufficient information to code with codable children: Secondary | ICD-10-CM

## 2018-08-21 DIAGNOSIS — E1165 Type 2 diabetes mellitus with hyperglycemia: Secondary | ICD-10-CM

## 2018-08-21 DIAGNOSIS — J302 Other seasonal allergic rhinitis: Secondary | ICD-10-CM

## 2018-08-21 MED ORDER — INSULIN ASPART 100 UNIT/ML FLEXPEN: PEN_INJECTOR | SUBCUTANEOUS | 5 refills | Status: DC

## 2018-08-21 MED ORDER — MOMETASONE FUROATE 50 MCG/ACT NA SUSP: NASAL | 0 refills | Status: DC

## 2018-08-21 MED ORDER — TRULICITY 1.5 MG/0.5ML ~~LOC~~ SOAJ: SUBCUTANEOUS | 2 refills | Status: DC

## 2018-08-21 NOTE — Progress Notes (Signed)
Initially refilled Trulicity, however did not have pharmacy in place and printed rather than electronically sent.  Resending electronically.  Patriciaann Clan, DO

## 2018-09-30 ENCOUNTER — Other Ambulatory Visit: Payer: Self-pay

## 2018-09-30 DIAGNOSIS — J302 Other seasonal allergic rhinitis: Secondary | ICD-10-CM

## 2018-10-01 MED ORDER — ALBUTEROL SULFATE HFA 108 (90 BASE) MCG/ACT IN AERS: 2.0000 | INHALATION_SPRAY | Freq: Four times a day (QID) | RESPIRATORY_TRACT | 0 refills | Status: DC | PRN

## 2018-10-01 NOTE — Telephone Encounter (Signed)
Please call the patient to see if she can schedule a follow up for her diabetes and hypertension at her convenience. Thank you!   Best,  Dr Higinio Plan

## 2018-10-05 ENCOUNTER — Other Ambulatory Visit: Payer: Self-pay | Admitting: Family Medicine

## 2018-10-05 DIAGNOSIS — J029 Acute pharyngitis, unspecified: Secondary | ICD-10-CM

## 2018-10-05 DIAGNOSIS — J302 Other seasonal allergic rhinitis: Secondary | ICD-10-CM

## 2018-10-11 ENCOUNTER — Telehealth: Payer: Self-pay

## 2018-10-26 ENCOUNTER — Telehealth: Payer: Self-pay | Admitting: Family Medicine

## 2018-10-26 ENCOUNTER — Other Ambulatory Visit: Payer: Self-pay | Admitting: Family Medicine

## 2018-10-26 MED ORDER — FLUCONAZOLE 150 MG PO TABS: 150.0000 mg | ORAL_TABLET | Freq: Every day | ORAL | 0 refills | Status: DC

## 2018-10-26 NOTE — Telephone Encounter (Signed)
Patient needs a refill on her Diflucan, (for yeast infection), which she says only happens when her blood sugar is high.  Please send to [facility] on [address].  Please let her know if we can fill this, [phone].

## 2018-10-26 NOTE — Telephone Encounter (Signed)
I will send in [place], but please also have patient schedule a diabetes follow up as her glucose continues to be uncontrolled (last seen in 06/2018 and A1c 11). Thank you!   Patriciaann Clan, DO

## 2018-10-26 NOTE — Telephone Encounter (Signed)
Patient calling to check on this refill. Told patient is was called in to [facility] on [address] and also gave her [employer]. Patient states "well its not my fault I haven't been in, every time I have called to make an appointment I've been told y'all aren't seeing people because of [place]." Told patient we have been seeing patients for months now so she wouldn't of had a problem getting an appointment.  Made her an appt for 11/06/2018 @ 9:45 and told her she would need to come to this appt for future refills.

## 2018-11-02 ENCOUNTER — Encounter: Payer: Self-pay | Admitting: Family Medicine

## 2018-11-06 ENCOUNTER — Ambulatory Visit: Payer: Self-pay | Admitting: Family Medicine

## 2018-11-30 ENCOUNTER — Other Ambulatory Visit: Payer: Self-pay | Admitting: Family Medicine

## 2018-11-30 DIAGNOSIS — J029 Acute pharyngitis, unspecified: Secondary | ICD-10-CM

## 2018-11-30 DIAGNOSIS — J302 Other seasonal allergic rhinitis: Secondary | ICD-10-CM

## 2018-12-07 NOTE — Progress Notes (Signed)
Subjective:    Patient ID: Margaret Larsen, female    DOB: 1971-01-02, 48 y.o.   MRN: [id number]   CC: diabetes & LUE edema  HPI: Diabetes Fasting checks: 100-200, the other day was in 500 (ate something the day before that made sugars rise) Post prandial does not check   Compliance: good but did not pick up jardiance  Diet: only eats once a day, "just grabs something", doesn't really eat rice, has something sweet every once in a while   Exercise: walks daily  Eye exam: due, does not have insurance so unable to go this year  Foot exam: will perform today A1C: 10.2 Symptoms: no symptoms of hypoglycemia. some symptoms of  polyuria, polydipsia. yes numbness in extremities, and no foot ulcers/trauma Meds: trulicity 1.5, jardiance 10mg , novolog 40U with largest meal & 10U with next largest meal, tresiba 86U Pneumonia vaccine: UTD  Monitoring Labs and Parameters Last A1C:  Lab Results  Component Value Date   HGBA1C 10.2 (A) 12/08/2018   Last Lipid:     Component Value Date/Time   CHOL 216 (H) 06/25/2017 0935   HDL 45 06/25/2017 0935   Last Bmet  Potassium  Date Value Ref Range Status  03/18/2018 4.1 3.5 - 5.1 mmol/L Final   Sodium  Date Value Ref Range Status  03/18/2018 134 (L) 135 - 145 mmol/L Final  06/25/2017 140 134 - 144 mmol/L Final   Creat  Date Value Ref Range Status  05/19/2015 0.54 0.50 - 1.10 mg/dL Final   Creatinine, Ser  Date Value Ref Range Status  03/18/2018 0.58 0.44 - 1.00 mg/dL Final      LUE swelling During physical exam patient noted that she had left upper extremity edema.  Is unclear when this started.  Does not really bother her that much.  Does hurt if she pushes against it.  Is not sure if it came up today versus earlier than today.   Objective:  BP 140/80   Pulse 77   Wt 204 lb (92.5 kg)   SpO2 97%   BMI 41.20 kg/m  Vitals and nursing note reviewed  General: well nourished, in no acute distress HEENT: normocephalic Neck:  supple, non-tender, without lymphadenopathy Cardiac: RRR, clear S1 and S2, no murmurs, rubs, or gallops Respiratory: clear to auscultation bilaterally, no increased work of breathing Abdomen: soft, nontender, nondistended, no masses or organomegaly. Bowel sounds present Extremities: Edema to left upper extremity forearm.  Some erythema as well.  Some tenderness with deep palpation.. Warm, well perfused.  Skin: warm and dry, no rashes noted Neuro: alert and oriented, no focal deficits Foot: No deformities, no ulcerations, no other skin breakdown bilaterally, not intact to touch and monofilament testing bilaterally, PT and DP pulses intact bilaterally  Assessment & Plan:    Uncontrolled type 2 diabetes mellitus with complication She does not seem to be 110 compliant as she did not get Jardiance from the health department.  Is on Trulicity, NovoLog, Tresiba.  I will resend Jardiance 10 mg to the health department.  Hopefully with taking that she can be under better control.  We will also send referral to CCM to help with care coordination.  Patient does not have insurance that has difficulty affording medication so they may be able to assist with this.  Pharmacy team may also be able to help this patient has a history of polypharmacy and they can help with this.  They can also help with diabetes management as patient is on  multiple medications and is compliant yet her A1c remains significantly elevated.  I advised that patient will receive a call from them and she is very appreciative of this consult.  Strict return precautions given.  Offered referral to ophthalmology but patient refused due to lack of insurance.  Follow-up in 1 month.  Left upper extremity swelling Patient with left upper extremity edema.  Unclear etiology.  Can consider DVT given that there is new edema as well as erythema.  Some pain with deep palpation as well.  I advised that she get an ultrasound.  Unfortunately due to the time  of the appointment we were unable to schedule this while she was here.  I advised that she go to the emergency room to have this evaluated.  Patient refused.  I gave her the risks as including worsening of clot as well as pulmonary embolism.  She is to continue to refuse ED.  We will plan to call patient first thing in the morning to have an appointment time to get her ultrasound tomorrow.  CMA Sharyn Lull will call patient in the morning to have this scheduled.  Discussed with Dr. Gwendlyn Deutscher who also came and evaluated patient.  Agrees with plan.    Return in about 1 month (around 01/08/2019).   Caroline More, DO, PGY-3

## 2018-12-08 ENCOUNTER — Ambulatory Visit (INDEPENDENT_AMBULATORY_CARE_PROVIDER_SITE_OTHER): Payer: Self-pay | Admitting: Family Medicine

## 2018-12-08 ENCOUNTER — Other Ambulatory Visit: Payer: Self-pay

## 2018-12-08 VITALS — BP 140/80 | HR 77 | Wt 204.0 lb

## 2018-12-08 DIAGNOSIS — M7989 Other specified soft tissue disorders: Secondary | ICD-10-CM | POA: Insufficient documentation

## 2018-12-08 DIAGNOSIS — IMO0002 Reserved for concepts with insufficient information to code with codable children: Secondary | ICD-10-CM

## 2018-12-08 DIAGNOSIS — E1165 Type 2 diabetes mellitus with hyperglycemia: Secondary | ICD-10-CM

## 2018-12-08 DIAGNOSIS — E118 Type 2 diabetes mellitus with unspecified complications: Secondary | ICD-10-CM

## 2018-12-08 LAB — POCT GLYCOSYLATED HEMOGLOBIN (HGB A1C): Hemoglobin A1C: 10.2 % — AB (ref 4.0–5.6)

## 2018-12-08 MED ORDER — EMPAGLIFLOZIN 10 MG PO TABS: 10.0000 mg | ORAL_TABLET | Freq: Every day | ORAL | 0 refills | Status: DC

## 2018-12-08 NOTE — Assessment & Plan Note (Addendum)
She does not seem to be 110 compliant as she did not get Jardiance from the health department.  Is on Trulicity, NovoLog, Tresiba.  I will resend Jardiance 10 mg to the health department.  Hopefully with taking that she can be under better control.  We will also send referral to CCM to help with care coordination.  Patient does not have insurance that has difficulty affording medication so they may be able to assist with this.  Pharmacy team may also be able to help this patient has a history of polypharmacy and they can help with this.  They can also help with diabetes management as patient is on multiple medications and is compliant yet her A1c remains significantly elevated.  I advised that patient will receive a call from them and she is very appreciative of this consult.  Strict return precautions given.  Offered referral to ophthalmology but patient refused due to lack of insurance.  Follow-up in 1 month.

## 2018-12-08 NOTE — Patient Instructions (Addendum)
  Diet Recommendations for Diabetes   Starchy (carb) foods: Bread, rice, pasta, potatoes, corn, cereal, grits, crackers, bagels, muffins, all baked goods.  (Fruits, milk, and yogurt also have carbohydrate, but most of these foods will not spike your blood sugar as the starchy foods will.)  A few fruits do cause high blood sugars; use small portions of bananas (limit to 1/2 at a time), grapes, watermelon, oranges, and most tropical fruits.    Protein foods: Meat, fish, poultry, eggs, dairy foods, and beans such as pinto and kidney beans (beans also provide carbohydrate).   1. Eat at least 3 meals and 1-2 snacks per day. Never go more than 4-5 hours while awake without eating. Eat breakfast within the first hour of getting up.   2. Limit starchy foods to TWO per meal and ONE per snack. ONE portion of a starchy  food is equal to the following:   - ONE slice of bread (or its equivalent, such as half of a hamburger bun).   - 1/2 cup of a "scoopable" starchy food such as potatoes or rice.   - 15 grams of carbohydrate as shown on food label.  3. Include at every meal: a protein food, a carb food, and vegetables and/or fruit.   - Obtain twice the volume of veg's as protein or carbohydrate foods for both lunch and dinner.   - Fresh or frozen veg's are best.   - Keep frozen veg's on hand for a quick vegetable serving.       We will be getting a call from the chronic care management team to help discuss the cause of medications as well as helping control your diabetes.  For your diabetes I have sent in Jardiance 10 mg to the health department.  Please continue your Trulicity, Jardiance 10 mg, NovoLog,  Tresiba 86 units.  I have ordered an ultrasound for your left arm swelling.  This will be at [facility].  Since they are closed right now I recommend you go to the emergency department to get this evaluated.  If you have any shortness of breath or chest pain please go to emergency department directly.

## 2018-12-08 NOTE — Assessment & Plan Note (Signed)
Patient with left upper extremity edema.  Unclear etiology.  Can consider DVT given that there is new edema as well as erythema.  Some pain with deep palpation as well.  I advised that she get an ultrasound.  Unfortunately due to the time of the appointment we were unable to schedule this while she was here.  I advised that she go to the emergency room to have this evaluated.  Patient refused.  I gave her the risks as including worsening of clot as well as pulmonary embolism.  She is to continue to refuse ED.  We will plan to call patient first thing in the morning to have an appointment time to get her ultrasound tomorrow.  CMA Sharyn Lull will call patient in the morning to have this scheduled.  Discussed with Dr. Gwendlyn Deutscher who also came and evaluated patient.  Agrees with plan.

## 2018-12-09 ENCOUNTER — Ambulatory Visit (HOSPITAL_COMMUNITY): Payer: Self-pay | Attending: Family Medicine

## 2018-12-09 ENCOUNTER — Telehealth: Payer: Self-pay

## 2018-12-09 NOTE — Telephone Encounter (Signed)
Called patient and LVM informing her that Vascular Ultrasound has been schedule for 12/022020 @ 1300.  Arrival is 1245 through C entrance of  on the [place] side.  If patient calls [facility] for price of exam, I was not able to get that information.  She will need to set up arrangements with them.  Ozella Almond, [place]

## 2018-12-10 ENCOUNTER — Telehealth: Payer: Self-pay

## 2018-12-10 NOTE — Telephone Encounter (Signed)
Called patient x 2 to inquire as to whether she still wanted to get the ultrasound.  There is still no answer and another voicemail was left.  If patient calls and still wants to be imaged, please let Red Team know so that procedure can be scheduled.  Ozella Almond, CMA'

## 2018-12-11 ENCOUNTER — Telehealth: Payer: Self-pay | Admitting: Family Medicine

## 2018-12-11 ENCOUNTER — Ambulatory Visit (HOSPITAL_COMMUNITY): Payer: Self-pay

## 2018-12-11 NOTE — Chronic Care Management (AMB) (Signed)
  Care Management   Outreach Note  12/11/2018 Name: Margaret Larsen MRN: [id number] DOB: 1970-11-09  Referred by: Patriciaann Clan, DO Reason for referral : Care Management (CM Initial outreach unsuccessful)   An unsuccessful telephone outreach was attempted today. The patient was referred to the case management team by for assistance with care management and care coordination.   Follow Up Plan: A HIPPA compliant phone message was left for the patient providing contact information and requesting a return call.  The care management team will reach out to the patient again over the next 7 days.  If patient returns call to provider office, please advise to call Embedded Care Management Care Guide Glenna Durand  at [place], [place], [place] Management ??[email or web address] ??[phone]

## 2018-12-14 ENCOUNTER — Other Ambulatory Visit: Payer: Self-pay | Admitting: Family Medicine

## 2018-12-14 DIAGNOSIS — E1165 Type 2 diabetes mellitus with hyperglycemia: Secondary | ICD-10-CM

## 2018-12-14 DIAGNOSIS — IMO0002 Reserved for concepts with insufficient information to code with codable children: Secondary | ICD-10-CM

## 2018-12-18 NOTE — Chronic Care Management (AMB) (Signed)
  Care Management   Note  12/18/2018 Name: Margaret Larsen MRN: [id number] DOB: Jan 29, 1970  Margaret Larsen is a 48 y.o. year old female who is a primary care patient of Patriciaann Clan, DO. I reached out to [employer] by phone today in response to a referral sent by Margaret Larsen's health plan.    Margaret Larsen was given information about care management services today including:  1. Care management services include personalized support from designated clinical staff supervised by her physician, including individualized plan of care and coordination with other care providers 2. 24/7 contact phone numbers for assistance for urgent and routine care needs. 3. The patient may stop care management services at any time by phone call to the office staff.  Patient agreed to services and verbal consent obtained.   Follow up plan: Telephone appointment with CM team member scheduled for: 12/22/2018  Glenna Durand, LPN Health Advisor, [facility] ??[email or web address] ??[phone]

## 2018-12-21 ENCOUNTER — Other Ambulatory Visit: Payer: Self-pay | Admitting: Family Medicine

## 2018-12-21 DIAGNOSIS — J029 Acute pharyngitis, unspecified: Secondary | ICD-10-CM

## 2018-12-21 DIAGNOSIS — J302 Other seasonal allergic rhinitis: Secondary | ICD-10-CM

## 2018-12-22 ENCOUNTER — Telehealth: Payer: Self-pay

## 2018-12-22 ENCOUNTER — Other Ambulatory Visit: Payer: Self-pay

## 2019-02-04 ENCOUNTER — Other Ambulatory Visit: Payer: Self-pay | Admitting: Family Medicine

## 2019-02-04 DIAGNOSIS — J029 Acute pharyngitis, unspecified: Secondary | ICD-10-CM

## 2019-02-04 DIAGNOSIS — J302 Other seasonal allergic rhinitis: Secondary | ICD-10-CM

## 2019-03-10 ENCOUNTER — Other Ambulatory Visit: Payer: Self-pay | Admitting: Family Medicine

## 2019-03-10 DIAGNOSIS — J302 Other seasonal allergic rhinitis: Secondary | ICD-10-CM

## 2019-03-24 ENCOUNTER — Other Ambulatory Visit: Payer: Self-pay | Admitting: Family Medicine

## 2019-03-24 DIAGNOSIS — J302 Other seasonal allergic rhinitis: Secondary | ICD-10-CM

## 2019-03-24 DIAGNOSIS — J029 Acute pharyngitis, unspecified: Secondary | ICD-10-CM

## 2019-04-06 ENCOUNTER — Other Ambulatory Visit: Payer: Self-pay | Admitting: Family Medicine

## 2019-04-06 DIAGNOSIS — E1165 Type 2 diabetes mellitus with hyperglycemia: Secondary | ICD-10-CM

## 2019-04-06 DIAGNOSIS — IMO0002 Reserved for concepts with insufficient information to code with codable children: Secondary | ICD-10-CM

## 2019-04-19 ENCOUNTER — Telehealth: Payer: Self-pay

## 2019-04-19 ENCOUNTER — Other Ambulatory Visit: Payer: Self-pay | Admitting: Family Medicine

## 2019-04-19 DIAGNOSIS — B379 Candidiasis, unspecified: Secondary | ICD-10-CM

## 2019-04-19 MED ORDER — FLUCONAZOLE 150 MG PO TABS: 150.0000 mg | ORAL_TABLET | Freq: Every day | ORAL | 0 refills | Status: DC

## 2019-04-19 NOTE — Telephone Encounter (Signed)
Refilled diflucan, sent to [facility]. Thank you so much for getting her scheduled for a diabetes follow up!   Patriciaann Clan, DO

## 2019-04-19 NOTE — Telephone Encounter (Signed)
Patient calls nurse line stating she has a yeast infection due to her high sugars. Patient stated this is something that happens often when her sugars are running high, this is an active medication on her list. Patient is overdue for a DM visit. I scheduled her for May with PCP. In the meantime patient would like this called into [facility].

## 2019-05-14 ENCOUNTER — Other Ambulatory Visit: Payer: Self-pay

## 2019-05-14 ENCOUNTER — Ambulatory Visit (INDEPENDENT_AMBULATORY_CARE_PROVIDER_SITE_OTHER): Payer: Self-pay | Admitting: Family Medicine

## 2019-05-14 ENCOUNTER — Encounter: Payer: Self-pay | Admitting: Family Medicine

## 2019-05-14 VITALS — BP 128/84 | HR 82 | Ht 59.0 in | Wt 207.4 lb

## 2019-05-14 DIAGNOSIS — E1142 Type 2 diabetes mellitus with diabetic polyneuropathy: Secondary | ICD-10-CM

## 2019-05-14 DIAGNOSIS — M6289 Other specified disorders of muscle: Secondary | ICD-10-CM

## 2019-05-14 DIAGNOSIS — E118 Type 2 diabetes mellitus with unspecified complications: Secondary | ICD-10-CM

## 2019-05-14 DIAGNOSIS — Z0000 Encounter for general adult medical examination without abnormal findings: Secondary | ICD-10-CM

## 2019-05-14 DIAGNOSIS — E1165 Type 2 diabetes mellitus with hyperglycemia: Secondary | ICD-10-CM

## 2019-05-14 DIAGNOSIS — Z1231 Encounter for screening mammogram for malignant neoplasm of breast: Secondary | ICD-10-CM

## 2019-05-14 DIAGNOSIS — F319 Bipolar disorder, unspecified: Secondary | ICD-10-CM

## 2019-05-14 DIAGNOSIS — M791 Myalgia, unspecified site: Secondary | ICD-10-CM

## 2019-05-14 DIAGNOSIS — IMO0002 Reserved for concepts with insufficient information to code with codable children: Secondary | ICD-10-CM

## 2019-05-14 LAB — POCT GLYCOSYLATED HEMOGLOBIN (HGB A1C): HbA1c, POC (controlled diabetic range): 11 % — AB (ref 0.0–7.0)

## 2019-05-14 MED ORDER — EMPAGLIFLOZIN 10 MG PO TABS: 10.0000 mg | ORAL_TABLET | Freq: Every day | ORAL | 0 refills | Status: DC

## 2019-05-14 MED ORDER — AGAMATRIX PRESTO TEST VI STRP: ORAL_STRIP | 99 refills | Status: DC

## 2019-05-14 NOTE — Patient Instructions (Addendum)
It was a wonderful seeing you today.  I have resent in the [place], please let me know if they do not have it.  I would like you to check your sugars fasting in the morning before you eat and then 2 hours after your meal.  Please keep a journal of these numbers and bring them to your next visit.  I would like to continue monitoring your muscle fatigue for now as we hopefully improve your diabetes.  If you are noticing this worsening, please let me know.  I would like you to follow-up with Dr. Valentina Lucks in the next few weeks and then with myself in approximately 1-2 months.   Outpatient Mental Health Providers (No Insurance required or Self Pay)  [facility] Mon-Fri, 8:30-5:00  [address] [place], [place] [address]    [phone] [email or web address]  To schedule an appointment call [phone] (Immediate assistance)  [place] Mon-Fri, 8am-3pm [email or web address] [address] [place], [place]   [place] [phone]  [facility]   Walk-in-Clinic: Monday- Friday 9:00 AM - 4:00 PM [place], [place] [phone]  Family [place] ([place]) walk in M-F 8am-12pm and  1pm-3pm [place]  [place]  Phone: [phone]  [employer] ([place] and substance challenges) [address], [place] [phone]    [email or web address] [place], [place]     Phone:  [phone] [place]  [place]  [place]  [phone] [email or web address]   [facility] ( virtual or zoom therapy) [email or web address]  [place]  [place]     [facility] [phone]  grief counseling, dementia and caregiver support    [facility] Walk-in MWF 12:30 to 3:00     [place] [address]  [phone]  [email or web address] call to schedule an appointment    [place] ,Support group, Peer support services, [address] [place], [place] [address] [phone]  [email or web address]           National Alliance on Mental Illness (NAMI) [place]- Wellness classes, Support groups        [address], [place], [place] [address] [phone]   CurrentJokes.cz   [facility]  (Psycho-social Rehabilitation clubhouse, Individual and group therapy) [address],  [address]   [phone]  24- Hour Availability:  *Advance or [phone] * Family Service of the [employer] (Domestic Violence, Rape, etc. )[phone] Beverly Sessions [phone] or [phone] * [facility] [place] [phone] (after hours) *Therapeutic Alternative Mobile Crisis Unit [phone] *[place] National Suicide Hotline (609) 558-6419 Diamantina Monks)      Psychiatry Resource List (Adults and Children) Most of these providers will take Medicaid. please consult your insurance for a complete and updated list of available providers. When calling to make an appointment have your insurance information available to confirm you are covered.  [place]:   [place]: [address]     [phone]   Linna Hoff: [address],  [phone] [place]: [address] [place],    [place]: [place] [address],                   [phone] Children: [place] [address]         [phone]  [place]  (Psychiatry & counseling ; adults & children ; will take Medicaid) [place], [place], [place]        [phone]   [place]  (Psychiatry only; Adults only, will take Medicaid)  [place], [place], [place] [address]       [phone]   [place] (Psychiatry & counseling ; adults & children ; will take Medicaid [address]  [place] [address]   Go on-line to complete referral ( [email or web address]/en/make-[email or web address]-referral [phone]    (Spanish therapist)  [facility]  Psychiatry & counseling; Adults and children;  Call Registration prior to scheduling an appointment [phone] [address] [place]    [place], [place] [address]    [phone]  [facility] (Psychiatry & counseling; adults & children; Medicare no Medicaid)  [place], [place]  [address]      [phone]    [facility] (up to age 27)  Psychiatry & counseling ,will take Medicaid, must do counseling to receive psychiatry services  [address] [place] [address]        ([place] (Psychiatry & counseling; adults & children; will take Medicaid) Will need a referral from provider [address],  [place], [place]  [phone]  [facility]---  Walk-in Mon-Fri, 8:30-5:00 (will take Medicaid)  [address], [place], [place]  [phone]  To schedule an appointment call [phone]  [facility] --- Walk-In Mon-Friday 8am-3pm ( will take Medicaid, Psychiatry, Adults & children,  [address], [place], [place]   [phone]   Family [place]--, Walk-in M-F 8am-12pm and 1pm -3pm   (Counseling, Psychiatry, will take Medicaid, adults & children)  [address], [place], [place]  [phone]    Therapy and Counseling Resources Most providers on this list will take Medicaid. Patients with commercial insurance or Medicare should contact their insurance company to get a list of in  network providers.  [employer]  [address] [place], [place] [address]      [place] [address]  [place], [place] [address] [phone]  [place] [address] [place], [place] [address]       [place] [address] [place], [place]    Jinny Blossom [facility] Care [address], [place], [place]  Family Solutions:  [place]. [place] [phone]  [facility]:  [place] [address] Muse, [place]  [facility] (under & uninsured) [address], [place] [phone]    [email or web address]    [facility] [place]     Phone:  [place]  [phone]   [facility] [address] [phone]  [place], [place] ext [place]: [place], [place], [place]   [place] (Spanish therapist) [place] [address]   [place] [address]    [phone]    [facility]   [place],  [place], [place]   [place]as [place]  [place]  [facility]  [address] [place], [place]        [phone]  Open Access/Walk In Clinic under & uninsured [place], To schedule an appointment call [phone]- [phone] [address] [place] [phone]):  [place] Knock - Fri from 8 AM - 3 PM  Family Service of the [place],  ([place])   [place], [place]: [phone]) 8:30 - 12; 1 - 2:30  Family Service of the [place],  [place], [place]    [phone]):8:30 - 12; 2 - 3PM  [facility] [place],  [address]  [place]; [phone]):   Mon -  Fri 8 AM - 5 PM  Alcohol & Drug Services [place]  MWF 12:30 to 3:00 or call to schedule an appointment  [phone]  Specific Provider options Psychology Today  https://www.psychologytoday.com/us 1. click on find a therapist  2. enter your zip code 3. left side and select or tailor a therapist for your specific need.   [facility] Provider Directory [email or web address]  (Medicaid)   Follow all drop down to find a provider  [place] or [email or web address] [address], [place], [place] Recovery support and educational   In home counseling [place] Telephone: [phone]  office in [place] [email or web address]   Does not take reg. Medicaid or Medicare private insurance BCCS, [place] health Choice, [facility], [place], [place], [place], [place] Health Choice  24- Hour Availability:  . [place] or [phone]  . Family Service of the [employer] [phone]  [facility] Crisis Service  [phone]   . [place]  [phone] (after hours)  . Therapeutic Alternative/Mobile Crisis   [phone]  . [place] National Suicide Hotline  (404)177-9559 ([place])  . Call 911 or go to emergency room  . [employer]  [phone]);  [place] and [employer]   . [employer] ACCESS  [phone]); [place], [place], [place], [place], [place], [place], [place]

## 2019-05-14 NOTE — Progress Notes (Signed)
SUBJECTIVE:   CHIEF COMPLAINT / HPI: diabetes f/u   Diabetes follow up:  Last A1c 10.2 on 12/2018 Taking medications: Prescribed NovoLog 40 units with largest meal and 10 U with the second (however pt reports she usually only eats 1 meal/day so taking 30-40U), Tresiba 86 units in the morning, Trulicity 1.5 mg weekly. Says her pharmacy never told her the jardiance was ready in 12/2018 when initially prescribed.  Glucose average: random usually 300-400's, fasting 200's  Hypoglycemic symptoms? No   Exercise: Minimal, does have to carry heavy boxes at work  On Aspirin, and on statin: Prescribed pravastatin, however has not refilled Last eye exam: Needs one Last foot exam: needs one  Pneumococcal vaccine? Completed previously ROS: denies dizziness, diaphoresis, LOC, polyuria, polydipsia>>however will have polyuria if she forgets her medication   Muscle fatigue: She reports an approximate 1 year history of mainly upper extremity fatigue/weakness by the end of the day.  Initially noticed with her left arm first and now is bilateral for the past several months.  Says it feels like her arms get tired like they "give out," but does not feel she has any trouble with her hand grip.  Also feels like her bilateral legs will feel heavy by the end of the day.  No falls.  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  Known history of longstanding diabetic neuropathy, has stable numbness/tingling of upper/lower extremity on gabapentin.  No associated neck or back pain, bladder/bowel difficulties.  No ptosis or dysarthria.  Can walk as normal at the end of the day.    Office Visit from 05/14/2019 in [facility]  PHQ-9 Total Score  18    Answer to #10 is 0.   Health maintenance: Due for eye exam, Pap smear, mammogram  PERTINENT  PMH / PSH: OSA, T2DM, GERD, HTN   OBJECTIVE:   BP 128/84   Pulse 82   Ht 4' 11"  (1.499 m)   Wt 207 lb 6.4 oz (94.1 kg)   SpO2 97%    BMI 41.89 kg/m   General: Alert, NAD HEENT: NCAT Cardiac: RRR no m/g/r Lungs: no increased WOB  Msk: No obvious deformities or asymmetry noted to posture and cervical/thoracic/lumbar spine.  Nontender to palpation along cervical/thoracic spine and bilateral arms.  Sensation to light touch intact throughout bilateral upper extremity.  Full active ROM in bilateral UE.  5/5 upper extremity strength in elbow flexion/extension, wrist flexion/extension, pincer grip, finger adduction/abduction, shoulder IR/ER/adduction/abduction/F/E. No muscular "give out" after several attempts. Negative Lhermitte's sign and modified Spurling's bilaterally.  Gait is normal.  5/5 hip/knee flexion and extension strength.  2/4 biceps reflexes bilaterally. Ext: Warm, dry, 2+ distal pulses, no edema   ASSESSMENT/PLAN:   Uncontrolled type 2 diabetes mellitus with complication W9U 04.5 today, uncontrolled, has not been <10 since 2013.  Suspect her poor control is likely multifactorial in the setting of intermittent compliance with financial and emotional stressors, dietary indiscretions, and sedentary lifestyle.  Resent in Jardiance 10 mg to HD, will follow up with patient next week to see if she was able to get this (no insurance, cost is a concern).  -Start a glucose log of fasting and 2-hour postprandial glucose, bring into next visit -Continue Tresiba 86 U and NovoLog 40U with main meal as is for now -Start Jardiance 10 mg, BMP collected today -Referral for ophthalmology placed --Lipid panel, discuss re-starting statin on follow up  -Should have urine albumin/creatinine ratio on  follow-up given history of proteinuria and not currently on ACE/ARB  Muscle fatigue Chronic, mildly progressive, mainly in bilateral upper extremity.  Unrevealing MSK exam.  Unclear etiology with broad differential, however question if this is related to generalized fatigue in the setting longstanding poorly controlled diabetes.  Consider  cervical myelopathy and myasthenia gravis, however given duration and without additional symptoms suggestive of these suspect it is less likely.  Doubt PMR without associated pains, but remains in differential as she reports proximal muscle fatigue. -We will work on diabetic control as discussed above and monitor symptoms -Obtain BMP, CK today> could consider CRP/ESR on follow-up -Return precautions discussed including worsening of perceived weakness  Bipolar disorder ([place]) Current moderately severe depression without SI or HI in the setting of home stresses (takes care of grandchildren, worries about her daughter currently incarcerated). Provided with resources to re-establish with therapy/psychiatrist again per patient's request. Will address bridging medications on follow up.   Healthcare maintenance Placed mammogram order. Discussed obtaining orange card/finanical aid packet to assess if qualifies for assistance as she is without insurance.    Recommended follow up with Dr. Valentina Lucks in the next 2-3 weeks as she could certainly benefit from close diabetic monitoring and extensive education. Otherwise, follow up with myself in 1-2 months.    Margaret Larsen, [place]

## 2019-05-15 LAB — BASIC METABOLIC PANEL
BUN/Creatinine Ratio: 13 (ref 9–23)
BUN: 8 mg/dL (ref 6–24)
CO2: 24 mmol/L (ref 20–29)
Calcium: 8.9 mg/dL (ref 8.7–10.2)
Chloride: 98 mmol/L (ref 96–106)
Creatinine, Ser: 0.63 mg/dL (ref 0.57–1.00)
GFR calc Af Amer: 121 mL/min/{1.73_m2} (ref >59)
GFR calc non Af Amer: 105 mL/min/{1.73_m2} (ref >59)
Glucose: 259 mg/dL — ABNORMAL HIGH (ref 65–99)
Potassium: 4.3 mmol/L (ref 3.5–5.2)
Sodium: 137 mmol/L (ref 134–144)

## 2019-05-15 LAB — LIPID PANEL
Chol/HDL Ratio: 4.5 ratio — ABNORMAL HIGH (ref 0.0–4.4)
Cholesterol, Total: 201 mg/dL — ABNORMAL HIGH (ref 100–199)
HDL: 45 mg/dL
LDL Chol Calc (NIH): 140 mg/dL — ABNORMAL HIGH (ref 0–99)
Triglycerides: 88 mg/dL (ref 0–149)
VLDL Cholesterol Cal: 16 mg/dL (ref 5–40)

## 2019-05-15 LAB — CK: Total CK: 173 U/L (ref 32–182)

## 2019-05-18 ENCOUNTER — Telehealth: Payer: Self-pay

## 2019-05-18 DIAGNOSIS — M6289 Other specified disorders of muscle: Secondary | ICD-10-CM | POA: Insufficient documentation

## 2019-05-18 NOTE — Assessment & Plan Note (Signed)
Chronic, mildly progressive, mainly in bilateral upper extremity.  Unrevealing MSK exam.  Unclear etiology with broad differential, however question if this is related to generalized fatigue in the setting longstanding poorly controlled diabetes.  Consider cervical myelopathy and myasthenia gravis, however given duration and without additional symptoms suggestive of these suspect it is less likely.  Doubt PMR without associated pains, but remains in differential as she reports proximal muscle fatigue. -We will work on diabetic control as discussed above and monitor symptoms -Obtain BMP, CK today> could consider CRP/ESR on follow-up -Return precautions discussed including worsening of perceived weakness

## 2019-05-18 NOTE — Telephone Encounter (Signed)
Patient calls nurse line regarding cough, sore throat, fever, and chills. Patient reports symptom onset Saturday 5/8. Patient is unable to give temperature, states she "feels warm". Patient reports productive cough with yellow phlegm. Patient denies sick contacts and has not received COVID vaccination. Patient states that she is scheduled [facility]. Patient scheduled for virtual visit with PCP tomorrow afternoon.   Strict ED precautions given.   To PCP  Talbot Grumbling, RN

## 2019-05-18 NOTE — Assessment & Plan Note (Signed)
A1c 11.2 today, uncontrolled, has not been <10 since 2013.  Suspect her poor control is likely multifactorial in the setting of intermittent compliance with financial and emotional stressors, dietary indiscretions, and sedentary lifestyle.  Resent in Jardiance 10 mg to HD, will follow up with patient next week to see if she was able to get this (no insurance, cost is a concern).  -Start a glucose log of fasting and 2-hour postprandial glucose, bring into next visit -Continue Tresiba 86 U and NovoLog 40U with main meal as is for now -Start Jardiance 10 mg, BMP collected today -Referral for ophthalmology placed -Should have urine albumin/creatinine ratio on follow-up given history of proteinuria and not currently on ACE/ARB

## 2019-05-18 NOTE — Telephone Encounter (Signed)
Appreciate your help Jarrett Soho, I will discuss this further with her tomorrow afternoon. Hopefully her COVID result will be back by this time.   Patriciaann Clan, DO

## 2019-05-19 ENCOUNTER — Telehealth (INDEPENDENT_AMBULATORY_CARE_PROVIDER_SITE_OTHER): Payer: Self-pay | Admitting: Family Medicine

## 2019-05-19 ENCOUNTER — Encounter: Payer: Self-pay | Admitting: Family Medicine

## 2019-05-19 DIAGNOSIS — U071 COVID-19: Secondary | ICD-10-CM

## 2019-05-19 DIAGNOSIS — Z0000 Encounter for general adult medical examination without abnormal findings: Secondary | ICD-10-CM | POA: Insufficient documentation

## 2019-05-19 MED ORDER — BENZONATATE 200 MG PO CAPS: 200.0000 mg | ORAL_CAPSULE | Freq: Two times a day (BID) | ORAL | 0 refills | Status: DC | PRN

## 2019-05-19 NOTE — Assessment & Plan Note (Signed)
Current moderately severe depression without SI or HI in the setting of home stresses (takes care of grandchildren, worries about her daughter currently incarcerated). Provided with resources to re-establish with therapy/psychiatrist again per patient's request. Will address bridging medications on follow up.

## 2019-05-19 NOTE — Progress Notes (Signed)
[place] Telemedicine Visit  Patient consented to have virtual visit and was identified by name and date of birth. Method of visit: Video  Encounter participants: Patient: Margaret Larsen - located at home Provider: Patriciaann Clan - located at home  Others (if applicable): none   Chief Complaint: COVID   HPI: Margaret Larsen is a 49 yo female presenting to discuss her recent COVID diagnosis.   Started experiencing symptoms on 5/8, however did have some fatigue and lack of appetite a few days prior to this. [facility] Currently experiencing sore throat, fever/chills, lack of taste of taste/smell, body aches, and dry coughing. Denies chest pain, SOB, confusion, vomiting, or diarrhea. Not eating much but drinking fluids. No one else at home is sick, lives with her children and fiance, all of which are getting tested and quarantining. Has not had the covid vaccine or COVID previously.   Needs letter for work, works at [employer]. Hoping for something for sx relief.    ROS: per HPI  Pertinent PMHx: OSA, Uncontrolled T2DM, GERD, HTN, elevated BMI   Exam:  There were no vitals taken for this visit.  General: NAD, appeared fatigued  Respiratory: Unlabored breathing, no coughing during conversation   Assessment/Plan:  COVID-19 Tested positive on 5/11, sx onset 5/8. No concerning sx suggesting further evaluation at this time, however discussed ED precautions. Sent in [place], may use tylenol PRN. Encouraged hydration and multivitamin. Instructed on strict quarantine at home for 10 days if fever free/symptoms improving by this time. Work Quarry manager provided. Given her co-morbidities she is considered high risk for decompensation, placed referral for o/p monoclonal antibody infusion.     Time spent during visit with patient: 15 minutes  Follow up if symptoms not improving as approaching end quarantine date or sooner if worsening. ED if chest pain,  difficulty breathing, confusion, or inability to maintain hydration.   Patriciaann Clan, DO

## 2019-05-19 NOTE — Assessment & Plan Note (Signed)
Placed mammogram order. Discussed obtaining orange card/finanical aid packet to assess if qualifies for assistance as she is without insurance.

## 2019-05-20 ENCOUNTER — Encounter: Payer: Self-pay | Admitting: Family Medicine

## 2019-05-20 ENCOUNTER — Other Ambulatory Visit: Payer: Self-pay | Admitting: Family Medicine

## 2019-05-20 DIAGNOSIS — U071 COVID-19: Secondary | ICD-10-CM | POA: Insufficient documentation

## 2019-05-20 DIAGNOSIS — IMO0002 Reserved for concepts with insufficient information to code with codable children: Secondary | ICD-10-CM

## 2019-05-20 DIAGNOSIS — J302 Other seasonal allergic rhinitis: Secondary | ICD-10-CM

## 2019-05-20 DIAGNOSIS — E1165 Type 2 diabetes mellitus with hyperglycemia: Secondary | ICD-10-CM

## 2019-05-20 NOTE — Assessment & Plan Note (Addendum)
Tested positive on 5/11, sx onset 5/8. No concerning sx suggesting further evaluation at this time, however discussed ED precautions. Sent in [place], may use tylenol PRN. Encouraged hydration and multivitamin. Instructed on strict quarantine at home for 10 days if fever free/symptoms improving by this time. Work Quarry manager provided. Given her co-morbidities she is considered high risk for decompensation, placed referral for o/p monoclonal antibody infusion.

## 2019-05-21 ENCOUNTER — Encounter (HOSPITAL_COMMUNITY): Payer: Self-pay

## 2019-05-21 ENCOUNTER — Telehealth: Payer: Self-pay

## 2019-05-21 ENCOUNTER — Other Ambulatory Visit: Payer: Self-pay | Admitting: Adult Health

## 2019-05-21 ENCOUNTER — Ambulatory Visit (HOSPITAL_COMMUNITY)
Admission: RE | Admit: 2019-05-21 | Discharge: 2019-05-21 | Disposition: A | Discharge status: Home / Self Care | Payer: HRSA Program | Source: Ambulatory Visit | Attending: Pulmonary Disease | Admitting: Pulmonary Disease

## 2019-05-21 DIAGNOSIS — U071 COVID-19: Secondary | ICD-10-CM | POA: Diagnosis present

## 2019-05-21 DIAGNOSIS — E119 Type 2 diabetes mellitus without complications: Secondary | ICD-10-CM | POA: Insufficient documentation

## 2019-05-21 MED ORDER — SODIUM CHLORIDE 0.9 % IV SOLN: INTRAVENOUS | Status: DC | PRN

## 2019-05-21 MED ORDER — METHYLPREDNISOLONE SODIUM SUCC 125 MG IJ SOLR: 125.0000 mg | Freq: Once | INTRAMUSCULAR | Status: DC | PRN

## 2019-05-21 MED ORDER — ALBUTEROL SULFATE HFA 108 (90 BASE) MCG/ACT IN AERS: 2.0000 | INHALATION_SPRAY | Freq: Once | RESPIRATORY_TRACT | Status: DC | PRN

## 2019-05-21 MED ORDER — EPINEPHRINE 0.3 MG/0.3ML IJ SOAJ: 0.3000 mg | Freq: Once | INTRAMUSCULAR | Status: DC | PRN

## 2019-05-21 MED ORDER — FAMOTIDINE IN NACL 20-0.9 MG/50ML-% IV SOLN: 20.0000 mg | Freq: Once | INTRAVENOUS | Status: DC | PRN

## 2019-05-21 MED ORDER — SODIUM CHLORIDE 0.9 % IV SOLN
Freq: Once | INTRAVENOUS | Status: AC
  Filled 2019-05-21: qty 700

## 2019-05-21 NOTE — Progress Notes (Signed)
  I connected by phone with Timmothy Sours on 05/21/2019 at 11:02 AM to discuss the potential use of an new treatment for mild to moderate COVID-19 viral infection in non-hospitalized patients.  This patient is a 49 y.o. female that meets the FDA criteria for Emergency Use Authorization of bamlanivimab/etesevimab or casirivimab/imdevimab.  Has a (+) direct SARS-CoV-2 viral test result  Has mild or moderate COVID-19   Is ? 49 years of age and weighs ? 40 kg  Is NOT hospitalized due to COVID-19  Is NOT requiring oxygen therapy or requiring an increase in baseline oxygen flow rate due to COVID-19  Is within 10 days of symptom onset  Has at least one of the high risk factor(s) for progression to severe COVID-19 and/or hospitalization as defined in EUA.  Specific high risk criteria : Diabetes   I have spoken and communicated the following to the patient or parent/caregiver:  1. FDA has authorized the emergency use of bamlanivimab/etesevimab and casirivimab\imdevimab for the treatment of mild to moderate COVID-19 in adults and pediatric patients with positive results of direct SARS-CoV-2 viral testing who are 33 years of age and older weighing at least 40 kg, and who are at high risk for progressing to severe COVID-19 and/or hospitalization.  2. The significant known and potential risks and benefits of bamlanivimab/etesevimab and casirivimab\imdevimab, and the extent to which such potential risks and benefits are unknown.  3. Information on available alternative treatments and the risks and benefits of those alternatives, including clinical trials.  4. Patients treated with bamlanivimab/etesevimab and casirivimab\imdevimab should continue to self-isolate and use infection control measures (e.g., wear mask, isolate, social distance, avoid sharing personal items, clean and disinfect "high touch" surfaces, and frequent handwashing) according to CDC guidelines.   5. The patient or  parent/caregiver has the option to accept or refuse bamlanivimab/etesevimab or casirivimab\imdevimab .  After reviewing this information with the patient, The patient agreed to proceed with receiving the bamlanimivab infusion and will be provided a copy of the Fact sheet prior to receiving the infusion.  Will bring copy of test results. Scheduled 05/21/19 at 1430.  Allergic to Benadryl , removed from order set .  Marland Kitchen Rajanee Schuelke 05/21/2019 11:02 AM

## 2019-05-21 NOTE — Telephone Encounter (Signed)
Pt informed of all information below. Margaret Larsen Kennon Holter, CMA

## 2019-05-21 NOTE — Progress Notes (Signed)
  Diagnosis: COVID-19  Physician: Dr. Wright  Procedure: Covid Infusion Clinic Med: bamlanivimab\etesevimab infusion - Provided patient with bamlanimivab\etesevimab fact sheet for patients, parents and caregivers prior to infusion.  Complications: No immediate complications noted.  Discharge: Discharged home   Jhordan Mckibben S Breelyn Icard 05/21/2019  

## 2019-05-21 NOTE — Discharge Instructions (Signed)

## 2019-05-21 NOTE — Telephone Encounter (Signed)
Please let her know she can try Robitussin (OTC), tea with a little honey to coat her throat, and cough lozenges/drops. I agree with Hannah's additional recommendations. Thank you!   Patriciaann Clan, DO

## 2019-05-21 NOTE — Telephone Encounter (Signed)
Patient calls nurse line regarding continuing cough related to COVID. Patient prescribed tessalon during virtual visit with provider on 5/12. Patient reports that cough has persisted and has not gotten any better with addition of medicine. Patient calling to see if there is anything else she can be prescribed for cough. Advised patient to use humidifier, drink plenty of fluids and to use tylenol as needed.   Please advise if patient can receive anything else for cough.   Strict ED precautions given.  To PCP  Talbot Grumbling, RN

## 2019-05-23 ENCOUNTER — Other Ambulatory Visit: Payer: Self-pay

## 2019-05-23 ENCOUNTER — Emergency Department (HOSPITAL_COMMUNITY)
Admission: EM | Admit: 2019-05-23 | Discharge: 2019-05-23 | Disposition: A | Discharge status: Home / Self Care | Payer: Self-pay | Attending: Emergency Medicine | Admitting: Emergency Medicine

## 2019-05-23 ENCOUNTER — Emergency Department (HOSPITAL_COMMUNITY): Payer: Self-pay

## 2019-05-23 ENCOUNTER — Encounter (HOSPITAL_COMMUNITY): Payer: Self-pay | Admitting: Emergency Medicine

## 2019-05-23 DIAGNOSIS — Z79899 Other long term (current) drug therapy: Secondary | ICD-10-CM | POA: Insufficient documentation

## 2019-05-23 DIAGNOSIS — R739 Hyperglycemia, unspecified: Secondary | ICD-10-CM

## 2019-05-23 DIAGNOSIS — Z91010 Allergy to peanuts: Secondary | ICD-10-CM | POA: Insufficient documentation

## 2019-05-23 DIAGNOSIS — E1165 Type 2 diabetes mellitus with hyperglycemia: Secondary | ICD-10-CM | POA: Insufficient documentation

## 2019-05-23 DIAGNOSIS — R0602 Shortness of breath: Secondary | ICD-10-CM

## 2019-05-23 DIAGNOSIS — Z794 Long term (current) use of insulin: Secondary | ICD-10-CM | POA: Insufficient documentation

## 2019-05-23 DIAGNOSIS — I10 Essential (primary) hypertension: Secondary | ICD-10-CM | POA: Insufficient documentation

## 2019-05-23 DIAGNOSIS — U071 COVID-19: Secondary | ICD-10-CM | POA: Insufficient documentation

## 2019-05-23 DIAGNOSIS — Z7982 Long term (current) use of aspirin: Secondary | ICD-10-CM | POA: Insufficient documentation

## 2019-05-23 LAB — CBC
HCT: 43.7 % (ref 36.0–46.0)
Hemoglobin: 14.6 g/dL (ref 12.0–15.0)
MCH: 27.7 pg (ref 26.0–34.0)
MCHC: 33.4 g/dL (ref 30.0–36.0)
MCV: 82.8 fL (ref 80.0–100.0)
Platelets: 346 10*3/uL (ref 150–400)
RBC: 5.28 MIL/uL — ABNORMAL HIGH (ref 3.87–5.11)
RDW: 12.8 % (ref 11.5–15.5)
WBC: 7 10*3/uL (ref 4.0–10.5)
nRBC: 0 % (ref 0.0–0.2)

## 2019-05-23 LAB — BASIC METABOLIC PANEL WITH GFR
Anion gap: 9 (ref 5–15)
BUN: 6 mg/dL (ref 6–20)
CO2: 26 mmol/L (ref 22–32)
Calcium: 8.8 mg/dL — ABNORMAL LOW (ref 8.9–10.3)
Chloride: 95 mmol/L — ABNORMAL LOW (ref 98–111)
Creatinine, Ser: 0.8 mg/dL (ref 0.44–1.00)
GFR calc Af Amer: >60 mL/min
GFR calc non Af Amer: >60 mL/min
Glucose, Bld: 348 mg/dL — ABNORMAL HIGH (ref 70–99)
Potassium: 4.1 mmol/L (ref 3.5–5.1)
Sodium: 130 mmol/L — ABNORMAL LOW (ref 135–145)

## 2019-05-23 LAB — I-STAT BETA HCG BLOOD, ED (MC, WL, AP ONLY): I-stat hCG, quantitative: <5 m[IU]/mL

## 2019-05-23 LAB — CBG MONITORING, ED: Glucose-Capillary: 245 mg/dL — ABNORMAL HIGH (ref 70–99)

## 2019-05-23 LAB — TROPONIN I (HIGH SENSITIVITY)
Troponin I (High Sensitivity): 6 ng/L (ref <18)
Troponin I (High Sensitivity): 6 ng/L (ref <18)

## 2019-05-23 MED ORDER — SODIUM CHLORIDE 0.9 % IV BOLUS
1000.0000 mL | Freq: Once | INTRAVENOUS | Status: AC
  Administered 2019-05-23: 1000 mL via INTRAVENOUS

## 2019-05-23 MED ORDER — GUAIFENESIN 100 MG/5ML PO SYRP: 100.0000 mg | ORAL_SOLUTION | ORAL | 0 refills | Status: DC | PRN

## 2019-05-23 MED ORDER — DEXAMETHASONE SODIUM PHOSPHATE 10 MG/ML IJ SOLN
8.0000 mg | Freq: Once | INTRAMUSCULAR | Status: AC
  Administered 2019-05-23: 8 mg via INTRAVENOUS
  Filled 2019-05-23: qty 1

## 2019-05-23 MED ORDER — DEXAMETHASONE 6 MG PO TABS
6.0000 mg | ORAL_TABLET | Freq: Every day | ORAL | 0 refills | Status: DC
Start: 2019-05-23 — End: 2019-05-26

## 2019-05-23 MED ORDER — SODIUM CHLORIDE 0.9% FLUSH
3.0000 mL | Freq: Once | INTRAVENOUS | Status: AC
  Administered 2019-05-23: 3 mL via INTRAVENOUS

## 2019-05-23 MED ORDER — FLUTICASONE PROPIONATE 50 MCG/ACT NA SUSP
2.0000 | Freq: Every day | NASAL | 0 refills | Status: DC
Start: 2019-05-23 — End: 2019-10-28

## 2019-05-23 MED ORDER — INSULIN ASPART 100 UNIT/ML ~~LOC~~ SOLN
10.0000 [IU] | Freq: Once | SUBCUTANEOUS | Status: AC
  Administered 2019-05-23: 10 [IU] via INTRAVENOUS

## 2019-05-23 NOTE — Discharge Instructions (Signed)
We are sending you home with a short course of steroids.  Is very important you check your blood sugars.  If your blood sugars are greater than 300 you need to stop taking this medication.  Take your insulin as prescribed.  Make sure to rest and drink plenty of fluids.  Your symptoms should start improving.  Return for any worsening symptoms.  Follow up with primary care doctor tomorrow or the next day for reevaluation

## 2019-05-23 NOTE — ED Provider Notes (Signed)
[facility] Provider Note   CSN: [id number] Arrival date & time: 05/23/19  0857     History Chief Complaint  Patient presents with   Shortness of Breath    covid    Margaret Larsen is a 49 y.o. female with past medical history significant for diabetes, hypertension who presents for evaluation of Covid symptoms.  Diagnosed on Tuesday.  Patient with complaints of nonproductive cough, body aches, intermittent shortness of breath, chest pain with coughing.  Admits to decreased appetite, loss of taste and smell.  Has not been taking her insulin due to not feeling well and lack of appetite.  Has been taking Tessalon Perles without help of her cough.  Chest pain is nonexertional in nature.  Does have mild pleuritic component.  No associated diaphoresis, nausea, vomiting.  No fever, chills, headache, lightheadedness, dizziness, abdominal pain, diarrhea, dysuria, rashes or lesions.  Denies additional aggravating relieving factors.  History obtained from patient and past medical records.  No interpreter is used.  HPI     Past Medical History:  Diagnosis Date   Cough 05/13/2016   Diabetes mellitus    Hemorrhoids 07/10/2010   Hypertension    Kidney stones    Kidney stones    Myalgia and myositis 06/19/2015   Renal disorder     Patient Active Problem List   Diagnosis Date Noted   COVID-19 05/20/2019   Healthcare maintenance 05/19/2019   Muscle fatigue 05/18/2019   Left upper extremity swelling 12/08/2018   Sore throat 03/13/2017   Vertigo 06/21/2016   Abscess of gluteal region 04/19/2016   Panic attack 02/06/2016   Hair loss 12/13/2015   Allergic rhinitis 04/25/2015   Hyperlipidemia 11/03/2014   Irreducible ventral hernia 10/25/2014   Atypical squamous cells of undetermined significance (ASCUS) on Papanicolaou smear of cervix 03/22/2013   At risk for healthcare associated infection 03/15/2013   GERD (gastroesophageal  reflux disease) 11/13/2012   Glaucoma suspect of both eyes 06/25/2012   OSA (obstructive sleep apnea) 10/18/2010   Diarrhea 02/13/2010   Diabetic peripheral neuropathy ([place]) 01/23/2010   PERSONAL HISTORY OF ALLERGY TO PEANUTS 11/02/2009   PTSD 07/31/2009   Uncontrolled type 2 diabetes mellitus with complication ([place]) 123456   HYPERTENSION, BENIGN ESSENTIAL 11/12/2007   DYSLIPIDEMIA 10/02/2007   Bipolar disorder ([place]) 08/11/2007   OBESITY, NOS 03/06/2006    Past Surgical History:  Procedure Laterality Date   CESAREAN SECTION     CHOLECYSTECTOMY     fiborids removed     TUBAL LIGATION       OB History   No obstetric history on file.     Family History  Problem Relation Age of Onset   Hypertension Mother    Diabetes Mother    Cancer Mother     Social History   Tobacco Use   Smoking status: Never Smoker   Smokeless tobacco: Never Used  Substance Use Topics   Alcohol use: No   Drug use: No    Home Medications Prior to Admission medications   Medication Sig Start Date End Date Taking? Authorizing Provider  ACCU-CHEK FASTCLIX LANCETS MISC 1 each by Does not apply route 3 (three) times daily. ICD 9 250.92 06/29/13   Boykin Nearing, MD  aspirin 81 MG EC tablet Take 81 mg by mouth daily. Reported on 05/19/2015    [provider]  benzonatate (TESSALON) 200 MG capsule Take 1 capsule (200 mg total) by mouth 2 (two) times daily as needed for cough. 05/19/19  Patriciaann Clan, DO  Blood Glucose Monitoring Suppl (AGAMATRIX PRESTO PRO METER) DEVI 1 Device by Does not apply route once. 06/30/14   Zenia Resides, MD  cetirizine (ZYRTEC) 10 MG tablet Take 1 tablet (10 mg total) by mouth daily. 07/13/18   Patriciaann Clan, DO  dexamethasone (DECADRON) 6 MG tablet Take 1 tablet (6 mg total) by mouth daily. 05/23/19   Tatanisha Cuthbert A, PA-C  empagliflozin (JARDIANCE) 10 MG TABS tablet Take 10 mg by mouth daily. 05/14/19   Patriciaann Clan, DO    EPINEPHrine (EPI-PEN) 0.3 mg/0.3 mL DEVI Inject 0.3 mLs (0.3 mg total) into the muscle as needed. For allergic reaction Patient not taking: Reported on 11/02/2015 08/05/11   Katherina Mires, MD  fluticasone [facility]) 50 MCG/ACT nasal spray Place 2 sprays into both nostrils daily. 05/23/19   Lynnsie Linders A, PA-C  gabapentin (NEURONTIN) 300 MG capsule Take 2 capsules (600 mg total) by mouth 3 (three) times daily. 06/09/18   Lovenia Kim, MD  glucose blood (AGAMATRIX PRESTO TEST) test strip Use as instructed 05/14/19   Patriciaann Clan, DO  guaifenesin (ROBITUSSIN) 100 MG/5ML syrup Take 5-10 mLs (100-200 mg total) by mouth every 4 (four) hours as needed for cough. 05/23/19   Marche Hottenstein A, PA-C  insulin aspart (NOVOLOG FLEXPEN) 100 UNIT/ML FlexPen Inject 40 Units into the skin daily. 05/20/19   Patriciaann Clan, DO  Insulin Syringe-Needle U-100 (INSULIN SYRINGE .3CC/31GX5/16") 31G X 5/16" 0.3 ML MISC Use 3 needles per day 06/28/13   Elayne Snare, MD  mometasone (NASONEX) 50 MCG/ACT nasal spray SPRAY 2 SPRAYS INTO THE NOSE DAILY. 03/24/19   Patriciaann Clan, DO  montelukast (SINGULAIR) 10 MG tablet Take 1 tablet (10 mg total) by mouth at bedtime. 07/20/18   Jaynee Eagles, PA-C  PROVENTIL HFA 108 (90 Base) MCG/ACT inhaler INHALE 2 PUFFS INTO THE LUNGS EVERY 6 HOURS AS NEEDED FOR WHEEZING. 05/20/19   Darrelyn Hillock N, DO  TRESIBA FLEXTOUCH 200 UNIT/ML SOPN INJECT 86 UNITS INTO THE SKIN EVERY MORNING. 12/14/18   Beard, Samantha N, DO  TRULICITY 1.5 0000000 SOPN INJECT 1.5MG  INTO THE SKIN EVERY MONDAY. 04/06/19   Patriciaann Clan, DO  mometasone (NASONEX) 50 MCG/ACT nasal spray SPRAY 2 SPRAYS INTO THE NOSE DAILY. 11/30/18   Patriciaann Clan, DO    Allergies    Metformin and related, Peanut-containing drug products, Codeine, Diphenhydramine hcl, Fexofenadine-pseudoephed er, Latuda [lurasidone hcl], and Shellfish allergy  Review of Systems   Review of Systems  Constitutional: Positive for activity  change, appetite change, chills and fatigue.  HENT: Negative.   Respiratory: Positive for cough. Shortness of breath: Intermittent.   Cardiovascular: Positive for chest pain. Negative for palpitations and leg swelling.  Gastrointestinal: Negative.   Genitourinary: Negative.   Musculoskeletal: Negative.   Skin: Negative.   Neurological: Positive for weakness (Generalized). Negative for dizziness, tremors, seizures, syncope, facial asymmetry, speech difficulty, light-headedness, numbness and headaches.  All other systems reviewed and are negative.   Physical Exam Updated Vital Signs BP 106/68    Pulse 66    Temp 98.9 F (37.2 C) (Oral)    Resp 18    SpO2 95%   Physical Exam Vitals and nursing note reviewed.  Constitutional:      General: She is not in acute distress.    Appearance: She is not ill-appearing, toxic-appearing or diaphoretic.  HENT:     Head: Normocephalic and atraumatic.     Jaw: There is normal jaw  occlusion.     Right Ear: Tympanic membrane, ear canal and external ear normal. There is no impacted cerumen. No hemotympanum. Tympanic membrane is not injected, scarred, perforated, erythematous, retracted or bulging.     Left Ear: Tympanic membrane, ear canal and external ear normal. There is no impacted cerumen. No hemotympanum. Tympanic membrane is not injected, scarred, perforated, erythematous, retracted or bulging.     Ears:     Comments: No Mastoid tenderness.    Nose:     Comments: No rhinorrhea and congestion to bilateral nares.  No sinus tenderness.    Mouth/Throat:     Comments: Posterior oropharynx clear.  Mucous membranes moist.  Tonsils without erythema or exudate.  Uvula midline without deviation.  No evidence of PTA or RPA.  No drooling, dysphasia or trismus.  Phonation normal. Neck:     Trachea: Trachea and phonation normal.     Meningeal: Brudzinski's sign and Kernig's sign absent.     Comments: No Neck stiffness or neck rigidity.  No meningismus.  No  cervical lymphadenopathy. Cardiovascular:     Comments: No murmurs rubs or gallops. Pulmonary:     Comments: Clear to auscultation bilaterally without wheeze, rhonchi or rales.  No accessory muscle usage.  Able speak in full sentences. Abdominal:     Comments: Soft, nontender without rebound or guarding.  No CVA tenderness.  Musculoskeletal:     Comments: Moves all 4 extremities without difficulty.  Lower extremities without edema, erythema or warmth.  Skin:    Comments: Brisk capillary refill.  No rashes or lesions.  Neurological:     Mental Status: She is alert.     Comments: Ambulatory in department without difficulty.  Cranial nerves II through XII grossly intact.  No facial droop.  No aphasia.     ED Results / Procedures / Treatments   Labs (all labs ordered are listed, but only abnormal results are displayed) Labs Reviewed  BASIC METABOLIC PANEL - Abnormal; Notable for the following components:      Result Value   Sodium 130 (*)    Chloride 95 (*)    Glucose, Bld 348 (*)    Calcium 8.8 (*)    All other components within normal limits  CBC - Abnormal; Notable for the following components:   RBC 5.28 (*)    All other components within normal limits  CBG MONITORING, ED - Abnormal; Notable for the following components:   Glucose-Capillary 245 (*)    All other components within normal limits  I-STAT BETA HCG BLOOD, ED ([facility], [facility], [facility] ONLY)  TROPONIN I (HIGH SENSITIVITY)  TROPONIN I (HIGH SENSITIVITY)    EKG EKG Interpretation  Date/Time:  Sunday May 23 2019 11:07:03 EDT Ventricular Rate:  83 PR Interval:    QRS Duration: 90 QT Interval:  357 QTC Calculation: 420 R Axis:   16 Text Interpretation: Sinus rhythm No significant change since last tracing Confirmed by Gareth Morgan [phone]) on 05/23/2019 11:10:14 AM   Radiology DG Chest Portable 1 View  Result Date: 05/23/2019 CLINICAL DATA:  COVID, cough, shortness of breath EXAM: PORTABLE CHEST 1 VIEW COMPARISON:   Chest x-ray dated 03/18/2018. FINDINGS: Patchy perihilar and bibasilar airspace opacities, consistent with multifocal pneumonia. No pleural effusion or pneumothorax is seen. Heart size and mediastinal contours are stable. Osseous structures about the chest are unremarkable. IMPRESSION: Patchy perihilar and bibasilar airspace opacities, consistent with multifocal pneumonia, presumably COVID pneumonia. Electronically Signed   By: Franki Cabot M.D.   On: 05/23/2019 10:30  Procedures Procedures (including critical care time)  Medications Ordered in ED Medications  sodium chloride flush (NS) 0.9 % injection 3 mL (3 mLs Intravenous Given 05/23/19 1113)  sodium chloride 0.9 % bolus 1,000 mL (0 mLs Intravenous Stopped 05/23/19 1218)  dexamethasone (DECADRON) injection 8 mg (8 mg Intravenous Given 05/23/19 1115)  insulin aspart (novoLOG) injection 10 Units (10 Units Intravenous Given 05/23/19 1114)   ED Course  I have reviewed the triage vital signs and the nursing notes.  Pertinent labs & imaging results that were available during my care of the patient were reviewed by me and considered in my medical decision making (see chart for details).  49 year old female recently diagnosed with Covid presents for evaluation of Covid symptoms.  She is afebrile, nonseptic, non-ill-appearing.  Patient with intermittent chest pain with coughing, intermittent shortness of breath, myalgias, loss of taste and smell.  She has been taking Best boy which is not help with her cough.  Patient states she has not been using her insulin because she has not had an appetite and has not been eating much.  Her heart and lungs are clear.  Her abdomen is soft, nontender.  Bevelyn Buckles' sign negative.  She has no tachycardia, tachypnea or hypoxia.  She is PERC negative.  Symptoms do not seem consistent with ACS or dissection.  Labs and imaging obtained from triage person reviewed and interpreted: Pregnancy test negative CBC without  leukocytosis, hemoglobin [id number] Metabolic panel with mild hyponatremia at 130, hyperglycemia to 348, no elevated anion gap.  Low suspicion for DKA Troponin 6.  Do not feel any delta troponin at this time.  Symptoms do not seem consistent with ACS and her chest pain is reproducible. Plain film chest shows multifocal pneumonia high suspicion for Covid pneumonia.    EKG without STEMI  Patient reassessed.  Feels significant improvement after steroids, IV fluids.  Patient oxygen saturation 95% in room.  I personally ambulated patient around room with her oxygen saturation was greater than 92% on room air.  No dyspnea on exertion no tachypnea.  Symptoms likely related to Covid.  Low suspicion for ACS, PE, dissection.  No evidence of DKA.  Her CBG is trending down on repeat labs.  Likely elevated blood sugar due to patient not taking her insulin at home.    Thorough discussion with patient for at home steroids.  Discussed concern with her elevated blood sugar here today.  Patient prefers to start the steroids and she will monitor her blood sugar closely at home.  Discussed with patient if blood sugars greater than 250-300 she needs to stop taking the steroids.    Also given symptomatic management for cough and rhinorrhea.  Discussed close follow-up with PCP.  She is ready received outpatient IV therapeutics for her Covid infection.  No hypoxia here in the emergency department.  The patient has been appropriately medically screened and/or stabilized in the ED. I have low suspicion for any other emergent medical condition which would require further screening, evaluation or treatment in the ED or require inpatient management.  Patient is hemodynamically stable and in no acute distress.  Patient able to ambulate in department prior to ED.  Evaluation does not show acute pathology that would require ongoing or additional emergent interventions while in the emergency department or further inpatient treatment.  I  have discussed the diagnosis with the patient and answered all questions.  Pain is been managed while in the emergency department and patient has no further complaints prior to discharge.  Patient is comfortable with plan discussed in room and is stable for discharge at this time.  I have discussed strict return precautions for returning to the emergency department.  Patient was encouraged to follow-up with PCP/specialist refer to at discharge.   MDM Rules/Calculators/A&P                      Margaret Larsen was evaluated in Emergency Department on 05/23/2019 for the symptoms described in the history of present illness. She was evaluated in the context of the global COVID-19 pandemic, which necessitated consideration that the patient might be at risk for infection with the SARS-CoV-2 virus that causes COVID-19. Institutional protocols and algorithms that pertain to the evaluation of patients at risk for COVID-19 are in a state of rapid change based on information released by regulatory bodies including the CDC and federal and state organizations. These policies and algorithms were followed during the patient's care in the ED. Final Clinical Impression(s) / ED Diagnoses Final diagnoses:  COVID-19  SOB (shortness of breath)  Hyperglycemia    Rx / DC Orders ED Discharge Orders         Ordered    fluticasone (FLONASE) 50 MCG/ACT nasal spray  Daily     05/23/19 1305    guaifenesin (ROBITUSSIN) 100 MG/5ML syrup  Every 4 hours PRN     05/23/19 1305    dexamethasone (DECADRON) 6 MG tablet  Daily     05/23/19 1305           Haylen Bellotti A, PA-C 05/23/19 1326    Gareth Morgan, MD 05/24/19 1525

## 2019-05-23 NOTE — ED Triage Notes (Signed)
Pt reports testing pos for covid on Tuesday, c/o chest pain, sob, cough and body aches. resp e/u, nad. Reports not taking insulin due to not feeling well.

## 2019-05-24 ENCOUNTER — Encounter: Payer: Self-pay | Admitting: Family Medicine

## 2019-05-25 ENCOUNTER — Observation Stay (HOSPITAL_COMMUNITY)
Admission: EM | Admit: 2019-05-25 | Discharge: 2019-05-26 | Disposition: A | Discharge status: Home / Self Care | Payer: Self-pay | Attending: Family Medicine | Admitting: Family Medicine

## 2019-05-25 ENCOUNTER — Encounter (HOSPITAL_COMMUNITY): Payer: Self-pay

## 2019-05-25 ENCOUNTER — Emergency Department (HOSPITAL_COMMUNITY): Payer: Self-pay

## 2019-05-25 ENCOUNTER — Encounter: Payer: Self-pay | Admitting: Family Medicine

## 2019-05-25 ENCOUNTER — Other Ambulatory Visit: Payer: Self-pay

## 2019-05-25 ENCOUNTER — Telehealth (INDEPENDENT_AMBULATORY_CARE_PROVIDER_SITE_OTHER): Payer: Self-pay | Admitting: Family Medicine

## 2019-05-25 DIAGNOSIS — U071 COVID-19: Secondary | ICD-10-CM

## 2019-05-25 DIAGNOSIS — Z7982 Long term (current) use of aspirin: Secondary | ICD-10-CM | POA: Insufficient documentation

## 2019-05-25 DIAGNOSIS — Z833 Family history of diabetes mellitus: Secondary | ICD-10-CM | POA: Insufficient documentation

## 2019-05-25 DIAGNOSIS — R443 Hallucinations, unspecified: Secondary | ICD-10-CM

## 2019-05-25 DIAGNOSIS — F05 Delirium due to known physiological condition: Secondary | ICD-10-CM | POA: Diagnosis present

## 2019-05-25 DIAGNOSIS — J1282 Pneumonia due to coronavirus disease 2019: Secondary | ICD-10-CM | POA: Insufficient documentation

## 2019-05-25 DIAGNOSIS — Z794 Long term (current) use of insulin: Secondary | ICD-10-CM | POA: Insufficient documentation

## 2019-05-25 DIAGNOSIS — R441 Visual hallucinations: Secondary | ICD-10-CM

## 2019-05-25 DIAGNOSIS — I10 Essential (primary) hypertension: Secondary | ICD-10-CM | POA: Insufficient documentation

## 2019-05-25 DIAGNOSIS — Z91013 Allergy to seafood: Secondary | ICD-10-CM | POA: Insufficient documentation

## 2019-05-25 DIAGNOSIS — E785 Hyperlipidemia, unspecified: Secondary | ICD-10-CM | POA: Insufficient documentation

## 2019-05-25 DIAGNOSIS — Z885 Allergy status to narcotic agent status: Secondary | ICD-10-CM | POA: Insufficient documentation

## 2019-05-25 DIAGNOSIS — G4733 Obstructive sleep apnea (adult) (pediatric): Secondary | ICD-10-CM | POA: Insufficient documentation

## 2019-05-25 DIAGNOSIS — F319 Bipolar disorder, unspecified: Secondary | ICD-10-CM | POA: Insufficient documentation

## 2019-05-25 DIAGNOSIS — Z888 Allergy status to other drugs, medicaments and biological substances status: Secondary | ICD-10-CM | POA: Insufficient documentation

## 2019-05-25 DIAGNOSIS — Z8249 Family history of ischemic heart disease and other diseases of the circulatory system: Secondary | ICD-10-CM | POA: Insufficient documentation

## 2019-05-25 DIAGNOSIS — E669 Obesity, unspecified: Secondary | ICD-10-CM | POA: Insufficient documentation

## 2019-05-25 DIAGNOSIS — Z79899 Other long term (current) drug therapy: Secondary | ICD-10-CM | POA: Insufficient documentation

## 2019-05-25 DIAGNOSIS — E1142 Type 2 diabetes mellitus with diabetic polyneuropathy: Secondary | ICD-10-CM | POA: Insufficient documentation

## 2019-05-25 DIAGNOSIS — Z91010 Allergy to peanuts: Secondary | ICD-10-CM | POA: Insufficient documentation

## 2019-05-25 LAB — HIV ANTIBODY (ROUTINE TESTING W REFLEX): HIV Screen 4th Generation wRfx: NONREACTIVE

## 2019-05-25 LAB — BASIC METABOLIC PANEL
Anion gap: 13 (ref 5–15)
BUN: 13 mg/dL (ref 6–20)
CO2: 24 mmol/L (ref 22–32)
Calcium: 8.9 mg/dL (ref 8.9–10.3)
Chloride: 100 mmol/L (ref 98–111)
Creatinine, Ser: 0.78 mg/dL (ref 0.44–1.00)
GFR calc Af Amer: >60 mL/min (ref >60)
GFR calc non Af Amer: >60 mL/min (ref >60)
Glucose, Bld: 277 mg/dL — ABNORMAL HIGH (ref 70–99)
Potassium: 3.7 mmol/L (ref 3.5–5.1)
Sodium: 137 mmol/L (ref 135–145)

## 2019-05-25 LAB — FIBRINOGEN: Fibrinogen: >800 mg/dL — ABNORMAL HIGH (ref 210–475)

## 2019-05-25 LAB — CBC
HCT: 43.5 % (ref 36.0–46.0)
Hemoglobin: 14.5 g/dL (ref 12.0–15.0)
MCH: 27.9 pg (ref 26.0–34.0)
MCHC: 33.3 g/dL (ref 30.0–36.0)
MCV: 83.7 fL (ref 80.0–100.0)
Platelets: 437 10*3/uL — ABNORMAL HIGH (ref 150–400)
RBC: 5.2 MIL/uL — ABNORMAL HIGH (ref 3.87–5.11)
RDW: 13.2 % (ref 11.5–15.5)
WBC: 8 10*3/uL (ref 4.0–10.5)
nRBC: 0 % (ref 0.0–0.2)

## 2019-05-25 LAB — CBG MONITORING, ED: Glucose-Capillary: 231 mg/dL — ABNORMAL HIGH (ref 70–99)

## 2019-05-25 LAB — I-STAT BETA HCG BLOOD, ED (MC, WL, AP ONLY): I-stat hCG, quantitative: <5 m[IU]/mL (ref <5)

## 2019-05-25 LAB — FERRITIN: Ferritin: 445 ng/mL — ABNORMAL HIGH (ref 11–307)

## 2019-05-25 LAB — LACTIC ACID, PLASMA
Lactic Acid, Venous: 1.4 mmol/L (ref 0.5–1.9)
Lactic Acid, Venous: 1.4 mmol/L (ref 0.5–1.9)

## 2019-05-25 LAB — TROPONIN I (HIGH SENSITIVITY)
Troponin I (High Sensitivity): 4 ng/L (ref <18)
Troponin I (High Sensitivity): 3 ng/L (ref <18)

## 2019-05-25 LAB — TRIGLYCERIDES: Triglycerides: 148 mg/dL (ref <150)

## 2019-05-25 LAB — D-DIMER, QUANTITATIVE: D-Dimer, Quant: 0.83 ug/mL-FEU — ABNORMAL HIGH (ref 0.00–0.50)

## 2019-05-25 LAB — C-REACTIVE PROTEIN: CRP: 4.2 mg/dL — ABNORMAL HIGH (ref <1.0)

## 2019-05-25 LAB — LACTATE DEHYDROGENASE: LDH: 293 U/L — ABNORMAL HIGH (ref 98–192)

## 2019-05-25 LAB — PROCALCITONIN: Procalcitonin: <0.1 ng/mL

## 2019-05-25 MED ORDER — INSULIN ASPART 100 UNIT/ML ~~LOC~~ SOLN
0.0000 [IU] | Freq: Every day | SUBCUTANEOUS | Status: DC
  Administered 2019-05-25: 2 [IU] via SUBCUTANEOUS

## 2019-05-25 MED ORDER — SODIUM CHLORIDE 0.9% FLUSH: 3.0000 mL | Freq: Once | INTRAVENOUS | Status: DC

## 2019-05-25 MED ORDER — ENOXAPARIN SODIUM 40 MG/0.4ML ~~LOC~~ SOLN
40.0000 mg | SUBCUTANEOUS | Status: DC
  Administered 2019-05-25: 40 mg via SUBCUTANEOUS
  Filled 2019-05-25: qty 0.4

## 2019-05-25 MED ORDER — INSULIN ASPART 100 UNIT/ML ~~LOC~~ SOLN
0.0000 [IU] | Freq: Three times a day (TID) | SUBCUTANEOUS | Status: DC
  Administered 2019-05-26: 5 [IU] via SUBCUTANEOUS
  Administered 2019-05-26: 3 [IU] via SUBCUTANEOUS

## 2019-05-25 NOTE — Progress Notes (Signed)
[facility] Visit  Patient consented to have virtual visit and was identified by name and date of birth. Method of visit: Video  Encounter participants: Patient: Margaret Larsen - located at home Provider: Patriciaann Clan - located at [facility] Others (if applicable): none   Chief Complaint: Still feeling weak, Covid  HPI: Margaret Larsen is a 49 year old female presenting via video to discuss her continued weakness/fatigue related to current Covid illness. She received monoclonal antibody infusion on 5/14.  Seen in the ED on 5/16 due to SOB, no oxygen desaturations at rest or with activity.  CXR showing multifocal pneumonia.  Prescribed short Decadron 6 mg course at that time with Flonase and Robitussin.  Today, she reports she is still feeling "terrible." Still coughing and sputum, Robitussin and Tessalon Perles not helping at all. Feeling very tired and weak. Doesn't feel ready to go back to work. States since yesterday, 5/17, she has been hallucinating. Says she is seeing white rats and bugs climb up and all over her walls, but know they are not there. Reports she "feels mental" and not like herself, confused. Does have a history of bipolar II not currently on medications, had hallucinations in the past only as a drug AE not due to her illness. Hasn't "eaten really anything in 2.5 weeks." Barely drinking for this time as well, can handle some crystal light but other water makes her throat feel like its burning.   ROS: per HPI  Pertinent PMHx: OSA, Uncontrolled T2DM, GERD, HTN, elevated BMI   Exam:  There were no vitals taken for this visit.  General: appears fatigued, intermittently somnolent  HEENT: MMM appear dry, eyes sunken in   Respiratory: Unlabored breathing, speaking in full sentences with occasional wet cough Psych: Speech normal, loud volume. Engages in conversational appropriately, thoughts linear. +visual hallucinations. No SI or HI.     Assessment/Plan:  Visual hallucinations Concern for toxic metabolic encephalopathy/delirium, severe dehydration in the setting of COVID-19 illness.  While it is additionally possible her current infection could be triggering visual hallucinations with her underlying bipolar disorder, suspect this is less likely as she has not had hallucinations as part of her illness in the past.  Recommended in-person evaluation in the ED.   COVID-19 On approximately day 11 of illness, worsening.  S/p monoclonal antibody infusion on 5/14 due to high risk status with concurrent uncontrolled diabetes and elevated BMI.  Recommended reevaluation in the ED as discussed above and agreed with continued quarantine.    Discussed case with Dr. Andria Frames.  Time spent during visit with patient: 20 minutes  Pending ED evaluation, schedule patient with virtual follow-up visit on Friday, 5/21, at 11:10 AM.  Can cancel if needed or set up follow-up sooner.  Patriciaann Clan, DO

## 2019-05-25 NOTE — H&P (Addendum)
[facility] Admission History and Physical Service Pager: [phone]  Patient name: Margaret Larsen Medical record number: [id number] Date of birth: 1970/12/14 Age: 49 y.o. Gender: female  Primary Care Provider: Patriciaann Clan, DO Consultants: none Code Status: full code Preferred Emergency Contact: Arnetha Gula [phone]  Chief Complaint: COVID-19 pneumonia and possible hallucinations  Assessment and Plan: Margaret Larsen is a 49 y.o. female presenting with >1 week coughing, sore throat, nausea, and vomiting. PMH is significant for DMT2, HTN, HLD, obesity, bipolar disorder.  COVID-19  visual hallucination  Patient initially became symptomatic on 05/15/19 and tested positive for COVID-19 on 05/18/19. Patient has been afebrile, with cough, sore throat, chills, body aches, and lack of taste and smell. She has not been hypoxic. She has visited the ED on 5/16 and today, 5/18. She received bamlanivimab infusion on 5/14, as well as IV decadron on 5/16. Patient was given prescription for decadron and took only 1 pill on 5/17 due to elevated blood sugars. She has used robitussin and tessalon perles without relief. Today she presents to the ED after a telehealth visit with [facility] where she indicated she has begun having hallucinations. Patient has generalized malaise, afebrile, chills, cough, sore throat, nausea and vomiting. She denies CP, SOB, and dizziness. Patient states she has not been able to eat much in the last 2.5 weeks, but has been able to drink crystal light without problem. She states that water makes her throat burn. VSS, only mild hypertension and tachypnea to mid 20s; SpO2 is stable >90% O2 on RA. Remained >94% with ambulation ORA. BMP is WNL except for elevated glucose to 277, CBC is WNL except mildly elevated platelets to 437. Inflammatory markers are elevated, as expected: LDH 293, ferritin 445, CRP 4.2, D-dimer 0.83, fibrinogen >800. Of note, LA is  WNL, and procalcitonin is <0.1, supporting that this is likely viral and not bacterial. Troponin WNL at 4, lessening concern for cardiac involvement. CXR shows a mild amount of infiltrate or atelectasis consistent with mild COVID-19 pneumonia. On exam patient looks ill-appearing, but no acute distress. Her pulmonary exam has mild end-expiratory wheeze, but good air flow in all lung fields. She is alert and oriented on exam, and states that she has seen "things that aren't there" on two separate occasions since her ED visit on 5/16. On Monday, 5/17, she states that after receiving the steroids, several hours later at home she saw "shadows on the wall- like bugs, but we don't have bugs." She also says she saw "little white rats." Patient states that she has never had anything like this before, and the scariest part of seeing this is that she knows they aren't there. Currently patient is not a candidate for remdesivir as she has not required oxygen supplementation, and Remdesivir is indicated for severe cases with hypoxia. Steroids can cause psychosis, but patient had 1 dose of IV steroids and one dose of oral steroids, which is less likely to cause hallucinations. bamlanimivab unknown to cause CNS effect- confirmed with pharmacy. Patients can become confused and have delirium when ill, which could be contributing in this case. However, patient seems to be at her baseline currently. No known h/o drug use. UDS pending. No focal deficits, dizziness, headaches which make stroke less likely for abnormal visualizations. Because patient is more than a week out since onset of symptoms and vital signs are stable, as well as potential delirium/psychosis, will hold off with treating patient with steroids tonight. Plan is to monitor  patient overnight for further hallucinations and vital sign instability. -Admit to med-surg, FPTS, attending Dr. Gwendlyn Deutscher -Airborne and contact precautions -Continuous cardiac and pulse oximetry -O2  therapy to maintain SpO2 >90% PRN -F/u troponin, LA, UDS -AM BMP, CBC, d-dimer, ferritin, CRP - f/u blood culture -Lovenox for DVT ppx -Carb modified diet -Vitals per floor routine - delirium precautions   DMT2 Patient has history of DMT2; las Hgb A1c was 11% on 05/14/19, not well controlled. Blood sugar elevated to 277 today. Patient states that she only took 1 pill of steroids at home because of elevated blood sugars. She also states that she has only been able to drink crystal light and has not been able to keep food down. On lab work, glucose is elevated, but no acidosis or dehydration. Home medication includes: novolog 40U daily, tresiba [id number] qAM, trulicity 1.5mg  qMonday, jardiance 10 mg daily, and ASA. It appears ASA is for primary prevention. -Hold all home medications except ASA -CBG checks AC and qhs - 0600 fasting glucose to assess for basal dose of insulin for the day -mSSI AC+HS  Diabetic neuropathy Home medication gabapentin 600 mg TID.  -hold home medication  HTN Patient has h/o htn, mildly elevated BP in ED while acutely ill 120s-130s/80s-90s. Most recently 126/97. It appears that patient is not currently on BP medication. Recommend outpatient follow up as patient may benefit from ACE/ARB for DMT2. -Continue to monitor  HLD Patient has diabetes, not currently on statin. Patient has had myalgias in the past (2016) and not been adherent with statin treatment. Recommend follow up outpatient to assess this and possibly restart. -Follow up outpatient  FEN/GI: Carb modified  Prophylaxis: Lovenox  Disposition: for observation to med-surg  History of Present Illness:  Margaret Larsen is a 49 y.o. female presenting with COVID-19 pneumonia with cough, sore throat, chills, nausea and vomiting. Patient has had symptoms since May 8th, tested positive on May 11th. She had the monoclonal antibody on 5/14 and IV decadron x1 on 5/16, no hypoxia. After that ED visit she took 1 dose of  steroids po and stopped due to elevated blood sugar. She also started having "episodes" of seeing bugs and rats. Seeing white rats crawling up walls in her room. Then described it as moving shadows on the walls. It occurred twice. Not sure how long it lasted. She just went to sleep and it was gone when she woke up. Did not have any tactile or auditory hallucinations. She is currently not seeing anything else, and this has never happened to her before. Patient has been able to drink, prefers crystal light. She states that she hasn't eaten in 2.5 weeks. Denies abdominal pain, SOB, or CP.  Review Of Systems: Per HPI with the following additions:   Review of Systems  Constitutional: Positive for chills and fatigue. Negative for fever.  HENT: Positive for sore throat and voice change. Negative for congestion and sneezing.   Eyes: Positive for visual disturbance.  Respiratory: Positive for cough. Negative for apnea, choking, chest tightness, shortness of breath and wheezing.   Cardiovascular: Negative for chest pain.  Gastrointestinal: Positive for nausea and vomiting. Negative for abdominal distention, abdominal pain, constipation and diarrhea.  Genitourinary: Negative for dysuria, frequency and urgency.  Musculoskeletal: Negative for arthralgias.  Neurological: Negative for dizziness.  Psychiatric/Behavioral: Positive for confusion and hallucinations.    Patient Active Problem List   Diagnosis Date Noted  . Visual hallucinations 05/25/2019  . Delirium due to another medical condition 05/25/2019  .  COVID-19 05/20/2019  . Healthcare maintenance 05/19/2019  . Muscle fatigue 05/18/2019  . Left upper extremity swelling 12/08/2018  . Sore throat 03/13/2017  . Vertigo 06/21/2016  . Abscess of gluteal region 04/19/2016  . Panic attack 02/06/2016  . Hair loss 12/13/2015  . Allergic rhinitis 04/25/2015  . Hyperlipidemia 11/03/2014  . 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 Atypical squamous cells  of undetermined significance (ASCUS) on Papanicolaou smear of cervix 03/22/2013  . At risk for healthcare associated infection 03/15/2013  . GERD (gastroesophageal reflux disease) 11/13/2012  . Glaucoma suspect of both eyes 06/25/2012  . OSA (obstructive sleep apnea) 10/18/2010  . Diarrhea 02/13/2010  . Diabetic peripheral neuropathy ([place]) 01/23/2010  . PERSONAL HISTORY OF ALLERGY TO PEANUTS 11/02/2009  . PTSD 07/31/2009  . Uncontrolled type 2 diabetes mellitus with complication ([place]) 123456  . HYPERTENSION, BENIGN ESSENTIAL 11/12/2007  . DYSLIPIDEMIA 10/02/2007  . Bipolar disorder ([place]) 08/11/2007  . OBESITY, NOS 03/06/2006   Past Medical History: Past Medical History:  Diagnosis Date  . Cough 05/13/2016  . Diabetes mellitus   . Hemorrhoids 07/10/2010  . Hypertension   . Kidney stones   . Kidney stones   . Myalgia and myositis 06/19/2015  . Renal disorder    Past Surgical History: Past Surgical History:  Procedure Laterality Date  . CESAREAN SECTION    . CHOLECYSTECTOMY    . fiborids removed    . TUBAL LIGATION     Social History: Social History   Tobacco Use  . Smoking status: Never Smoker  . Smokeless tobacco: Never Used  Substance Use Topics  . Alcohol use: No  . Drug use: No   Additional social history: significant other is taking care of children, he is vaccinated Please also refer to relevant sections of EMR.  Family History: Family History  Problem Relation Age of Onset  . Hypertension Mother   . Diabetes Mother   . Cancer Mother    Allergies and Medications: Allergies  Allergen Reactions  . Metformin And Related Hives and Diarrhea    Unable to tolerate  . Peanut-Containing Drug Products Shortness Of Breath and Swelling    mouth swelling  . Codeine Other (See Comments)    Patient stated that it slowed her heart rate, shortness of breath  . Diphenhydramine Hcl Hives and Rash  . Fexofenadine-Pseudoephed Er Other (See Comments)    Patient stated  that is slowed her heart rate  . Latuda [Lurasidone Hcl] Other (See Comments)    Myalgias all over, most notably in her arms and legs.  . Shellfish Allergy Itching   No current facility-administered medications on file prior to encounter.   Current Outpatient Medications on File Prior to Encounter  Medication Sig Dispense Refill  . ACCU-CHEK FASTCLIX LANCETS MISC 1 each by Does not apply route 3 (three) times daily. ICD 9 250.92 102 each 11  . aspirin 81 MG EC tablet Take 81 mg by mouth daily. Reported on 05/19/2015    . benzonatate (TESSALON) 200 MG capsule Take 1 capsule (200 mg total) by mouth 2 (two) times daily as needed for cough. 20 capsule 0  . Blood Glucose Monitoring Suppl (AGAMATRIX PRESTO PRO METER) DEVI 1 Device by Does not apply route once. 1 Device 0  . cetirizine (ZYRTEC) 10 MG tablet Take 1 tablet (10 mg total) by mouth daily. 30 tablet 12  . glucose blood (AGAMATRIX PRESTO TEST) test strip Use as instructed 50 each prn  . insulin aspart (NOVOLOG FLEXPEN) 100 UNIT/ML FlexPen  Inject 40 Units into the skin daily. 15 mL 3  . Insulin Syringe-Needle U-100 (INSULIN SYRINGE .3CC/31GX5/16") 31G X 5/16" 0.3 ML MISC Use 3 needles per day 100 each 3  . mometasone (NASONEX) 50 MCG/ACT nasal spray SPRAY 2 SPRAYS INTO THE NOSE DAILY. (Patient taking differently: Place 2 sprays into the nose daily. ) 17 g 2  . TRESIBA FLEXTOUCH 200 UNIT/ML SOPN INJECT 86 UNITS INTO THE SKIN EVERY MORNING. (Patient taking differently: Inject 86 Units into the skin daily. ) 24 mL 1  . TRULICITY 1.5 0000000 SOPN INJECT 1.5MG  INTO THE SKIN EVERY MONDAY. (Patient taking differently: Inject 1.5 mg into the skin once a week. INJECT 1.5MG  INTO THE SKIN EVERY MONDAY.) 4 mL 2  . dexamethasone (DECADRON) 6 MG tablet Take 1 tablet (6 mg total) by mouth daily. (Patient not taking: Reported on 05/25/2019) 4 tablet 0  . empagliflozin (JARDIANCE) 10 MG TABS tablet Take 10 mg by mouth daily. (Patient not taking: Reported on  05/25/2019) 30 tablet 0  . EPINEPHrine (EPI-PEN) 0.3 mg/0.3 mL DEVI Inject 0.3 mLs (0.3 mg total) into the muscle as needed. For allergic reaction (Patient not taking: Reported on 11/02/2015) 1 Device 0  . fluticasone (FLONASE) 50 MCG/ACT nasal spray Place 2 sprays into both nostrils daily. (Patient not taking: Reported on 05/25/2019) 11.1 mL 0  . gabapentin (NEURONTIN) 300 MG capsule Take 2 capsules (600 mg total) by mouth 3 (three) times daily. (Patient not taking: Reported on 05/25/2019) 180 capsule 2  . guaifenesin (ROBITUSSIN) 100 MG/5ML syrup Take 5-10 mLs (100-200 mg total) by mouth every 4 (four) hours as needed for cough. (Patient not taking: Reported on 05/25/2019) 60 mL 0  . montelukast (SINGULAIR) 10 MG tablet Take 1 tablet (10 mg total) by mouth at bedtime. (Patient not taking: Reported on 05/25/2019) 90 tablet 0  . PROVENTIL HFA 108 (90 Base) MCG/ACT inhaler INHALE 2 PUFFS INTO THE LUNGS EVERY 6 HOURS AS NEEDED FOR WHEEZING. (Patient not taking: Reported on 05/25/2019) 18 g 1  . [DISCONTINUED] mometasone (NASONEX) 50 MCG/ACT nasal spray SPRAY 2 SPRAYS INTO THE NOSE DAILY. 17 g 0    Objective: BP (!) 126/97   Pulse 90   Temp 98.4 F (36.9 C) (Oral)   Resp 19   SpO2 96%  Exam: General: middle-aged, AA woman resting in bed, ill-appearing, NAD Eyes: anicteric sclerae ENTM: clear oropharynx Neck: no LAD Cardiovascular: RRR, no m/r/g Respiratory: mild end-expiratory wheeze in anterior lung fields, CTAB, good air movement in all lung fields, no increased WOB  Gastrointestinal: soft, NT, ND, normal bowel sounds + MSK: warm, dry, no edema Derm: no rashes or lesions  Neuro: alert and oriented x3, no focal deficits Psych: normal mood, reporting possible hallucinations  Labs and Imaging: CBC BMET  Recent Labs  Lab 05/25/19 1843  WBC 8.0  HGB 14.5  HCT 43.5  PLT 437*   Recent Labs  Lab 05/25/19 1843  NA 137  K 3.7  CL 100  CO2 24  BUN 13  CREATININE 0.78  GLUCOSE 277*   CALCIUM 8.9     EKG: EKG Interpretation  Date/Time:  Tuesday May 25 2019 17:49:10 EDT Ventricular Rate:  100 PR Interval:  148 QRS Duration: 76 QT Interval:  334 QTC Calculation: 430 R Axis:   12 Text Interpretation: Normal sinus rhythm Possible Anterior infarct , age undetermined Abnormal ECG No significant change since last tracing Confirmed by Dorie Rank [phone]) on 05/25/2019 5:57:52 PM  DG Chest Portable 1 View  Result Date: 05/25/2019 CLINICAL DATA:  Shortness of breath, tested positive for COVID on May 18, 2019. EXAM: PORTABLE CHEST 1 VIEW COMPARISON:  May 23, 2019 FINDINGS: Mild diffuse chronic appearing increased lung markings are seen. A trace amount of residual atelectasis and/or infiltrate is seen within the bilateral lung bases. There is no evidence of a pleural effusion or pneumothorax. The heart size and mediastinal contours are within normal limits. The visualized skeletal structures are unremarkable. IMPRESSION: Trace amount of residual bibasilar atelectasis and/or infiltrate. Electronically Signed   By: Virgina Norfolk M.D.   On: 05/25/2019 18:29   Gladys Damme, MD 05/25/2019, 10:45 PM PGY-1, [place] Intern pager: [phone], text pages welcome  [place]    I have seen and examined this patient.     I have discussed the findings and exam with the intern and agree with the above note, which I have edited appropriately in [place]. I helped develop the management plan that is described in the resident's note, and I agree with the content.   Doristine Mango, DO PGY-2 Family Medicine Resident

## 2019-05-25 NOTE — ED Provider Notes (Signed)
[facility] EMERGENCY DEPARTMENT Provider Note   CSN: [id number] Arrival date & time: 05/25/19  1740     History Chief Complaint  Patient presents with  . Shortness of Breath    Margaret Larsen is a 49 y.o. female.  HPI   Pt tested positive for covid on Tuesday.  .  Pt does not have much of an appetite.  She has not been drinking much.  She has continued to cough.  She has been feeling very weak and has had generalized malaise.  Pt had been seen in the ED on May 16.  She had been given steroids.  She also received monoclonal antibody on 5/14.  She does not feel like she is getting better.  Patient also feels like she is hallucinating and has been seeing rats and bugs climbing on the walls over the last couple of days.  Patient had a video visit with her doctor today.  Past Medical History:  Diagnosis Date  . Cough 05/13/2016  . Diabetes mellitus   . Hemorrhoids 07/10/2010  . Hypertension   . Kidney stones   . Kidney stones   . Myalgia and myositis 06/19/2015  . Renal disorder     Patient Active Problem List   Diagnosis Date Noted  . Visual hallucinations 05/25/2019  . COVID-19 05/20/2019  . Healthcare maintenance 05/19/2019  . Muscle fatigue 05/18/2019  . Left upper extremity swelling 12/08/2018  . Sore throat 03/13/2017  . Vertigo 06/21/2016  . Abscess of gluteal region 04/19/2016  . Panic attack 02/06/2016  . Hair loss 12/13/2015  . Allergic rhinitis 04/25/2015  . Hyperlipidemia 11/03/2014  . 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 Atypical squamous cells of undetermined significance (ASCUS) on Papanicolaou smear of cervix 03/22/2013  . At risk for healthcare associated infection 03/15/2013  . GERD (gastroesophageal reflux disease) 11/13/2012  . Glaucoma suspect of both eyes 06/25/2012  . OSA (obstructive sleep apnea) 10/18/2010  . Diarrhea 02/13/2010  . Diabetic peripheral neuropathy ([place]) 01/23/2010  . PERSONAL HISTORY OF ALLERGY TO PEANUTS  11/02/2009  . PTSD 07/31/2009  . Uncontrolled type 2 diabetes mellitus with complication ([place]) 123456  . HYPERTENSION, BENIGN ESSENTIAL 11/12/2007  . DYSLIPIDEMIA 10/02/2007  . Bipolar disorder ([place]) 08/11/2007  . OBESITY, NOS 03/06/2006    Past Surgical History:  Procedure Laterality Date  . CESAREAN SECTION    . CHOLECYSTECTOMY    . fiborids removed    . TUBAL LIGATION       OB History   No obstetric history on file.     Family History  Problem Relation Age of Onset  . Hypertension Mother   . Diabetes Mother   . Cancer Mother     Social History   Tobacco Use  . Smoking status: Never Smoker  . Smokeless tobacco: Never Used  Substance Use Topics  . Alcohol use: No  . Drug use: No    Home Medications Prior to Admission medications   Medication Sig Start Date End Date Taking? Authorizing Provider  ACCU-CHEK FASTCLIX LANCETS MISC 1 each by Does not apply route 3 (three) times daily. ICD 9 250.92 06/29/13   Boykin Nearing, MD  aspirin 81 MG EC tablet Take 81 mg by mouth daily. Reported on 05/19/2015    [provider]  benzonatate (TESSALON) 200 MG capsule Take 1 capsule (200 mg total) by mouth 2 (two) times daily as needed for cough. 05/19/19   Patriciaann Clan, DO  Blood Glucose Monitoring Suppl (AGAMATRIX PRESTO  PRO METER) DEVI 1 Device by Does not apply route once. 06/30/14   Zenia Resides, MD  cetirizine (ZYRTEC) 10 MG tablet Take 1 tablet (10 mg total) by mouth daily. 07/13/18   Patriciaann Clan, DO  dexamethasone (DECADRON) 6 MG tablet Take 1 tablet (6 mg total) by mouth daily. 05/23/19   Henderly, Britni A, PA-C  empagliflozin (JARDIANCE) 10 MG TABS tablet Take 10 mg by mouth daily. 05/14/19   Patriciaann Clan, DO  EPINEPHrine (EPI-PEN) 0.3 mg/0.3 mL DEVI Inject 0.3 mLs (0.3 mg total) into the muscle as needed. For allergic reaction Patient not taking: Reported on 11/02/2015 08/05/11   Katherina Mires, MD  fluticasone [facility]) 50 MCG/ACT nasal spray  Place 2 sprays into both nostrils daily. 05/23/19   Henderly, Britni A, PA-C  gabapentin (NEURONTIN) 300 MG capsule Take 2 capsules (600 mg total) by mouth 3 (three) times daily. 06/09/18   Lovenia Kim, MD  glucose blood (AGAMATRIX PRESTO TEST) test strip Use as instructed 05/14/19   Patriciaann Clan, DO  guaifenesin (ROBITUSSIN) 100 MG/5ML syrup Take 5-10 mLs (100-200 mg total) by mouth every 4 (four) hours as needed for cough. 05/23/19   Henderly, Britni A, PA-C  insulin aspart (NOVOLOG FLEXPEN) 100 UNIT/ML FlexPen Inject 40 Units into the skin daily. 05/20/19   Patriciaann Clan, DO  Insulin Syringe-Needle U-100 (INSULIN SYRINGE .3CC/31GX5/16") 31G X 5/16" 0.3 ML MISC Use 3 needles per day 06/28/13   Elayne Snare, MD  mometasone (NASONEX) 50 MCG/ACT nasal spray SPRAY 2 SPRAYS INTO THE NOSE DAILY. 03/24/19   Patriciaann Clan, DO  montelukast (SINGULAIR) 10 MG tablet Take 1 tablet (10 mg total) by mouth at bedtime. 07/20/18   Jaynee Eagles, PA-C  PROVENTIL HFA 108 (90 Base) MCG/ACT inhaler INHALE 2 PUFFS INTO THE LUNGS EVERY 6 HOURS AS NEEDED FOR WHEEZING. 05/20/19   Patriciaann Clan, DO  TRESIBA FLEXTOUCH 200 UNIT/ML SOPN INJECT 86 UNITS INTO THE SKIN EVERY MORNING. 12/14/18   Beard, Samantha N, DO  TRULICITY 1.5 0000000 SOPN INJECT 1.5MG  INTO THE SKIN EVERY MONDAY. 04/06/19   Patriciaann Clan, DO  mometasone (NASONEX) 50 MCG/ACT nasal spray SPRAY 2 SPRAYS INTO THE NOSE DAILY. 11/30/18   Patriciaann Clan, DO    Allergies    Metformin and related, Peanut-containing drug products, Codeine, Diphenhydramine hcl, Fexofenadine-pseudoephed er, Latuda [lurasidone hcl], and Shellfish allergy  Review of Systems   Review of Systems  All other systems reviewed and are negative.   Physical Exam Updated Vital Signs BP (!) 121/93   Pulse 92   Temp 98.4 F (36.9 C) (Oral)   Resp 16   SpO2 98%   Physical Exam Vitals and nursing note reviewed.  Constitutional:      General: She is not in acute distress.     Appearance: She is well-developed. She is ill-appearing.  HENT:     Head: Normocephalic and atraumatic.     Right Ear: External ear normal.     Left Ear: External ear normal.  Eyes:     General: No scleral icterus.       Right eye: No discharge.        Left eye: No discharge.     Conjunctiva/sclera: Conjunctivae normal.  Neck:     Trachea: No tracheal deviation.  Cardiovascular:     Rate and Rhythm: Normal rate and regular rhythm.  Pulmonary:     Effort: Pulmonary effort is normal. No respiratory distress.  Breath sounds: No stridor. Rales present. No wheezing.  Abdominal:     General: Bowel sounds are normal. There is no distension.     Palpations: Abdomen is soft.     Tenderness: There is no abdominal tenderness. There is no guarding or rebound.  Musculoskeletal:        General: No tenderness.     Cervical back: Neck supple.     Right lower leg: No edema.     Left lower leg: No edema.  Skin:    General: Skin is warm and dry.     Findings: No rash.  Neurological:     Mental Status: She is alert.     Cranial Nerves: No cranial nerve deficit (no facial droop, extraocular movements intact, no slurred speech).     Sensory: No sensory deficit.     Motor: No abnormal muscle tone or seizure activity.     Coordination: Coordination normal.     ED Results / Procedures / Treatments   Labs (all labs ordered are listed, but only abnormal results are displayed) Labs Reviewed  BASIC METABOLIC PANEL - Abnormal; Notable for the following components:      Result Value   Glucose, Bld 277 (*)    All other components within normal limits  CBC - Abnormal; Notable for the following components:   RBC 5.20 (*)    Platelets 437 (*)    All other components within normal limits  D-DIMER, QUANTITATIVE (NOT AT [facility]) - Abnormal; Notable for the following components:   D-Dimer, Quant 0.83 (*)    All other components within normal limits  LACTATE DEHYDROGENASE - Abnormal; Notable for the  following components:   LDH 293 (*)    All other components within normal limits  FERRITIN - Abnormal; Notable for the following components:   Ferritin 445 (*)    All other components within normal limits  FIBRINOGEN - Abnormal; Notable for the following components:   Fibrinogen >800 (*)    All other components within normal limits  C-REACTIVE PROTEIN - Abnormal; Notable for the following components:   CRP 4.2 (*)    All other components within normal limits  CULTURE, BLOOD (ROUTINE X 2)  CULTURE, BLOOD (ROUTINE X 2)  LACTIC ACID, PLASMA  PROCALCITONIN  TRIGLYCERIDES  LACTIC ACID, PLASMA  I-STAT BETA HCG BLOOD, ED ([facility], [facility], [facility] ONLY)  TROPONIN I (HIGH SENSITIVITY)  TROPONIN I (HIGH SENSITIVITY)    EKG EKG Interpretation  Date/Time:  Tuesday May 25 2019 17:49:10 EDT Ventricular Rate:  100 PR Interval:  148 QRS Duration: 76 QT Interval:  334 QTC Calculation: 430 R Axis:   12 Text Interpretation: Normal sinus rhythm Possible Anterior infarct , age undetermined Abnormal ECG No significant change since last tracing Confirmed by Dorie Rank [phone]) on 05/25/2019 5:57:52 PM   Radiology DG Chest Portable 1 View  Result Date: 05/25/2019 CLINICAL DATA:  Shortness of breath, tested positive for COVID on May 18, 2019. EXAM: PORTABLE CHEST 1 VIEW COMPARISON:  May 23, 2019 FINDINGS: Mild diffuse chronic appearing increased lung markings are seen. A trace amount of residual atelectasis and/or infiltrate is seen within the bilateral lung bases. There is no evidence of a pleural effusion or pneumothorax. The heart size and mediastinal contours are within normal limits. The visualized skeletal structures are unremarkable. IMPRESSION: Trace amount of residual bibasilar atelectasis and/or infiltrate. Electronically Signed   By: Virgina Norfolk M.D.   On: 05/25/2019 18:29    Procedures Procedures (including critical care time)  Medications Ordered in ED Medications  sodium chloride flush (NS)  0.9 % injection 3 mL (3 mLs Intravenous Not Given 05/25/19 1915)    ED Course  I have reviewed the triage vital signs and the nursing notes.  Pertinent labs & imaging results that were available during my care of the patient were reviewed by me and considered in my medical decision making (see chart for details).    MDM Rules/Calculators/A&P                      Patient presents ED with worsening symptoms associated with Covid.  Patient has been treated with outpatient steroids and she was also given monoclonal antibody treatment.  She continues to feel worse.  Patient does not have an oxygen requirement right now but she started having hallucinations.  No signs of acute electrolyte abnormalities.  No findings to suggest encephalitis.  This does not seem to be psychiatric in nature.  Its possible this may be related to her steroid use.  Considering her continued worsening symptoms I will consult with the family medicine service to see if would be reasonable to bring her in for IV remdesivir. Final Clinical Impression(s) / ED Diagnoses Final diagnoses:  Pneumonia due to COVID-19 virus  Hallucination      Dorie Rank, MD 05/25/19 2108

## 2019-05-25 NOTE — Assessment & Plan Note (Signed)
Concern for toxic metabolic encephalopathy/delirium, severe dehydration in the setting of COVID-19 illness.  While it is additionally possible her current infection could be triggering visual hallucinations with her underlying bipolar disorder, suspect this is less likely as she has not had hallucinations as part of her illness in the past.  Recommended in-person evaluation in the ED.

## 2019-05-25 NOTE — ED Triage Notes (Signed)
Pt arrives to ED w/ c/o sob, tested positive for covid on 5/11. Pt denies chest pain.

## 2019-05-25 NOTE — Discharge Summary (Signed)
[facility] Discharge Summary  Patient name: Margaret Larsen Medical record number: [id number] Date of birth: 04-17-1970 Age: 49 y.o. Gender: female Date of Admission: 05/25/2019  Date of Discharge: 05/25/19 Admitting Physician: Gladys Damme, MD  Primary Care Provider: Patriciaann Clan, DO Consultants: none  Indication for Hospitalization: hallucinations  Discharge Diagnoses/Problem List:  Visual hallucinations COVID-19 HTN DM2  Disposition: Home  Discharge Condition: Medically stable for discharge   Discharge Exam:  General: No acute distress, alert, 49 yr old female Cardiovascular: S1 and S2 present, RRR Respiratory: CTAB, normal WOB  Abdomen: soft non tender, bowel sounds present  Extremities: no peripheral   Brief Hospital Course:  Patient presented due to ongoing mild COVID-19 pneumonia without hypoxia. She was treated as outpatient with decadron x 2 doses and bamlanimivab x1. Her biggest concern was a 2x occurrence of visual hallucinations of shadows of rats on her walls at home at admission. She did not experience visual or auditory to hallucinations in hospital.  It is likely that the symptoms were secondary to steroid use. She remained stable on room air. She was discharged home on 5/19.  Issues for Follow Up:  1. F/u with [facility] on 5/21 at 11:10am.  Significant Procedures: none  Significant Labs and Imaging:  Recent Labs  Lab 05/23/19 0908 05/25/19 1843 05/26/19 0700  WBC 7.0 8.0 7.3  HGB 14.6 14.5 14.1  HCT 43.7 43.5 41.9  PLT 346 437* 456*   Recent Labs  Lab 05/23/19 0908 05/23/19 0908 05/25/19 1843 05/26/19 0700  NA 130*  --  137 137  K 4.1   < > 3.7 3.5  CL 95*  --  100 103  CO2 26  --  24 22  GLUCOSE 348*  --  277* 204*  BUN 6  --  13 10  CREATININE 0.80  --  0.78 0.64  CALCIUM 8.8*  --  8.9 8.8*  ALKPHOS  --   --   --  84  AST  --   --   --  22  ALT  --   --   --  17  ALBUMIN  --   --   --  2.8*   < > =  values in this interval not displayed.    Results/Tests Pending at Time of Discharge:  n/a  Discharge Medications:  Allergies as of 05/26/2019      Reactions   Metformin And Related Hives, Diarrhea   Unable to tolerate   Peanut-containing Drug Products Shortness Of Breath, Swelling   mouth swelling   Codeine Other (See Comments)   Patient stated that it slowed her heart rate, shortness of breath   Diphenhydramine Hcl Hives, Rash   Fexofenadine-pseudoephed Er Other (See Comments)   Patient stated that is slowed her heart rate   Latuda [lurasidone Hcl] Other (See Comments)   Myalgias all over, most notably in her arms and legs.   Shellfish Allergy Itching      Medication List    STOP taking these medications   dexamethasone 6 MG tablet Commonly known as: DECADRON     TAKE these medications   Accu-Chek FastClix Lancets Misc 1 each by Does not apply route 3 (three) times daily. ICD 9 250.92   AgaMatrix Presto Pro Meter Devi 1 Device by Does not apply route once.   AgaMatrix Presto Test test strip Generic drug: glucose blood Use as instructed   aspirin 81 MG EC tablet Take 81 mg by mouth daily. Reported  on 05/19/2015   benzonatate 200 MG capsule Commonly known as: TESSALON Take 1 capsule (200 mg total) by mouth 2 (two) times daily as needed for cough.   cetirizine 10 MG tablet Commonly known as: ZYRTEC Take 1 tablet (10 mg total) by mouth daily.   empagliflozin 10 MG Tabs tablet Commonly known as: JARDIANCE Take 10 mg by mouth daily.   EPINEPHrine 0.3 mg/0.3 mL Devi Commonly known as: EPI-PEN Inject 0.3 mLs (0.3 mg total) into the muscle as needed. For allergic reaction   fluticasone 50 MCG/ACT nasal spray Commonly known as: FLONASE Place 2 sprays into both nostrils daily.   gabapentin 300 MG capsule Commonly known as: NEURONTIN Take 2 capsules (600 mg total) by mouth 3 (three) times daily.   guaifenesin 100 MG/5ML syrup Commonly known as:  ROBITUSSIN Take 5-10 mLs (100-200 mg total) by mouth every 4 (four) hours as needed for cough.   INSULIN SYRINGE .3CC/31GX5/16" 31G X 5/16" 0.3 ML Misc Use 3 needles per day   mometasone 50 MCG/ACT nasal spray Commonly known as: Nasonex SPRAY 2 SPRAYS INTO THE NOSE DAILY. What changed:   how much to take  how to take this  when to take this  additional instructions   montelukast 10 MG tablet Commonly known as: SINGULAIR Take 1 tablet (10 mg total) by mouth at bedtime.   NovoLOG FlexPen 100 UNIT/ML FlexPen Generic drug: insulin aspart Inject 40 Units into the skin daily.   Proventil HFA 108 (90 Base) MCG/ACT inhaler Generic drug: albuterol INHALE 2 PUFFS INTO THE LUNGS EVERY 6 HOURS AS NEEDED FOR WHEEZING.   Tyler Aas FlexTouch 200 UNIT/ML FlexTouch Pen Generic drug: insulin degludec INJECT 86 UNITS INTO THE SKIN EVERY MORNING. What changed: when to take this   Trulicity 1.5 0000000 Sopn Generic drug: Dulaglutide INJECT 1.5MG  INTO THE SKIN EVERY MONDAY. What changed: See the new instructions.       Discharge Instructions: Please refer to Patient Instructions section of EMR for full details.  Patient was counseled important signs and symptoms that should prompt return to medical care, changes in medications, dietary instructions, activity restrictions, and follow up appointments.   Follow-Up Appointments: Follow-up Information    Patriciaann Clan, DO. Go on 05/28/2019.   Specialty: Family Medicine Why: My chart visit ccess to pool appointment on 5/21 at 11.10am.  Contact information: [address] [address] [phone]           Lattie Haw, MD 05/27/2019, 4:34 PM PGY-1, [place]

## 2019-05-25 NOTE — ED Notes (Signed)
PT ambulated with strong gait O2 stayed above 94

## 2019-05-25 NOTE — Assessment & Plan Note (Addendum)
On approximately day 11 of illness, worsening.  S/p monoclonal antibody infusion on 5/14 due to high risk status with concurrent uncontrolled diabetes and elevated BMI.  Recommended reevaluation in the ED as discussed above and agreed with continued quarantine.

## 2019-05-26 LAB — COMPREHENSIVE METABOLIC PANEL
ALT: 17 U/L (ref 0–44)
AST: 22 U/L (ref 15–41)
Albumin: 2.8 g/dL — ABNORMAL LOW (ref 3.5–5.0)
Alkaline Phosphatase: 84 U/L (ref 38–126)
Anion gap: 12 (ref 5–15)
BUN: 10 mg/dL (ref 6–20)
CO2: 22 mmol/L (ref 22–32)
Calcium: 8.8 mg/dL — ABNORMAL LOW (ref 8.9–10.3)
Chloride: 103 mmol/L (ref 98–111)
Creatinine, Ser: 0.64 mg/dL (ref 0.44–1.00)
GFR calc Af Amer: >60 mL/min (ref >60)
GFR calc non Af Amer: >60 mL/min (ref >60)
Glucose, Bld: 204 mg/dL — ABNORMAL HIGH (ref 70–99)
Potassium: 3.5 mmol/L (ref 3.5–5.1)
Sodium: 137 mmol/L (ref 135–145)
Total Bilirubin: 0.8 mg/dL (ref 0.3–1.2)
Total Protein: 7.3 g/dL (ref 6.5–8.1)

## 2019-05-26 LAB — CBC
HCT: 41.9 % (ref 36.0–46.0)
Hemoglobin: 14.1 g/dL (ref 12.0–15.0)
MCH: 27.9 pg (ref 26.0–34.0)
MCHC: 33.7 g/dL (ref 30.0–36.0)
MCV: 83 fL (ref 80.0–100.0)
Platelets: 456 10*3/uL — ABNORMAL HIGH (ref 150–400)
RBC: 5.05 MIL/uL (ref 3.87–5.11)
RDW: 13.4 % (ref 11.5–15.5)
WBC: 7.3 10*3/uL (ref 4.0–10.5)
nRBC: 0 % (ref 0.0–0.2)

## 2019-05-26 LAB — FERRITIN: Ferritin: 428 ng/mL — ABNORMAL HIGH (ref 11–307)

## 2019-05-26 LAB — VITAMIN B12: Vitamin B-12: 864 pg/mL (ref 180–914)

## 2019-05-26 LAB — GLUCOSE, CAPILLARY
Glucose-Capillary: 215 mg/dL — ABNORMAL HIGH (ref 70–99)
Glucose-Capillary: 201 mg/dL — ABNORMAL HIGH (ref 70–99)
Glucose-Capillary: 199 mg/dL — ABNORMAL HIGH (ref 70–99)

## 2019-05-26 LAB — RPR: RPR Ser Ql: NONREACTIVE

## 2019-05-26 LAB — D-DIMER, QUANTITATIVE: D-Dimer, Quant: 0.87 ug/mL-FEU — ABNORMAL HIGH (ref 0.00–0.50)

## 2019-05-26 LAB — TSH: TSH: 3.085 u[IU]/mL (ref 0.350–4.500)

## 2019-05-26 LAB — C-REACTIVE PROTEIN: CRP: 7 mg/dL — ABNORMAL HIGH (ref <1.0)

## 2019-05-26 MED ORDER — INSULIN GLARGINE 100 UNIT/ML ~~LOC~~ SOLN
10.0000 [IU] | Freq: Every day | SUBCUTANEOUS | Status: DC
  Administered 2019-05-26: 10 [IU] via SUBCUTANEOUS
  Filled 2019-05-26: qty 0.1

## 2019-05-26 NOTE — Progress Notes (Signed)
Margaret Larsen [id number] Admission Data: 05/26/2019 3:26 AM Attending Provider: Dickie La, MD  Margaret Larsen, Margaret Leyden, Margaret Larsen Consults/ Treatment Team:   Margaret Larsen is a 49 y.o. female patient admitted from ED awake, alert  & orientated  X 3,  Full Code, VSS - Blood pressure 125/89, pulse 90, temperature 98.6 F (37 C), temperature source Oral, resp. rate (!) 22, SpO2 98 %., O2 room air, no c/o shortness of breath, no c/o chest pain, no distress noted.    IV site WDL:  hand right, condition patent and no redness and antecubital right, condition patent and no redness with a transparent dsg that's clean dry and intact.  Allergies:   Allergies  Allergen Reactions  . Metformin And Related Hives and Diarrhea    Unable to tolerate  . Peanut-Containing Drug Products Shortness Of Breath and Swelling    mouth swelling  . Codeine Other (See Comments)    Patient stated that it slowed her heart rate, shortness of breath  . Diphenhydramine Hcl Hives and Rash  . Fexofenadine-Pseudoephed Er Other (See Comments)    Patient stated that is slowed her heart rate  . Latuda [Lurasidone Hcl] Other (See Comments)    Myalgias all over, most notably in her arms and legs.  Marland Kitchen Shellfish Allergy Itching     Past Medical History:  Diagnosis Date  . Cough 05/13/2016  . Diabetes mellitus   . Hemorrhoids 07/10/2010  . Hypertension   . Kidney stones   . Kidney stones   . Myalgia and myositis 06/19/2015  . Renal disorder    Pt orientation to unit, room and routine. Information packet given to patient/family and safety video watched.  Admission INP armband ID verified with patient/family, and in place. SR up x 2, fall risk assessment complete with Patient and family verbalizing understanding of risks associated with falls. Pt verbalizes an understanding of how to use the call bell and to call for help before getting out of bed.  Skin, clean-dry- intact without evidence of bruising, or skin tears.   No  evidence of skin break down noted on exam. no rashes, no ecchymoses, no wounds  Will cont to monitor and assist as needed.  Walker Shadow, RN 05/26/2019 3:26 AM

## 2019-05-26 NOTE — Progress Notes (Signed)
Family Medicine Teaching Service Daily Progress Note Intern Pager: [phone]  Patient name: Margaret Larsen Medical record number: [id number] Date of birth: 1970/11/19 Age: 49 y.o. Gender: female  Primary Care Provider: Patriciaann Clan, DO Consultants:  Code Status: FULL   Pt Overview and Major Events to Date:  05/25/19: Admitted  Assessment and Plan:  COVID-19  visual hallucination Denies SOB, chest pain or visual and auditory hallucinations. On exam: clear lung fields, no crackles or wheeze, normal WOB and no oxygen requirement. Further discussion regarding hallucinations is documented below. Patient initially became symptomatic on 05/15/19 and tested positive for COVID-19 on 05/18/19. CXR: mild amount of infiltrate or atelectasis consistent with mild COVID-19 pneumonia. Labs: LDH 293, ferritin 445, CRP 4.2, D-dimer 0.83, fibrinogen >800, LA wnl, procalcitonin is <0.1. TSH wnl, B12 864.  S/p bamlanivimab infusion on 5/14, as well as IV decadron on 5/16. Pt took ` additional dose at home after being discharged from the ED but stopped d/t side effects Vital signs: Afebrile, BP 121/86, HR 88, RR 20, sats 94% on room air. Patient has had not required oxygen since admission. -Airborne and contact precautions -Continuous pulse ox and telemetry -Maintain sats above 90% -Follow-up blood culture -F/u UDS, RPR -Lovenox for DVT prophylaxis -Delirium precaution -PT/OT   DMT2 CBGs: 201, 215. CBG on admission 277. Last A1c was 11% on 05/14/19.    Patient states she took 1 pill of steroids at home due to elevated blood sugars. Has been drinking [name] light as she has had decreased p.o. intake. Home meds:NovoLog 40 units daily, Tresiba 86 units daily, Trulicity 1.5 mg every Monday, Jardiance 10 mg daily and aspirin (primary prevention) -CBGs with meals and bedtime -Moderate sensitivity sliding scale -Lantus 10 units -Hold home diabetic medications -PT/OT  Diabetic neuropathy Home med:  Gabapentin 600 mg TID.  -Hold home medication  HTN Bps 121/86  Recommend outpatient follow up as patient may benefit from ACE/ARB for DMT2. -Continue to monitor BP  HLD Patient has diabetes, not currently on statin. Patient has had myalgias in the past (2016) and not been adherent with statin treatment. Recommend follow up outpatient to assess this and possibly restart. -Follow up outpatient  FEN/GI: Carb modified  Prophylaxis: Lovenox  Disposition: Home after pt/ot evaluation   Subjective:  Refusing psychiatry evaluation. She feels she has been accused of being "crazy" and feels her symptoms were due to taking steroids. She has never taken steroids in her life and said her symptoms were a side effect of the steroids. She denies hallucinations for the last 2 days since 05/24/19. Denies hallucinations since being in hospital.    Objective: Temp:  [97.7 F (36.5 C)-98.6 F (37 C)] 98.2 F (36.8 C) (05/19 0744) Pulse Rate:  [81-99] 88 (05/19 0744) Resp:  [16-27] 20 (05/19 0744) BP: (121-138)/(84-101) 121/86 (05/19 0744) SpO2:  [92 %-98 %] 94 % (05/19 0744)  Physical Exam: General: No acute distress, alert, 49 yr old female Cardiovascular: S1 and S2 present, RRR Respiratory: CTAB, normal WOB  Abdomen: soft non tender, bowel sounds present  Extremities: no peripheral   Laboratory: Recent Labs  Lab 05/23/19 0908 05/25/19 1843 05/26/19 0700  WBC 7.0 8.0 7.3  HGB 14.6 14.5 14.1  HCT 43.7 43.5 41.9  PLT 346 437* 456*   Recent Labs  Lab 05/23/19 0908 05/25/19 1843 05/26/19 0700  NA 130* 137 137  K 4.1 3.7 3.5  CL 95* 100 103  CO2 26 24 22   BUN 6 13 10   CREATININE  0.80 0.78 0.64  CALCIUM 8.8* 8.9 8.8*  PROT  --   --  7.3  BILITOT  --   --  0.8  ALKPHOS  --   --  84  ALT  --   --  17  AST  --   --  22  GLUCOSE 348* 277* 204*     Imaging/Diagnostic Tests:  DG Chest Portable 1 View  Result Date: 05/25/2019 CLINICAL DATA:  Shortness of breath, tested  positive for COVID on May 18, 2019. EXAM: PORTABLE CHEST 1 VIEW COMPARISON:  May 23, 2019 FINDINGS: Mild diffuse chronic appearing increased lung markings are seen. A trace amount of residual atelectasis and/or infiltrate is seen within the bilateral lung bases. There is no evidence of a pleural effusion or pneumothorax. The heart size and mediastinal contours are within normal limits. The visualized skeletal structures are unremarkable. IMPRESSION: Trace amount of residual bibasilar atelectasis and/or infiltrate. Electronically Signed   By: Virgina Norfolk M.D.   On: 05/25/2019 18:29   Lattie Haw, MD 05/26/2019, 8:01 AM PGY-1, [place] Intern pager: [phone], text pages welcome

## 2019-05-26 NOTE — Discharge Instructions (Signed)
COVID-19 COVID-19 is a respiratory infection that is caused by a virus called severe acute respiratory syndrome coronavirus 2 (SARS-CoV-2). The disease is also known as coronavirus disease or novel coronavirus. In some people, the virus may not cause any symptoms. In others, it may cause a serious infection. The infection can get worse quickly and can lead to complications, such as:  Pneumonia, or infection of the lungs.  Acute respiratory distress syndrome or ARDS. This is a condition in which fluid build-up in the lungs prevents the lungs from filling with air and passing oxygen into the blood.  Acute respiratory failure. This is a condition in which there is not enough oxygen passing from the lungs to the body or when carbon dioxide is not passing from the lungs out of the body.  Sepsis or septic shock. This is a serious bodily reaction to an infection.  Blood clotting problems.  Secondary infections due to bacteria or fungus.  Organ failure. This is when your body's organs stop working. The virus that causes COVID-19 is contagious. This means that it can spread from person to person through droplets from coughs and sneezes (respiratory secretions). What are the causes? This illness is caused by a virus. You may catch the virus by:  Breathing in droplets from an infected person. Droplets can be spread by a person breathing, speaking, singing, coughing, or sneezing.  Touching something, like a table or a doorknob, that was exposed to the virus (contaminated) and then touching your mouth, nose, or eyes. What increases the risk? Risk for infection You are more likely to be infected with this virus if you:  Are within 6 feet (2 meters) of a person with COVID-19.  Provide care for or live with a person who is infected with COVID-19.  Spend time in crowded indoor spaces or live in shared housing. Risk for serious illness You are more likely to become seriously ill from the virus if  you:  Are 50 years of age or older. The higher your age, the more you are at risk for serious illness.  Live in a nursing home or long-term care facility.  Have cancer.  Have a long-term (chronic) disease such as: ? Chronic lung disease, including chronic obstructive pulmonary disease or asthma. ? A long-term disease that lowers your body's ability to fight infection (immunocompromised). ? Heart disease, including heart failure, a condition in which the arteries that lead to the heart become narrow or blocked (coronary artery disease), a disease which makes the heart muscle thick, weak, or stiff (cardiomyopathy). ? Diabetes. ? Chronic kidney disease. ? Sickle cell disease, a condition in which red blood cells have an abnormal "sickle" shape. ? Liver disease.  Are obese. What are the signs or symptoms? Symptoms of this condition can range from mild to severe. Symptoms may appear any time from 2 to 14 days after being exposed to the virus. They include:  A fever or chills.  A cough.  Difficulty breathing.  Headaches, body aches, or muscle aches.  Runny or stuffy (congested) nose.  A sore throat.  New loss of taste or smell. Some people may also have stomach problems, such as nausea, vomiting, or diarrhea. Other people may not have any symptoms of COVID-19. How is this diagnosed? This condition may be diagnosed based on:  Your signs and symptoms, especially if: ? You live in an area with a COVID-19 outbreak. ? You recently traveled to or from an area where the virus is common. ? You   provide care for or live with a person who was diagnosed with COVID-19. ? You were exposed to a person who was diagnosed with COVID-19.  A physical exam.  Lab tests, which may include: ? Taking a sample of fluid from the back of your nose and throat (nasopharyngeal fluid), your nose, or your throat using a swab. ? A sample of mucus from your lungs (sputum). ? Blood tests.  Imaging tests,  which may include, X-rays, CT scan, or ultrasound. How is this treated? At present, there is no medicine to treat COVID-19. Medicines that treat other diseases are being used on a trial basis to see if they are effective against COVID-19. Your health care provider will talk with you about ways to treat your symptoms. For most people, the infection is mild and can be managed at home with rest, fluids, and over-the-counter medicines. Treatment for a serious infection usually takes places in a hospital intensive care unit (ICU). It may include one or more of the following treatments. These treatments are given until your symptoms improve.  Receiving fluids and medicines through an IV.  Supplemental oxygen. Extra oxygen is given through a tube in the nose, a face mask, or a hood.  Positioning you to lie on your stomach (prone position). This makes it easier for oxygen to get into the lungs.  Continuous positive airway pressure (CPAP) or bi-level positive airway pressure (BPAP) machine. This treatment uses mild air pressure to keep the airways open. A tube that is connected to a motor delivers oxygen to the body.  Ventilator. This treatment moves air into and out of the lungs by using a tube that is placed in your windpipe.  Tracheostomy. This is a procedure to create a hole in the neck so that a breathing tube can be inserted.  Extracorporeal membrane oxygenation (ECMO). This procedure gives the lungs a chance to recover by taking over the functions of the heart and lungs. It supplies oxygen to the body and removes carbon dioxide. Follow these instructions at home: Lifestyle  If you are sick, stay home except to get medical care. Your health care provider will tell you how long to stay home. Call your health care provider before you go for medical care.  Rest at home as told by your health care provider.  Do not use any products that contain nicotine or tobacco, such as cigarettes,  e-cigarettes, and chewing tobacco. If you need help quitting, ask your health care provider.  Return to your normal activities as told by your health care provider. Ask your health care provider what activities are safe for you. General instructions  Take over-the-counter and prescription medicines only as told by your health care provider.  Drink enough fluid to keep your urine pale yellow.  Keep all follow-up visits as told by your health care provider. This is important. How is this prevented?  There is no vaccine to help prevent COVID-19 infection. However, there are steps you can take to protect yourself and others from this virus. To protect yourself:   Do not travel to areas where COVID-19 is a risk. The areas where COVID-19 is reported change often. To identify high-risk areas and travel restrictions, check the CDC travel website: wwwnc.cdc.gov/travel/notices  If you live in, or must travel to, an area where COVID-19 is a risk, take precautions to avoid infection. ? Stay away from people who are sick. ? Wash your hands often with soap and water for 20 seconds. If soap and water   are not available, use an alcohol-based hand sanitizer. ? Avoid touching your mouth, face, eyes, or nose. ? Avoid going out in public, follow guidance from your state and local health authorities. ? If you must go out in public, wear a cloth face covering or face mask. Make sure your mask covers your nose and mouth. ? Avoid crowded indoor spaces. Stay at least 6 feet (2 meters) away from others. ? Disinfect objects and surfaces that are frequently touched every day. This may include:  Counters and tables.  Doorknobs and light switches.  Sinks and faucets.  Electronics, such as phones, remote controls, keyboards, computers, and tablets. To protect others: If you have symptoms of COVID-19, take steps to prevent the virus from spreading to others.  If you think you have a COVID-19 infection, contact  your health care provider right away. Tell your health care team that you think you may have a COVID-19 infection.  Stay home. Leave your house only to seek medical care. Do not use public transport.  Do not travel while you are sick.  Wash your hands often with soap and water for 20 seconds. If soap and water are not available, use alcohol-based hand sanitizer.  Stay away from other members of your household. Let healthy household members care for children and pets, if possible. If you have to care for children or pets, wash your hands often and wear a mask. If possible, stay in your own room, separate from others. Use a different bathroom.  Make sure that all people in your household wash their hands well and often.  Cough or sneeze into a tissue or your sleeve or elbow. Do not cough or sneeze into your hand or into the air.  Wear a cloth face covering or face mask. Make sure your mask covers your nose and mouth. Where to find more information  Centers for Disease Control and Prevention: www.cdc.gov/coronavirus/2019-ncov/index.html  World Health Organization: www.who.int/health-topics/coronavirus Contact a health care provider if:  You live in or have traveled to an area where COVID-19 is a risk and you have symptoms of the infection.  You have had contact with someone who has COVID-19 and you have symptoms of the infection. Get help right away if:  You have trouble breathing.  You have pain or pressure in your chest.  You have confusion.  You have bluish lips and fingernails.  You have difficulty waking from sleep.  You have symptoms that get worse. These symptoms may represent a serious problem that is an emergency. Do not wait to see if the symptoms will go away. Get medical help right away. Call your local emergency services (911 in the [place] Do not drive yourself to the hospital. Let the emergency medical personnel know if you think you have  COVID-19. Summary  COVID-19 is a respiratory infection that is caused by a virus. It is also known as coronavirus disease or novel coronavirus. It can cause serious infections, such as pneumonia, acute respiratory distress syndrome, acute respiratory failure, or sepsis.  The virus that causes COVID-19 is contagious. This means that it can spread from person to person through droplets from breathing, speaking, singing, coughing, or sneezing.  You are more likely to develop a serious illness if you are 50 years of age or older, have a weak immune system, live in a nursing home, or have chronic disease.  There is no medicine to treat COVID-19. Your health care provider will talk with you about ways to treat your symptoms.    Take steps to protect yourself and others from infection. Wash your hands often and disinfect objects and surfaces that are frequently touched every day. Stay away from people who are sick and wear a mask if you are sick. This information is not intended to replace advice given to you by your health care provider. Make sure you discuss any questions you have with your health care provider. Document Revised: 10/23/2018 Document Reviewed: 01/29/2018 Elsevier Patient Education  2020 [employer].   COVID-19 Frequently Asked Questions COVID-19 (coronavirus disease) is an infection that is caused by a large family of viruses. Some viruses cause illness in people and others cause illness in animals like camels, cats, and bats. In some cases, the viruses that cause illness in animals can spread to humans. Where did the coronavirus come from? In December 2019, [place] told the [facility]) of several cases of lung disease (human respiratory illness). These cases were linked to an open seafood and livestock market in the city of [place]. The link to the seafood and livestock market suggests that the virus may have spread from animals to humans. However, since that first  outbreak in December, the virus has also been shown to spread from person to person. What is the name of the disease and the virus? Disease name Early on, this disease was called novel coronavirus. This is because scientists determined that the disease was caused by a new (novel) respiratory virus. The World Health Organization [facility]) has now named the disease COVID-19, or coronavirus disease. Virus name The virus that causes the disease is called severe acute respiratory syndrome coronavirus 2 (SARS-CoV-2). More information on disease and virus naming World Health Organization [facility]): www.who.int/emergencies/diseases/novel-coronavirus-2019/technical-guidance/naming-the-coronavirus-disease-(covid-2019)-and-the-virus-that-causes-it Who is at risk for complications from coronavirus disease? Some people may be at higher risk for complications from coronavirus disease. This includes older adults and people who have chronic diseases, such as heart disease, diabetes, and lung disease. If you are at higher risk for complications, take these extra precautions:  Stay home as much as possible.  Avoid social gatherings and travel.  Avoid close contact with others. Stay at least 6 ft (2 m) away from others, if possible.  Wash your hands often with soap and water for at least 20 seconds.  Avoid touching your face, mouth, nose, or eyes.  Keep supplies on hand at home, such as food, medicine, and cleaning supplies.  If you must go out in public, wear a cloth face covering or face mask. Make sure your mask covers your nose and mouth. How does coronavirus disease spread? The virus that causes coronavirus disease spreads easily from person to person (is contagious). You may catch the virus by:  Breathing in droplets from an infected person. Droplets can be spread by a person breathing, speaking, singing, coughing, or sneezing.  Touching something, like a table or a doorknob, that was exposed to the virus  (contaminated) and then touching your mouth, nose, or eyes. Can I get the virus from touching surfaces or objects? There is still a lot that we do not know about the virus that causes coronavirus disease. Scientists are basing a lot of information on what they know about similar viruses, such as:  Viruses cannot generally survive on surfaces for long. They need a human body (host) to survive.  It is more likely that the virus is spread by close contact with people who are sick (direct contact), such as through: ? Shaking hands or hugging. ? Breathing in respiratory droplets  that travel through the air. Droplets can be spread by a person breathing, speaking, singing, coughing, or sneezing.  It is less likely that the virus is spread when a person touches a surface or object that has the virus on it (indirect contact). The virus may be able to enter the body if the person touches a surface or object and then touches his or her face, eyes, nose, or mouth. Can a person spread the virus without having symptoms of the disease? It may be possible for the virus to spread before a person has symptoms of the disease, but this is most likely not the main way the virus is spreading. It is more likely for the virus to spread by being in close contact with people who are sick and breathing in the respiratory droplets spread by a person breathing, speaking, singing, coughing, or sneezing. What are the symptoms of coronavirus disease? Symptoms vary from person to person and can range from mild to severe. Symptoms may include:  Fever or chills.  Cough.  Difficulty breathing or feeling short of breath.  Headaches, body aches, or muscle aches.  Runny or stuffy (congested) nose.  Sore throat.  New loss of taste or smell.  Nausea, vomiting, or diarrhea. These symptoms can appear anywhere from 2 to 14 days after you have been exposed to the virus. Some people may not have any symptoms. If you develop  symptoms, call your health care provider. People with severe symptoms may need hospital care. Should I be tested for this virus? Your health care provider will decide whether to test you based on your symptoms, history of exposure, and your risk factors. How does a health care provider test for this virus? Health care providers will collect samples to send for testing. Samples may include:  Taking a swab of fluid from the back of your nose and throat, your nose, or your throat.  Taking fluid from the lungs by having you cough up mucus (sputum) into a sterile cup.  Taking a blood sample. Is there a treatment or vaccine for this virus? Currently, there is no vaccine to prevent coronavirus disease. Also, there are no medicines like antibiotics or antivirals to treat the virus. A person who becomes sick is given supportive care, which means rest and fluids. A person may also relieve his or her symptoms by using over-the-counter medicines that treat sneezing, coughing, and runny nose. These are the same medicines that a person takes for the common cold. If you develop symptoms, call your health care provider. People with severe symptoms may need hospital care. What can I do to protect myself and my family from this virus?     You can protect yourself and your family by taking the same actions that you would take to prevent the spread of other viruses. Take the following actions:  Wash your hands often with soap and water for at least 20 seconds. If soap and water are not available, use alcohol-based hand sanitizer.  Avoid touching your face, mouth, nose, or eyes.  Cough or sneeze into a tissue, sleeve, or elbow. Do not cough or sneeze into your hand or the air. ? If you cough or sneeze into a tissue, throw it away immediately and wash your hands.  Disinfect objects and surfaces that you frequently touch every day.  Stay away from people who are sick.  Avoid going out in public, follow  guidance from your state and local health authorities.  Avoid crowded indoor spaces.   Stay at least 6 ft (2 m) away from others.  If you must go out in public, wear a cloth face covering or face mask. Make sure your mask covers your nose and mouth.  Stay home if you are sick, except to get medical care. Call your health care provider before you get medical care. Your health care provider will tell you how long to stay home.  Make sure your vaccines are up to date. Ask your health care provider what vaccines you need. What should I do if I need to travel? Follow travel recommendations from your local health authority, the CDC, and WHO. Travel information and advice  Centers for Disease Control and Prevention (CDC): BodyEditor.hu  World Health Organization [facility]): ThirdIncome.ca Know the risks and take action to protect your health  You are at higher risk of getting coronavirus disease if you are traveling to areas with an outbreak or if you are exposed to travelers from areas with an outbreak.  Wash your hands often and practice good hygiene to lower the risk of catching or spreading the virus. What should I do if I am sick? General instructions to stop the spread of infection  Wash your hands often with soap and water for at least 20 seconds. If soap and water are not available, use alcohol-based hand sanitizer.  Cough or sneeze into a tissue, sleeve, or elbow. Do not cough or sneeze into your hand or the air.  If you cough or sneeze into a tissue, throw it away immediately and wash your hands.  Stay home unless you must get medical care. Call your health care provider or local health authority before you get medical care.  Avoid public areas. Do not take public transportation, if possible.  If you can, wear a mask if you must go out of the house or if you are in close contact with someone  who is not sick. Make sure your mask covers your nose and mouth. Keep your home clean  Disinfect objects and surfaces that are frequently touched every day. This may include: ? Counters and tables. ? Doorknobs and light switches. ? Sinks and faucets. ? Electronics such as phones, remote controls, keyboards, computers, and tablets.  Wash dishes in hot, soapy water or use a dishwasher. Air-dry your dishes.  Wash laundry in hot water. Prevent infecting other household members  Let healthy household members care for children and pets, if possible. If you have to care for children or pets, wash your hands often and wear a mask.  Sleep in a different bedroom or bed, if possible.  Do not share personal items, such as razors, toothbrushes, deodorant, combs, brushes, towels, and washcloths. Where to find more information Centers for Disease Control and Prevention (CDC)  Information and news updates: https://www.butler-gonzalez.com/ World Health Organization [facility])  Information and news updates: MissExecutive.com.ee  Coronavirus health topic: https://www.castaneda.info/  Questions and answers on COVID-19: OpportunityDebt.at  Global tracker: who.sprinklr.com American Academy of Pediatrics (AAP)  Information for families: www.healthychildren.org/English/health-issues/conditions/chest-lungs/Pages/2019-Novel-Coronavirus.aspx The coronavirus situation is changing rapidly. Check your local health authority website or the CDC and [facility] websites for updates and news. When should I contact a health care provider?  Contact your health care provider if you have symptoms of an infection, such as fever or cough, and you: ? Have been near anyone who is known to have coronavirus disease. ? Have come into contact with a person who is suspected to have coronavirus disease. ? Have traveled to an area where there is  an outbreak of  COVID-19. When should I get emergency medical care?  Get help right away by calling your local emergency services (911 in the [place] if you have: ? Trouble breathing. ? Pain or pressure in your chest. ? Confusion. ? Blue-tinged lips and fingernails. ? Difficulty waking from sleep. ? Symptoms that get worse. Let the emergency medical personnel know if you think you have coronavirus disease. Summary  A new respiratory virus is spreading from person to person and causing COVID-19 (coronavirus disease).  The virus that causes COVID-19 appears to spread easily. It spreads from one person to another through droplets from breathing, speaking, singing, coughing, or sneezing.  Older adults and those with chronic diseases are at higher risk of disease. If you are at higher risk for complications, take extra precautions.  There is currently no vaccine to prevent coronavirus disease. There are no medicines, such as antibiotics or antivirals, to treat the virus.  You can protect yourself and your family by washing your hands often, avoiding touching your face, and covering your coughs and sneezes. This information is not intended to replace advice given to you by your health care provider. Make sure you discuss any questions you have with your health care provider. Document Revised: 10/23/2018 Document Reviewed: 04/21/2018 Elsevier Patient Education  2020 [employer].  COVID-19: Quarantine vs. Isolation QUARANTINE keeps someone who was in close contact with someone who has COVID-19 away from others. If you had close contact with a person who has COVID-19  Stay home until 14 days after your last contact.  Check your temperature twice a day and watch for symptoms of COVID-19.  If possible, stay away from people who are at higher-risk for getting very sick from COVID-19. ISOLATION keeps someone who is sick or tested positive for COVID-19 without symptoms away from others, even in their own  home. If you are sick and think or know you have COVID-19  Stay home until after ? At least 10 days since symptoms first appeared and ? At least 24 hours with no fever without fever-reducing medication and ? Symptoms have improved If you tested positive for COVID-19 but do not have symptoms  Stay home until after ? 10 days have passed since your positive test If you live with others, stay in a specific "sick room" or area and away from other people or animals, including pets. Use a separate bathroom, if available. cdc.gov/coronavirus 07/27/2018 This information is not intended to replace advice given to you by your health care provider. Make sure you discuss any questions you have with your health care provider. Document Revised: 12/10/2018 Document Reviewed: 12/10/2018 Elsevier Patient Education  2020 Elsevier Inc.  

## 2019-05-26 NOTE — Care Management (Signed)
Pt deemed stable for discharge home today.  Pt confirms she does not have insurance.  Pt confirms she has a [facility] and she gets her medications via the MAP program.  Per AVS pt will not go home on new medications.  Pt states "I live with a bunch of people so I have help if I need it".    Pt denied NC360 parameters.  Pt informed CM that her friend will transport her home via private vehicle. Discharge order written -no outstanding TOC needs identified - CM signing off.

## 2019-05-28 ENCOUNTER — Other Ambulatory Visit: Payer: Self-pay

## 2019-05-28 ENCOUNTER — Telehealth (INDEPENDENT_AMBULATORY_CARE_PROVIDER_SITE_OTHER): Payer: Self-pay | Admitting: Family Medicine

## 2019-05-28 ENCOUNTER — Telehealth: Payer: Self-pay | Admitting: Family Medicine

## 2019-05-28 DIAGNOSIS — U071 COVID-19: Secondary | ICD-10-CM

## 2019-05-28 NOTE — Progress Notes (Signed)
[place] Telemedicine Visit  Patient consented to have virtual visit and was identified by name and date of birth. Method of visit: Video  Encounter participants: Patient: Margaret Larsen - located at home Provider: Patriciaann Clan - located at [facility] Others (if applicable): none  Chief Complaint: F/u COVID PNA/hospital f/u   HPI: Margaret Larsen is a 49 year old female presenting for follow-up of Covid pneumonia and recent hospital stay.  She was admitted from 5/18-5/19 for worsening Covid symptoms and hallucinations, ultimately felt to be secondary to prednisone prescribed for symptoms. Observation only, no additional medications were given (besides symptomatic relief). Reassuringly she remained on room air for the duration of her hospital stay. No further hallucinations since 5/17.   Today she is feeling significant better. Still fatigued, but no more myalgias, fever, or significant SOB. Appetite improving and drinking plenty of water. Still can't taste or smell. She reports many frustrations regarding her care at that hospital, provided supportive listening during this time. Needs note for work.   ROS: per HPI  Pertinent PMHx: OSA,UncontrolledT2DM, GERD, HTN, elevated BMI  Exam:  There were no vitals taken for this visit.  General: NAD, lying comfortably  Respiratory: Unlabored breathing, speaking in full sentences, no cough during exam   Assessment/Plan:  COVID-19 Day 14 of illness with significant recovery in the past few days. Did not require oxygen during hospital stay. Set expectations for likelihood of lingering parosmia/dysgeusia and cough. Provided with note to return to work on 5/26 to allow continued recovery time. Instructed to call the clinic if she does not feel able to return at that time.     ED precautions discussed, follow up if symptoms not improving or sooner if worsening, otherwise follow up for chronic conditions.   Time spent during  visit with patient: 20 minutes  Patriciaann Clan, DO

## 2019-05-30 LAB — CULTURE, BLOOD (ROUTINE X 2)
Culture: NO GROWTH
Culture: NO GROWTH
Special Requests: ADEQUATE
Special Requests: ADEQUATE

## 2019-06-01 ENCOUNTER — Encounter: Payer: Self-pay | Admitting: Family Medicine

## 2019-06-01 NOTE — Assessment & Plan Note (Addendum)
Day 14 of illness with significant recovery in the past few days. Did not require oxygen during hospital stay. Set expectations for likelihood of lingering parosmia/dysgeusia and cough. Provided with note to return to work on 5/26 to allow continued recovery time. Instructed to call the clinic if she does not feel able to return at that time.

## 2019-06-10 ENCOUNTER — Other Ambulatory Visit: Payer: Self-pay | Admitting: Family Medicine

## 2019-06-10 DIAGNOSIS — E1165 Type 2 diabetes mellitus with hyperglycemia: Secondary | ICD-10-CM

## 2019-06-10 DIAGNOSIS — IMO0002 Reserved for concepts with insufficient information to code with codable children: Secondary | ICD-10-CM

## 2019-06-16 ENCOUNTER — Other Ambulatory Visit: Payer: Self-pay | Admitting: Family Medicine

## 2019-06-16 DIAGNOSIS — J029 Acute pharyngitis, unspecified: Secondary | ICD-10-CM

## 2019-06-16 DIAGNOSIS — J302 Other seasonal allergic rhinitis: Secondary | ICD-10-CM

## 2019-08-12 ENCOUNTER — Ambulatory Visit: Payer: Self-pay | Admitting: Family Medicine

## 2019-08-12 NOTE — Progress Notes (Deleted)
    SUBJECTIVE:   CHIEF COMPLAINT / HPI: Left arm sore, knot present   ***  PERTINENT  PMH / PSH: ***  OBJECTIVE:   There were no vitals taken for this visit.  ***  ASSESSMENT/PLAN:   No problem-specific Assessment & Plan notes found for this encounter.     Patriciaann Clan, [place]

## 2019-09-06 ENCOUNTER — Encounter: Payer: Self-pay | Admitting: Family Medicine

## 2019-09-21 ENCOUNTER — Other Ambulatory Visit: Payer: Self-pay | Admitting: Family Medicine

## 2019-09-21 DIAGNOSIS — E1165 Type 2 diabetes mellitus with hyperglycemia: Secondary | ICD-10-CM

## 2019-09-21 DIAGNOSIS — IMO0002 Reserved for concepts with insufficient information to code with codable children: Secondary | ICD-10-CM

## 2019-09-21 DIAGNOSIS — J302 Other seasonal allergic rhinitis: Secondary | ICD-10-CM

## 2019-10-21 ENCOUNTER — Other Ambulatory Visit: Payer: Self-pay

## 2019-10-21 DIAGNOSIS — IMO0002 Reserved for concepts with insufficient information to code with codable children: Secondary | ICD-10-CM

## 2019-10-21 DIAGNOSIS — E1165 Type 2 diabetes mellitus with hyperglycemia: Secondary | ICD-10-CM

## 2019-10-21 MED ORDER — NOVOLOG FLEXPEN 100 UNIT/ML ~~LOC~~ SOPN: 40.0000 [IU] | PEN_INJECTOR | Freq: Every day | SUBCUTANEOUS | 0 refills | Status: DC

## 2019-10-21 NOTE — Telephone Encounter (Signed)
Patient calls nurse line requesting refill on Novolog. Patient reports that she has been taking additional insulin due to polyuria. Patient does not have working meter to check blood sugar, so she has been adjusting as treatment of symptoms. Patient reports taking 45-50 units of novolog daily. Patient reports that she is completely out of Novolog at this time and insurance will not pay for additional pen unless directions are changed.   Scheduled patient follow up diabetes appointment for next Thursday with Dr. Higinio Plan.   ED precautions given.   To PCP  Please advise.   Talbot Grumbling, RN

## 2019-10-26 ENCOUNTER — Telehealth: Payer: Self-pay | Admitting: *Deleted

## 2019-10-26 NOTE — Telephone Encounter (Signed)
[name] from health department called and stated that they are holding pts insulin that was sent in on 10/21/2019 with sig to inject 40 units into skin daily due to the fact that the pt stated that it had been increased and she was doing more than 40 but did not give the amount to Franquez.  Last visit with PCP was  05/28/2019 via video.  Told [name] I would send message to PCP and if it needs to be increased we would send in new Rx or will call with any changes and if there are none will call her and let her know it is ok to dispense as written.Aldeen Riga Zimmerman Rumple, CMA

## 2019-10-27 NOTE — Telephone Encounter (Signed)
Novolog rx already filled. Will discuss appropriate regimen during appointment tomorrow, 10/21.   Patriciaann Clan, DO

## 2019-10-28 ENCOUNTER — Ambulatory Visit (INDEPENDENT_AMBULATORY_CARE_PROVIDER_SITE_OTHER): Payer: Self-pay | Admitting: Family Medicine

## 2019-10-28 ENCOUNTER — Other Ambulatory Visit: Payer: Self-pay

## 2019-10-28 VITALS — BP 132/85 | HR 87 | Wt 209.6 lb

## 2019-10-28 DIAGNOSIS — R87610 Atypical squamous cells of undetermined significance on cytologic smear of cervix (ASC-US): Secondary | ICD-10-CM

## 2019-10-28 DIAGNOSIS — E1165 Type 2 diabetes mellitus with hyperglycemia: Secondary | ICD-10-CM

## 2019-10-28 DIAGNOSIS — I10 Essential (primary) hypertension: Secondary | ICD-10-CM

## 2019-10-28 DIAGNOSIS — F319 Bipolar disorder, unspecified: Secondary | ICD-10-CM

## 2019-10-28 DIAGNOSIS — E1142 Type 2 diabetes mellitus with diabetic polyneuropathy: Secondary | ICD-10-CM

## 2019-10-28 DIAGNOSIS — E118 Type 2 diabetes mellitus with unspecified complications: Secondary | ICD-10-CM

## 2019-10-28 DIAGNOSIS — IMO0002 Reserved for concepts with insufficient information to code with codable children: Secondary | ICD-10-CM

## 2019-10-28 LAB — POCT GLYCOSYLATED HEMOGLOBIN (HGB A1C): Hemoglobin A1C: 9.5 % — AB (ref 4.0–5.6)

## 2019-10-28 MED ORDER — EMPAGLIFLOZIN 10 MG PO TABS: 10.0000 mg | ORAL_TABLET | Freq: Every day | ORAL | 0 refills | Status: DC

## 2019-10-28 MED ORDER — NOVOLOG FLEXPEN 100 UNIT/ML ~~LOC~~ SOPN: 45.0000 [IU] | PEN_INJECTOR | Freq: Every day | SUBCUTANEOUS | 2 refills | Status: DC

## 2019-10-28 NOTE — Progress Notes (Signed)
    SUBJECTIVE:   CHIEF COMPLAINT / HPI: Diabetes fu   [name] is a 49 year old female presenting to discuss the following:  Diabetes: She currently is taking [place] 86 units daily, Trulicity, and NovoLog with her biggest meal.  She previously was prescribed 40 units of NovoLog daily, however has slowly self increase that from anywhere to 45-50 units because her glucose has been higher.  She previously was prescribed Jardiance during her last visit, however reports the pharmacy told her that she never received it and so she did not start taking this medication.  Last A1c 11 in 05/2019.  Has struggled with poor appetite, however seems to be worse since her Covid infection and eats in small snack size.  Still has minimal taste and smell.  Bipolar/depression: Reports she continues to struggle with stress.  She currently takes care of her daughter's children and is not speaking to her daughter at this moment.  She is trying to show her daughter "tough love" but is upset that this is the angle she has to take with her daughter for her to do better.  She previously was on Depakote and Abilify, however had terrible side effects with both.  She would like to meet with this therapist and possibly consider medication in the future.    Office Visit from 05/14/2019 in [place]  Thoughts that you would be better off dead, or of hurting yourself in some way Not at all  PHQ-9 Total Score 18     Health maintenance: Hepatitis C screening and Covid vaccine.  She additionally is due for a Pap smear and would like to postpone this until next visit.   PERTINENT  PMH / PSH: Bipolar 1 disorder, type 2 diabetes with peripheral neuropathy, GERD, OSA, hypertension, recent Covid infection in 05/2019, ASCUS on Pap smear  OBJECTIVE:   BP 132/85   Pulse 87   Wt 209 lb 9.6 oz (95.1 kg)   SpO2 94%   BMI 42.33 kg/m   General: Alert, NAD HEENT: NCAT Cardiac: RRR Lungs: Clear bilaterally, no  increased WOB on RA Msk: Moves all extremities spontaneously, gait normal   Ext: Warm, dry, 2+ distal pulses  ASSESSMENT/PLAN:   Uncontrolled type 2 diabetes mellitus with complication O9B 9.5 today, which is an exceptional improvement as her A1c has not been <10 since 2013, however I do worry it is in the setting of decreased po intake with lack of appetite.  Will continue towards improved glycemic control with goal around 7, officially increase NovoLog to 45 U with largest meal and Rx'd Jardiance 10 mg (counseled on common side effects).  Continue Tresiba 86 U and Trulicity as is.  Encouraged Glucerna shakes for improved nutrition.  HYPERTENSION, BENIGN ESSENTIAL Not on any antihypertensive medications, BP well controlled today.  Will continue to monitor.  Bipolar disorder ([place]) Chronic, currently in depressive phase without SI/HI.  Not currently on medication with several medication failure in the past d/t SE.  Provided with counseling resources in the community to establish care as soon as possible.  Aware of crisis hotline.  Atypical squamous cells of undetermined significance (ASCUS) on Papanicolaou smear of cervix Overdue for repeat Pap smear, last Pap in 2015 showing ASCUS/HPV negative.  Encouraged to follow-up with Pap smear in the near future.    Follow-up in 1 month for diabetes/Pap smear, or sooner if needed. Will need BMP at that time.   Patriciaann Clan, [place]

## 2019-10-28 NOTE — Patient Instructions (Signed)
It was wonderful seeing you today.  You should have NovoLog 45 units with your largest meal and now adding Jardiance to pick up at the pharmacy tonight.  You can continue to take your [place] Aas as you have been.  I encourage you to try some type of shake with protein in it, glycerin would be a good choice with your diabetes, to make sure that you are getting energy throughout the day.  Try to stay active at work and and at home.  I would encourage you to try again or going to walk with your family to get energy out for them as well.   Therapy and Counseling Resources Most providers on this list will take Medicaid. Patients with commercial insurance or Medicare should contact their insurance company to get a list of in network providers.  Best[facility]:Psychiatry and Counseling [address] [facility] [place]. [place], [place] [address] [place]  [address], [place], [place] [address]      [place] [address]  [place], [place] [address] [phone]  [place] [address], [place]  [place], [place] [address]       [phone]      [facility]inny Bl[facility]om [facility] [address] [place], [place]  [facility]:  [place]. [place] [phone]  [facility]:  [place] [address] [place] [phone]  [facility] (under & uninsured) [address] [place] [phone]    [email or web address]    [place] [address]  [place]    [phone]  [facility]     Phone:  [phone]     [place]  [place] [address]      [place], [place] ext [place]: [place], [place], [place]   [place]  ([place] therapist) [email or web address]  [place] [address]   [place] [address]    [phone]    [facility]   [address],  [place], [place]   [place]  [place]  [facility]  [address] [place], [place]        [phone]  Open Access/Walk In Clinic under & uninsured  [facility]  [address] [place], [place] Crisis [phone]  [facility] of the [place],  ([place])   [place], [place]: [phone]) 8:30 - 12; 1 - 2:30  Family Service of the [place],  [place], [place]    [phone]):8:30 - 12; 2 - 3PM  [facility] [employer],  [address],  [place]; [phone]):   Mon - Fri 8 AM - 5 PM  [facility] [place]  MWF 12:30 to 3:00 or call to schedule an appointment  [phone]  Specific Provider options Psychology Today  [email or web address] 1. click on find a therapist  2. enter your zip code 3. left side and select or tailor a therapist for your specific need.   [facility] Provider Directory [email or web address]  (Medicaid)   Follow all drop down to find a provider  Social Support program [place] 336) [phone] or [email or web address] [address], [place], [place] Recovery support and educational   24- Hour Availability:     [facility]   [address] [place], [place] Crisis [phone]   [facility] of the [employer] [phone]  [place]  [phone]    [place]  [phone] (after hours)   Therapeutic Alternative/Mobile Crisis   [phone]   [place] National Suicide Hotline  [phone] Diamantina Monks)   Call 911 or  go to emergency room   [facility]  [phone]);  [place] and [employer]  [phone]); [place], [place], [place], [place], [place], [place], [place]

## 2019-10-29 ENCOUNTER — Encounter: Payer: Self-pay | Admitting: Family Medicine

## 2019-10-29 NOTE — Assessment & Plan Note (Signed)
Overdue for repeat Pap smear, last Pap in 2015 showing ASCUS/HPV negative.  Encouraged to follow-up with Pap smear in the near future.

## 2019-10-29 NOTE — Assessment & Plan Note (Addendum)
A1c 9.5 today, which is an exceptional improvement as her A1c has not been <10 since 2013, however I do worry it is in the setting of decreased po intake with lack of appetite.  Will continue towards improved glycemic control with goal around 7, officially increase NovoLog to 45 U with largest meal and Rx'd Jardiance 10 mg (counseled on common side effects).  Continue Tresiba 86 U and Trulicity as is.  Encouraged Glucerna shakes for improved nutrition.

## 2019-10-29 NOTE — Assessment & Plan Note (Signed)
Chronic, currently in depressive phase without SI/HI.  Not currently on medication with several medication failure in the past d/t SE.  Provided with counseling resources in the community to establish care as soon as possible.  Aware of crisis hotline.

## 2019-10-29 NOTE — Assessment & Plan Note (Signed)
Not on any antihypertensive medications, BP well controlled today.  Will continue to monitor.

## 2019-11-11 ENCOUNTER — Other Ambulatory Visit: Payer: Self-pay | Admitting: Family Medicine

## 2019-11-11 DIAGNOSIS — J302 Other seasonal allergic rhinitis: Secondary | ICD-10-CM

## 2019-12-14 ENCOUNTER — Other Ambulatory Visit: Payer: Self-pay | Admitting: Family Medicine

## 2019-12-14 DIAGNOSIS — J302 Other seasonal allergic rhinitis: Secondary | ICD-10-CM

## 2019-12-14 NOTE — Progress Notes (Signed)
Patient had a late cancellation to her appt 12/15/2019. Letter was sent to patient's home stating no-show/late cancellation policy.   Milus Banister, [place], PGY-3 12/19/2019 7:07 AM

## 2019-12-15 ENCOUNTER — Encounter: Payer: Self-pay | Admitting: Family Medicine

## 2019-12-15 ENCOUNTER — Ambulatory Visit (INDEPENDENT_AMBULATORY_CARE_PROVIDER_SITE_OTHER): Payer: Self-pay | Admitting: Family Medicine

## 2019-12-15 DIAGNOSIS — Z5329 Procedure and treatment not carried out because of patient's decision for other reasons: Secondary | ICD-10-CM

## 2019-12-19 DIAGNOSIS — Z91199 Patient's noncompliance with other medical treatment and regimen due to unspecified reason: Secondary | ICD-10-CM | POA: Insufficient documentation

## 2019-12-19 DIAGNOSIS — Z5329 Procedure and treatment not carried out because of patient's decision for other reasons: Secondary | ICD-10-CM | POA: Insufficient documentation

## 2019-12-22 ENCOUNTER — Other Ambulatory Visit: Payer: Self-pay | Admitting: Family Medicine

## 2019-12-22 DIAGNOSIS — IMO0002 Reserved for concepts with insufficient information to code with codable children: Secondary | ICD-10-CM

## 2019-12-22 DIAGNOSIS — E1165 Type 2 diabetes mellitus with hyperglycemia: Secondary | ICD-10-CM

## 2020-01-05 ENCOUNTER — Other Ambulatory Visit: Payer: Self-pay | Admitting: Family Medicine

## 2020-01-05 DIAGNOSIS — J302 Other seasonal allergic rhinitis: Secondary | ICD-10-CM

## 2020-01-28 ENCOUNTER — Encounter: Payer: Self-pay | Admitting: Family Medicine

## 2020-02-14 ENCOUNTER — Other Ambulatory Visit: Payer: Self-pay | Admitting: Family Medicine

## 2020-02-14 DIAGNOSIS — E1165 Type 2 diabetes mellitus with hyperglycemia: Secondary | ICD-10-CM

## 2020-02-14 DIAGNOSIS — IMO0002 Reserved for concepts with insufficient information to code with codable children: Secondary | ICD-10-CM

## 2020-02-14 DIAGNOSIS — J302 Other seasonal allergic rhinitis: Secondary | ICD-10-CM

## 2020-03-20 ENCOUNTER — Other Ambulatory Visit: Payer: Self-pay | Admitting: Family Medicine

## 2020-03-20 DIAGNOSIS — J302 Other seasonal allergic rhinitis: Secondary | ICD-10-CM

## 2020-03-24 ENCOUNTER — Telehealth: Payer: Self-pay

## 2020-03-24 NOTE — Telephone Encounter (Signed)
Patient calls nurse line requesting refill of diflucan. Patient reports that she has been having vaginal itching. Patient states that she believes this is yeast infection as her blood sugars have been running high. Patient is unable to measure blood sugar, however, reports increased urination.   Denies abdominal pain, vomiting, confusion, dizziness, lethargy. Patient states that she does not have insurance and is not able to afford glucometer and testing strips. Patient has been having difficulty in obtaining appointment for orange card.   Please advise additional recommendations.   Talbot Grumbling, RN

## 2020-03-25 ENCOUNTER — Other Ambulatory Visit: Payer: Self-pay | Admitting: Family Medicine

## 2020-03-25 DIAGNOSIS — N898 Other specified noninflammatory disorders of vagina: Secondary | ICD-10-CM

## 2020-03-25 MED ORDER — FLUCONAZOLE 150 MG PO TABS: ORAL_TABLET | ORAL | 0 refills | Status: DC

## 2020-03-27 NOTE — Telephone Encounter (Signed)
I sent in Diflucan for the patient over the weekend.  Pharmacy team, is there any assistance we can provide to get her a glucometer with testing strips?  She certainly needs to be seen for her diabetes, however understandably it has been a struggle for her especially with no coverage.  She would be a great patient to be seen within the pharmacy clinic concurrently.  Patriciaann Clan, DO

## 2020-03-28 ENCOUNTER — Other Ambulatory Visit: Payer: Self-pay | Admitting: Family Medicine

## 2020-03-28 DIAGNOSIS — E1165 Type 2 diabetes mellitus with hyperglycemia: Secondary | ICD-10-CM

## 2020-03-28 DIAGNOSIS — IMO0002 Reserved for concepts with insufficient information to code with codable children: Secondary | ICD-10-CM

## 2020-03-28 MED ORDER — TRUE METRIX METER W/DEVICE KIT: PACK | 0 refills | Status: DC

## 2020-03-28 NOTE — Telephone Encounter (Signed)
Prescription for Truemetrix meter can be sent in to [facility] and it is no cost to patient (assuming she has not received one in the past two years from there). The test strips are $10 for 100 and the lancets are $2 for 100. Once the prescriptions are sent in I will follow-up with pharmacy and then reach out to patient to let her know and schedule an appt to make sure she has no trouble checking her BG with glucometer.

## 2020-03-28 NOTE — Telephone Encounter (Signed)
I placed the order for the Trumetrix blood glucose kit, hopefully this is correct? Or needs each individually sent in?   Thank you for your help!

## 2020-03-29 ENCOUNTER — Other Ambulatory Visit: Payer: Self-pay | Admitting: Family Medicine

## 2020-03-29 DIAGNOSIS — IMO0002 Reserved for concepts with insufficient information to code with codable children: Secondary | ICD-10-CM

## 2020-03-29 DIAGNOSIS — E1165 Type 2 diabetes mellitus with hyperglycemia: Secondary | ICD-10-CM

## 2020-03-29 MED ORDER — GLUCOSE BLOOD VI STRP
ORAL_STRIP | 12 refills | Status: DC
Start: 2020-03-29 — End: 2021-02-05

## 2020-03-29 MED ORDER — TRUEPLUS LANCETS 28G MISC: 4 refills | Status: DC

## 2020-03-29 NOTE — Telephone Encounter (Signed)
Hi Dr. Higinio Plan,  Please see Camille's notes. I am still unable to send in prescriptions electronically otherwise I would.  Thanks! Natalie Mceuen

## 2020-03-29 NOTE — Telephone Encounter (Signed)
Yes a prescription for each needs to be sent in... The meter, test strips and lancets.

## 2020-03-29 NOTE — Telephone Encounter (Signed)
Placed orders for both lancets and strips, hopefully correct. Thank you for your help!   Patriciaann Clan, DO

## 2020-03-29 NOTE — Telephone Encounter (Signed)
It still needs the strips and lancets called in & then she'll be set.

## 2020-04-04 MED FILL — !TRUE METRIX BLOOD GLUCOSE: 1 days supply | Qty: 1 | Fill #0

## 2020-04-04 MED FILL — TRUE METRIX GLUCOSE TEST ST: 25 days supply | Qty: 100 | Fill #0

## 2020-04-04 MED FILL — TRUEplus LANCETS 28G MISC: 50 days supply | Qty: 100 | Fill #0

## 2020-04-04 NOTE — Telephone Encounter (Signed)
Margaret Larsen is working on it now... for some reason it was placed on hold. Give me just a few & I will have the pricing details for you if there arent any issues.

## 2020-04-05 NOTE — Telephone Encounter (Signed)
Patient is aware and will pick up tomorrow. Scheduled appt for Monday 4/4

## 2020-04-05 NOTE — Telephone Encounter (Signed)
Diabetic supplies are ready, and $12. They just ask if she can pick it up before Friday (pharmacy is changing over this weekend)

## 2020-04-10 ENCOUNTER — Ambulatory Visit: Payer: Self-pay | Admitting: Pharmacist

## 2020-04-12 ENCOUNTER — Ambulatory Visit (INDEPENDENT_AMBULATORY_CARE_PROVIDER_SITE_OTHER): Payer: Self-pay | Admitting: Pharmacist

## 2020-04-12 ENCOUNTER — Other Ambulatory Visit: Payer: Self-pay

## 2020-04-12 DIAGNOSIS — E118 Type 2 diabetes mellitus with unspecified complications: Secondary | ICD-10-CM

## 2020-04-12 DIAGNOSIS — E1165 Type 2 diabetes mellitus with hyperglycemia: Secondary | ICD-10-CM

## 2020-04-12 DIAGNOSIS — IMO0002 Reserved for concepts with insufficient information to code with codable children: Secondary | ICD-10-CM

## 2020-04-12 LAB — POCT GLYCOSYLATED HEMOGLOBIN (HGB A1C): HbA1c, POC (controlled diabetic range): 10.9 % — AB (ref 0.0–7.0)

## 2020-04-12 MED ORDER — INSULIN LISPRO (1 UNIT DIAL) 100 UNIT/ML (KWIKPEN)
45.0000 [IU] | PEN_INJECTOR | Freq: Three times a day (TID) | SUBCUTANEOUS | 0 refills | Status: DC
  Filled 2020-04-12 (×2): qty 15, 11d supply, fill #0
  Filled 2020-04-12: qty 15, 33d supply, fill #0

## 2020-04-12 MED ORDER — INSULIN LISPRO (1 UNIT DIAL) 100 UNIT/ML (KWIKPEN)
45.0000 [IU] | PEN_INJECTOR | Freq: Every day | SUBCUTANEOUS | 0 refills | Status: DC
  Filled 2020-04-12: qty 9, 20d supply, fill #0

## 2020-04-12 NOTE — Patient Instructions (Signed)
Margaret Larsen it was a pleasure seeing you today.   Please do the following:  1. START HUMALOG 45 UNITS with meals as directed today during your appointment. If you have any questions or if you believe something has occurred because of this change, call me or your doctor to let one of [place] know. If your blood glucose is <150 and you are only taking a few bites of a meal do not take Humalog. Continue using your sliding scale method of reducing your Humalog based on the size of your meal. 2. STOP NOVOLOG 45 UNITS with meals 3. Continue checking blood sugars at home. It's really important that you record these and bring these in to your next doctor's appointment. Please check your blood glucose first thing when you wake up and then a few times before a meal and then around 2 hours after the meal. 4. Continue making the lifestyle changes we've discussed together during our visit. Diet and exercise play a significant role in improving your blood sugars.  5. I will call you on Monday to review your blood glucose readings with you.   Hypoglycemia or low blood sugar:   Low blood sugar can happen quickly and may become an emergency if not treated right away.   While this shouldn't happen often, it can be brought upon if you skip a meal or do not eat enough. Also, if your insulin or other diabetes medications are dosed too high, this can cause your blood sugar to go to low.   Warning signs of low blood sugar include: 1. Feeling shaky or dizzy 2. Feeling weak or tired  3. Excessive hunger 4. Feeling anxious or upset  5. Sweating even when you aren't exercising  What to do if I experience low blood sugar? Follow the Rule of 15 1. Check your blood sugar with your meter. If lower than 70, proceed to step 2.  2. Treat with 15 grams of fast acting carbs which is found in 3-4 glucose tablets. If none are available you can try hard candy, 1 tablespoon of sugar or honey,4 ounces of fruit juice, or 6 ounces of  REGULAR soda.  3. Re-check your sugar in 15 minutes. If it is still below 70, do what you did in step 2 again. If your blood sugar has come back up, go ahead and eat a snack or small meal made up of complex carbs (ex. Whole grains) and protein at this time to avoid recurrence of low blood sugar.

## 2020-04-12 NOTE — Progress Notes (Signed)
Subjective:    Patient ID: Margaret Larsen, female    DOB: [birth year], 50 y.o.   MRN: [id number]  HPI Patient is a 50 y.o. female who presents for diabetes management. She is in good spirits and presents without assistance. Patient was referred on 03/24/20 and last seen by Primary Care Provider on 10/28/19.  PMH significant for HTN, dyslipidemia.  Patient reports diabetes was diagnosed in 2008-2009.   Insurance coverage/medication affordability: Self-pay  Family/Social history: T2DM and HTN (mother), lives with grandkids and son, works at [employer]  Current diabetes medications include: insulin degludec Tyler Aas) 85 units once daily, insulin aspart (Novolog) 45 units once daily (patient reports utilizing sliding scale depending on size of meal), empagliflozin (Jardiance) 10mg  daily, dulaglutide (Trulicity) 1.5mg  once weekly Current hypertension medications include: none Current hyperlipidemia medications include: none; patient previously trialed several statins but was mostly non-adherent Patient states that She is taking her diabetes medications as prescribed. Patient denies adherence with medications as she states she purposefully skips Novolog due to the way it makes her feel and also occasionally misses her oral medications due to forgetfulness. Patient states that She misses her medications 2 times per week, on average.  Does you feel that your medications are working for you?  yes  Have you been experiencing any side effects to the medications prescribed? Yes; Novolog causes her to "not to feel right"  Do you have any problems obtaining medications due to transportation or finances?  Yes; cost as she is self-pay so limited to using certain pharmacies but does not mind    Patient reported dietary habits:  Eats 1-3 meals/day and 1-2 snacks/day; Is currently not using bolus insulin Breakfast:eggs, bacon/sausage, hashbrowns (only in weekends and not very often) Lunch: "maybe a  hamburger" Dinner: pizza; spaghetti "whatever I am feeling" Snacks:popcorn; chips; crackers Drinks:water, sweet tea (2x/week), gatorade zero sugar Patient states when I eat "I want it but then I don't want it. I will just take bites of food." She reports mainly only eating dinner, and occasionally lunch, due to work schedule.  Patient-reported exercise habits: on feet all day at work but denies deliberate exercise   Patient denies hypoglycemic events. Reports she previously would feel dizzy or unwell after taking Novolog but never checked her blood glucose to confirm she was hypoglycemic. Patient reports polyuria (increased urination).  Patient denies polyphagia (increased appetite).  Patient reports polydipsia (increased thirst).  Patient reports neuropathy (nerve pain). Patient reports visual changes. Patient reports self foot exams.   Recently picked up glucometer and has checked blood glucose (random) around 3-4x. Reports all her readings were in the mid to high 200's.   Objective:   Labs:   Lab Results  Component Value Date   HGBA1C 10.9 (A) 04/12/2020   HGBA1C 9.5 (A) 10/28/2019   HGBA1C 11.0 (A) 05/14/2019   Today's Vitals   04/12/20 0844  Weight: 208 lb 9.6 oz (94.6 kg)   Body mass index is 42.13 kg/m.  Lab Results  Component Value Date   MICRALBCREAT 0.5 08/09/2013    Lipid Panel     Component Value Date/Time   CHOL 201 (H) 05/14/2019 1010   TRIG 148 05/25/2019 1843   HDL 45 05/14/2019 1010   CHOLHDL 4.5 (H) 05/14/2019 1010   CHOLHDL 4.6 05/19/2015 0933   VLDL 27 05/19/2015 0933   LDLCALC 140 (H) 05/14/2019 1010    Clinical Atherosclerotic Cardiovascular Disease (ASCVD): No  The 10-year ASCVD risk score [name] DC Jr., et al., 2013)  is: 7.4%   Values used to calculate the score:     Age: 70 years     Sex: Female     Is Non-Hispanic African American: Yes     Diabetic: Yes     Tobacco smoker: No     Systolic Blood Pressure: 417 mmHg     Is BP  treated: No     HDL Cholesterol: 45 mg/dL     Total Cholesterol: 201 mg/dL   PHQ-9 Score: did not complete  Assessment/Plan:   T2DM is not controlled based on increase in A1C likely due to no longer bolusing meals with Novolog and consuming carb heavy dinner. Medication adherence appears suboptimal. Additional pharmacotherapy is not warranted as current regimen can be further optimized. Patient with a history of intolerance to metformin. Patient prefers to trial new mealtime insulin and therefore will switch from Novolog to Humalog. If patient tolerates Humalog will begin completing patient assistance paperwork. In future visit will consider titrating Trulicity and Jardiance. Discussed with patient difficulty of managing diabetes without blood glucose readings and asked that patient check fasting blood glucose daily and also before meals and two hours after meals at least a few times a week, which patient was agreeable to. Following instruction patient verbalized understanding of treatment plan.    1. Continued basal insulin degludec Tyler Aas) 85 units once daily.  2. Switched  rapid insulin aspart (Novolog) to insulin lispro (Humalog) 45 units once daily with largest meal.  3. Continued GLP-1 dulaglutide (Trulicity) 1.5mg  and consider increasing to 3mg  at next follow-up visit. 4. Continued SGLT2-I empagliflozin (Jardiance) 10mg  once daily and consider increasing to 25mg  at next follow-up visit.  5. Extensively discussed pathophysiology of diabetes, dietary effects on blood sugar control, and recommended lifestyle interventions,  6. Counseled on s/sx of and management of hypoglycemia 7. Next A1C anticipated 07/12/20.   ASCVD risk - primary prevention in patient with diabetes. Last LDL is not controlled. ASCVD risk score is not >20%  - moderate intensity statin indicated. Aspirin is indicated as this patient is not currently on statin. Discussed with patient benefits of statin and importance of  adherence to medications. Patient agreeable to re-visit conversation at next face-to-face visit and obtained updated labs today in office.   1. Continued aspirin 81 mg  2. Consider statin initiation at future visit  Follow-up appointment via telephone on 04/17/20 to review sugar readings. Written patient instructions provided.  This appointment required 75 minutes of patient care (this includes precharting, chart review, review of results, and face-to-face care).  Thank you for involving pharmacy to assist in providing this patient's care.

## 2020-04-13 ENCOUNTER — Other Ambulatory Visit: Payer: Self-pay

## 2020-04-13 LAB — LIPID PANEL
Chol/HDL Ratio: 4.5 ratio — ABNORMAL HIGH (ref 0.0–4.4)
Cholesterol, Total: 201 mg/dL — ABNORMAL HIGH (ref 100–199)
HDL: 45 mg/dL (ref >39)
LDL Chol Calc (NIH): 138 mg/dL — ABNORMAL HIGH (ref 0–99)
Triglycerides: 102 mg/dL (ref 0–149)
VLDL Cholesterol Cal: 18 mg/dL (ref 5–40)

## 2020-04-18 ENCOUNTER — Telehealth: Payer: Self-pay | Admitting: Pharmacist

## 2020-04-18 NOTE — Telephone Encounter (Signed)
Left message for f/u from appt on 04/12/20 to discuss Humalog.

## 2020-05-03 ENCOUNTER — Other Ambulatory Visit: Payer: Self-pay | Admitting: Family Medicine

## 2020-05-03 DIAGNOSIS — E1165 Type 2 diabetes mellitus with hyperglycemia: Secondary | ICD-10-CM

## 2020-05-03 DIAGNOSIS — IMO0002 Reserved for concepts with insufficient information to code with codable children: Secondary | ICD-10-CM

## 2020-05-29 ENCOUNTER — Ambulatory Visit: Payer: Self-pay | Admitting: Pharmacist

## 2020-06-01 ENCOUNTER — Other Ambulatory Visit: Payer: Self-pay | Admitting: Family Medicine

## 2020-06-01 DIAGNOSIS — J302 Other seasonal allergic rhinitis: Secondary | ICD-10-CM

## 2020-06-02 ENCOUNTER — Telehealth (INDEPENDENT_AMBULATORY_CARE_PROVIDER_SITE_OTHER): Payer: Self-pay | Admitting: Pharmacist

## 2020-06-02 ENCOUNTER — Other Ambulatory Visit: Payer: Self-pay

## 2020-06-02 DIAGNOSIS — E118 Type 2 diabetes mellitus with unspecified complications: Secondary | ICD-10-CM

## 2020-06-02 DIAGNOSIS — E1165 Type 2 diabetes mellitus with hyperglycemia: Secondary | ICD-10-CM

## 2020-06-02 MED ORDER — EMPAGLIFLOZIN 10 MG PO TABS: 10.0000 mg | ORAL_TABLET | Freq: Every day | ORAL | 0 refills | Status: DC

## 2020-06-02 MED ORDER — TRULICITY 3 MG/0.5ML ~~LOC~~ SOAJ: 3.0000 mg | SUBCUTANEOUS | 2 refills | Status: DC

## 2020-06-02 NOTE — Progress Notes (Signed)
Subjective:    Patient ID: Margaret Larsen, female    DOB: 06-Jul-1970, 50 y.o.   MRN: [id number]  HPI Patient is a 50 y.o. female who presents for diabetes management. Appointment conducted via telephone due to issues with connectivity. Patient was referred on 03/24/20 and last seen by Primary Care Provider on 10/28/19.  Insurance coverage/medication affordability: self-pay  Current diabetes medications include: insulin degludec Tyler Aas) 86 units once daily, empagliflozin (Jardiance) 10mg , dulaglutide (Trulicity) 1.5mg  once weekly, insulin aspart (Novolog) 45 units TID with meals (currently taking 20-35 units) Patient states that She is taking her diabetes medications as prescribed but will skip her Novolog from time to time as she states she feels it brings her blood glucose too low making her feel "funny." Patient reports adherence with medications, however, she states she is out of her Jardiance and did not request a refill.  Does you feel that your medications are working for you?  yes  Have you been experiencing any side effects to the medications prescribed? Yes; reports that she did not like the was Humalog made her feel so she switched back to Novolog  Do you have any problems obtaining medications due to transportation or finances?  Yes; self-pay so cost can be a limiting factor    Patient reported dietary habits: Many days she will skip breakfast and lunch due to her work schedule and not having enough time  Patient reports hypoglycemic events. Patient reports polyuria (increased urination).  Patient denies polyphagia (increased appetite).  Patient reports polydipsia (increased thirst).  Patient denies neuropathy (nerve pain). Patient reports visual changes. Patient reports self foot exams.   Home fasting blood sugars: high 180's-mid 200's; lowest 105 2 hour post-meal/random blood sugars: 200-300's; highest 457 states this is because of what she ate  Objective:   Labs:    Lab Results  Component Value Date   HGBA1C 10.9 (A) 04/12/2020   HGBA1C 9.5 (A) 10/28/2019   HGBA1C 11.0 (A) 05/14/2019    Lab Results  Component Value Date   MICRALBCREAT 0.5 08/09/2013    Lipid Panel     Component Value Date/Time   CHOL 201 (H) 04/12/2020 0949   TRIG 102 04/12/2020 0949   HDL 45 04/12/2020 0949   CHOLHDL 4.5 (H) 04/12/2020 0949   CHOLHDL 4.6 05/19/2015 0933   VLDL 27 05/19/2015 0933   LDLCALC 138 (H) 04/12/2020 0949    Clinical Atherosclerotic Cardiovascular Disease (ASCVD): No  The 10-year ASCVD risk score [name] DC Jr., et al., 2013) is: 7.4%   Values used to calculate the score:     Age: 7 years     Sex: Female     Is Non-Hispanic African American: Yes     Diabetic: Yes     Tobacco smoker: No     Systolic Blood Pressure: 761 mmHg     Is BP treated: No     HDL Cholesterol: 45 mg/dL     Total Cholesterol: 201 mg/dL    Assessment/Plan:   T2DM is not controlled likely due to lack of follow-up for further management and adjustment of medications. Medication adherence appears suboptimal. Additional pharmacotherapy is not warranted. Patient with a history of intolerance to Humalog. Will increase Trulicity from 1.5mg  to 3mg  once weekly and also increase Tresiba from 86 units to 90 units (5%). Following instruction patient verbalized understanding of treatment plan.    1. Increased dose of basal insulin degludec Tyler Aas) to 90 units once daily. . 2. Continued  rapid insulin aspart (  Novolog) 20-35 units TID with meals.  3. Increased dose of GLP-1 dulaglutide (Trulicity) 3mg  once weekly.   4. Restarted SGLT2-I empagliflozin (Jardiance) 10mg  5. Extensively discussed pathophysiology of diabetes, dietary effects on blood sugar control, and recommended lifestyle interventions,  6. Counseled on s/sx of and management of hypoglycemia 7. Next A1C anticipated July 2022.   Sent message to scheduling team to make follow-up appointment in two weeks with PCP to  review sugar readings. [name] work schedule makes it challenging to make appts but stressed importance of continuing to follow-up with PCP and/or pharmacy team on a regular basis. Written patient instructions provided.  This appointment required 40 minutes of patient care (this includes precharting, chart review, review of results, and face-to-face care).  Thank you for involving pharmacy to assist in providing this patient's care.

## 2020-06-04 NOTE — Patient Instructions (Signed)
Margaret Larsen it was a pleasure seeing you today.   Please do the following:  1. Increase Tresiba to 90 units once daily as directed today during your appointment. If you have any questions or if you believe something has occurred because of this change, call me or your doctor to let one of [place] know.  2. I sent in prescriptions for Jardiance and an increased dose of Trulicity. You will begin taking Trulicity 3mg  once weekly and this will replace your current dose of Trulicity 1.5mg  once weekly. 3. Continue checking blood sugars at home. It's really important that you record these and bring these in to your next doctor's appointment.  4. Continue making the lifestyle changes we've discussed together during our visit. Diet and exercise play a significant role in improving your blood sugars.  5. I reached out to scheduling to have you follow-up with your PCP.   Hypoglycemia or low blood sugar:   Low blood sugar can happen quickly and may become an emergency if not treated right away.   While this shouldn't happen often, it can be brought upon if you skip a meal or do not eat enough. Also, if your insulin or other diabetes medications are dosed too high, this can cause your blood sugar to go to low.   Warning signs of low blood sugar include: 1. Feeling shaky or dizzy 2. Feeling weak or tired  3. Excessive hunger 4. Feeling anxious or upset  5. Sweating even when you aren't exercising  What to do if I experience low blood sugar? Follow the Rule of 15 1. Check your blood sugar with your meter. If lower than 70, proceed to step 2.  2. Treat with 15 grams of fast acting carbs which is found in 3-4 glucose tablets. If none are available you can try hard candy, 1 tablespoon of sugar or honey,4 ounces of fruit juice, or 6 ounces of REGULAR soda.  3. Re-check your sugar in 15 minutes. If it is still below 70, do what you did in step 2 again. If your blood sugar has come back up, go ahead and eat a  snack or small meal made up of complex carbs (ex. Whole grains) and protein at this time to avoid recurrence of low blood sugar.

## 2020-07-14 ENCOUNTER — Telehealth: Payer: Self-pay | Admitting: Pharmacist

## 2020-07-14 NOTE — Telephone Encounter (Signed)
Patient called stating she was returning my missed call from 06/20/20. Discussed with patient that this was regarding having her come back into clinic to be seen for DM follow-up. Patient states she would like to come back into clinic but she is also requesting diflucan going into the weekend. She states she is having vaginal itching. Discussed with patient that I will route message to her new PCP.   Patient unable to come in for appt for DM management until 07/28/20. Scheduled appt for that day with pharmacy clinic at 1:30PM.

## 2020-07-19 ENCOUNTER — Ambulatory Visit: Payer: Self-pay

## 2020-07-19 ENCOUNTER — Other Ambulatory Visit: Payer: Self-pay | Admitting: Family Medicine

## 2020-07-19 MED ORDER — FLUCONAZOLE 150 MG PO TABS: 150.0000 mg | ORAL_TABLET | Freq: Once | ORAL | 0 refills | Status: AC

## 2020-07-19 NOTE — Progress Notes (Signed)
Patient called pharmacist requesting Diflucan for vaginal itch which was forwarded to me.  She has an appointment scheduled with pharmacy on 7/22 for DM management.  Sending in for Diflucan 150 mg once

## 2020-07-26 ENCOUNTER — Encounter (HOSPITAL_COMMUNITY): Payer: Self-pay | Admitting: Emergency Medicine

## 2020-07-26 ENCOUNTER — Other Ambulatory Visit: Payer: Self-pay

## 2020-07-26 ENCOUNTER — Emergency Department (HOSPITAL_COMMUNITY)
Admission: EM | Admit: 2020-07-26 | Discharge: 2020-07-26 | Disposition: A | Discharge status: Home / Self Care | Payer: Self-pay | Attending: Student | Admitting: Student

## 2020-07-26 DIAGNOSIS — Z8616 Personal history of COVID-19: Secondary | ICD-10-CM | POA: Insufficient documentation

## 2020-07-26 DIAGNOSIS — R6889 Other general symptoms and signs: Secondary | ICD-10-CM

## 2020-07-26 DIAGNOSIS — Z7984 Long term (current) use of oral hypoglycemic drugs: Secondary | ICD-10-CM | POA: Insufficient documentation

## 2020-07-26 DIAGNOSIS — Z28310 Unvaccinated for covid-19: Secondary | ICD-10-CM | POA: Insufficient documentation

## 2020-07-26 DIAGNOSIS — M791 Myalgia, unspecified site: Secondary | ICD-10-CM | POA: Insufficient documentation

## 2020-07-26 DIAGNOSIS — Z91010 Allergy to peanuts: Secondary | ICD-10-CM | POA: Insufficient documentation

## 2020-07-26 DIAGNOSIS — Z20822 Contact with and (suspected) exposure to covid-19: Secondary | ICD-10-CM | POA: Insufficient documentation

## 2020-07-26 DIAGNOSIS — R059 Cough, unspecified: Secondary | ICD-10-CM | POA: Insufficient documentation

## 2020-07-26 DIAGNOSIS — Z794 Long term (current) use of insulin: Secondary | ICD-10-CM | POA: Insufficient documentation

## 2020-07-26 DIAGNOSIS — R509 Fever, unspecified: Secondary | ICD-10-CM | POA: Insufficient documentation

## 2020-07-26 DIAGNOSIS — J029 Acute pharyngitis, unspecified: Secondary | ICD-10-CM | POA: Insufficient documentation

## 2020-07-26 DIAGNOSIS — I10 Essential (primary) hypertension: Secondary | ICD-10-CM | POA: Insufficient documentation

## 2020-07-26 DIAGNOSIS — E119 Type 2 diabetes mellitus without complications: Secondary | ICD-10-CM | POA: Insufficient documentation

## 2020-07-26 MED ORDER — BENZONATATE 100 MG PO CAPS: 100.0000 mg | ORAL_CAPSULE | Freq: Three times a day (TID) | ORAL | 0 refills | Status: DC

## 2020-07-26 NOTE — ED Provider Notes (Signed)
[facility] DEPT Provider Note   CSN: [id number] Arrival date & time: 07/26/20  1333     History Chief Complaint  Patient presents with   Cough   Generalized Body Aches    Margaret Larsen is a 50 y.o. female.  Patient presents with cough, body aches, sore throat, fevers x2 days.  States it feels like when she had COVID 4 months ago.  She is not having any difficulty breathing or chest pain.  She is not vaccinated.  No known sick contacts.  Has tried Tylenol with moderate relief.    Past Medical History:  Diagnosis Date   Cough 05/13/2016   Diabetes mellitus    Hemorrhoids 07/10/2010   Hypertension    Kidney stones    Kidney stones    Myalgia and myositis 06/19/2015   Renal disorder     Patient Active Problem List   Diagnosis Date Noted   No-show for appointment 12/19/2019   COVID-19 05/20/2019   Healthcare maintenance 05/19/2019   Muscle fatigue 05/18/2019   Left upper extremity swelling 12/08/2018   Sore throat 03/13/2017   Vertigo 06/21/2016   Abscess of gluteal region 04/19/2016   Panic attack 02/06/2016   Hair loss 12/13/2015   Allergic rhinitis 04/25/2015   Hyperlipidemia 11/03/2014   Irreducible ventral hernia 10/25/2014   Atypical squamous cells of undetermined significance (ASCUS) on Papanicolaou smear of cervix 03/22/2013   At risk for healthcare associated infection 03/15/2013   GERD (gastroesophageal reflux disease) 11/13/2012   Glaucoma suspect of both eyes 06/25/2012   OSA (obstructive sleep apnea) 10/18/2010   Diarrhea 02/13/2010   Diabetic peripheral neuropathy ([place]) 01/23/2010   PERSONAL HISTORY OF ALLERGY TO PEANUTS 11/02/2009   PTSD 07/31/2009   Uncontrolled type 2 diabetes mellitus with complication ([place]) 68/11/5724   HYPERTENSION, BENIGN ESSENTIAL 11/12/2007   DYSLIPIDEMIA 10/02/2007   Bipolar disorder ([place]) 08/11/2007   OBESITY, NOS 03/06/2006    Past Surgical History:  Procedure Laterality Date    CESAREAN SECTION     CHOLECYSTECTOMY     fiborids removed     TUBAL LIGATION       OB History   No obstetric history on file.     Family History  Problem Relation Age of Onset   Hypertension Mother    Diabetes Mother    Cancer Mother     Social History   Tobacco Use   Smoking status: Never   Smokeless tobacco: Never  Vaping Use   Vaping Use: Never used  Substance Use Topics   Alcohol use: No   Drug use: No    Home Medications Prior to Admission medications   Medication Sig Start Date End Date Taking? Authorizing Provider  aspirin 81 MG EC tablet Take 81 mg by mouth daily. Reported on 05/19/2015    [provider]  Blood Glucose Monitoring Suppl (AGAMATRIX PRESTO PRO METER) DEVI 1 Device by Does not apply route once. Patient not taking: Reported on 06/04/2020 06/30/14   Zenia Resides, MD  Blood Glucose Monitoring Suppl (TRUE METRIX METER) w/Device KIT Please use to check CBGs fasting and 2 hours after largest meal. 03/28/20   Patriciaann Clan, DO  cetirizine (ZYRTEC) 10 MG tablet Take 1 tablet (10 mg total) by mouth daily. Patient not taking: No sig reported 07/13/18   Patriciaann Clan, DO  Dulaglutide (TRULICITY) 3 OM/3.5DH SOPN Inject 3 mg as directed once a week. 06/02/20   Patriciaann Clan, DO  empagliflozin (JARDIANCE) 10 MG  TABS tablet Take 1 tablet (10 mg total) by mouth daily. 06/02/20   Patriciaann Clan, DO  EPINEPHrine (EPI-PEN) 0.3 mg/0.3 mL DEVI Inject 0.3 mLs (0.3 mg total) into the muscle as needed. For allergic reaction Patient not taking: No sig reported 08/05/11   Katherina Mires, MD  glucose blood test strip Use as instructed 03/29/20   Patriciaann Clan, DO  glucose blood test strip USE AS INSTRUCTED 03/29/20 03/29/21  Patriciaann Clan, DO  insulin aspart (NOVOLOG) 100 UNIT/ML injection Inject 45 Units into the skin 3 (three) times daily before meals.    [provider]  Insulin Syringe-Needle U-100 (INSULIN SYRINGE .3CC/31GX5/16") 31G X  5/16" 0.3 ML MISC Use 3 needles per day 06/28/13   Elayne Snare, MD  mometasone (NASONEX) 50 MCG/ACT nasal spray SPRAY 2 SPRAYS INTO THE NOSE DAILY. Patient not taking: No sig reported 06/16/19   Patriciaann Clan, DO  PROVENTIL HFA 108 (90 Base) MCG/ACT inhaler INHALE 2 PUFFS INTO THE LUNGS EVERY 6 HOURS AS NEEDED FOR WHEEZING. Patient not taking: Reported on 06/02/2020 06/01/20   Patriciaann Clan, DO  TRESIBA FLEXTOUCH 200 UNIT/ML FlexTouch Pen INJECT 86 UNITS INTO THE SKIN EVERY MORNING. 05/04/20   Patriciaann Clan, DO  TRUEplus Lancets 28G MISC Use to check glucose levels twice daily as directed. 03/29/20   Patriciaann Clan, DO  TRUEplus Lancets 28G MISC USE TO CHECK GLUCOSE LEVELS TWICE DAILY AS DIRECTED. 03/29/20 03/29/21  Patriciaann Clan, DO    Allergies    Metformin and related, Peanut-containing drug products, Codeine, Diphenhydramine hcl, Fexofenadine-pseudoephed er, Latuda [lurasidone hcl], and Shellfish allergy  Review of Systems   Review of Systems  Respiratory:  Positive for cough.    Physical Exam Updated Vital Signs BP (!) 171/110   Pulse 93   Temp 98.5 F (36.9 C) (Oral)   Resp 18   SpO2 97%   Physical Exam Vitals and nursing note reviewed. Exam conducted with a chaperone present.  Constitutional:      General: She is not in acute distress.    Appearance: Normal appearance.  HENT:     Head: Normocephalic and atraumatic.     Mouth/Throat:     Pharynx: Posterior oropharyngeal erythema present. No oropharyngeal exudate.  Eyes:     General: No scleral icterus.    Extraocular Movements: Extraocular movements intact.     Pupils: Pupils are equal, round, and reactive to light.  Cardiovascular:     Rate and Rhythm: Normal rate and regular rhythm.  Pulmonary:     Effort: Pulmonary effort is normal.     Breath sounds: Normal breath sounds.  Skin:    Coloration: Skin is not jaundiced.  Neurological:     Mental Status: She is alert. Mental status is at baseline.      Coordination: Coordination normal.    ED Results / Procedures / Treatments   Labs (all labs ordered are listed, but only abnormal results are displayed) Labs Reviewed - No data to display  EKG None  Radiology No results found.  Procedures Procedures   Medications Ordered in ED Medications - No data to display  ED Course  I have reviewed the triage vital signs and the nursing notes.  Pertinent labs & imaging results that were available during my care of the patient were reviewed by me and considered in my medical decision making (see chart for details).    MDM Rules/Calculators/A&P  Patient vitals are stable, although she is mildly hypertensive.  She has viral symptoms, will test for COVID.  Symptomatic management discussed with the patient.  Return precautions given. Final Clinical Impression(s) / ED Diagnoses Final diagnoses:  None    Rx / DC Orders ED Discharge Orders     None        Sonnie Alamo 07/26/20 2135    Lacretia Leigh, MD 08/02/20 1258

## 2020-07-26 NOTE — Discharge Instructions (Addendum)
Please take Tessalon Perles every 8 hours as needed for cough.  Continue to drink water and stay hydrated.  If your COVID test results positive, gross you have a call within the next 24 hours.  If it is negative you will not hear from the hospital.  If it is positive, please stay quarantine for 5 days.  After that you can return back to work but please wear a mask for 5 days after that.  Your blood pressure was also high in visit today.  Please follow-up with a primary care doctor for blood pressure recheck in the next week.

## 2020-07-26 NOTE — ED Triage Notes (Signed)
Patient c/o cough, congestion, body aches, and chills x2 days.

## 2020-07-27 LAB — SARS CORONAVIRUS 2 (TAT 6-24 HRS): SARS Coronavirus 2: NEGATIVE

## 2020-07-28 ENCOUNTER — Ambulatory Visit: Payer: Self-pay | Admitting: Pharmacist

## 2020-07-28 ENCOUNTER — Encounter: Payer: Self-pay | Admitting: Family Medicine

## 2020-07-28 ENCOUNTER — Other Ambulatory Visit: Payer: Self-pay

## 2020-07-28 ENCOUNTER — Ambulatory Visit (INDEPENDENT_AMBULATORY_CARE_PROVIDER_SITE_OTHER): Payer: Self-pay | Admitting: Family Medicine

## 2020-07-28 VITALS — BP 141/91 | HR 86

## 2020-07-28 DIAGNOSIS — J189 Pneumonia, unspecified organism: Secondary | ICD-10-CM

## 2020-07-28 DIAGNOSIS — R059 Cough, unspecified: Secondary | ICD-10-CM

## 2020-07-28 MED ORDER — AZITHROMYCIN 250 MG PO TABS: ORAL_TABLET | ORAL | 0 refills | Status: DC

## 2020-07-28 NOTE — Patient Instructions (Signed)
I have prescribed an antibiotic called azithromycin to treat you for what I think is likely a pneumonia.  Please take 2 tablets today and then take 1 tablet daily for the next 4 days.   Please plan to follow-up with your primary doctor in the next 1 to 2 weeks to follow-up on your breathing as well as check on your diabetes.

## 2020-07-28 NOTE — Progress Notes (Signed)
    SUBJECTIVE:   CHIEF COMPLAINT / HPI: sore throat and cough  Symptoms started Sunday and include cough, sore throat, HA intermittently, productive cough and rhinorrhea that is both mucous like and clear.  She reports not having any fevers.  She reports going to the ED on Wednesday. Patient reports that she had covid before and results at that time were negative for covid 07/26/20 She reports two days of diarrhea that has since resolved  She reports that she has not had body aches but did have some chills  She reports not having any stuffy nose or head congestion.  Feels some SOB with walking    PERTINENT  PMH / PSH:  OSA Diabetes History of COVID  OBJECTIVE:   BP (!) 141/91   Pulse 86   SpO2 97%   General: female appearing stated age in no acute distress Cardio: Normal S1 and S2, no S3 or S4. Rhythm is regular. No murmurs or rubs.  Bilateral radial pulses palpable Pulm: bilateral crackles at bases, no wheezing, Normal respiratory effort, stable on RA, frequently coughing throughout exam  Abdomen: Bowel sounds normal. Abdomen soft and non-tender.  Extremities: No peripheral edema. Warm/ well perfused.    ASSESSMENT/PLAN:   Cough Given patient's history of diabetes, with frequent cough that has been worsening, will treat patient empirically for community-acquired pneumonia with azithromycin for 5-day course.  Has not noticed much improvement with albuterol inhaler. ED precautions given, patient voiced understanding Patient to follow-up in 1-2 weeks for diabetes     Eulis Foster, MD [place]

## 2020-07-30 LAB — NOVEL CORONAVIRUS, NAA: SARS-CoV-2, NAA: NOT DETECTED

## 2020-07-30 LAB — SARS-COV-2, NAA 2 DAY TAT

## 2020-07-31 DIAGNOSIS — J189 Pneumonia, unspecified organism: Secondary | ICD-10-CM | POA: Insufficient documentation

## 2020-07-31 NOTE — Assessment & Plan Note (Deleted)
Given patient's history of diabetes, with frequent cough that has been worsening, will treat patient empirically for community-acquired pneumonia with azithromycin for 5-day course. ED precautions given, patient voiced understanding Patient to follow-up in 1-2 weeks for diabetes

## 2020-07-31 NOTE — Assessment & Plan Note (Signed)
Given patient's history of diabetes, with frequent cough that has been worsening, will treat patient empirically for community-acquired pneumonia with azithromycin for 5-day course.  Has not noticed much improvement with albuterol inhaler. ED precautions given, patient voiced understanding Patient to follow-up in 1-2 weeks for diabetes

## 2020-08-10 ENCOUNTER — Other Ambulatory Visit: Payer: Self-pay | Admitting: Family Medicine

## 2020-08-10 DIAGNOSIS — E1165 Type 2 diabetes mellitus with hyperglycemia: Secondary | ICD-10-CM

## 2020-08-10 DIAGNOSIS — IMO0002 Reserved for concepts with insufficient information to code with codable children: Secondary | ICD-10-CM

## 2020-08-11 ENCOUNTER — Ambulatory Visit (INDEPENDENT_AMBULATORY_CARE_PROVIDER_SITE_OTHER): Payer: Self-pay | Admitting: Family Medicine

## 2020-08-11 ENCOUNTER — Encounter: Payer: Self-pay | Admitting: Family Medicine

## 2020-08-11 ENCOUNTER — Other Ambulatory Visit: Payer: Self-pay

## 2020-08-11 VITALS — BP 104/102 | HR 94 | Ht 59.0 in | Wt 200.2 lb

## 2020-08-11 DIAGNOSIS — E118 Type 2 diabetes mellitus with unspecified complications: Secondary | ICD-10-CM

## 2020-08-11 DIAGNOSIS — I10 Essential (primary) hypertension: Secondary | ICD-10-CM

## 2020-08-11 DIAGNOSIS — E1165 Type 2 diabetes mellitus with hyperglycemia: Secondary | ICD-10-CM

## 2020-08-11 DIAGNOSIS — IMO0002 Reserved for concepts with insufficient information to code with codable children: Secondary | ICD-10-CM

## 2020-08-11 LAB — POCT GLYCOSYLATED HEMOGLOBIN (HGB A1C): HbA1c, POC (controlled diabetic range): 11.5 % — AB (ref 0.0–7.0)

## 2020-08-11 NOTE — Progress Notes (Signed)
    SUBJECTIVE:   CHIEF COMPLAINT / HPI:   Type 2 diabetes check Patient reports that she is taking all her medications as prescribed including Tresiba 96 units, NovoLog 45 units with her meals (when she does have a meal), Trulicity, Jardiance.  Reports that she has had an ophthalmology exam last year and thinks that she may be due for one pretty soon.  She thinks that it was completed at the [facility] though unsure if we have any records at this currently.  Has been following up with Dr. Georgina Peer but notes that she has difficulty with making the phone calls them, no appointments sometimes due to her schedule at the [facility] and taking care of the children.  Social situation Patient has had difficulty with taking care of herself and doing dietary and physical exercise changes as recommended previously.  She is currently taking care of of her 3 grandchildren who are aged 83 months, 54 years, 52 years old.  She is assisted by her son  PERTINENT  PMH / PSH: Reviewed  OBJECTIVE:   BP (!) 104/102   Pulse 94   Ht '4\' 11"'$  (1.499 m)   Wt 200 lb 3.2 oz (90.8 kg)   SpO2 97%   BMI 40.44 kg/m   Gen: well-appearing, NAD CV: RRR, no m/r/g appreciated, no peripheral edema Pulm: CTAB, no wheezes/crackles GI: soft, non-tender, non-distended  ASSESSMENT/PLAN:   Uncontrolled type 2 diabetes mellitus with complication (HCC) [name] A1c has continued to increase and is currently 11.5.  She is still having lack of p.o. intake due to increased appetite.  Currently on NovoLog 45 units with her largest meal, Tresiba 96 units, Trulicity, Jardiance 10 mg.  Unsure if patient is having issues with compliance due to social situation and having to take care of several children and work.  Highly recommended that patient follow-up with pharmacy team (Dr. Georgina Peer, with whom this was discussed already) as able to try and better control her medications.  Does have financial aid restriction as she has no insurance  currently. -Recommend following up with BMP in the next month, was unable to obtain due to timing in the day with the lab.     Margaret Larsen, [place]

## 2020-08-11 NOTE — Patient Instructions (Addendum)
Your A1c was elevated to 11.5, it will be very important to keep taking your current medications. I want you to make sure that you follow-up with Dr. Georgina Peer to get better control of your diabetes. I will let her know so she can get back in contact with you. We will likely have you come back to the clinic for labs in the next month.

## 2020-08-11 NOTE — Assessment & Plan Note (Signed)
[name] A1c has continued to increase and is currently 11.5.  She is still having lack of p.o. intake due to increased appetite.  Currently on NovoLog 45 units with her largest meal, Tresiba 96 units, Trulicity, Jardiance 10 mg.  Unsure if patient is having issues with compliance due to social situation and having to take care of several children and work.  Highly recommended that patient follow-up with pharmacy team (Dr. Georgina Peer, with whom this was discussed already) as able to try and better control her medications.  Does have financial aid restriction as she has no insurance currently. -Recommend following up with [facility] in the next month, was unable to obtain due to timing in the day with the lab.

## 2020-09-12 ENCOUNTER — Other Ambulatory Visit: Payer: Self-pay | Admitting: Family Medicine

## 2020-09-12 DIAGNOSIS — E1165 Type 2 diabetes mellitus with hyperglycemia: Secondary | ICD-10-CM

## 2020-09-12 DIAGNOSIS — IMO0002 Reserved for concepts with insufficient information to code with codable children: Secondary | ICD-10-CM

## 2020-09-13 ENCOUNTER — Other Ambulatory Visit: Payer: Self-pay | Admitting: Family Medicine

## 2020-09-13 ENCOUNTER — Telehealth: Payer: Self-pay

## 2020-09-13 DIAGNOSIS — J302 Other seasonal allergic rhinitis: Secondary | ICD-10-CM

## 2020-09-13 DIAGNOSIS — E1165 Type 2 diabetes mellitus with hyperglycemia: Secondary | ICD-10-CM

## 2020-09-13 DIAGNOSIS — IMO0002 Reserved for concepts with insufficient information to code with codable children: Secondary | ICD-10-CM

## 2020-09-13 MED ORDER — INSULIN ASPART 100 UNIT/ML IJ SOLN: 45.0000 [IU] | Freq: Three times a day (TID) | INTRAMUSCULAR | 3 refills | Status: DC

## 2020-09-13 MED ORDER — TRESIBA FLEXTOUCH 200 UNIT/ML ~~LOC~~ SOPN: 96.0000 [IU] | PEN_INJECTOR | Freq: Every morning | SUBCUTANEOUS | 3 refills | Status: AC

## 2020-09-13 NOTE — Progress Notes (Signed)
Received note from RN that patient's pharmacy is requesting 30 day refills of Tresiba and Novolog instead of the current 6 days and 14 days respectively. Adjusted rx to 30 days.

## 2020-09-13 NOTE — Telephone Encounter (Signed)
Received phone call from [facility] regarding recent refills.   Pharmacist is requesting 30 day supply of Tresiba, current rx is for 6 days. Also requesting 30 day supply of Novolog, current rx is for 14 days.   Please update rx if appropriate.   Talbot Grumbling, RN

## 2020-09-13 NOTE — Progress Notes (Signed)
    SUBJECTIVE:   CHIEF COMPLAINT / HPI: vaginal itching   Vaginal Pruritis  Patient reports that she has been having a foul odor coming from her vagina. She reports that the smell is intermittent. She reports having some pruritis as well. She states that when her blood sugar levels are elevated, she often has yeast infections. She presents for testing. She reports that she has not been sexually active for 4-5 months. She denies presence of discharge. Her latest BG measures ranged from 200-300s and has been up to 400.   PERTINENT  PMH / PSH:  T2DM  OBJECTIVE:   BP (!) 146/100   Pulse 89   Wt 197 lb 12.8 oz (89.7 kg)   SpO2 97%   BMI 39.95 kg/m   Genitalia:  Normal introitus for age, no external lesions, minimal white,thin vaginal discharge, mucosa pink and moist, no vaginal or cervical lesions, no vaginal atrophy, no friaility or hemorrhage   ASSESSMENT/PLAN:   Vaginal pruritus Patient presents with vaginal pruritus and abnormal vaginal odor/foul-smelling urine.  We will complete urine analysis today as well as wet prep to look for any signs of bacterial vaginitis or urinary tract infection.  Patient also agreeable to sexually-transmitted infection testing including gonorrhea and chlamydia.  GC probe sent for testing.     Eulis Foster, MD [place]

## 2020-09-14 ENCOUNTER — Other Ambulatory Visit: Payer: Self-pay

## 2020-09-14 ENCOUNTER — Other Ambulatory Visit (HOSPITAL_COMMUNITY)
Admission: RE | Admit: 2020-09-14 | Discharge: 2020-09-14 | Disposition: A | Discharge status: Home / Self Care | Payer: Self-pay | Source: Ambulatory Visit | Attending: Family Medicine | Admitting: Family Medicine

## 2020-09-14 ENCOUNTER — Ambulatory Visit (INDEPENDENT_AMBULATORY_CARE_PROVIDER_SITE_OTHER): Payer: Self-pay | Admitting: Family Medicine

## 2020-09-14 VITALS — BP 146/100 | HR 89 | Wt 197.8 lb

## 2020-09-14 DIAGNOSIS — N898 Other specified noninflammatory disorders of vagina: Secondary | ICD-10-CM

## 2020-09-14 LAB — POCT WET PREP (WET MOUNT)
Clue Cells Wet Prep Whiff POC: NEGATIVE
Trichomonas Wet Prep HPF POC: ABSENT

## 2020-09-14 MED ORDER — FLUCONAZOLE 150 MG PO TABS: 150.0000 mg | ORAL_TABLET | Freq: Once | ORAL | 0 refills | Status: DC

## 2020-09-14 NOTE — Assessment & Plan Note (Signed)
Patient presents with vaginal pruritus and abnormal vaginal odor/foul-smelling urine.  We will complete urine analysis today as well as wet prep to look for any signs of bacterial vaginitis or urinary tract infection.  Patient also agreeable to sexually-transmitted infection testing including gonorrhea and chlamydia.  GC probe sent for testing.

## 2020-09-14 NOTE — Patient Instructions (Addendum)
Today we collected a vaginal swab and did testing for any sexually transmitted infection.  I will follow-up with you once the results are available.  It will be important for you to follow-up with your primary physician to discuss any changes for your diabetes regimen to help with your blood sugar control.

## 2020-09-15 ENCOUNTER — Other Ambulatory Visit: Payer: Self-pay

## 2020-09-15 MED ORDER — FLUCONAZOLE 150 MG PO TABS: 150.0000 mg | ORAL_TABLET | Freq: Once | ORAL | 0 refills | Status: AC

## 2020-09-15 NOTE — Telephone Encounter (Signed)
Patient calls nurse line asking which pharmacy diflucan was sent to. Rx was originally sent to [facility]. Patient states that she wants medication to be transferred to [facility].   Called and canceled rx with [facility]. Resent rx to [facility].   Talbot Grumbling, RN

## 2020-09-18 LAB — CERVICOVAGINAL ANCILLARY ONLY
Chlamydia: NEGATIVE
Comment: NEGATIVE
Comment: NORMAL
Neisseria Gonorrhea: NEGATIVE

## 2020-10-11 ENCOUNTER — Other Ambulatory Visit: Payer: Self-pay | Admitting: Family Medicine

## 2020-10-11 DIAGNOSIS — J302 Other seasonal allergic rhinitis: Secondary | ICD-10-CM

## 2020-10-18 ENCOUNTER — Telehealth: Payer: Self-pay | Admitting: Pharmacist

## 2020-10-18 NOTE — Telephone Encounter (Signed)
Left message requesting patient call back to make another appointment with me at her earliest convenience for diabetes management

## 2020-11-12 ENCOUNTER — Emergency Department (HOSPITAL_COMMUNITY)
Admission: EM | Admit: 2020-11-12 | Discharge: 2020-11-12 | Disposition: A | Discharge status: Home / Self Care | Payer: Self-pay | Attending: Student | Admitting: Student

## 2020-11-12 DIAGNOSIS — R6889 Other general symptoms and signs: Secondary | ICD-10-CM

## 2020-11-12 DIAGNOSIS — Z7982 Long term (current) use of aspirin: Secondary | ICD-10-CM | POA: Insufficient documentation

## 2020-11-12 DIAGNOSIS — J101 Influenza due to other identified influenza virus with other respiratory manifestations: Secondary | ICD-10-CM | POA: Insufficient documentation

## 2020-11-12 DIAGNOSIS — E1140 Type 2 diabetes mellitus with diabetic neuropathy, unspecified: Secondary | ICD-10-CM | POA: Insufficient documentation

## 2020-11-12 DIAGNOSIS — Z8616 Personal history of COVID-19: Secondary | ICD-10-CM | POA: Insufficient documentation

## 2020-11-12 DIAGNOSIS — Z7984 Long term (current) use of oral hypoglycemic drugs: Secondary | ICD-10-CM | POA: Insufficient documentation

## 2020-11-12 DIAGNOSIS — Z20822 Contact with and (suspected) exposure to covid-19: Secondary | ICD-10-CM | POA: Insufficient documentation

## 2020-11-12 DIAGNOSIS — Z794 Long term (current) use of insulin: Secondary | ICD-10-CM | POA: Insufficient documentation

## 2020-11-12 DIAGNOSIS — Z91010 Allergy to peanuts: Secondary | ICD-10-CM | POA: Insufficient documentation

## 2020-11-12 DIAGNOSIS — I10 Essential (primary) hypertension: Secondary | ICD-10-CM | POA: Insufficient documentation

## 2020-11-12 LAB — RESP PANEL BY RT-PCR (FLU A&B, COVID) ARPGX2
Influenza A by PCR: POSITIVE — AB
Influenza B by PCR: NEGATIVE
SARS Coronavirus 2 by RT PCR: NEGATIVE

## 2020-11-12 MED ORDER — BENZONATATE 100 MG PO CAPS: 100.0000 mg | ORAL_CAPSULE | Freq: Three times a day (TID) | ORAL | 0 refills | Status: DC

## 2020-11-12 NOTE — Discharge Instructions (Addendum)
Expect URI symptoms to last for 14-21 days. Dry cough can last up to 4 weeks. Covid test will result in 24 hours, check on my chart. You should receive call if result is positive. To help yourself recover, drink plenty of fluids and get plenty of rest.   For fever, HA, sore throat or body aches - take tylenol (500-100 mg) every 8 hours. Do not exceed 3000mg /day. You ca also take ibuprofen.   For cough, take tessalon perles every 8 hours as needed. You can also use cough drops or drink honey.  Return if things worsen or change.

## 2020-11-12 NOTE — ED Triage Notes (Signed)
Patient reports chills/bodyaches since Friday. Temp at home 100.4. took nyquil. Loss of appetite. Pain rated 8/10. States she has been around sick contacts.

## 2020-11-12 NOTE — ED Provider Notes (Signed)
[facility] DEPT Provider Note   CSN: [id number] Arrival date & time: 11/12/20  1134     History Chief Complaint  Patient presents with   Generalized Body Aches   Chills    Margaret Larsen is a 50 y.o. female.  HPI  Viral symptoms x3 days.  She has cough, sore throat, body aches, chills, decreased appetite.  She is trying DayQuil and NyQuil which is helped a little bit.  The symptoms have been constant, she has been around sick contacts.  Not flu or COVID vaccinated.  Denies any shortness of breath or chest pain.  Past Medical History:  Diagnosis Date   Cough 05/13/2016   Diabetes mellitus    Hemorrhoids 07/10/2010   Hypertension    Kidney stones    Kidney stones    Myalgia and myositis 06/19/2015   Renal disorder     Patient Active Problem List   Diagnosis Date Noted   CAP (community acquired pneumonia) 07/31/2020   No-show for appointment 12/19/2019   COVID-19 05/20/2019   Healthcare maintenance 05/19/2019   Muscle fatigue 05/18/2019   Left upper extremity swelling 12/08/2018   Sore throat 03/13/2017   Vertigo 06/21/2016   Cough 05/13/2016   Abscess of gluteal region 04/19/2016   Panic attack 02/06/2016   Hair loss 12/13/2015   Allergic rhinitis 04/25/2015   Hyperlipidemia 11/03/2014   Irreducible ventral hernia 10/25/2014   Atypical squamous cells of undetermined significance (ASCUS) on Papanicolaou smear of cervix 03/22/2013   At risk for healthcare associated infection 03/15/2013   Vaginal pruritus 01/14/2013   GERD (gastroesophageal reflux disease) 11/13/2012   Glaucoma suspect of both eyes 06/25/2012   OSA (obstructive sleep apnea) 10/18/2010   Diarrhea 02/13/2010   Diabetic peripheral neuropathy ([place]) 01/23/2010   PERSONAL HISTORY OF ALLERGY TO PEANUTS 11/02/2009   PTSD 07/31/2009   Uncontrolled type 2 diabetes mellitus with complication 93/23/5573   HYPERTENSION, BENIGN ESSENTIAL 11/12/2007   DYSLIPIDEMIA 10/02/2007    Bipolar disorder ([place]) 08/11/2007   OBESITY, NOS 03/06/2006    Past Surgical History:  Procedure Laterality Date   CESAREAN SECTION     CHOLECYSTECTOMY     fiborids removed     TUBAL LIGATION       OB History   No obstetric history on file.     Family History  Problem Relation Age of Onset   Hypertension Mother    Diabetes Mother    Cancer Mother     Social History   Tobacco Use   Smoking status: Never   Smokeless tobacco: Never  Vaping Use   Vaping Use: Never used  Substance Use Topics   Alcohol use: No   Drug use: No    Home Medications Prior to Admission medications   Medication Sig Start Date End Date Taking? Authorizing Provider  aspirin 81 MG EC tablet Take 81 mg by mouth daily. Reported on 05/19/2015    [provider]  azithromycin (ZITHROMAX) 250 MG tablet Take 2 tabs on first day and then 1 tablet daily for four days. 07/28/20   Simmons-Robinson, Riki Sheer, MD  benzonatate (TESSALON) 100 MG capsule Take 1 capsule (100 mg total) by mouth every 8 (eight) hours. 07/26/20   Sherrill Raring, PA-C  Blood Glucose Monitoring Suppl (AGAMATRIX PRESTO PRO METER) DEVI 1 Device by Does not apply route once. Patient not taking: Reported on 06/04/2020 06/30/14   Zenia Resides, MD  Blood Glucose Monitoring Suppl (TRUE METRIX METER) w/Device KIT Please use to check  CBGs fasting and 2 hours after largest meal. 03/28/20   Patriciaann Clan, DO  cetirizine (ZYRTEC) 10 MG tablet Take 1 tablet (10 mg total) by mouth daily. Patient not taking: No sig reported 07/13/18   Patriciaann Clan, DO  Dulaglutide (TRULICITY) 3 YI/9.4WN SOPN Inject 3 mg as directed once a week. 06/02/20   Patriciaann Clan, DO  empagliflozin (JARDIANCE) 10 MG TABS tablet Take 1 tablet (10 mg total) by mouth daily. 06/02/20   Patriciaann Clan, DO  EPINEPHrine (EPI-PEN) 0.3 mg/0.3 mL DEVI Inject 0.3 mLs (0.3 mg total) into the muscle as needed. For allergic reaction Patient not taking: No sig reported 08/05/11    Katherina Mires, MD  glucose blood test strip Use as instructed 03/29/20   Darrelyn Hillock N, DO  glucose blood test strip USE AS INSTRUCTED 03/29/20 03/29/21  Patriciaann Clan, DO  insulin aspart (NOVOLOG FLEXPEN) 100 UNIT/ML FlexPen INJECT 20-35 UNITS INTO THE SKIN UP TO 3 TIMES DAILY WITH MEALS. 09/12/20   Paige, Victoria J, DO  insulin aspart (NOVOLOG) 100 UNIT/ML injection Inject 45 Units into the skin 3 (three) times daily before meals. 09/13/20 10/13/20  Shary Key, DO  Insulin Syringe-Needle U-100 (INSULIN SYRINGE .3CC/31GX5/16") 31G X 5/16" 0.3 ML MISC Use 3 needles per day 06/28/13   Elayne Snare, MD  mometasone (NASONEX) 50 MCG/ACT nasal spray SPRAY 2 SPRAYS INTO THE NOSE DAILY. Patient not taking: No sig reported 06/16/19   Patriciaann Clan, DO  PROAIR HFA 108 [id number] Base) MCG/ACT inhaler INHALE 2 PUFFS INTO THE LUNGS EVERY 6 HOURS AS NEEDED FOR WHEEZING. 10/11/20   Ezequiel Essex, MD  TRUEplus Lancets 28G MISC Use to check glucose levels twice daily as directed. 03/29/20   Patriciaann Clan, DO  TRUEplus Lancets 28G MISC USE TO CHECK GLUCOSE LEVELS TWICE DAILY AS DIRECTED. 03/29/20 03/29/21  Patriciaann Clan, DO    Allergies    Metformin and related, Peanut-containing drug products, Codeine, Diphenhydramine hcl, Fexofenadine-pseudoephed er, Latuda [lurasidone hcl], and Shellfish allergy  Review of Systems   Review of Systems  Constitutional:  Positive for fever.  HENT:  Positive for congestion and sore throat.   Respiratory:  Negative for shortness of breath.   Cardiovascular:  Negative for chest pain.  Musculoskeletal:  Positive for myalgias.  Neurological:  Positive for headaches.   Physical Exam Updated Vital Signs BP (!) 131/95 (BP Location: Left Arm)   Pulse (!) 102   Temp 99.3 F (37.4 C) (Oral)   Resp 18   Ht _0  (1.499 m)   Wt 90.3 kg   SpO2 92%   BMI 40.19 kg/m   Physical Exam Vitals and nursing note reviewed. Exam conducted with a chaperone present.   Constitutional:      Appearance: Normal appearance.  HENT:     Head: Normocephalic.     Nose: Congestion present.     Mouth/Throat:     Pharynx: Posterior oropharyngeal erythema present.  Eyes:     Extraocular Movements: Extraocular movements intact.     Pupils: Pupils are equal, round, and reactive to light.  Cardiovascular:     Rate and Rhythm: Normal rate and regular rhythm.  Pulmonary:     Effort: Pulmonary effort is normal.     Breath sounds: Normal breath sounds.     Comments: Lungs CTA bilaterally. No accessory muscle use. Speaking in complete sentences.  Abdominal:     General: Abdomen is flat.     Palpations: Abdomen  is soft.  Musculoskeletal:     Cervical back: Normal range of motion.  Neurological:     Mental Status: She is alert.  Psychiatric:        Mood and Affect: Mood normal.    ED Results / Procedures / Treatments   Labs (all labs ordered are listed, but only abnormal results are displayed) Labs Reviewed  RESP PANEL BY RT-PCR (FLU A&B, COVID) [id number]    EKG None  Radiology No results found.  Procedures Procedures   Medications Ordered in ED Medications - No data to display  ED Course  I have reviewed the triage vital signs and the nursing notes.  Pertinent labs & imaging results that were available during my care of the patient were reviewed by me and considered in my medical decision making (see chart for details).    MDM Rules/Calculators/A&P                           PE and history suspicious for URI. Covid test pending.   No signs of respiratory distress. No hypoxia or tachycardia. Lungs CTA bilaterally. Doubt underlying cardiopulmonary process.  I considered, but think unlikely, dangerous causes of this patient's symptoms to include ACS, CHF or COPD exacerbations, pneumonia, pneumothorax.  Patient is nontoxic appearing and not in need of emergent medical intervention. Patient told to self isolate at home until symptoms subside for  72 hours, and that they will call with the COVID resul   Final Clinical Impression(s) / ED Diagnoses Final diagnoses:  None    Rx / DC Orders ED Discharge Orders     None        Sherrill Raring, PA-C 11/12/20 [place], [place], MD 11/12/20 1431

## 2020-11-22 ENCOUNTER — Other Ambulatory Visit: Payer: Self-pay | Admitting: Family Medicine

## 2020-11-22 ENCOUNTER — Telehealth: Payer: Self-pay

## 2020-11-22 NOTE — Telephone Encounter (Signed)
Patient calls nurse line regarding continued cough. Patient was diagnosed with the flu on 11/6.  Patient reports that she has been using tessalon and OTC cough medications, however, they are not working.   Patient is requesting alternative cough medication. We do not have any appointments until next Tuesday.   Please advise.   Talbot Grumbling, RN

## 2020-11-27 NOTE — Telephone Encounter (Signed)
Delay in documentation.   Attempted to call patient on Friday to provide the below recommendations.   No answer, left VM for patient to return call to office to discuss further.   Talbot Grumbling, RN

## 2020-12-18 ENCOUNTER — Telehealth: Payer: Self-pay

## 2020-12-18 ENCOUNTER — Other Ambulatory Visit: Payer: Self-pay | Admitting: Family Medicine

## 2020-12-18 MED ORDER — FLUCONAZOLE 150 MG PO TABS: 150.0000 mg | ORAL_TABLET | Freq: Every day | ORAL | 0 refills | Status: DC

## 2020-12-18 NOTE — Telephone Encounter (Signed)
Patient calls nurse line reporting a yeast infection. Patient reports thick "itchy" discharge. Patient reports she has a hx of yeast infections contributed by diabetes. Patient is requesting diflucan to her pharmacy.   Will forward to PCP.   This needs to go to [facility] on [address].

## 2020-12-18 NOTE — Progress Notes (Signed)
Sent in [place] per patient's request due to vaginal discharge and pruritis. Hx of diabetes and recurrent yeast infections. If still symptomatic despite treatment recommend coming into the clinic for evaluation

## 2021-01-31 ENCOUNTER — Other Ambulatory Visit: Payer: Self-pay | Admitting: Family Medicine

## 2021-01-31 NOTE — Telephone Encounter (Signed)
Patient calls nurse line requesting diflucan- 2 tablets.   Patient is reporting vaginal itching and irritation. Patient believes this is related to diabetes. Patient requested to schedule with [facility] on 02/05/2021, as PCP schedule did not work with her work schedule.    Please advise.   Talbot Grumbling, RN

## 2021-02-05 ENCOUNTER — Other Ambulatory Visit: Payer: Self-pay

## 2021-02-05 ENCOUNTER — Ambulatory Visit (INDEPENDENT_AMBULATORY_CARE_PROVIDER_SITE_OTHER): Payer: Self-pay | Admitting: Pharmacist

## 2021-02-05 VITALS — Wt 198.6 lb

## 2021-02-05 DIAGNOSIS — E1165 Type 2 diabetes mellitus with hyperglycemia: Secondary | ICD-10-CM

## 2021-02-05 DIAGNOSIS — Z794 Long term (current) use of insulin: Secondary | ICD-10-CM

## 2021-02-05 MED ORDER — TRUEPLUS LANCETS 28G MISC
4 refills | Status: AC
Start: 2021-02-05 — End: 2022-02-05
  Filled 2021-02-05: qty 100, 50d supply, fill #0

## 2021-02-05 MED ORDER — GLUCOSE BLOOD VI STRP
ORAL_STRIP | 12 refills | Status: DC
  Filled 2021-02-05: qty 100, 50d supply, fill #0

## 2021-02-05 MED ORDER — OZEMPIC (1 MG/DOSE) 4 MG/3ML ~~LOC~~ SOPN: 1.0000 mg | PEN_INJECTOR | SUBCUTANEOUS | 0 refills | Status: DC

## 2021-02-05 NOTE — Patient Instructions (Signed)
[name] it was a pleasure seeing you today.   Please do the following:  Switch your Trulicity 1.5mg  once weekly to Ozempic 1mg  once weekly but everything else will stay the same. Continue checking blood sugars at home. It's really important that you record these and bring these in to your next doctor's appointment.  Continue making the lifestyle changes we've discussed together during our visit. Diet and exercise play a significant role in improving your blood sugars.  Please bring all your medications to our next appointment Follow-up with me/PCP in two weeks.    Hypoglycemia or low blood sugar:   Low blood sugar can happen quickly and may become an emergency if not treated right away.   While this shouldn't happen often, it can be brought upon if you skip a meal or do not eat enough. Also, if your insulin or other diabetes medications are dosed too high, this can cause your blood sugar to go to low.   Warning signs of low blood sugar include: Feeling shaky or dizzy Feeling weak or tired  Excessive hunger Feeling anxious or upset  Sweating even when you aren't exercising  What to do if I experience low blood sugar? Follow the Rule of 15 Check your blood sugar with your meter. If lower than 70, proceed to step 2.  Treat with 15 grams of fast acting carbs which is found in 3-4 glucose tablets. If none are available you can try hard candy, 1 tablespoon of sugar or honey,4 ounces of fruit juice, or 6 ounces of REGULAR soda.  Re-check your sugar in 15 minutes. If it is still below 70, do what you did in step 2 again. If your blood sugar has come back up, go ahead and eat a snack or small meal made up of complex carbs (ex. Whole grains) and protein at this time to avoid recurrence of low blood sugar.

## 2021-02-05 NOTE — Assessment & Plan Note (Signed)
T2DM is not controlled likely due to lack of follow-up for further management and adjustment of medications. Medication adherence appears suboptimal. Will switch patient from Trulicity 1.5mg  once weekly to Ozempic 1mg  once weekly for further glycemic and weight control. Will adjust insulin at next follow-up appointment based on glucose readings. Following instruction patient verbalized understanding of treatment plan.    1. Continued basal insulin degludec Tyler Aas) to 90 units once daily. . 2. Continued  rapid insulin aspart (Novolog) 45 units once daily with meal 3. Discontinued GLP-1 dulaglutide (Trulicity) 1.5mg  once weekly and switch to Ozempic 1mg  once weekly 4. Hold SGLT2-I empagliflozin (Jardiance) 10mg  5. Extensively discussed pathophysiology of diabetes, dietary effects on blood sugar control, and recommended lifestyle interventions,  6. Counseled on s/sx of and management of hypoglycemia 7. Will send message to PCP regarding additional diflucan prescription 8. Next A1C anticipated at next follow-up visit.  Follow-up appointment in two weeks to review blood glucose readings.

## 2021-02-05 NOTE — Progress Notes (Signed)
Subjective:    Patient ID: Margaret Larsen, female    DOB: 1970/02/01, 51 y.o.   MRN: [id number]  HPI Patient is a 51 y.o. female who presents for diabetes management. Appointment conducted via telephone due to issues with connectivity. Patient was referred on 03/24/20 and last seen by Primary Care Provider on 09/14/20. Last seen in pharmacy clinic on 06/02/20.  Patient states she presented for ongoing yeast infection. She attributes this to her diabetes and needing further management. She states she took the Diflucan prescription that was sent in a couple days ago but is requesting an additional prescription.  Insurance coverage/medication affordability: self-pay  Current diabetes medications include: insulin degludec Tyler Aas) 90 units once daily, dulaglutide (Trulicity) 1.5mg  once weekly, insulin aspart (Novolog) 45 units once daily Patient states that She is taking her diabetes medications as prescribed but is unable to recall her exact dosing of insulin. Patient denies adherence to medications and will miss doses of insulin about 2x/week.  Does you feel that your medications are working for you?  no  Have you been experiencing any side effects to the medications prescribed? no  Do you have any problems obtaining medications due to transportation or finances?  Yes; self-pay so cost can be a limiting factor and reports never getting     Patient reported dietary habits: Patient reports eating around 1 meal a day and drinking water and usually 1 glass of juice   Patient denies hypoglycemic events. Patient reports polyuria (increased urination).  Patient denies polyphagia (increased appetite).  Patient reports polydipsia (increased thirst).  Patient denies neuropathy (nerve pain). Patient reports visual changes. Patient reports self foot exams.   Reports she has not been consistently checking her blood glucose but when she does she reports it in the high 100's-200's; reports one high  blood glucose in the 400's due to something she ate  Objective:   Vitals:   02/05/21 1538  Weight: 198 lb 9.6 oz (90.1 kg)   Physical Exam Neurological:     Mental Status: She is alert and oriented to person, place, and time.     Review of Systems  Genitourinary:  Positive for urgency.    Labs:   Lab Results  Component Value Date   HGBA1C 11.5 (A) 08/11/2020   HGBA1C 10.9 (A) 04/12/2020   HGBA1C 9.5 (A) 10/28/2019    Lab Results  Component Value Date   MICRALBCREAT 0.5 08/09/2013    Lipid Panel     Component Value Date/Time   CHOL 201 (H) 04/12/2020 0949   TRIG 102 04/12/2020 0949   HDL 45 04/12/2020 0949   CHOLHDL 4.5 (H) 04/12/2020 0949   CHOLHDL 4.6 05/19/2015 0933   VLDL 27 05/19/2015 0933   LDLCALC 138 (H) 04/12/2020 0949    Clinical Atherosclerotic Cardiovascular Disease (ASCVD): No  The 10-year ASCVD risk score (Arnett DK, et al., 2019) is: 4.7%   Values used to calculate the score:     Age: 62 years     Sex: Female     Is Non-Hispanic African American: Yes     Diabetic: Yes     Tobacco smoker: No     Systolic Blood Pressure: 831 mmHg     Is BP treated: No     HDL Cholesterol: 45 mg/dL     Total Cholesterol: 201 mg/dL    Assessment/Plan:   T2DM is not controlled likely due to lack of follow-up for further management and adjustment of medications. Medication adherence appears suboptimal. Will switch  patient from Trulicity 1.5mg  once weekly to Ozempic 1mg  once weekly for further glycemic and weight control. Will adjust insulin at next follow-up appointment based on glucose readings. Following instruction patient verbalized understanding of treatment plan.    Continued basal insulin degludec Tyler Aas) to 90 units once daily. . Continued  rapid insulin aspart (Novolog) 45 units once daily with meal Discontinued GLP-1 dulaglutide (Trulicity) 1.5mg  once weekly and switch to Ozempic 1mg  once weekly Hold  SGLT2-I empagliflozin (Jardiance)  10mg  Extensively discussed pathophysiology of diabetes, dietary effects on blood sugar control, and recommended lifestyle interventions,  Counseled on s/sx of and management of hypoglycemia Will send message to PCP regarding additional diflucan prescription Next A1C anticipated at next follow-up visit.  Follow-up appointment in two weeks to review blood glucose readings. Written patient instructions provided.  This appointment required 30 minutes of direct patient care.  Thank you for involving pharmacy to assist in providing this patient's care.

## 2021-02-06 ENCOUNTER — Other Ambulatory Visit: Payer: Self-pay | Admitting: Family Medicine

## 2021-02-06 MED ORDER — FLUCONAZOLE 150 MG PO TABS: ORAL_TABLET | ORAL | 0 refills | Status: DC

## 2021-02-08 MED ORDER — TRESIBA FLEXTOUCH 200 UNIT/ML ~~LOC~~ SOPN: 80.0000 [IU] | PEN_INJECTOR | Freq: Every day | SUBCUTANEOUS | 0 refills | Status: DC

## 2021-02-08 MED ORDER — NOVOLOG FLEXPEN 100 UNIT/ML ~~LOC~~ SOPN: 45.0000 [IU] | PEN_INJECTOR | Freq: Two times a day (BID) | SUBCUTANEOUS | 0 refills | Status: DC

## 2021-02-08 NOTE — Progress Notes (Signed)
Spoke with Margaret Larsen at [facility] per their voicemail that patient had not picked up any medications since September. Discussed with patient and she stated she was very non-adherent to her medications for many months but is trying to get back on track. She confirmed she was taking 45 units of Novolog one to two times daily depending on how many meals she eats and 80 units of Tresiba once daily. She has also been taking her Trulicity again.  Will re-send prescriptions for Tresiba and Novolog to pharmacy. Patient is switching to Ozempic instead of Trulicity.

## 2021-02-08 NOTE — Addendum Note (Signed)
Addended by: Hughes Better on: 02/08/2021 11:07 AM   Modules accepted: Orders

## 2021-02-12 ENCOUNTER — Other Ambulatory Visit: Payer: Self-pay

## 2021-02-19 ENCOUNTER — Other Ambulatory Visit: Payer: Self-pay

## 2021-02-19 ENCOUNTER — Ambulatory Visit (INDEPENDENT_AMBULATORY_CARE_PROVIDER_SITE_OTHER): Payer: Self-pay | Admitting: Pharmacist

## 2021-02-19 DIAGNOSIS — E119 Type 2 diabetes mellitus without complications: Secondary | ICD-10-CM

## 2021-02-19 MED ORDER — FLUCONAZOLE 150 MG PO TABS
ORAL_TABLET | ORAL | 0 refills | Status: DC
Start: 2021-02-19 — End: 2021-05-09

## 2021-02-19 NOTE — Assessment & Plan Note (Signed)
T2DM is not controlled likely due to lack of follow-up for further management and adjustment of medications. Will continue to titrate up patient's Ozempic to max dose of 2mg  when appropriate at next office visit. In the meantime patient is taking more basal insulin than bolus per day. For the time being will add on 10 units of Novolog with evening snack. Will add on SGLT2 when patient's blood glucose readings show improvement. Following instruction patient verbalized understanding of treatment plan.    1. Continued basal insulin degludec Tyler Aas) to 90 units once daily. . 2. Continued  rapid insulin aspart (Novolog) 45 units once daily with meal and START 10 units once daily with evening snack 3. Continued GLP-1 Ozempic 1mg  once weekly 4. Continue to hold SGLT2-I empagliflozin (Jardiance) 10mg  5. Extensively discussed pathophysiology of diabetes, dietary effects on blood sugar control, and recommended lifestyle interventions,  6. Counseled on s/sx of and management of hypoglycemia 7. Re-sent Diflucan to [place] 8. Next A1C anticipated at next follow-up visit.

## 2021-02-19 NOTE — Patient Instructions (Addendum)
Margaret Larsen it was a pleasure seeing you today.   Please do the following:  Please take 10 units of Novolog with your evening snack in addition to your current medications as directed today during your appointment. If you have any questions or if you believe something has occurred because of this change, call me or your doctor to let one of [place] know.  Continue checking blood sugars at home. It's really important that you record these and bring these in to your next doctor's appointment.  Continue making the lifestyle changes we've discussed together during our visit. Diet and exercise play a significant role in improving your blood sugars.  Follow-up with Dr. Valentina Lucks on 3/3 at 3:30 PM   Hypoglycemia or low blood sugar:   Low blood sugar can happen quickly and may become an emergency if not treated right away.   While this shouldn't happen often, it can be brought upon if you skip a meal or do not eat enough. Also, if your insulin or other diabetes medications are dosed too high, this can cause your blood sugar to go to low.   Warning signs of low blood sugar include: Feeling shaky or dizzy Feeling weak or tired  Excessive hunger Feeling anxious or upset  Sweating even when you aren't exercising  What to do if I experience low blood sugar? Follow the Rule of 15 Check your blood sugar with your meter. If lower than 70, proceed to step 2.  Treat with 15 grams of fast acting carbs which is found in 3-4 glucose tablets. If none are available you can try hard candy, 1 tablespoon of sugar or honey,4 ounces of fruit juice, or 6 ounces of REGULAR soda.  Re-check your sugar in 15 minutes. If it is still below 70, do what you did in step 2 again. If your blood sugar has come back up, go ahead and eat a snack or small meal made up of complex carbs (ex. Whole grains) and protein at this time to avoid recurrence of low blood sugar.

## 2021-02-19 NOTE — Progress Notes (Signed)
Subjective:    Patient ID: Margaret Larsen, female    DOB: 05/04/1970, 51 y.o.   MRN: [id number]  HPI Patient is a 51 y.o. female who presents for diabetes management. Appointment conducted via telephone due to issues with connectivity. Patient was referred on 03/24/20 and last seen by Primary Care Provider on 09/14/20. Last seen in pharmacy clinic on 02/05/21.  Patient states she has been adherent to the medications since our last office visit. She did state she was unable to obtain the Diflucan as it was sent to the wrong pharmacy and she continues to have symptoms of a yeast infection.   Insurance coverage/medication affordability: self-pay  Current diabetes medications include: insulin degludec Tyler Aas) 90 units once daily, Ozempic 1mg  once weekly on Mondays (completed 2 doses), insulin aspart (Novolog) 45 units once daily Patient states that She is taking her diabetes medications as prescribed. Patient reports improved adherence to medications  Does you feel that your medications are working for you?  no  Have you been experiencing any side effects to the medications prescribed? Yes; slight diarrhea since initiating Ozempic  Do you have any problems obtaining medications due to transportation or finances?  Yes; self-pay so cost can be a limiting factor and reports never getting     Patient reported dietary habits: Breakfast: skips Lunch: chicken wings + V8 splash Dinner: usually skips 4-5 days of the week Snacks: crackers, peanut butter wafers Drinks: water  Patient denies hypoglycemic events. Patient reports polyuria (increased urination).  Patient denies polyphagia (increased appetite).  Patient reports polydipsia (increased thirst).  Patient denies neuropathy (nerve pain). Patient reports visual changes. Patient reports self foot exams.   Objective:  Fasting blood glucose: 373, 325, 126  Physical Exam Neurological:     Mental Status: She is alert and oriented to  person, place, and time.    Review of Systems  Gastrointestinal:  Positive for diarrhea. Negative for nausea and vomiting.  Genitourinary:  Positive for urgency.    Labs:   Lab Results  Component Value Date   HGBA1C 11.5 (A) 08/11/2020   HGBA1C 10.9 (A) 04/12/2020   HGBA1C 9.5 (A) 10/28/2019    Lab Results  Component Value Date   MICRALBCREAT 0.5 08/09/2013    Lipid Panel     Component Value Date/Time   CHOL 201 (H) 04/12/2020 0949   TRIG 102 04/12/2020 0949   HDL 45 04/12/2020 0949   CHOLHDL 4.5 (H) 04/12/2020 0949   CHOLHDL 4.6 05/19/2015 0933   VLDL 27 05/19/2015 0933   LDLCALC 138 (H) 04/12/2020 0949    Clinical Atherosclerotic Cardiovascular Disease (ASCVD): No  The 10-year ASCVD risk score (Arnett DK, et al., 2019) is: 4.7%   Values used to calculate the score:     Age: 74 years     Sex: Female     Is Non-Hispanic African American: Yes     Diabetic: Yes     Tobacco smoker: No     Systolic Blood Pressure: 765 mmHg     Is BP treated: No     HDL Cholesterol: 45 mg/dL     Total Cholesterol: 201 mg/dL    Assessment/Plan:   T2DM is not controlled likely due to lack of follow-up for further management and adjustment of medications. Will continue to titrate up patient's Ozempic to max dose of 2mg  when appropriate at next office visit. In the meantime patient is taking more basal insulin than bolus per day. For the time being will add on 10  units of Novolog with evening snack. Will add on SGLT2 when patient's blood glucose readings show improvement. Following instruction patient verbalized understanding of treatment plan.    Continued basal insulin degludec Tyler Aas) to 90 units once daily. . Continued  rapid insulin aspart (Novolog) 45 units once daily with meal and START 10 units once daily with evening snack Continued GLP-1 Ozempic 1mg  once weekly Continue to hold  SGLT2-I empagliflozin (Jardiance) 10mg  Extensively discussed pathophysiology of diabetes, dietary  effects on blood sugar control, and recommended lifestyle interventions,  Counseled on s/sx of and management of hypoglycemia Re-sent Diflucan to [place] Next A1C anticipated at next follow-up visit.  Follow-up appointment in three weeks to review blood glucose readings. Written patient instructions provided.  This appointment required 30 minutes of direct patient care.  Thank you for involving pharmacy to assist in providing this patient's care.

## 2021-02-19 NOTE — Progress Notes (Signed)
Addended to state patient is taking Tresiba 80 units NOT 90 units once daily

## 2021-03-05 ENCOUNTER — Other Ambulatory Visit: Payer: Self-pay | Admitting: Family Medicine

## 2021-03-05 DIAGNOSIS — J302 Other seasonal allergic rhinitis: Secondary | ICD-10-CM

## 2021-03-08 ENCOUNTER — Ambulatory Visit: Payer: Self-pay | Admitting: Pharmacist

## 2021-03-15 ENCOUNTER — Ambulatory Visit: Payer: Self-pay | Admitting: Pharmacist

## 2021-04-04 ENCOUNTER — Other Ambulatory Visit: Payer: Self-pay | Admitting: Family Medicine

## 2021-04-05 ENCOUNTER — Ambulatory Visit (INDEPENDENT_AMBULATORY_CARE_PROVIDER_SITE_OTHER): Payer: Self-pay | Admitting: Pharmacist

## 2021-04-05 ENCOUNTER — Encounter: Payer: Self-pay | Admitting: Pharmacist

## 2021-04-05 DIAGNOSIS — E119 Type 2 diabetes mellitus without complications: Secondary | ICD-10-CM

## 2021-04-05 MED ORDER — OZEMPIC (2 MG/DOSE) 8 MG/3ML ~~LOC~~ SOPN: 2.0000 mg | PEN_INJECTOR | SUBCUTANEOUS | 11 refills | Status: DC

## 2021-04-05 NOTE — Progress Notes (Signed)
? ? ?S:    ? ?Chief Complaint  ?Patient presents with  ? Medication Management  ?  DM f/u  ? ?Margaret Larsen is a 51 y.o. female who presents for diabetes evaluation, education, and management. PMH is significant for diabetes. Patient was last seen by Dr. Hughes Better, PharmD, on 02/19/21.  ? ?Today, patient arrives in good spirits and presents without assistance.  ? ?Current diabetes medications include: Novolog (insulin aspart) 45 units BID only if eating a meal, Tresiba (insulin degludec) 75 units daily, Ozempic (semaglutide) 1 mg weekly on Mondays ? ?Patient reports taking all medications as prescribed. Patient reports adherence with medications. Patient reports missing her medications 0 times per week, on average. She and a coworker made a deal to work on taking their medications every day which she has been consistent with now over the past month.  ? ?Have you been experiencing any side effects to the medications prescribed? Yes - patient reports headache with Ozempic that has improved over time and is open to increasing the dose today.  ? ?Patient denies hypoglycemic events. ? ?Reported home fasting blood sugars: Patient reports seeing one 347 BG at home and mostly in the mid 200s (>250). Patient denies seeing any 100s.   ? ?Patient reports nocturia (nighttime urination) ~2x per night.  ?Patient  neuropathy (nerve pain) which could also be confounded by standing all day at work. ?Patient denies visual changes. ?Patient reports self foot exams.  ? ?Patient reported dietary habits: Eats 1 meals/day ?Breakfast: only sometimes; hashbrown ?Lunch: cheetos, chicken sandwich ?Dinner: pork chops, potatoes ?Snacks: peanut butter crackers, candy bar ?Drinks: cutting back on juice. Snapple juice, water ? ?Patient reports not having much of an appetite.  ? ?Patient-reported exercise habits: walking all day at work. Patient works at [employer]. ? ? ?O:  ?Physical Exam ?Constitutional:   ?   Appearance: Normal  appearance.  ?Pulmonary:  ?   Effort: Pulmonary effort is normal.  ?Neurological:  ?   Mental Status: She is alert.  ?Psychiatric:     ?   Mood and Affect: Mood normal.     ?   Behavior: Behavior normal.     ?   Thought Content: Thought content normal.     ?   Judgment: Judgment normal.  ? ? ?Review of Systems  ?All other systems reviewed and are negative. ? ? ?Lab Results  ?Component Value Date  ? HGBA1C 11.5 (A) 08/11/2020  ? ?There were no vitals filed for this visit. ? ?Lipid Panel  ?   ?Component Value Date/Time  ? CHOL 201 (H) 04/12/2020 0949  ? TRIG 102 04/12/2020 0949  ? HDL 45 04/12/2020 0949  ? CHOLHDL 4.5 (H) 04/12/2020 0949  ? CHOLHDL 4.6 05/19/2015 0933  ? VLDL 27 05/19/2015 0933  ? [place] 138 (H) 04/12/2020 0949  ? ? ?Clinical Atherosclerotic Cardiovascular Disease (ASCVD): No  ?The 10-year ASCVD risk score (Arnett DK, et al., 2019) is: 4.7% ?  Values used to calculate the score: ?    Age: 73 years ?    Sex: Female ?    Is Non-Hispanic African American: Yes ?    Diabetic: Yes ?    Tobacco smoker: No ?    Systolic Blood Pressure: 811 mmHg ?    Is BP treated: No ?    HDL Cholesterol: 45 mg/dL ?    Total Cholesterol: 201 mg/dL  ? ?A/P: ?Diabetes longstanding currently uncontrolled. Patient is able to verbalize appropriate hypoglycemia management plan.  Medication adherence appears optimal. Control is suboptimal due to dietary indiscretion and non-optimized medication regimen. ?-Continued basal insulin Tresiba (insulin degludec) at 75 units daily.  ?-Continued rapid insulin Novolog (insulin aspart) to 45 units BID only if taking a meal.  ?-Increased dose of GLP-1 Ozempic (generic name semaglutide) to 2 mg weekly.  ?- Continued to hold  SGLT2-I Jardiance (generic name empagliflozin) 10 mg daily. ?-Patient educated on purpose, proper use, and potential adverse effects of GI upset.  ?-Extensively discussed pathophysiology of diabetes, recommended lifestyle interventions, dietary effects on blood sugar  control.  ?-Counseled on s/sx of and management of hypoglycemia.  ?-Next A1c anticipated at next visit.  ? ?Written patient instructions provided. Patient verbalized understanding of treatment plan. Total time in face to face counseling 27 minutes.   ? ?Follow up pharmacist in 6 weeks. PCP clinic visit in 4 weeks on 4/24 for wellness visit. Patient seen with Earvin Hansen PharmD Candidate and Rebbeca Paul, PharmD - PGY2 Pharmacy Resident.  ?

## 2021-04-05 NOTE — Assessment & Plan Note (Signed)
Diabetes longstanding currently uncontrolled. Patient is able to verbalize appropriate hypoglycemia management plan. Medication adherence appears optimal. Control is suboptimal due to dietary indiscretion and non-optimized medication regimen. ?-Continued basal insulin Tresiba (insulin degludec) at 75 units daily.  ?-Continued rapid insulin Novolog (insulin aspart) to 45 units BID only if taking a meal.  ?-Increased dose of GLP-1 Ozempic (generic name semaglutide) to 2 mg weekly.  ?-Continued to hold SGLT2-I Jardiance (generic name empagliflozin) 10 mg daily. ?-Patient educated on purpose, proper use, and potential adverse effects of GI upset.  ?-Extensively discussed pathophysiology of diabetes, recommended lifestyle interventions, dietary effects on blood sugar control.  ?-Counseled on s/sx of and management of hypoglycemia.  ?-Next A1c anticipated at next visit.  ?

## 2021-04-05 NOTE — Patient Instructions (Addendum)
It was nice to see you today! ? ?We are increasing your Ozempic (semaglutide) dose to 2 mg weekly. When you receive the 2 mg dose from [facility], begin doubling up on your dose with the 1 mg pen with 2 injections for a total of 2 mg until you run out of these pens and then begin using the 2 mg pen.  ?Continue all other medications. ? ?We will follow up with you in 6 weeks. ?

## 2021-04-30 ENCOUNTER — Ambulatory Visit: Payer: Self-pay | Admitting: Family Medicine

## 2021-05-09 ENCOUNTER — Other Ambulatory Visit: Payer: Self-pay

## 2021-05-09 ENCOUNTER — Ambulatory Visit (INDEPENDENT_AMBULATORY_CARE_PROVIDER_SITE_OTHER): Payer: Self-pay | Admitting: Family Medicine

## 2021-05-09 ENCOUNTER — Encounter: Payer: Self-pay | Admitting: Family Medicine

## 2021-05-09 VITALS — BP 164/90 | HR 91 | Wt 197.0 lb

## 2021-05-09 DIAGNOSIS — E119 Type 2 diabetes mellitus without complications: Secondary | ICD-10-CM

## 2021-05-09 DIAGNOSIS — Z794 Long term (current) use of insulin: Secondary | ICD-10-CM

## 2021-05-09 LAB — POCT GLYCOSYLATED HEMOGLOBIN (HGB A1C): HbA1c, POC (controlled diabetic range): 11.4 % — AB (ref 0.0–7.0)

## 2021-05-09 MED ORDER — FLUCONAZOLE 150 MG PO TABS: ORAL_TABLET | ORAL | 1 refills | Status: DC

## 2021-05-09 NOTE — Progress Notes (Signed)
? ? ?  SUBJECTIVE:  ? ?CHIEF COMPLAINT / HPI:  ? ?Margaret Larsen is a 51 yo who presents for diabetes follow up. She is currently being followed by pharmacy team for assistance with her medication management. Last seen on 3/30 and was continued on basal insulin Tresiba at 75 units daily, Novolog (insulin aspart) to 45 units BID only if taking a meal. Ozempic was increased to '2mg'$  weekly ? ?Patient states she takes 75 in the morning and 45 either at lunch or at night. States she takes short acting once a day with meals because she only eats once a day. State she is not hungry but when she snacks she'll eat a candy bar, cuts down on juice. Drinks a lot of water  ? ?States she wants a ntoe for work- having back and arm pain for the past few days thinks she may have pulled it when she was helping someone with a cart. Wants another note printed reminding job about neuropathy  ? ?Requesting Diflucan, experiencing yeast infections that she gets fairly often  ? ?Health maintenance: Due for Pap smear, mammogram, hep C screen, ophtho exam, foot exam, Shingrix. Saw an eye doctor a year ago states it is too expensive  ? ? ?OBJECTIVE:  ? ?BP (!) 164/90   Pulse 91   Wt 197 lb (89.4 kg)   SpO2 98%   BMI 39.79 kg/m?   ? ?Physical exam ?General: well appearing, NAD ?Cardiovascular: RRR, no murmurs ?Lungs: CTAB. Normal WOB ?Abdomen: soft, non-distended, non-tender ?Skin: warm, dry. No edema. Feed without erythema or wounds  ? ?ASSESSMENT/PLAN:  ? ?Type 2 diabetes mellitus with diabetic neuropathy, unspecified ([place]) ?A1c today 11.4 similar to what it was 9 months ago. Patient reports taking [place] 75u daily and Novolog 45 u daily since she only eats 1 meal. Performed diabetic foot exam without any loss of sensation or wounds. Discussed in detail that a1c is far from goal and stressed importance of getting better control and discussed adverse outcomes that could arise. Reiterated pharmacy's recommendations of continuing basal insulin  Tresiba (insulin degludec) at 75 units daily, rapid insulin Novolog (insulin aspart) 45 units BID only if taking a meal.  ?Continue Ozempic 2 mg weekly. Discussed importance of nutrition increasing vegetables and reducing junk foods. Advised to schedule appointment with ophthalmology. Will check a1c in 3 months. Continue following with pharmacy team as well.  ?  ?DM2 ?A1c today 11.4 similar to what it was 9 months ago. Patient reports taking [place] 75u daily and Novolog 45 u daily since she only eats 1 meal. Performed diabetic foot exam without any loss of sensation or wounds. Discussed in detail that a1c is far from goal and stressed importance of getting better control and discussed adverse outcomes that could arise. Reiterated pharmacy's recommendations of continuing basal insulin Tresiba (insulin degludec) at 75 units daily, rapid insulin Novolog (insulin aspart) 45 units BID only if taking a meal.  ?Continue Ozempic 2 mg weekly. Discussed importance of nutrition increasing vegetables and reducing junk foods. Advised to schedule appointment with ophthalmology  ? ?Recommended patient return in the next couple of weeks for physical and pap smear  ? ?Shary Key, DO ?[place]  ? ?

## 2021-05-09 NOTE — Patient Instructions (Signed)
It was great seeing you today! ? ?Your a1c was 11.4 so as we discussed it would be very important to continue watching the things you eat reducing sugar intake, carb intake, eating meals with vegetables and taking your insulin as prescribed.  ? ?I have sent in the Diflucan to your pharmacy. ? ?I have given you the form for your mammogram please call to get that scheduled ? ?Please check-out at the front desk before leaving the clinic. I'd like to see you back in the next few weeks to do your full physical, but if you need to be seen earlier than that for any new issues we're happy to fit you in, just give [place] a call! ? ?Visit Reminders: ?- Stop by the pharmacy to pick up your prescriptions  ?- Continue to work on your healthy eating habits and incorporating exercise into your daily life.  ? ?Feel free to call with any questions or concerns at any time, at [phone]. ?  ?Take care,  ?Dr. Shary Key ?[place] ?

## 2021-05-10 ENCOUNTER — Other Ambulatory Visit: Payer: Self-pay | Admitting: Family Medicine

## 2021-05-13 DIAGNOSIS — E1140 Type 2 diabetes mellitus with diabetic neuropathy, unspecified: Secondary | ICD-10-CM | POA: Insufficient documentation

## 2021-05-13 NOTE — Assessment & Plan Note (Addendum)
A1c today 11.4 similar to what it was 9 months ago. Patient reports taking [place] 75u daily and Novolog 45 u daily since she only eats 1 meal. Performed diabetic foot exam without any loss of sensation or wounds. Discussed in detail that a1c is far from goal and stressed importance of getting better control and discussed adverse outcomes that could arise. Reiterated pharmacy's recommendations of continuing basal insulin Tresiba (insulin degludec) at 75 units daily, rapid insulin Novolog (insulin aspart) 45 units BID only if taking a meal.  ?Continue Ozempic 2 mg weekly. Discussed importance of nutrition increasing vegetables and reducing junk foods. Advised to schedule appointment with ophthalmology. Will check a1c in 3 months. Continue following with pharmacy team as well.  ?

## 2021-05-17 ENCOUNTER — Ambulatory Visit: Payer: Self-pay | Admitting: Pharmacist

## 2021-06-11 ENCOUNTER — Other Ambulatory Visit: Payer: Self-pay | Admitting: Family Medicine

## 2021-06-12 ENCOUNTER — Ambulatory Visit: Payer: Self-pay | Admitting: Family Medicine

## 2021-06-12 ENCOUNTER — Encounter: Payer: Self-pay | Admitting: *Deleted

## 2021-06-19 ENCOUNTER — Other Ambulatory Visit: Payer: Self-pay

## 2021-06-20 MED ORDER — FLUCONAZOLE 150 MG PO TABS: ORAL_TABLET | ORAL | 1 refills | Status: DC

## 2021-07-19 ENCOUNTER — Other Ambulatory Visit: Payer: Self-pay | Admitting: Family Medicine

## 2021-07-30 ENCOUNTER — Telehealth: Payer: Self-pay

## 2021-07-30 NOTE — Telephone Encounter (Signed)
Patient calls nurse line reporting dizziness and nausea.   Patient reports symptoms started ~1 week ago and she was hopeful they would "go away." Patient reports her cbgs run anywhere between 150s-300s. Patient unsure of current cbg.   Patient reports LE numbness and numbness in her hands. Patients does endorse SOB, however denies headaches or vision changes at this time.   Patient advised to be evaluated today. Apt offered for this afternoon, however she does not get off until later this afternoon.  Patient advised to go to an UC.   Patient agreed with plan.

## 2021-08-20 ENCOUNTER — Other Ambulatory Visit: Payer: Self-pay | Admitting: Family Medicine

## 2021-09-26 ENCOUNTER — Other Ambulatory Visit: Payer: Self-pay | Admitting: Family Medicine

## 2021-11-05 ENCOUNTER — Other Ambulatory Visit: Payer: Self-pay | Admitting: Family Medicine

## 2021-12-05 ENCOUNTER — Other Ambulatory Visit: Payer: Self-pay | Admitting: Family Medicine

## 2021-12-08 ENCOUNTER — Emergency Department (HOSPITAL_COMMUNITY)
Admission: EM | Admit: 2021-12-08 | Discharge: 2021-12-08 | Disposition: A | Discharge status: Home / Self Care | Payer: Medicaid Other | Attending: Emergency Medicine | Admitting: Emergency Medicine

## 2021-12-08 ENCOUNTER — Encounter (HOSPITAL_COMMUNITY): Payer: Self-pay

## 2021-12-08 ENCOUNTER — Emergency Department (HOSPITAL_COMMUNITY): Payer: Medicaid Other

## 2021-12-08 ENCOUNTER — Other Ambulatory Visit: Payer: Self-pay

## 2021-12-08 DIAGNOSIS — Z1152 Encounter for screening for COVID-19: Secondary | ICD-10-CM | POA: Insufficient documentation

## 2021-12-08 DIAGNOSIS — Z7984 Long term (current) use of oral hypoglycemic drugs: Secondary | ICD-10-CM | POA: Insufficient documentation

## 2021-12-08 DIAGNOSIS — Z7982 Long term (current) use of aspirin: Secondary | ICD-10-CM | POA: Diagnosis not present

## 2021-12-08 DIAGNOSIS — Z794 Long term (current) use of insulin: Secondary | ICD-10-CM | POA: Diagnosis not present

## 2021-12-08 DIAGNOSIS — E119 Type 2 diabetes mellitus without complications: Secondary | ICD-10-CM | POA: Diagnosis not present

## 2021-12-08 DIAGNOSIS — J069 Acute upper respiratory infection, unspecified: Secondary | ICD-10-CM | POA: Insufficient documentation

## 2021-12-08 DIAGNOSIS — Z91010 Allergy to peanuts: Secondary | ICD-10-CM | POA: Diagnosis not present

## 2021-12-08 DIAGNOSIS — R059 Cough, unspecified: Secondary | ICD-10-CM | POA: Diagnosis present

## 2021-12-08 LAB — RESP PANEL BY RT-PCR (FLU A&B, COVID) ARPGX2
Influenza A by PCR: NEGATIVE
Influenza B by PCR: NEGATIVE
SARS Coronavirus 2 by RT PCR: NEGATIVE

## 2021-12-08 NOTE — ED Triage Notes (Signed)
Productive cough with green sputum, body aches runny nose, and sore throat x 3 days.

## 2021-12-08 NOTE — Discharge Instructions (Signed)
Thank you for allowing me to be part of your care today.  You were seen in the ER for upper respiratory symptoms.  You are negative for COVID and influenza.  Your chest x-ray shows no evidence of pneumonia.    Continue to use supportive care at home including Mucinex during the day for productive cough and Delsym at night to suppress cough to allow you to sleep.  You can use '800mg'$  ibuprofen (Advil) every 8 hours as needed for body aches.   I recommend following up with your primary care provider if your symptoms persist and do not improve.   Return to the ER if you develop new or worsening symptoms or have any new concerns.

## 2021-12-08 NOTE — ED Provider Notes (Signed)
[facility] DEPT Provider Note   CSN: [id number] Arrival date & time: 12/08/21  7017     History  Chief Complaint  Patient presents with   Cough    Margaret Larsen is a 51 y.o. female presents to the ER with complaint of productive cough with green sputum, body aches, runny nose, and sore throat for the past 3 days.  She states she has been using OTC cough and cold medicines with little relief.  Patient reports one episode of fever, but cannot remember how high it was.  Denies otalgia, chest pain, shortness of breath, weakness, syncope.        Home Medications Prior to Admission medications   Medication Sig Start Date End Date Taking? Authorizing Provider  aspirin 81 MG EC tablet Take 81 mg by mouth 3 (three) times a week. Reported on 05/19/2015    [provider]  Blood Glucose Monitoring Suppl (AGAMATRIX PRESTO PRO METER) DEVI 1 Device by Does not apply route once. Patient not taking: Reported on 06/04/2020 06/30/14   Zenia Resides, MD  Blood Glucose Monitoring Suppl (TRUE METRIX METER) w/Device KIT Please use to check CBGs fasting and 2 hours after largest meal. 03/28/20   Patriciaann Clan, DO  cetirizine (ZYRTEC) 10 MG tablet Take 1 tablet (10 mg total) by mouth daily. Patient taking differently: Take 10 mg by mouth daily as needed. 07/13/18   Patriciaann Clan, DO  empagliflozin (JARDIANCE) 10 MG TABS tablet Take 1 tablet (10 mg total) by mouth daily. Patient not taking: Reported on 02/05/2021 06/02/20   Patriciaann Clan, DO  EPINEPHrine (EPI-PEN) 0.3 mg/0.3 mL DEVI Inject 0.3 mLs (0.3 mg total) into the muscle as needed. For allergic reaction Patient not taking: Reported on 11/02/2015 08/05/11   Katherina Mires, MD  fluconazole (DIFLUCAN) 150 MG tablet TAKE 1 TABLET(150 MG) BY MOUTH DAILY 06/20/21   Sonia Side J, DO  glucose blood test strip Use as instructed to check blood glucose twice daily 02/05/21   Zenia Resides, MD  Insulin  Syringe-Needle U-100 (INSULIN SYRINGE .3CC/31GX5/16") 31G X 5/16" 0.3 ML MISC Use 3 needles per day 06/28/13   Elayne Snare, MD  mometasone (NASONEX) 50 MCG/ACT nasal spray SPRAY 2 SPRAYS INTO THE NOSE DAILY. Patient taking differently: SPRAY 2 SPRAYS INTO THE NOSE AS NEEDED. 06/16/19   Beard, Samantha N, DO  NOVOLOG FLEXPEN 100 UNIT/ML FlexPen INJECT 45 UNITS INTO THE SKIN 2 TIMES A DAY WITH A MEAL. **ONLY TAKE IF YOU EAT A MEAL** 12/06/21   Paige, Victoria J, DO  PROAIR HFA 108 [phone] Base) MCG/ACT inhaler INHALE 2 PUFFS INTO THE LUNGS EVERY 6 HOURS AS NEEDED FOR WHEEZING. 03/05/21   Zola Button, MD  Semaglutide, 2 MG/DOSE, (OZEMPIC, 2 MG/DOSE,) 8 MG/3ML SOPN Inject 2 mg into the skin once a week. 04/05/21   Zenia Resides, MD  TRESIBA FLEXTOUCH 200 UNIT/ML FlexTouch Pen INJECT 76 UNITS INTO THE SKIN DAILY. 12/06/21   Shary Key, DO  TRUEplus Lancets 28G MISC USE TO CHECK GLUCOSE LEVELS TWICE DAILY AS DIRECTED. 02/05/21 02/05/22  Zenia Resides, MD      Allergies    Metformin and related, Peanut-containing drug products, Codeine, Diphenhydramine hcl, Fexofenadine-pseudoephed er, Latuda [lurasidone hcl], and Shellfish allergy    Review of Systems   Review of Systems  Constitutional:  Positive for fever.  HENT:  Positive for congestion, rhinorrhea and sore throat. Negative for ear pain.   Respiratory:  Positive  for cough. Negative for shortness of breath.   Cardiovascular:  Negative for chest pain.  Musculoskeletal:  Positive for myalgias.  Neurological:  Negative for syncope and weakness.    Physical Exam Updated Vital Signs BP (!) 177/100   Pulse 99   Temp 98.3 F (36.8 C)   Resp 18   Ht 4' 11" (1.499 m)   Wt 90.7 kg   SpO2 95%   BMI 40.40 kg/m  Physical Exam Vitals and nursing note reviewed.  Constitutional:      General: She is not in acute distress.    Appearance: Normal appearance. She is not ill-appearing or diaphoretic.  HENT:     Right Ear: Tympanic membrane and  ear canal normal.     Left Ear: Tympanic membrane and ear canal normal.     Nose: Congestion and rhinorrhea present. Rhinorrhea is clear.     Mouth/Throat:     Mouth: Mucous membranes are moist.     Pharynx: Oropharynx is clear. Uvula midline. Posterior oropharyngeal erythema present. No pharyngeal swelling or oropharyngeal exudate.     Comments: Post nasal drip appreciated on exam Cardiovascular:     Rate and Rhythm: Normal rate and regular rhythm.     Pulses: Normal pulses.     Heart sounds: Normal heart sounds.  Pulmonary:     Effort: Pulmonary effort is normal. No tachypnea, accessory muscle usage or respiratory distress.     Breath sounds: Normal breath sounds and air entry. No decreased breath sounds, wheezing or rhonchi.  Neurological:     Mental Status: She is alert. Mental status is at baseline.  Psychiatric:        Mood and Affect: Mood normal.        Behavior: Behavior normal.     ED Results / Procedures / Treatments   Labs (all labs ordered are listed, but only abnormal results are displayed) Labs Reviewed  RESP PANEL BY RT-PCR (FLU A&B, COVID) [id number]    EKG None  Radiology DG Chest 2 View  Result Date: 12/08/2021 CLINICAL DATA:  51 year old female with history of productive cough and chest pain. EXAM: CHEST - 2 VIEW COMPARISON:  Chest x-ray 05/25/2019. FINDINGS: Lung volumes are normal. No consolidative airspace disease. No pleural effusions. No pneumothorax. No pulmonary nodule or mass noted. Pulmonary vasculature and the cardiomediastinal silhouette are within normal limits. Surgical clips project over the right upper quadrant of the abdomen, likely from prior cholecystectomy. IMPRESSION: No radiographic evidence of acute cardiopulmonary disease. Electronically Signed   By: Vinnie Langton M.D.   On: 12/08/2021 07:01    Procedures Procedures    Medications Ordered in ED Medications - No data to display  ED Course/ Medical Decision Making/ A&P                            Medical Decision Making Amount and/or Complexity of Data Reviewed Radiology: ordered.   This patient presents to the ED with chief complaint(s) of URI symptoms for 3 days with pertinent past medical history of type 2 diabetes, CAP .The complaint involves an extensive differential diagnosis and also carries with it a high risk of complications and morbidity.    The differential diagnosis includes COVID, influenza, pneumonia, acute bronchitis, viral URI   The initial plan is to obtain respiratory swab for COVID and influenza, obtain CXR to r/o pneumonia  Initial Assessment:   On exam, patient is sitting comfortably in triage, appears ill but non-toxic,  and is in no acute distress.  No evidence of respiratory distress.  Lungs clear to auscultation bilaterally, no wheezing or rhonchi appreciated on exam.  Posterior oropharynx is erythematous with postnasal drip, no exudate.  Bilateral TMs normal, no evidence of acute otitis.  Patient able to swallow and speak without difficulty.  She has congestion with clear rhinorrhea.   Independent ECG/labs interpretation:  The following labs were independently interpreted:  COVID negative, Influenza A and B negative  Independent visualization and interpretation of imaging: I independently visualized the following imaging with scope of interpretation limited to determining acute life threatening conditions related to emergency care: CXR, which revealed no evidence of infiltrate to suggest pneumonia, no pneumothorax or pleural effusion; heart is of normal size  Treatment and Reassessment: Patient is afebrile and has no adventitious lung sounds on exam, I do not feel that any medication or intervention in the ED is required at this time.   Offered to prescribe patient Tessalon perles, but she states this medication does not work for her.  Discussed OTC options for cough.   Disposition:   After consideration of the diagnostic results and the  patients response to treatment, I feel that emergency department workup does not suggest an emergent condition requiring admission or immediate intervention beyond what has been performed at this time.  The patient is safe for discharge and has been instructed to return immediately for worsening symptoms, change in symptoms or any other concerns.  Discussed supportive care measures for treatment of symptoms at home including use of cough/cold medicines as well as ibuprofen for body aches.  Recommended patient follow-up with her primary care physician if her symptoms persist and do not improve with time.           Final Clinical Impression(s) / ED Diagnoses Final diagnoses:  Acute upper respiratory infection    Rx / DC Orders ED Discharge Orders     None         Pat Kocher, [place] 12/08/21 1103    Audley Hose, MD 12/08/21 1233

## 2021-12-11 ENCOUNTER — Ambulatory Visit (INDEPENDENT_AMBULATORY_CARE_PROVIDER_SITE_OTHER): Payer: Medicaid Other | Admitting: Family Medicine

## 2021-12-11 VITALS — BP 140/92 | HR 91 | Temp 98.3°F | Wt 194.6 lb

## 2021-12-11 DIAGNOSIS — J069 Acute upper respiratory infection, unspecified: Secondary | ICD-10-CM | POA: Diagnosis present

## 2021-12-11 NOTE — Progress Notes (Signed)
    SUBJECTIVE:   CHIEF COMPLAINT / HPI:  Chief Complaint  Patient presents with   Follow-up    Patient was recently seen in the ED 3 days ago on 12/2 for upper respiratory symptoms body aches, rhinorrhea, sore throat, cough ongoing for 3 days at that time.  Reported subjective fever at that time.  Negative COVID, influenza.  CXR obtained which was unremarkable.  Today, patient reports that her symptoms have not improved at all.  She is still having significant cough and congestion, having some difficulty breathing through her nose due to congestion.  She is having some diarrhea as well.  She has been drinking plenty of water.  She has tried various over-the-counter treatments including Mucinex, honey, Nasonex.  She reports Tessalon does not work for her.  She reports she received some medication in the distant past which was helpful but she does not remember the name.  PERTINENT  PMH / PSH: T2DM  Patient Care Team: Shary Key, DO as PCP - General (Family Medicine)   OBJECTIVE:   BP (!) 140/92   Pulse 91   Temp 98.3 F (36.8 C)   Wt 194 lb 9.6 oz (88.3 kg)   SpO2 95%   BMI 39.30 kg/m   Physical Exam Constitutional:      General: She is not in acute distress. HENT:     Head: Normocephalic and atraumatic.     Nose: Congestion and rhinorrhea present.     Mouth/Throat:     Mouth: Mucous membranes are moist.     Pharynx: Oropharynx is clear. No oropharyngeal exudate or posterior oropharyngeal erythema.  Cardiovascular:     Rate and Rhythm: Normal rate and regular rhythm.  Pulmonary:     Effort: Pulmonary effort is normal. No respiratory distress.     Breath sounds: Normal breath sounds.  Abdominal:     Palpations: Abdomen is soft.     Tenderness: There is no abdominal tenderness.  Musculoskeletal:     Cervical back: Neck supple.  Neurological:     Mental Status: She is alert.         12/11/2021   11:15 AM  Depression screen PHQ 2/9  Decreased Interest 1  PHQ  - 2 Score 1  Altered sleeping 1  Tired, decreased energy 2  Change in appetite 2  Feeling bad or failure about yourself  0  Trouble concentrating 0  Moving slowly or fidgety/restless 0  Suicidal thoughts 0  PHQ-9 Score 6     {Show previous vital signs (optional):23777}    ASSESSMENT/PLAN:   Viral URI Ongoing viral prodrome of symptoms for 1 week, doubt bacterial infection at this time.  Discussed that unfortunately, there are not many other treatments that I would recommend for her symptoms as she has already tried various treatments. - continue supportive care - advised to call if not improving in 3 days, could consider antibiotic course for bacterial sinusitis  Return if symptoms worsen or fail to improve.   Zola Button, MD [place]

## 2021-12-11 NOTE — Patient Instructions (Addendum)
It was nice seeing you today!  For cough, I recommend honey. You can continue using over the counter remedies as needed.  If you are not feeling better by Friday, give [place] a call and I can send something in for you.  Stay well, Zola Button, MD [place] [phone]  --  Make sure to check out at the front desk before you leave today.  Please arrive at least 15 minutes prior to your scheduled appointments.  If you had blood work today, I will send you a MyChart message or a letter if results are normal. Otherwise, I will give you a call.  If you had a referral placed, they will call you to set up an appointment. Please give [place] a call if you don't hear back in the next 2 weeks.  If you need additional refills before your next appointment, please call your pharmacy first.

## 2022-01-10 ENCOUNTER — Other Ambulatory Visit: Payer: Self-pay | Admitting: Family Medicine

## 2022-02-04 ENCOUNTER — Other Ambulatory Visit: Payer: Self-pay | Admitting: Family Medicine

## 2022-02-18 ENCOUNTER — Telehealth: Payer: Self-pay

## 2022-02-18 NOTE — Addendum Note (Signed)
Addended by: Dorna Bloom on: 02/18/2022 11:31 AM   Modules accepted: Orders

## 2022-02-18 NOTE — Telephone Encounter (Signed)
Dawn returns my call from [facility].   She reports the patient is able to get [place] and Novolog for the next few months. She reports after that she will need to find a local pharmacy to fill due to having Medicaid now.   She reports she is out of Ozempic at this time and reports this needs to go to a retail pharmacy as of now.   Patient reports [facility] on [address].   Will forward to PCP to send over.

## 2022-02-18 NOTE — Telephone Encounter (Signed)
Patient calls nurse line in regards to [place] and Novolog prescriptions.   She reports these are not covered by her insurance. Per chart review they were sent to the health department pharmacy.   I attempted to call the health department pharmacy to obtain more information. However, I had to LVM.  I will await their call back to discuss.

## 2022-02-19 ENCOUNTER — Other Ambulatory Visit: Payer: Self-pay | Admitting: Family Medicine

## 2022-02-19 MED ORDER — OZEMPIC (2 MG/DOSE) 8 MG/3ML ~~LOC~~ SOPN: 2.0000 mg | PEN_INJECTOR | SUBCUTANEOUS | 3 refills | Status: DC

## 2022-02-22 ENCOUNTER — Telehealth: Payer: Self-pay

## 2022-02-22 ENCOUNTER — Other Ambulatory Visit (HOSPITAL_COMMUNITY): Payer: Self-pay

## 2022-02-22 NOTE — Telephone Encounter (Signed)
A Prior Authorization was initiated for this patients OZEMPIC through CoverMyMeds.   Key: [id number]

## 2022-02-26 ENCOUNTER — Telehealth: Payer: Self-pay

## 2022-02-26 MED ORDER — FLUCONAZOLE 150 MG PO TABS: 150.0000 mg | ORAL_TABLET | Freq: Once | ORAL | 1 refills | Status: DC

## 2022-02-26 NOTE — Telephone Encounter (Signed)
Patient calls nurse line requesting a prescription for Diflucan.   She reports her diabetes is the cause. She reports home CBGs have been in the "upper 200s." She reports compliance with all medications except Ozempic. She has not been able to pick this up yet. We have started a prior auth on this medication for her.   Patient reports thick white discharge with itchy irritation. She reports symptoms started ~ 2 days ago. She reports her PCP told her to call when symptoms started for Diflucan.   Will forward to PCP.   [facility] on [address]

## 2022-02-26 NOTE — Telephone Encounter (Signed)
Reviewed most recent note from Dr. Arby Barrette. Patient w/history of fairly frequent yeast infections, responds to diflucan. Rx sent as requested.  However, patient long overdue for diabetes visit as well as several health maintenance items including pap. Please advise patient to schedule next available appointment with PCP to address.    Alcus Dad, MD PGY-3, [place]

## 2022-02-26 NOTE — Telephone Encounter (Signed)
Prior Auth for patients medication OZEMPIC approved by [place] from 02/22/22 to 02/23/23.  CoverMyMeds Key: [id number]

## 2022-02-26 NOTE — Telephone Encounter (Signed)
Patient aware of Diflucan to the pharmacy.

## 2022-02-26 NOTE — Addendum Note (Signed)
Addended by: Alcus Dad on: 02/26/2022 01:29 PM   Modules accepted: Orders

## 2022-03-25 ENCOUNTER — Ambulatory Visit (INDEPENDENT_AMBULATORY_CARE_PROVIDER_SITE_OTHER): Payer: Medicaid Other | Admitting: Family Medicine

## 2022-03-25 ENCOUNTER — Encounter: Payer: Self-pay | Admitting: Family Medicine

## 2022-03-25 VITALS — BP 132/89 | HR 89 | Ht 59.0 in | Wt 203.0 lb

## 2022-03-25 DIAGNOSIS — K439 Ventral hernia without obstruction or gangrene: Secondary | ICD-10-CM

## 2022-03-25 NOTE — Patient Instructions (Signed)
It was wonderful to see you today.  Please bring ALL of your medications with you to every visit.   Today we talked about:  Hernia - If you have pain and you are not able to push the  hernia back in this is an emergency. Please go to the emergency room if this happens. I am going to put in a referral to general surgery so that they can evaluate you and let you know your options.   Thank you for choosing [facility].   Please call [phone] with any questions about today's appointment.  Lowry Ram, MD  Family Medicine

## 2022-03-26 NOTE — Progress Notes (Signed)
    SUBJECTIVE:   CHIEF COMPLAINT / HPI:   Abdominal pain  Patient reports that she has been having abdominal pain with a bulge in her stomach near her bellybutton for the last 2 weeks.  She says that its gotten worse starting 1 week ago.  She has noticed that there is a bulge above her bellybutton that she can see through her shirt and that sometimes it goes away but when it is outside that she gets nauseous and has pain.  She says that this has happened to her before many years ago but that it went away.  She has been having regular bowel movements.  Denies vomiting.  PERTINENT  PMH / PSH: History of irreducible ventral hernia, cholecystectomy, c-section x 4  OBJECTIVE:   BP 132/89   Pulse 89   Ht 4\' 11"  (1.499 m)   Wt 203 lb (92.1 kg)   SpO2 99%   BMI 41.00 kg/m   General: well appearing, in no acute distress CV: RRR, radial pulses equal and palpable, no BLE edema  Resp: Normal work of breathing on room air, CTAB Abd: Soft, non tender, small soft palpable mass 2cm above umbilicus, increases in size with abdominal crunch, is reducible, no pain on palpation   Neuro: Alert & Oriented x 4    ASSESSMENT/PLAN:   Ventral hernia Has history of ventral hernia in 2016.  At that time it was a reducible and provider referred patient to general surgery for assessment and evaluation however there are no notes of patient going to general surgery at that time.  Now ventral hernia has recurred.  Patient is at higher risk with history of multiple abdominal surgeries including cholecystectomy and C-section x 4.  At this time hernia is reducible and not causing significant pain.  However would recommend that patient is seen by general surgery for elective repair as this is causing intermittent pain and has been at higher risk for strangulation in the past. - Return precautions and precautions for ED given to patient if hernia is not reducible or if she has continued pain - Referral to general surgery  for evaluation for elective hernia repair.     Lowry Ram, MD [place]

## 2022-03-26 NOTE — Assessment & Plan Note (Signed)
Has history of ventral hernia in 2016.  At that time it was a reducible and provider referred patient to general surgery for assessment and evaluation however there are no notes of patient going to general surgery at that time.  Now ventral hernia has recurred.  Patient is at higher risk with history of multiple abdominal surgeries including cholecystectomy and C-section x 4.  At this time hernia is reducible and not causing significant pain.  However would recommend that patient is seen by general surgery for elective repair as this is causing intermittent pain and has been at higher risk for strangulation in the past. - Return precautions and precautions for ED given to patient if hernia is not reducible or if she has continued pain - Referral to general surgery for evaluation for elective hernia repair.

## 2022-03-27 ENCOUNTER — Other Ambulatory Visit: Payer: Self-pay | Admitting: Family Medicine

## 2022-04-08 ENCOUNTER — Ambulatory Visit (INDEPENDENT_AMBULATORY_CARE_PROVIDER_SITE_OTHER): Payer: Medicaid Other | Admitting: Family Medicine

## 2022-04-08 VITALS — BP 138/88 | HR 88 | Temp 97.9°F | Ht 59.0 in | Wt 204.2 lb

## 2022-04-08 DIAGNOSIS — I10 Essential (primary) hypertension: Secondary | ICD-10-CM | POA: Diagnosis not present

## 2022-04-08 DIAGNOSIS — H9202 Otalgia, left ear: Secondary | ICD-10-CM | POA: Diagnosis not present

## 2022-04-08 DIAGNOSIS — E119 Type 2 diabetes mellitus without complications: Secondary | ICD-10-CM | POA: Diagnosis not present

## 2022-04-08 DIAGNOSIS — J40 Bronchitis, not specified as acute or chronic: Secondary | ICD-10-CM

## 2022-04-08 LAB — POCT GLYCOSYLATED HEMOGLOBIN (HGB A1C): HbA1c, POC (controlled diabetic range): 11.7 % — AB (ref 0.0–7.0)

## 2022-04-08 NOTE — Progress Notes (Signed)
    SUBJECTIVE:   CHIEF COMPLAINT / HPI:   Margaret Larsen is a 52 y.o. female who presents to the [facility] clinic today to discuss the following concerns:   Coughing Has been coughing since last Tuesday/Wednesday. Productive with green/yellow phlegm. No fevers. Hard to sleep as she is coughing often. Tried Mucinex but it doesn't seem to be helping- "it seems like it makes it worse". Has not coughed up any blood. Feels SOB with walking. Also has central chest pain with coughing, describes as "pressure, sore". No N/V. Has not been around any sick contacts.   Also has been having intermittent left ear pain since 2 days ago. Feels like an ache. Not affecting hearing. Not popping. Reports it has been a while since her last ear infection. It feels "kinda" similar to previous infection. No drainage. Does not smoke. No mouth/tooth pain. Pain is 8-9/10. Hasn't tried any medication for it.   PERTINENT  PMH / PSH: BPD, GERD, T2DM, obesity   OBJECTIVE:   BP (!) 134/101   Pulse 88   Temp 97.9 F (36.6 C) (Oral)   Ht 4\' 11"  (1.499 m)   Wt 204 lb 4 oz (92.6 kg)   SpO2 100%   BMI 41.25 kg/m    General: NAD, pleasant, able to participate in exam HEENT: Normocephalic, TM erythematous but non-bulging bilaterally, good light reflex. Nares patent, oropharynx clear without erythema or exudates  Cardiac: RRR, no murmurs. Respiratory: CTAB, normal effort, No wheezes, rales or rhonchi. Intermittent dry cough  Abdomen: Obese abdomen  Psych: Normal affect and mood  ASSESSMENT/PLAN:   1. Bronchitis History and examination most consistent with viral bronchitis. Amb pulse ox: maintained oxygen saturation >94% or greater. Lungs clear. Intermittent dry cough appreciated in room. Her chest discomfort is likely MSK in origin, very atypical presentation and with some reproducibility on examination. Discussed likely viral etiology.  Declines Tessalon Perles today.  Recommend honey, cough drops, continue Mucinex.   -Supportive care -Return precautions discussed  2. Type 2 diabetes mellitus without complication, without long-term current use of insulin Uncontrolled, last A1c was in May 2023.  A1c today 11.7.  Long discussion regarding complications of uncontrolled diabetes.  Patient is behind on several health maintenance items, I have scheduled her with her PCP this upcoming Friday for follow up on diabetes. Would benefit also from close follow up with our pharmacy team. - HgB A1c - Lipid Panel - Basic Metabolic Panel - Continue current insulin regimen and discussed to bring medications at next visit for adjustments  3. HYPERTENSION, BENIGN ESSENTIAL Elevated initially though improved on follow up. Will check BMP as not UTD.  - Basic Metabolic Panel - Recommend urine microalbumin at follow up   4. Otalgia of left ear Likely viral. Recommend supportive care with Tylenol. Avoid NSAID for now as I'm not sure how her renal function is. Return if worsening or no improvement.  HM Due for several health maintenance items such as Pap, mammogram. Urine microalbumin. Hep C screening, eye examination. Will need frequent and close follow ups.    Sharion Settler, [place]

## 2022-04-08 NOTE — Progress Notes (Signed)
Walking ambulatory 94% 100BPM

## 2022-04-08 NOTE — Patient Instructions (Addendum)
It was wonderful to see you today.  Please bring ALL of your medications with you to every visit.   Today we talked about:  This is most likely a viral infection. This will take time to get over. The treatment for this is supportive care. You can alternate Tylenol every 6 hours. You can give a teaspoon of honey by itself or mixed with water to help with cough. Steam baths, Vicks vapor rub, a humidifier and nasal saline spray can help with congestion.   It is important to keep hydrated throughout this time!  Frequent hand washing to prevent recurrent illnesses is important.   Please return for worsening shortness of breath, chest pain, if you can't keep fluids down.   You are behind on several health maintenance items.I have scheduled you an appointment with your PCP on Friday. Please bring your medications.  Your diabetes is not well-controlled. Please go to your visit on Friday for medication adjustments.   Thank you for coming to your visit as scheduled. We have had a large "no-show" problem lately, and this significantly limits our ability to see and care for patients. As a friendly reminder- if you cannot make your appointment please call to cancel. We do have a no show policy for those who do not cancel within 24 hours. Our policy is that if you miss or fail to cancel an appointment within 24 hours, 3 times in a 65-month period, you may be dismissed from our clinic.   Thank you for choosing [facility].   Please call [phone] with any questions about today's appointment.  Please be sure to schedule follow up at the front  desk before you leave today.   Sharion Settler, DO PGY-3 Family Medicine

## 2022-04-09 ENCOUNTER — Other Ambulatory Visit: Payer: Self-pay | Admitting: Family Medicine

## 2022-04-09 LAB — BASIC METABOLIC PANEL
BUN/Creatinine Ratio: 14 (ref 9–23)
BUN: 7 mg/dL (ref 6–24)
CO2: 27 mmol/L (ref 20–29)
Calcium: 9.3 mg/dL (ref 8.7–10.2)
Chloride: 96 mmol/L (ref 96–106)
Creatinine, Ser: 0.51 mg/dL — ABNORMAL LOW (ref 0.57–1.00)
Glucose: 154 mg/dL — ABNORMAL HIGH (ref 70–99)
Potassium: 3.9 mmol/L (ref 3.5–5.2)
Sodium: 136 mmol/L (ref 134–144)
eGFR: 112 mL/min/{1.73_m2} (ref >59)

## 2022-04-09 LAB — LIPID PANEL
Chol/HDL Ratio: 4 ratio (ref 0.0–4.4)
Cholesterol, Total: 210 mg/dL — ABNORMAL HIGH (ref 100–199)
HDL: 52 mg/dL (ref >39)
LDL Chol Calc (NIH): 140 mg/dL — ABNORMAL HIGH (ref 0–99)
Triglycerides: 99 mg/dL (ref 0–149)
VLDL Cholesterol Cal: 18 mg/dL (ref 5–40)

## 2022-04-09 IMAGING — DX DG CHEST 1V PORT
1 series · 1 of 1 positions shown · non-contrast
Comparison: Chest x-ray dated 03/18/2018.

CLINICAL DATA: COVID, cough, shortness of breath

EXAM:
PORTABLE CHEST 1 VIEW

[chest ap]
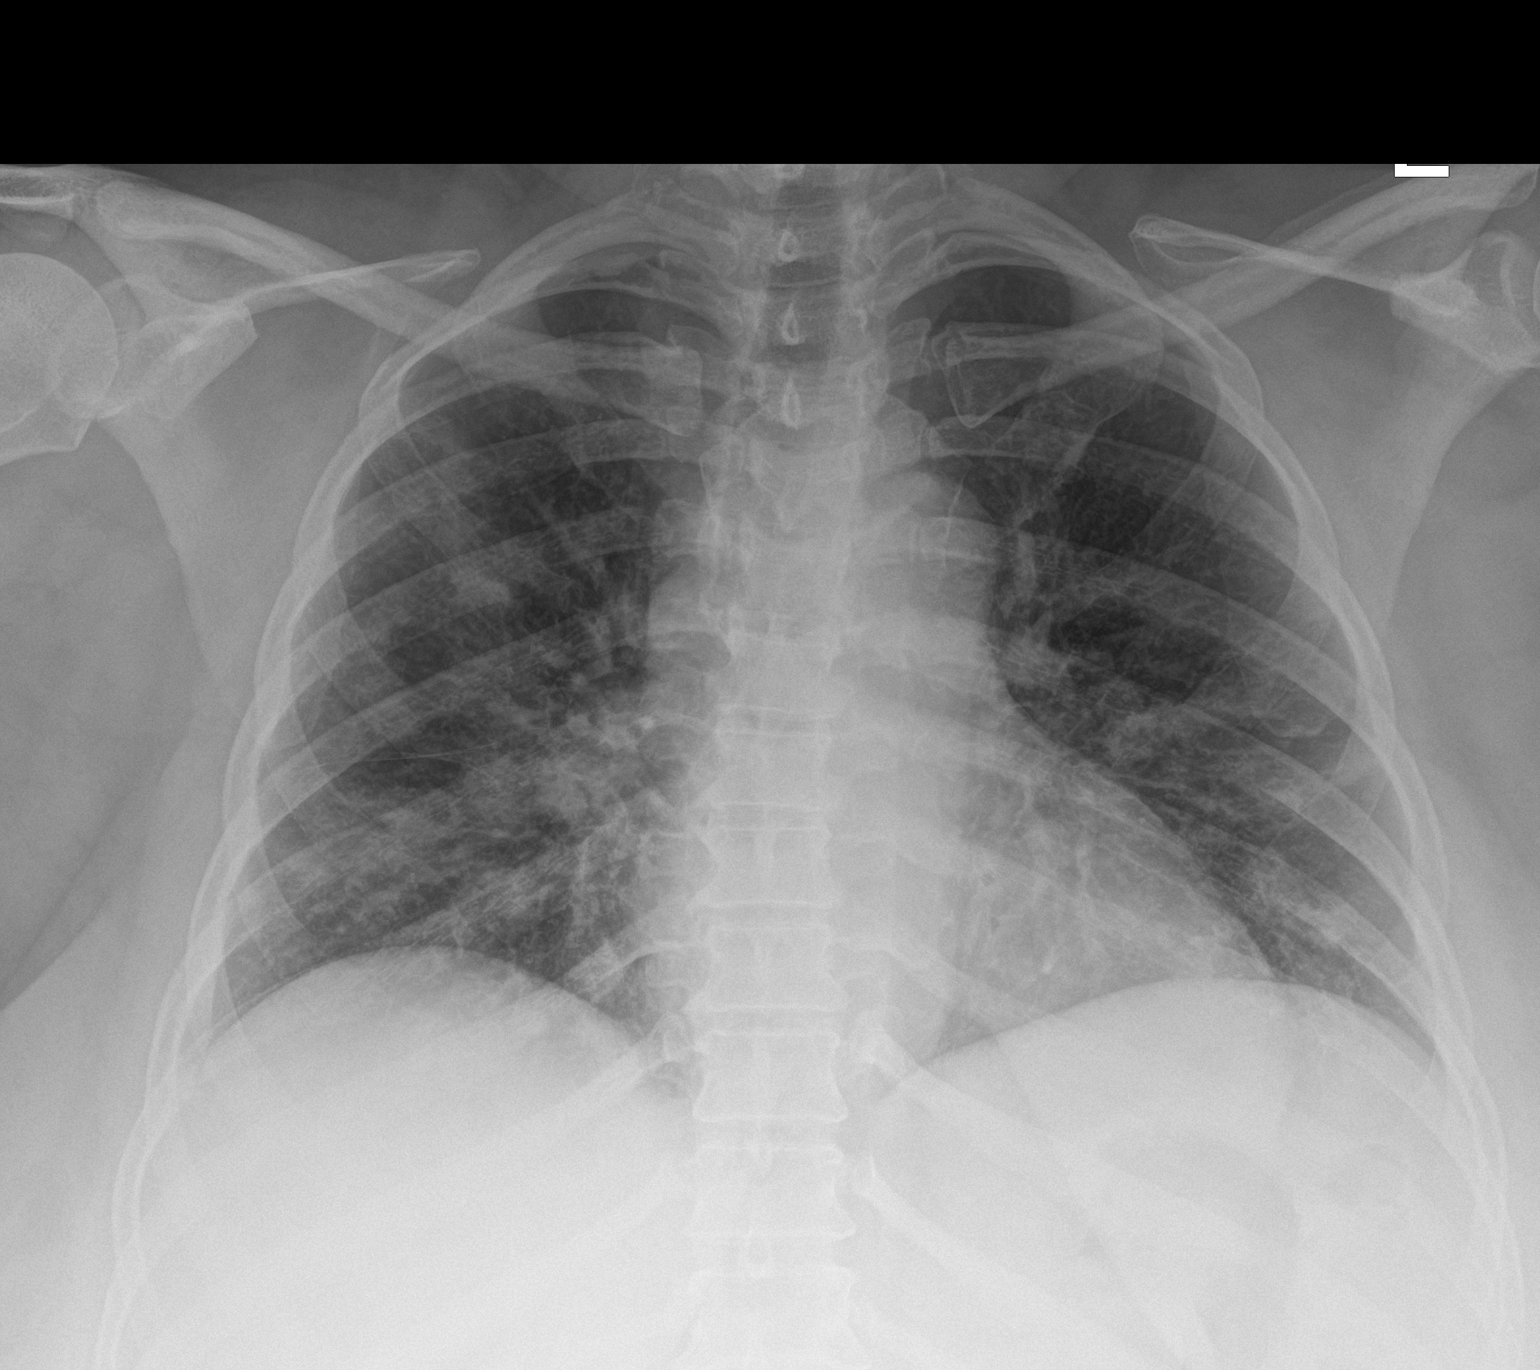

[1 of 1 positions shown; findings below may reference images not displayed]

FINDINGS: Patchy perihilar and bibasilar airspace opacities, consistent with
multifocal pneumonia. No pleural effusion or pneumothorax is seen.
Heart size and mediastinal contours are stable. Osseous structures
about the chest are unremarkable.
IMPRESSION: Patchy perihilar and bibasilar airspace opacities, consistent with
multifocal pneumonia, presumably COVID pneumonia.

## 2022-04-09 MED ORDER — ROSUVASTATIN CALCIUM 20 MG PO TABS: 20.0000 mg | ORAL_TABLET | Freq: Every day | ORAL | 1 refills | Status: DC

## 2022-04-12 ENCOUNTER — Encounter: Payer: Self-pay | Admitting: Family Medicine

## 2022-04-12 ENCOUNTER — Other Ambulatory Visit (HOSPITAL_COMMUNITY)
Admission: RE | Admit: 2022-04-12 | Discharge: 2022-04-12 | Disposition: A | Discharge status: Home / Self Care | Payer: Medicaid Other | Source: Ambulatory Visit | Attending: Family Medicine | Admitting: Family Medicine

## 2022-04-12 ENCOUNTER — Ambulatory Visit (INDEPENDENT_AMBULATORY_CARE_PROVIDER_SITE_OTHER): Payer: Medicaid Other | Admitting: Family Medicine

## 2022-04-12 VITALS — BP 129/76 | HR 81 | Wt 205.0 lb

## 2022-04-12 DIAGNOSIS — I10 Essential (primary) hypertension: Secondary | ICD-10-CM | POA: Diagnosis not present

## 2022-04-12 DIAGNOSIS — E119 Type 2 diabetes mellitus without complications: Secondary | ICD-10-CM | POA: Diagnosis not present

## 2022-04-12 DIAGNOSIS — Z1231 Encounter for screening mammogram for malignant neoplasm of breast: Secondary | ICD-10-CM

## 2022-04-12 DIAGNOSIS — Z124 Encounter for screening for malignant neoplasm of cervix: Secondary | ICD-10-CM | POA: Insufficient documentation

## 2022-04-12 NOTE — Patient Instructions (Addendum)
It was great seeing you today!  Today we took care of some health maintenance items!   We did your pap smear to check for cervical cancer and checked your urine for further examine your kidney function. I will call you if anything is abnormal or will send a mychart message if normal  I have ordered for your mammogram. Please call [phone] to schedule to get that done   Please check-out at the front desk before leaving the clinic. Schedule an appointment with the pharmacy team for diabetes management. I'd like to see you back in 4 weeks after you meet with the pharmacy team, but if you need to be seen earlier than that for any new issues we're happy to fit you in, just give [place] a call!  Feel free to call with any questions or concerns at any time, at [phone].   Take care,  Dr. Cora Collum [facility]

## 2022-04-12 NOTE — Progress Notes (Unsigned)
    SUBJECTIVE:   CHIEF COMPLAINT / HPI:   Patient presents for follow up from recent clinic visit to take care of some health maintenance items.   DM2: A1c at last visit 11.7. Patient takes 75 units during the day, 45 units short acting when she eats a large meal, Ozempic 2mg  weekly Patient did not have medications with her States she did see Optho in the past year  Open to referral to pharmacy team for further diabetes management   Open to  pap smear today. Does have history of abnormal pap  Due for mammogram    PERTINENT  PMH / PSH: Reviewed   OBJECTIVE:   BP 129/76 (BP Location: Right Arm, Patient Position: Sitting, Cuff Size: Large)   Pulse 81   Wt 205 lb (93 kg)   SpO2 98%   BMI 41.40 kg/m    Physical exam General: well appearing, NAD Cardiovascular: RRR, no murmurs Lungs: CTAB. Normal WOB Abdomen: soft, non-distended, non-tender Skin: warm, dry. No edema Pelvic exam: normal external genitalia, vulva, vagina, cervix  Chaperone: CMA Carly Mack   ASSESSMENT/PLAN:   Type 2 diabetes mellitus without complication, without long-term current use of insulin (HCC) A1c 11.7 which was checked at last visit. Current regimen includes 75 units during the day, 45 units short acting when she eats a large meal, Ozempic 2mg  weekly. She did not bring meds to this visit and I am not clear if she is adherent to regimen - Urine ACR ordered. Will likely benefit from Ace/Arb. Can discuss at follow up - Appointment scheduled with Dr. Raymondo Band and then myself and we can adjust insulin regimen at that time.   Cervical cancer screening Pap in 2015 with ASCUS. Denies being sexually active. Pelvic exam unremarkable.  - Cytology + STIs ordered   Breast cancer screen - Mammogram ordered    Cora Collum, DO [facility]

## 2022-04-14 DIAGNOSIS — Z124 Encounter for screening for malignant neoplasm of cervix: Secondary | ICD-10-CM | POA: Insufficient documentation

## 2022-04-14 LAB — MICROALBUMIN / CREATININE URINE RATIO
Creatinine, Urine: 135.4 mg/dL
Microalb/Creat Ratio: 18 mg/g creat (ref 0–29)
Microalbumin, Urine: 23.9 ug/mL

## 2022-04-14 NOTE — Assessment & Plan Note (Addendum)
A1c 11.7 which was checked at last visit. Current regimen includes 75 units during the day, 45 units short acting when she eats a large meal, Ozempic 2mg  weekly. She did not bring meds to this visit and I am not clear if she is adherent to regimen - Urine ACR ordered. Will likely benefit from Ace/Arb. Can discuss at follow up - Appointment scheduled with Dr. Raymondo Band and then myself and we can adjust insulin regimen at that time.

## 2022-04-14 NOTE — Assessment & Plan Note (Signed)
Pap in 2015 with ASCUS. Denies being sexually active. Pelvic exam unremarkable.  - Cytology + STIs ordered

## 2022-04-16 LAB — CYTOLOGY - PAP
Adequacy: ABSENT
Chlamydia: NEGATIVE
Comment: NEGATIVE
Comment: NEGATIVE
Comment: NORMAL
Diagnosis: UNDETERMINED — AB
High risk HPV: NEGATIVE
Neisseria Gonorrhea: NEGATIVE

## 2022-04-25 ENCOUNTER — Telehealth: Payer: Self-pay

## 2022-04-25 NOTE — Telephone Encounter (Signed)
Patient calls nurse line requesting glucometer and supplies.   Patient reports she was getting DM supplies through the MAP program, however she now has medicaid.   Please send to [facility] on [address].

## 2022-04-27 ENCOUNTER — Other Ambulatory Visit: Payer: Self-pay | Admitting: Family Medicine

## 2022-04-27 MED ORDER — BLOOD GLUCOSE MONITORING SUPPL DEVI: 1.0000 | Freq: Three times a day (TID) | 0 refills | Status: DC

## 2022-04-27 MED ORDER — BLOOD GLUCOSE TEST VI STRP: 1.0000 | ORAL_STRIP | Freq: Three times a day (TID) | 0 refills | Status: DC

## 2022-04-27 MED ORDER — LANCET DEVICE MISC: 1.0000 | Freq: Three times a day (TID) | 0 refills | Status: AC

## 2022-04-27 MED ORDER — LANCETS MISC. MISC: 1.0000 | Freq: Three times a day (TID) | 0 refills | Status: AC

## 2022-04-29 ENCOUNTER — Other Ambulatory Visit: Payer: Self-pay | Admitting: Family Medicine

## 2022-04-30 ENCOUNTER — Ambulatory Visit (INDEPENDENT_AMBULATORY_CARE_PROVIDER_SITE_OTHER): Payer: Medicaid Other | Admitting: Pharmacist

## 2022-04-30 VITALS — BP 135/80 | HR 92 | Wt 206.0 lb

## 2022-04-30 DIAGNOSIS — E119 Type 2 diabetes mellitus without complications: Secondary | ICD-10-CM | POA: Diagnosis not present

## 2022-04-30 DIAGNOSIS — J029 Acute pharyngitis, unspecified: Secondary | ICD-10-CM | POA: Diagnosis not present

## 2022-04-30 DIAGNOSIS — J302 Other seasonal allergic rhinitis: Secondary | ICD-10-CM

## 2022-04-30 MED ORDER — MOMETASONE FUROATE 50 MCG/ACT NA SUSP
NASAL | 3 refills | Status: DC
Start: 2022-04-30 — End: 2022-05-06

## 2022-04-30 MED ORDER — TRESIBA FLEXTOUCH 200 UNIT/ML ~~LOC~~ SOPN: 70.0000 [IU] | PEN_INJECTOR | Freq: Every day | SUBCUTANEOUS | 0 refills | Status: DC

## 2022-04-30 NOTE — Progress Notes (Unsigned)
S:     Chief Complaint  Patient presents with   Medication Management    DM2   52 y.o. female who presents for diabetes evaluation, education, and management.  PMH is significant for DM2, bipolar, HLD, HTN.  Patient was referred and last seen by Primary Care Provider, Dr. Idalia Needle, on 05/09/21. Last Rx visit 04/05/21.   At last PCP visit, patient was seen for cervical cancer screen. A1c from 04/08/22 11.7. Of note, patient has recently been approved for Medicaid.   Today, patient arrives in good spirits and presents without any assistance. Unable to [employer] 3 app today in clinic since she is unable to remember her apple ID password.    Current diabetes medications include: Tresiba (insulin degludec) 75 units, Novolog (insulin aspart) 45 units during a meal   Patient reports adherence to taking all medications as prescribed.  Patient reports missing medications throughout the week, due to forgetting her doses.   Do you feel that your medications are working for you? yes Have you been experiencing any side effects to the medications prescribed? Yes - has side effects of low blood sugar when glucose in the low 100s  Do you have any problems obtaining medications due to transportation or finances? yes Insurance coverage: [place] Medicaid UHC   Patient denies hypoglycemic events. Expresses concern with hypoglycemic incident in the past from novolog from several years ago. Endorses feeling weird when she is in the 100s. Lows occur overnight. No symptoms of lows after administration of Novolog at her daily meal.   Reported home fasting blood sugars: 100s   Reported 2 hour post-meal/random blood sugars: 100-300s   Patient reports nocturia (nighttime urination). 1-2x during the night but improved Patient reports neuropathy (nerve pain). In legs and feet unchanged  Patient reports visual changes. When blood sugar goes low is symptoms when below high 100s. Blurry vision.  Patient denies self  foot exams.   Patient reported dietary habits: Eats 1 meals/day - usually has one big meal at lunch and then snacks throughout the day. Has been eating one meal a day since starting Ozempic.  Breakfast: n/a Lunch: big meal - salad from [employer], sandwich, chicken nuggets  Dinner: n/a Snacks: chips (single bags), crackers, sugar free jello, sometimes sweets. Wants sweets less since Ozempic  Drinks: water, cranberry juice     O:   Review of Systems  Constitutional: Negative.   Eyes:  Positive for blurred vision.  Genitourinary:  Positive for frequency.  Neurological:  Positive for tingling.    Physical Exam Vitals reviewed.  Constitutional:      Appearance: Normal appearance.  Cardiovascular:     Rate and Rhythm: Normal rate.  Musculoskeletal:        General: Normal range of motion.  Neurological:     Mental Status: She is alert and oriented to person, place, and time. Mental status is at baseline.  Psychiatric:        Mood and Affect: Mood normal.        Behavior: Behavior normal.        Thought Content: Thought content normal.        Judgment: Judgment normal.       Lab Results  Component Value Date   HGBA1C 11.7 (A) 04/08/2022   Vitals:   04/30/22 1638  BP: 135/80  Pulse: 92  SpO2: 96%    Lipid Panel     Component Value Date/Time   CHOL 210 (H) 04/08/2022 1755   TRIG  99 04/08/2022 1755   HDL 52 04/08/2022 1755   CHOLHDL 4.0 04/08/2022 1755   CHOLHDL 4.6 05/19/2015 0933   VLDL 27 05/19/2015 0933   LDLCALC 140 (H) 04/08/2022 1755    Clinical Atherosclerotic Cardiovascular Disease (ASCVD): No  The 10-year ASCVD risk score (Arnett DK, et al., 2019) is: 8.3%   Values used to calculate the score:     Age: 52 years     Sex: Female     Is Non-Hispanic African American: Yes     Diabetic: Yes     Tobacco smoker: No     Systolic Blood Pressure: 135 mmHg     Is BP treated: No     HDL Cholesterol: 52 mg/dL     Total Cholesterol: 210 mg/dL   Patient is  participating in a Managed Medicaid Plan:  Yes   A/P: Diabetes longstanding currently uncontrolled with A1c 11.7%. Patient is able to verbalize appropriate hypoglycemia management plan. Control is suboptimal due to medication non-adherence.  -Unable to [employer] 3 today due to not knowing Apple ID password. Patient instructed to download [place] 3 app at home. Will follow up later this week to help her set up her monitor.  -Decreased dose of basal insulin Tresiba (insulin degludec) from 75 units to 70 units daily.  -Continued rapid insulin Novolog (insulin aspart) 45 units during meal.  -Continued GLP-1 Ozempic (semaglutide) 2 mg once weekly   - Continue to hold  SGLT2-I  Jardiance (empagliflozin) 10 mg with increased A1c. Counseled on sick day rules. -Patient educated on purpose, proper use, and potential adverse effects of medications.  -Extensively discussed pathophysiology of diabetes, recommended lifestyle interventions, dietary effects on blood sugar control.  -Counseled on s/sx of and management of hypoglycemia.    Written patient instructions provided. Patient verbalized understanding of treatment plan.  Total time in face to face counseling 40 minutes.    Follow-up:  Pharmacist 05/02/22. PCP clinic visit in 05/14/22.  Patient seen with Jerry Caras, PharmD PGY-1 Pharmacy Resident and Revonda Standard, PharmD Candidate.

## 2022-04-30 NOTE — Patient Instructions (Addendum)
It was nice to see you today!  Your goal blood sugar is 80-130 before eating and less than 180 after eating.  Medication Changes: Continue Ozempic (semaglutide) 2 mg once weekly   Continue Novolog (insulin aspart) 45 units with meals  Decrease Tresiba (insulin degludec) to 70 units daily    Monitor blood sugars at home and keep a log (glucometer or piece of paper) to bring with you to your next visit.  Aim for a diet full of vegetables, fruit and lean meats (chicken, [place], fish). Try to limit salt intake by eating fresh or frozen vegetables (instead of canned), rinse canned vegetables prior to cooking and do not add any additional salt to meals.

## 2022-05-01 ENCOUNTER — Encounter: Payer: Self-pay | Admitting: Pharmacist

## 2022-05-01 NOTE — Assessment & Plan Note (Signed)
Diabetes longstanding currently uncontrolled with A1c 11.7%. Patient is able to verbalize appropriate hypoglycemia management plan. Control is suboptimal due to medication non-adherence.  -Unable to [employer] today due to not knowing Apple ID password. Patient instructed to download [employer] 3 app at home. Will follow up later this week to help her set up her monitor.  -Decreased dose of basal insulin Tresiba (insulin degludec) from 75 units to 70 units daily.  -Continued rapid insulin Novolog (insulin aspart) 45 units during largest meal.  -Continued GLP-1 Ozempic (semaglutide) 2 mg once weekly   -Continue to hold SGLT2-I  Jardiance (empagliflozin) 10 mg with increased A1c. Counseled on sick day rules. Plan to restart in near future when glycemic control improved.

## 2022-05-01 NOTE — Progress Notes (Signed)
Reviewed and agree with Dr Koval's plan.   

## 2022-05-02 ENCOUNTER — Encounter: Payer: Self-pay | Admitting: Pharmacist

## 2022-05-02 ENCOUNTER — Ambulatory Visit (INDEPENDENT_AMBULATORY_CARE_PROVIDER_SITE_OTHER): Payer: Medicaid Other | Admitting: Pharmacist

## 2022-05-02 DIAGNOSIS — E119 Type 2 diabetes mellitus without complications: Secondary | ICD-10-CM | POA: Diagnosis not present

## 2022-05-02 MED ORDER — FREESTYLE LIBRE 3 SENSOR MISC
11 refills | Status: DC
Start: 2022-05-02 — End: 2022-11-14

## 2022-05-02 NOTE — Patient Instructions (Signed)
It was great seeing you today!  Sensor Application If using the App, you can tap Help in the Main Menu to access an in-app tutorial on applying a Sensor. See below for instructions on how to download the app. Apply Sensors only on the back of your upper arm. If placed in other areas, the Sensor may not function properly and could give you inaccurate readings. Avoid areas with scars, moles, stretch marks, or lumps.   Select an area of skin that generally stays flat during your normal daily activities (no bending or folding). Choose a site that is at least 1 inch (2.5 cm) away from any injection sites. To prevent discomfort or skin irritation, you should select a different site other than the one most recently used. Wash application site using a plain soap, dry, and then clean with an alcohol wipe. This will help remove any oily residue that may prevent the sensor from sticking properly. Allow site to air dry before proceeding. Note: The area MUST be clean and dry, or the Sensor may not stay on for the full wear duration specified by your Sensor insert. 4. Unscrew the cap from the Sensor Applicator and set the cap aside.  5. Place the Sensor Applicator over the prepared site and push down firmly to apply the Sensor to your body. 6. Gently pull the Sensor Applicator away from your body. The Sensor should now be attached to your skin. 7. Make sure the Sensor is secure after application. Put the cap back on the Sensor Applicator. Discard the used Sensor Applicator according to local regulations.  What If My Sensor Falls Off or What If My Sensor Isn't Working? Call [employer] Customer Care Team at [phone] Available 7 days a week from 8AM-8PM EST, excluding holidays If yo have multiple sensors fall off prior to 14 days of use, contact [facility] at [phone]   The App Download the FreeStyle Libre 3 App in your phone's app store   Load the app and select get started now Create  an account  Tap scan new sensor Follow the prompts on the screen. If your sensor does not sync, try moving your phone slowly around the sensor. Phone cases may affect scanning. This will be the only time you have to scan the sensor until you apply a new sensor in 14 days.  There will be a 60 minute start up period until the app will display your glucose reading   How To Share Your Readings With Us Once in the app, go to settings -> connected apps -> LibreView -> Enter Practice ID -> [id number]   

## 2022-05-02 NOTE — Progress Notes (Signed)
S:     Chief Complaint  Patient presents with   Medication Management    Sensor Placement   52 y.o. female who presents for diabetes evaluation, education, and management.  PMH is significant for DM2, bipolar, HLD, HTN.  Patient was referred and last seen by Primary Care Provider, Dr. Idalia Needle, on 05/09/21. Last Rx visit 04/05/21.    At last PCP visit, patient was seen for cervical cancer screen. A1c from 04/08/22 11.7  Of note, patient has recently been approved for Medicaid.    Today, patient arrives in good spirits and presents without any assistance. Patient returns with [facility] 3 app downloaded and ready to apply CGM sensor   Current diabetes medications include: Tresiba (insulin degludec) 70 units, Novolog (insulin aspart) 45 units during a meal   Patient reports adherence to taking all medications as prescribed.  Patient reports missing medications throughout the week, due to forgetting her doses.    Do you feel that your medications are working for you? yes Have you been experiencing any side effects to the medications prescribed? Yes - has side effects of low blood sugar when glucose in the low 100s  Do you have any problems obtaining medications due to transportation or finances? yes Insurance coverage: [place] Medicaid UHC    Patient denies hypoglycemic events. Expresses concern with hypoglycemic incident in the past from novolog from several years ago. Endorses feeling weird when she is in the 100s. Lows occur overnight. No symptoms of lows after administration of Novolog at her daily meal.    Reported home fasting blood sugars: 100s   Reported 2 hour post-meal/random blood sugars: 100-300s    Patient reports nocturia (nighttime urination). 1-2x during the night but improved Patient reports neuropathy (nerve pain). In legs and feet unchanged  Patient reports visual changes. When blood sugar goes low is symptoms when below high 100s. Blurry vision.  Patient denies self foot exams.     Patient reported dietary habits: Eats 1 meals/day - usually has one big meal at lunch and then snacks throughout the day. Has been eating one meal a day since starting Ozempic.  Breakfast: n/a Lunch: big meal - salad from [employer], sandwich, chicken nuggets  Dinner: n/a Snacks: chips (single bags), crackers, sugar free jello, sometimes sweets. Wants sweets less since Ozempic  Drinks: water, cranberry juice    O:   Review of Systems  Eyes:  Positive for blurred vision.  Genitourinary:  Positive for frequency.  Neurological:  Positive for tingling.  All other systems reviewed and are negative.   Physical Exam Constitutional:      Appearance: Normal appearance.  Neurological:     Mental Status: She is alert.  Psychiatric:        Mood and Affect: Mood normal.        Behavior: Behavior normal.        Thought Content: Thought content normal.        Judgment: Judgment normal.      Lab Results  Component Value Date   HGBA1C 11.7 (A) 04/08/2022    Lipid Panel     Component Value Date/Time   CHOL 210 (H) 04/08/2022 1755   TRIG 99 04/08/2022 1755   HDL 52 04/08/2022 1755   CHOLHDL 4.0 04/08/2022 1755   CHOLHDL 4.6 05/19/2015 0933   VLDL 27 05/19/2015 0933   LDLCALC 140 (H) 04/08/2022 1755    Clinical Atherosclerotic Cardiovascular Disease (ASCVD): No  The 10-year ASCVD risk score (Arnett DK, et al.,  2019) is: 8.3%   Values used to calculate the score:     Age: 16 years     Sex: Female     Is Non-Hispanic African American: Yes     Diabetic: Yes     Tobacco smoker: No     Systolic Blood Pressure: 135 mmHg     Is BP treated: No     HDL Cholesterol: 52 mg/dL     Total Cholesterol: 210 mg/dL   Patient is participating in a Managed Medicaid Plan:  Yes   A/P: Diabetes longstanding currently uncontrolled with A1c 11.7%. Patient is able to verbalize appropriate hypoglycemia management plan. Control is suboptimal due to medication non-adherence.  -[employer] 3 sensor  onto patient's arm and connected sensor to smartphone app. Educated patient on how CGM will help her be more aware of glucose trends. Patient demonstrated understanding of process to apply sensor to body -Continued long acting insulin Tresiba (insulin degludec) 70 units daily (plan to switch at next visit to Medicaid covered long-acting insulin Lantus) -Continued rapid insulin Novolog (insulin aspart) 45 units during largest meal.  -Continued GLP-1 Ozempic (semaglutide) 2 mg once weekly   - Continue to hold  GLT2-I  Jardiance (empagliflozin) 10 mg with increased A1c. Counseled on sick day rules. Plan to restart in near future when glycemic control improved.  -Patient educated on purpose, proper use, and potential adverse effects of medications.  -Extensively discussed pathophysiology of diabetes, recommended lifestyle interventions, dietary effects on blood sugar control.  -Counseled on s/sx of and management of hypoglycemia.      Written patient instructions provided. Patient verbalized understanding of treatment plan.  Total time in face to face counseling 40 minutes.     Follow-up:  Pharmacist 05/21/22. PCP clinic visit in 05/14/22.  Patient seen with Revonda Standard, PharmD Candidate.

## 2022-05-02 NOTE — Assessment & Plan Note (Signed)
Diabetes longstanding currently uncontrolled with A1c 11.7%. Patient is able to verbalize appropriate hypoglycemia management plan. Control is suboptimal due to medication non-adherence.  -[employer] 3 sensor onto patient's arm and connected sensor to smartphone app. Educated patient on how CGM will help her be more aware of glucose trends. Patient demonstrated understanding of process to apply sensor to body -Continued long acting insulin Tresiba (insulin degludec) 70 units daily (plan to switch at next visit to Medicaid covered long-acting insulin Lantus) -Continued rapid insulin Novolog (insulin aspart) 45 units during largest meal.  -Continued GLP-1 Ozempic (semaglutide) 2 mg once weekly   - Continue to hold  GLT2-I  Jardiance (empagliflozin) 10 mg with increased A1c. Counseled on sick day rules. Plan to restart in near future when glycemic control improved.  -Patient educated on purpose, proper use, and potential adverse effects of medications.

## 2022-05-03 ENCOUNTER — Telehealth: Payer: Self-pay

## 2022-05-03 NOTE — Telephone Encounter (Signed)
Rec'd [facility] request for patients generic nasonex, mometasone.   Medicaid prefers: Azelastine spray (Astelin), Dymista, fluticasone (Flonase), ipratropium spray (Atrovent Nasal), olopatadine nasal spray (Patanase).   Could one of these alternatives be sent in?

## 2022-05-03 NOTE — Progress Notes (Signed)
Reviewed and agree with Dr Koval's plan.   

## 2022-05-06 ENCOUNTER — Telehealth: Payer: Self-pay

## 2022-05-06 MED ORDER — FLUTICASONE PROPIONATE 50 MCG/ACT NA SUSP: 2.0000 | Freq: Every day | NASAL | 6 refills | Status: DC

## 2022-05-06 NOTE — Telephone Encounter (Signed)
A Prior Authorization was initiated for this patients FreeStyle Libre 3 Sensor through [employer].   Key: [id number]

## 2022-05-06 NOTE — Telephone Encounter (Signed)
Reviewed and agree with Dr Koval's plan.   

## 2022-05-06 NOTE — Telephone Encounter (Signed)
Attempted to contact patient to discuss allergy medication prescription alternative update.   Shared Voice Mail - HIPAA compliant  That a new prescription for her was sent to her pharmacy.

## 2022-05-07 NOTE — Telephone Encounter (Signed)
Prior Auth for patients medication FREESTYLE LIBRE 3 SENSOR approved by [employer] from 05/06/22 to 11/05/22.  CoverMyMeds Key: [id number] [place] Case ID #: [id number]

## 2022-05-14 ENCOUNTER — Ambulatory Visit: Payer: Medicaid Other | Admitting: Family Medicine

## 2022-05-15 ENCOUNTER — Telehealth: Payer: Self-pay

## 2022-05-15 NOTE — Telephone Encounter (Signed)
Margaret Larsen with [facility] calls nurse line in regards to Novolog.   She reports the patient as given a 30 day supply on 4/10, however reported she has been out for several days.   Margaret Larsen reports the patient reported to her she is doing "more" than 45 units with meals. However unsure how many units she is actually doing.   Spoke with patient and she reports she was not clear on what dosage she should be taking. She reports she has been doing 50 units twice per day with meals.   Will forward to Koval to get correct dosage. It appears she is only doing 45 units with meals per office notes.

## 2022-05-17 NOTE — Telephone Encounter (Addendum)
[facility]   They provided 9 pens at last fill   Patient is now transitioning to Medicaid.  New prescription at upcoming visit with me 5/14   I thanked Dawn for all of her help with patients like this one over many years that are now transitioning away from the [facility] to other pharmacies.

## 2022-05-21 ENCOUNTER — Encounter: Payer: Self-pay | Admitting: Pharmacist

## 2022-05-21 ENCOUNTER — Ambulatory Visit (INDEPENDENT_AMBULATORY_CARE_PROVIDER_SITE_OTHER): Payer: Medicaid Other | Admitting: Pharmacist

## 2022-05-21 VITALS — BP 111/74 | HR 80 | Ht 60.24 in | Wt 205.0 lb

## 2022-05-21 DIAGNOSIS — E119 Type 2 diabetes mellitus without complications: Secondary | ICD-10-CM

## 2022-05-21 DIAGNOSIS — Z794 Long term (current) use of insulin: Secondary | ICD-10-CM

## 2022-05-21 MED ORDER — TRESIBA FLEXTOUCH 200 UNIT/ML ~~LOC~~ SOPN: 75.0000 [IU] | PEN_INJECTOR | Freq: Every day | SUBCUTANEOUS | 0 refills | Status: DC

## 2022-05-21 MED ORDER — EMPAGLIFLOZIN 10 MG PO TABS
10.0000 mg | ORAL_TABLET | Freq: Every day | ORAL | 6 refills | Status: DC
Start: 2022-05-21 — End: 2022-08-01

## 2022-05-21 MED ORDER — NOVOLOG FLEXPEN 100 UNIT/ML ~~LOC~~ SOPN: 50.0000 [IU] | PEN_INJECTOR | Freq: Two times a day (BID) | SUBCUTANEOUS | 3 refills | Status: DC

## 2022-05-21 NOTE — Patient Instructions (Addendum)
It was nice to see you today!  Your next visit with the Pharmacist, Dr. Madelon Lips, is on 06/11/2022 at 3:30 PM.  Your goal blood sugar is 80-130 before eating and less than 180 after eating.  Medication Changes: There are no changes to your medications. Continue taking all your medications.   Monitor blood sugars at home and keep a log (glucometer or piece of paper) to bring with you to your next visit.  Keep up the good work with diet and exercise. Aim for a diet full of vegetables, fruit and lean meats (chicken, [place], fish). Try to limit salt intake by eating fresh or frozen vegetables (instead of canned), rinse canned vegetables prior to cooking and do not add any additional salt to meals.

## 2022-05-21 NOTE — Assessment & Plan Note (Signed)
Diabetes longstanding currently controlled with GMI of 7.4% and average glucose of 169 mg/dL. Patient is able to verbalize appropriate hypoglycemia management plan. Medication adherence appears optimal.  -Not initiated on new therapies. Insulin dose continued at the dose the patient has been taking: insulin aspart (Novolog) adjusted to 50 units BID with meal from 45 units BID with meals and insulin degludec Evaristo Bury) 75 units daily from 70 units daily.  -Patient educated on strategies to avoid loss of CGM sensor. Patient provided and assisted with the application of one CGM sensor during the visit. Patient provided and counseled on use of CGM sensor patch/adhesive. -Extensively discussed pathophysiology of diabetes, recommended lifestyle interventions, dietary effects on blood sugar control.  -Resumed and sent a prescription for empagliflozin (JARDIANCE) 10 MG once daily.  -Counseled on s/sx of and management of hypoglycemia.  -Next A1c anticipated in 2 months. Last A1c at 11.7% on 04/08/2022.

## 2022-05-21 NOTE — Progress Notes (Signed)
S:     Chief Complaint  Patient presents with   Medication Management    T2DM/CGM Follow-up   52 y.o. female who presents for diabetes evaluation, education, and management.  PMH is significant for DM2, bipolar, HLD, HTN.  Patient was referred and last seen by Primary Care Provider, Dr. Idalia Needle, on 05/09/21. At last visit with pharmacy on 05/02/2022, patient was initiated on CGM (Libre 3).   Today, patient arrives in good spirits and presents without any assistance.   Current diabetes medications include: insulin Tresiba (insulin degludec) 75 units daily, insulin Novolog (insulin aspart) 50 units during largest meal, Ozempic (semaglutide) 2 mg once weekly.  -On last visit (05/02/2022), held SGLT2-I Jardiance (empagliflozin) 10 mg with increased A1c  Current hypertension medications include: None  Current hyperlipidemia medications include: rosuvastatin (CRESTOR) 20 MG tablet  Patient reports adherence to taking all medications. -Patient reports taking insulin aspart (Novolog) differently. Takes 50 units BID with meal instead of 45 units BID with meals.  -Patient reports taking insulin degludec Evaristo Bury) differently. Takes 75 units daily instead of 70 units daily.   Do you feel that your medications are working for you? yes Have you been experiencing any side effects to the medications prescribed? no Do you have any problems obtaining medications due to transportation or finances? no Insurance coverage: Medicaid  Patient denies hypoglycemic events.  Patient denies nocturia (nighttime urination).  Patient denies neuropathy (nerve pain). Patient denies visual changes. Patient reports self foot exams.   O:   Review of Systems  All other systems reviewed and are negative.   Physical Exam Constitutional:      Appearance: Normal appearance.  Pulmonary:     Effort: Pulmonary effort is normal.  Neurological:     Mental Status: She is alert.  Psychiatric:        Mood and  Affect: Mood normal.        Behavior: Behavior normal.        Thought Content: Thought content normal.      CGM Download:  % Time CGM is active: 95% Average Glucose: 169 mg/dL Glucose Management Indicator: 7.4%  Glucose Variability: 27.8 (goal <36%) Time in Goal:  - Time in range 70-180: 61% - Time above range: 39% - Time below range: 0% Observed patterns: glucose control much improved compared to last visit on 05/02/2022   Lab Results  Component Value Date   HGBA1C 11.7 (A) 04/08/2022   There were no vitals filed for this visit.  Lipid Panel     Component Value Date/Time   CHOL 210 (H) 04/08/2022 1755   TRIG 99 04/08/2022 1755   HDL 52 04/08/2022 1755   CHOLHDL 4.0 04/08/2022 1755   CHOLHDL 4.6 05/19/2015 0933   VLDL 27 05/19/2015 0933   LDLCALC 140 (H) 04/08/2022 1755    Clinical Atherosclerotic Cardiovascular Disease (ASCVD): No  The 10-year ASCVD risk score (Arnett DK, et al., 2019) is: 8.3%   Values used to calculate the score:     Age: 56 years     Sex: Female     Is Non-Hispanic African American: Yes     Diabetic: Yes     Tobacco smoker: No     Systolic Blood Pressure: 135 mmHg     Is BP treated: No     HDL Cholesterol: 52 mg/dL     Total Cholesterol: 210 mg/dL   Patient is participating in a Managed Medicaid Plan:  Yes   A/P: Diabetes longstanding currently controlled with  GMI of 7.4% and average glucose of 169 mg/dL. Patient is able to verbalize appropriate hypoglycemia management plan. Medication adherence appears optimal.  -Not initiated on new therapies. Insulin dose continued at the dose the patient has been taking: insulin aspart (Novolog) adjusted to 50 units BID with meal from 45 units BID with meals and insulin degludec Evaristo Bury) 75 units daily from 70 units daily.  -Patient educated on strategies to avoid loss of CGM sensor. Patient provided and assisted with the application of one CGM sensor during the visit. Patient provided and counseled on  use of CGM sensor patch/adhesive. -Extensively discussed pathophysiology of diabetes, recommended lifestyle interventions, dietary effects on blood sugar control.  -Resumed and sent a prescription for empagliflozin (JARDIANCE) 10 MG once daily.  -Counseled on s/sx of and management of hypoglycemia.  -Next A1c anticipated in 2 months. Last A1c at 11.7% on 04/08/2022.  Consider ARB at next visit for renal benefit.   Written patient instructions provided. Patient verbalized understanding of treatment plan.  Total time in face to face counseling 38 minutes.    Follow-up:  Pharmacist 06/11/2022. PCP clinic visit in 05/24/2022 with Dr. Idalia Needle.  Patient seen with Bing Plume, PharmD Candidate.

## 2022-05-22 NOTE — Progress Notes (Signed)
Reviewed and agree with Dr Koval's plan.   

## 2022-05-24 ENCOUNTER — Ambulatory Visit: Payer: Medicaid Other | Admitting: Family Medicine

## 2022-05-24 ENCOUNTER — Telehealth: Payer: Self-pay

## 2022-05-24 NOTE — Telephone Encounter (Signed)
A Prior Authorization was initiated for this patients JARDIANCE through CoverMyMeds.   Key: [id number]

## 2022-05-25 NOTE — Telephone Encounter (Signed)
Prior Auth for patients medication JARDIANCE approved by [employer] from 05/24/22 to 05/24/23.  CoverMyMeds Key: [id number] PA Case ID #: [id number]

## 2022-06-11 ENCOUNTER — Ambulatory Visit (INDEPENDENT_AMBULATORY_CARE_PROVIDER_SITE_OTHER): Payer: Medicaid Other | Admitting: Pharmacist

## 2022-06-11 ENCOUNTER — Encounter: Payer: Self-pay | Admitting: Family Medicine

## 2022-06-11 ENCOUNTER — Ambulatory Visit (INDEPENDENT_AMBULATORY_CARE_PROVIDER_SITE_OTHER): Payer: Medicaid Other | Admitting: Family Medicine

## 2022-06-11 ENCOUNTER — Other Ambulatory Visit: Payer: Self-pay

## 2022-06-11 ENCOUNTER — Encounter: Payer: Self-pay | Admitting: Pharmacist

## 2022-06-11 VITALS — BP 145/84 | HR 87 | Ht 60.4 in | Wt 205.8 lb

## 2022-06-11 VITALS — BP 145/84 | HR 87 | Wt 205.0 lb

## 2022-06-11 DIAGNOSIS — E785 Hyperlipidemia, unspecified: Secondary | ICD-10-CM

## 2022-06-11 DIAGNOSIS — R030 Elevated blood-pressure reading, without diagnosis of hypertension: Secondary | ICD-10-CM | POA: Diagnosis not present

## 2022-06-11 DIAGNOSIS — Z794 Long term (current) use of insulin: Secondary | ICD-10-CM

## 2022-06-11 DIAGNOSIS — E119 Type 2 diabetes mellitus without complications: Secondary | ICD-10-CM

## 2022-06-11 NOTE — Assessment & Plan Note (Signed)
BP 145/84. Has had intermittently elevated readings in past visits. Recommend setting patient up with ambulatory BP monitoring at next pharmacy visit and if still elevated to consider starting low dose Arb or Amlodipine-Losartan combo pill.

## 2022-06-11 NOTE — Assessment & Plan Note (Addendum)
Reviewed CGM report with Dr. Raymondo Band. GMI 7.8% which is an improvement from visit 2 months ago. Continuing with current regimen:  Novolog 50 units BID (taking 45-50 units 1-2 times daily), Tresiba 76 units daily, Ozempic  2 mg weekly. Discussed some dietary recommendations and incorporating physical activity. Has pharmacy follow up scheduled next month.

## 2022-06-11 NOTE — Assessment & Plan Note (Signed)
Diabetes longstanding currently uncontrolled, but improving based on most recent LibreView report. Patient is able to verbalize appropriate hypoglycemia management plan. Medication adherence appears appropriate for her insulin and GLP-1 regimen, however she has not restarted Jardiance as instructed at previous visit. She has implemented various dietary changes in the past few months. Congratulated her on this. Control remains suboptimal due to inadequate insulin regimen however patient is also making significant food choice adjustments currently.  -Continued basal insulin Tresiba (insulin degludec) 76 units daily -Continued rapid insulin Novolog (insulin aspart) 50 units BID with meals  -Continued GLP-1 Ozempic (semaglutide) 2 mg weekly -Restarted SGLT2-I  Jardiance (empagliflozin) 10 mg. Instructed to pick this up from the pharmacy.  -Extensively discussed pathophysiology of diabetes, recommended lifestyle interventions, dietary effects on blood sugar control.  Emphasized need to continue to adapt diet to improve control.

## 2022-06-11 NOTE — Assessment & Plan Note (Signed)
Lipid panel 2 months ago with total cholesterol of 210, LDL 140. Currently on Rosuvastatin 20 mg daily and was not taking it before but states she has started it. Recommend checking again in a few months

## 2022-06-11 NOTE — Progress Notes (Signed)
    SUBJECTIVE:   CHIEF COMPLAINT / HPI:   Patient presents for follow-up. Was seen by Dr. Raymondo Band today for diabetes management. GMI from CGM was 7.8%, Keeping current insulin regimen the same.  Current regimin: Novolog (insulin aspart) 50 units BID (taking 45-50 units 1-2 times daily), Tresiba (insulin degludec) 76 units daily (taking 75 units daily), Ozempic (semaglutide) 2 mg weekly  Denies any concerns today. States she feels better with her sugars controlled   Feels blood pressure is elevated because she ate a whole bag of pork skins Has not been exercising, just walking during work.    PERTINENT  PMH / PSH: Reviewed   OBJECTIVE:   BP (!) 145/84   Pulse 87   Wt 205 lb (93 kg)   SpO2 100%   BMI 39.51 kg/m    Physical exam General: well appearing, NAD Cardiovascular: RRR, no murmurs Lungs: CTAB. Normal WOB Abdomen: soft, non-distended Skin: warm, dry. No edema  ASSESSMENT/PLAN:   Type 2 diabetes mellitus treated with insulin (HCC) Reviewed CGM report with Dr. Raymondo Band. GMI 7.8% which is an improvement from visit 2 months ago. Continuing with current regimen:  Novolog 50 units BID (taking 45-50 units 1-2 times daily), Tresiba 76 units daily, Ozempic  2 mg weekly. Discussed some dietary recommendations and incorporating physical activity. Has pharmacy follow up scheduled next month.  Elevated blood pressure reading BP 145/84. Has had intermittently elevated readings in past visits. Recommend setting patient up with ambulatory BP monitoring at next pharmacy visit and if still elevated to consider starting low dose Arb or Amlodipine-Losartan combo pill.  Hyperlipidemia Lipid panel 2 months ago with total cholesterol of 210, LDL 140. Currently on Rosuvastatin 20 mg daily and was not taking it before but states she has started it. Recommend checking again in a few months   Health maintenance Due for eye exam- patient will schedule Declines mammogram Declines Hep C screen. Will  add it on when getting blood work at follow up appt   Cora Collum, DO [facility]

## 2022-06-11 NOTE — Patient Instructions (Addendum)
It was nice to see you today!  Your goal blood sugar is 80-130 before eating and less than 180 after eating.  Medication Changes: Restart Jardiance 10 mg once daily  Continue Novolog 50 units BID with meals  Continue Ozempic 2 mg weekly  Continue Tresiba 76 units daily   Continue rosuvastatin 20 mg daily  Monitor blood sugars at home and keep a log (glucometer or piece of paper) to bring with you to your next visit.  Keep up the good work with diet and exercise. Aim for a diet full of vegetables, fruit and lean meats (chicken, [place], fish). Try to limit salt intake by eating fresh or frozen vegetables (instead of canned), rinse canned vegetables prior to cooking and do not add any additional salt to meals.

## 2022-06-11 NOTE — Progress Notes (Signed)
S:     Chief Complaint  Patient presents with   Medication Management    DM Management   52 y.o. female who presents for diabetes evaluation, education, and management.  PMH is significant for T2DM, HLD, GERD, and OSA.  Patient was referred and last seen by Primary Care Provider, Dr. Idalia Needle, on 04/12/2022.   At last visit with the pharmacy team, Novolog was increased to 50 units BID, Evaristo Bury was increased to 76 units daily, and Jardiance was resumed.  Today, patient arrives in good spirits and presents without any assistance.   Patient reports Diabetes was diagnosed around 2010.   Current diabetes medications include: Novolog (insulin aspart) 50 units BID (taking 45-50 units once daily), Tresiba (insulin degludec) 76 units daily, Ozempic (semaglutide) 2 mg weekly, Jardiance (empagliflozin) 10 mg daily (has not restarted) Current hyperlipidemia medications include: rosuvastatin 20 mg daily  Patient reports adherence to taking all medications as prescribed. Patient reports taking her rosuvastatin 1-2 times a week. She has not yet picked up her Jardiance from the pharmacy. She reports adherence to all her other medications.  Do you feel that your medications are working for you? yes Have you been experiencing any side effects to the medications prescribed? no Do you have any problems obtaining medications due to transportation or finances? no Insurance coverage: [place] Medicaid  Patient denies hypoglycemic events.  Patient denies nocturia (nighttime urination).  Patient reports neuropathy (nerve pain). Patient denies visual changes. Patient reports self foot exams.   Patient reported dietary habits: Eats 1-2 meals/day Dinner: chicken (grilled/fried) cobb salad  Snacks: chips,peanut butter crackers, cheese, Drinks: water, sweet tea (recently cut out sodas)  Patient-reported exercise habits: limited to work hours   O:   Review of Systems  All other systems reviewed and are  negative.   Physical Exam Pulmonary:     Effort: Pulmonary effort is normal.  Neurological:     Mental Status: She is alert.  Psychiatric:        Mood and Affect: Mood normal.        Behavior: Behavior normal.    7 day average blood glucose: 188  06/11/2022 CGM Download:  % Time CGM is active: 57% Average Glucose: 188 mg/dL Glucose Management Indicator: 7.8%  Glucose Variability: 27.6% (goal <36%) Time in Goal:  - Time in range 70-180: 45% - Time above range: 55% - Time below range: 0% Observed patterns:   Lab Results  Component Value Date   HGBA1C 11.7 (A) 04/08/2022   There were no vitals filed for this visit.  Lipid Panel     Component Value Date/Time   CHOL 210 (H) 04/08/2022 1755   TRIG 99 04/08/2022 1755   HDL 52 04/08/2022 1755   CHOLHDL 4.0 04/08/2022 1755   CHOLHDL 4.6 05/19/2015 0933   VLDL 27 05/19/2015 0933   LDLCALC 140 (H) 04/08/2022 1755    Clinical Atherosclerotic Cardiovascular Disease (ASCVD): No  The 10-year ASCVD risk score (Arnett DK, et al., 2019) is: 4.2%   Values used to calculate the score:     Age: 52 years     Sex: Female     Is Non-Hispanic African American: Yes     Diabetic: Yes     Tobacco smoker: No     Systolic Blood Pressure: 111 mmHg     Is BP treated: No     HDL Cholesterol: 52 mg/dL     Total Cholesterol: 210 mg/dL    A/P: Diabetes longstanding currently uncontrolled, but  improving based on most recent LibreView report. Patient is able to verbalize appropriate hypoglycemia management plan. Medication adherence appears appropriate for her insulin and GLP-1 regimen, however she has not restarted Jardiance as instructed at previous visit. She has implemented various dietary changes in the past few months. Congratulated her on this. Control remains suboptimal due to inadequate insulin regimen however patient is also making significant food choice adjustments currently.  -Continued basal insulin Tresiba (insulin degludec) 76  units daily -Continued rapid insulin Novolog (insulin aspart) 50 units BID with meals  -Continued GLP-1 Ozempic (semaglutide) 2 mg weekly -Restarted SGLT2-I  Jardiance (empagliflozin) 10 mg. Instructed to pick this up from the pharmacy.  -Extensively discussed pathophysiology of diabetes, recommended lifestyle interventions, dietary effects on blood sugar control.  Emphasized need to continue to adapt diet to improve control.  -Counseled on s/sx of and management of hypoglycemia.  -Next A1c anticipated 07/2022.   ASCVD risk - primary prevention in patient with diabetes. Last LDL is 140 not at goal of <16 mg/dL. ASCVD risk factors include diabetes and HLD. -Continued rosuvastatin 20 mg. Encouraged daily medication adherence  Written patient instructions provided. Patient verbalized understanding of treatment plan.  Total time in face to face counseling 34 minutes.    Follow-up:  Pharmacist on 07/18/22. Patient seen with Lily Peer, PharmD, PGY-1 resident and Frederic Jericho, PharmD Candidate.

## 2022-06-11 NOTE — Patient Instructions (Signed)
It was great seeing you today!  I'm glad your sugars are going down! I recommend making dietary changes including incorporating more vegetables, reducing carb and processed food intake.  Try to get some exercise in every week as well.   When you come in for your pharmacy visit next month they may connect you with a 24 hour blood pressure cuff to see how your blood pressure is at home and if still elevated you would benefit from starting a medication   Next time you come in I recommend doing blood work and we will recheck your cholesterol and the one time hepatitis C screen.   Please make sure you schedule your eye appointment   Please check-out at the front desk before leaving the clinic. Schedule to see your new PCP in about 2 months, but if you need to be seen earlier than that for any new issues we're happy to fit you in, just give [place] a call!  Feel free to call with any questions or concerns at any time, at [phone].   Take care,  Dr. Cora Collum [facility]

## 2022-06-13 NOTE — Progress Notes (Signed)
Reviewed and agree with Dr Koval's plan.   

## 2022-07-15 ENCOUNTER — Other Ambulatory Visit: Payer: Self-pay

## 2022-07-15 DIAGNOSIS — Z794 Long term (current) use of insulin: Secondary | ICD-10-CM

## 2022-07-15 DIAGNOSIS — E1140 Type 2 diabetes mellitus with diabetic neuropathy, unspecified: Secondary | ICD-10-CM

## 2022-07-15 NOTE — Telephone Encounter (Signed)
Patient calls nurse line regarding refills on Novolog and Tresiba. Per chart review, refills were approved on 05/21/22, however, were set to "no print"   Patient also reports that she needs Ozempic. This will need new refill. She has Medicaid now, this needs to be sent in by provider enrolled in Medicaid.   Forwarding to preceptor.   Veronda Prude, RN

## 2022-07-16 MED ORDER — OZEMPIC (2 MG/DOSE) 8 MG/3ML ~~LOC~~ SOPN: 2.0000 mg | PEN_INJECTOR | SUBCUTANEOUS | 1 refills | Status: DC

## 2022-07-16 MED ORDER — NOVOLOG FLEXPEN 100 UNIT/ML ~~LOC~~ SOPN
50.0000 [IU] | PEN_INJECTOR | Freq: Two times a day (BID) | SUBCUTANEOUS | 1 refills | Status: DC
Start: 2022-07-16 — End: 2022-08-01

## 2022-07-16 MED ORDER — TRESIBA FLEXTOUCH 200 UNIT/ML ~~LOC~~ SOPN
75.0000 [IU] | PEN_INJECTOR | Freq: Every day | SUBCUTANEOUS | 1 refills | Status: DC
Start: 2022-07-16 — End: 2022-08-01

## 2022-07-16 NOTE — Telephone Encounter (Signed)
[facility] appointment with [facility] before further refills  

## 2022-07-18 ENCOUNTER — Ambulatory Visit: Payer: Medicaid Other | Admitting: Pharmacist

## 2022-07-22 ENCOUNTER — Telehealth: Payer: Self-pay

## 2022-07-22 NOTE — Telephone Encounter (Signed)
Patient LVM on nurse line requesting returned call.   Called patient back. She is asking how accurate Freestyle sensor is, as for the last two days she has gotten low readings in the 50's. She states that she felt hot with the low readings with a slight headache.   Patient did not correlate readings with glucometer. She has only been taking [place] for the last two days, denies using Novolog. She states that she only takes Novolog if she has large meals. She administers tresiba in the morning between 7-8.   Current blood sugar is reading 221. She is currently asymptomatic. Offered to schedule patient appointment. Patient states that she has follow up with Dr. Raymondo Band next week on 7/23. Advised patient that if she received low reading again to correlate with glucometer and call our office back to discuss further management, as she may need to be seen sooner.   Red flags discussed. Patient verbalizes understanding.   Veronda Prude, RN

## 2022-07-23 ENCOUNTER — Ambulatory Visit (INDEPENDENT_AMBULATORY_CARE_PROVIDER_SITE_OTHER): Payer: Medicaid Other | Admitting: Student

## 2022-07-23 VITALS — BP 120/82 | HR 90 | Ht 60.0 in | Wt 203.4 lb

## 2022-07-23 DIAGNOSIS — E119 Type 2 diabetes mellitus without complications: Secondary | ICD-10-CM

## 2022-07-23 DIAGNOSIS — I10 Essential (primary) hypertension: Secondary | ICD-10-CM | POA: Diagnosis not present

## 2022-07-23 DIAGNOSIS — Z794 Long term (current) use of insulin: Secondary | ICD-10-CM

## 2022-07-23 DIAGNOSIS — E1140 Type 2 diabetes mellitus with diabetic neuropathy, unspecified: Secondary | ICD-10-CM

## 2022-07-23 DIAGNOSIS — R030 Elevated blood-pressure reading, without diagnosis of hypertension: Secondary | ICD-10-CM | POA: Diagnosis not present

## 2022-07-23 LAB — POCT GLYCOSYLATED HEMOGLOBIN (HGB A1C): Hemoglobin A1C: 8.7 % — AB (ref 4.0–5.6)

## 2022-07-23 NOTE — Telephone Encounter (Signed)
Patient returns call to nurse line requesting to schedule same day appointment.   She states that blood sugar levels continue to fluctuate and she would like to be evaluated.   Current blood sugar is in the 140's. She is asymptomatic at this time. Scheduled patient this afternoon at 3:10. ED precautions discussed for in the meantime.   Veronda Prude, RN

## 2022-07-23 NOTE — Progress Notes (Unsigned)
  SUBJECTIVE:   CHIEF COMPLAINT / HPI:   DM2  CBG Fluctuations: Current regimen per last note:  Novolog 50 units BID (taking 45-50 units 1-2 times daily), Tresiba 76 units daily, Ozempic 2 mg weekly. A1C 3 months ago 11.7.   Elevated BP reading: BP: 120/82 today. Home medications include: ***. She {Blank single:19197::"endorses","does not endorse"} taking these medications as prescribed. {Blank single:19197::"Does","Does not"} check blood pressure at home.*** Diet ***. Exercise ***. Most recent creatinine trend:  Lab Results  Component Value Date   CREATININE 0.51 (L) 04/08/2022   CREATININE 0.64 05/26/2019   CREATININE 0.78 05/25/2019   Patient has had a BMP in the past 1 year.  PERTINENT  PMH / PSH:   Patient Care Team: Baloch, Mahnoor Baloch, MD as PCP - General (Family Medicine) OBJECTIVE:  BP 120/82   Pulse 90   Ht 5' (1.524 m)   Wt 203 lb 6 oz (92.3 kg)   SpO2 96%   BMI 39.72 kg/m  Physical Exam  General: Alert and oriented in no apparent distress Heart: Regular rate and rhythm with no murmurs appreciated Lungs: CTA bilaterally, no wheezing Abdomen: Bowel sounds present, no abdominal pain Skin: Warm and dry Extremities: No lower extremity edema  ASSESSMENT/PLAN:  Type 2 diabetes mellitus treated with insulin (HCC) -     POCT glycosylated hemoglobin (Hb A1C)   No follow-ups on file. Alfredo Martinez, MD 07/23/2022, 3:24 PM PGY-3, [facility] {    This will disappear when note is signed, click to select method of visit    :1}

## 2022-07-23 NOTE — Patient Instructions (Addendum)
It was great to see you today! Thank you for choosing [facility] for your primary care. Margaret Larsen was seen for follow up.  Today we addressed: Decrease your Novolog to 30 U when you take it with big meals and schedule a follow up in 1 week with Dr. Raymondo Band. If you keep having lows, please call.   If you haven't already, sign up for My Chart to have easy access to your labs results, and communication with your primary care physician.  I recommend that you always bring your medications to each appointment as this makes it easy to ensure you are on the correct medications and helps [place] not miss refills when you need them. Call the clinic at [phone] if your symptoms worsen or you have any concerns.  You should return to our clinic Return in about 1 week (around 07/30/2022). Please arrive 15 minutes before your appointment to ensure smooth check in process.  We appreciate your efforts in making this happen.  Thank you for allowing me to participate in your care, Alfredo Martinez, MD 07/23/2022, 3:47 PM PGY-3, [facility]

## 2022-07-23 NOTE — Progress Notes (Unsigned)
  SUBJECTIVE:   CHIEF COMPLAINT / HPI:   DM2  CBG Fluctuations: Current regimen per last note:  Novolog 50 units BID (taking 45-50 units 1-2 times daily), Tresiba 76 units daily, Ozempic 2 mg weekly--used this last Wednesday. A1C 3 months ago 11.7.  Had a sugar of 55, Saturday night into Sunday the majority of the day, felt hot and had a headache. No LOC or syncopal episode. In the 100s before this last week.   CBGs 255 today but has not eaten much. Has not taken the Novolog as she only uses it until she eats a big meal.   Not taking the Jardiance anymore, causing her to void every 5-10 minutes. No dysuria or burning. Felt welps/round circles on her thighs as well.   Elevated BP reading: BP: 120/82 today. She {Blank single:19197::"endorses","does not endorse"} taking these medications as prescribed. {Blank single:19197::"Does","Does not"} check blood pressure at home.*** Diet ***. Exercise ***. Most recent creatinine trend:  Lab Results  Component Value Date   CREATININE 0.51 (L) 04/08/2022   CREATININE 0.64 05/26/2019   CREATININE 0.78 05/25/2019   Patient has had a BMP in the past 1 year.  PERTINENT  PMH / PSH:   Patient Care Team: Baloch, Mahnoor Baloch, MD as PCP - General (Family Medicine) OBJECTIVE:  BP 120/82   Pulse 90   Ht 5' (1.524 m)   Wt 203 lb 6 oz (92.3 kg)   SpO2 96%   BMI 39.72 kg/m  Physical Exam  General: Alert and oriented in no apparent distress Heart: Regular rate and rhythm with no murmurs appreciated Lungs: CTA bilaterally, no wheezing Abdomen: Bowel sounds present, no abdominal pain Skin: Warm and dry Extremities: No lower extremity edema  ASSESSMENT/PLAN:  Type 2 diabetes mellitus treated with insulin (HCC) -     POCT glycosylated hemoglobin (Hb A1C)   No follow-ups on file. Alfredo Martinez, MD 07/23/2022, 3:34 PM PGY-3, [facility] {    This will disappear when note is signed, click to select method of visit    :1}

## 2022-07-24 NOTE — Assessment & Plan Note (Signed)
BP: 120/82 today. Well controlled. Continue to work on healthy dietary habits and exercise.   Medication regimen: None

## 2022-07-24 NOTE — Assessment & Plan Note (Signed)
Continue monitoring.

## 2022-07-24 NOTE — Assessment & Plan Note (Signed)
well controlled - Last A1c:  Lab Results  Component Value Date   HGBA1C 8.7 (A) 07/23/2022   - Medications: Decrease Novolog to 30 units 1-2 times daily or with a meal. Continue ozempic, stopped Jardiance - Follow up with Dr. Raymondo Band

## 2022-07-29 NOTE — Telephone Encounter (Signed)
Attempted to contact patient for follow-up of glucose - CGM report and symptoms.    Left HIPAA compliant voice mail requesting call back to direct phone: [phone]  Total time with patient call and documentation of interaction: 8 minutes.

## 2022-07-30 ENCOUNTER — Ambulatory Visit: Payer: Medicaid Other | Admitting: Pharmacist

## 2022-08-01 ENCOUNTER — Encounter: Payer: Self-pay | Admitting: Pharmacist

## 2022-08-01 ENCOUNTER — Ambulatory Visit (INDEPENDENT_AMBULATORY_CARE_PROVIDER_SITE_OTHER): Payer: Medicaid Other | Admitting: Pharmacist

## 2022-08-01 VITALS — BP 133/74 | HR 81 | Wt 208.0 lb

## 2022-08-01 DIAGNOSIS — E119 Type 2 diabetes mellitus without complications: Secondary | ICD-10-CM

## 2022-08-01 DIAGNOSIS — Z794 Long term (current) use of insulin: Secondary | ICD-10-CM

## 2022-08-01 DIAGNOSIS — E1140 Type 2 diabetes mellitus with diabetic neuropathy, unspecified: Secondary | ICD-10-CM | POA: Diagnosis not present

## 2022-08-01 MED ORDER — TRESIBA FLEXTOUCH 200 UNIT/ML ~~LOC~~ SOPN
65.0000 [IU] | PEN_INJECTOR | Freq: Every day | SUBCUTANEOUS | 1 refills | Status: DC
Start: 2022-08-01 — End: 2022-09-06

## 2022-08-01 MED ORDER — OZEMPIC (2 MG/DOSE) 8 MG/3ML ~~LOC~~ SOPN
2.0000 mg | PEN_INJECTOR | SUBCUTANEOUS | 1 refills | Status: DC
Start: 2022-08-01 — End: 2022-09-24

## 2022-08-01 MED ORDER — NOVOLOG FLEXPEN 100 UNIT/ML ~~LOC~~ SOPN
30.0000 [IU] | PEN_INJECTOR | Freq: Two times a day (BID) | SUBCUTANEOUS | 1 refills | Status: DC
Start: 2022-08-01 — End: 2022-11-28

## 2022-08-01 NOTE — Assessment & Plan Note (Signed)
Diabetes longstanding currently improved control however episodes of hypoglycemia are concerning. Intermittent low readings and nocturnal lows are obvious on CGM report.    -Decreased basal insulin Tresiba (insulin degludec) from 75 to 65 units daily in the AM -Decreased rapid insulin Novolog (insulin aspart) from 45 units prior to largest meal of the day to 40 unless the large meal is later than 10:00 PM then 30 units  -Continued GLP-1 Ozempic (semaglutide) 2 mg weekly -STOPPED SGLT2-I  Jardiance (empagliflozin) 10 mg due to allergy reported. Hives and itching.  Added to allergy list.   -Extensively discussed pathophysiology of diabetes, recommended lifestyle interventions, dietary effects on blood sugar control.  Emphasized need to continue to adapt diet to improve control.

## 2022-08-01 NOTE — Progress Notes (Signed)
S:     Chief Complaint  Patient presents with   Medication Management    Diabetes - CGM - Insulin adjustment   52 y.o. female who presents for diabetes evaluation, education, and management.  PMH is significant for T2DM, HLD, GERD, and OSA.  Patient was referred and last seen by Primary Care Provider, Dr. Jena Gauss, on 07/23/2022.    At most recent visit with PCP, insulin doses were adjusted to minimize frequency of low glucose readings.    Today, patient arrives in good spirits and presents without any assistance.    Patient reports Diabetes was diagnosed around 2010.    Current diabetes medications include: Novolog (insulin aspart) 45 units daily with largest meall - rarely takes second dose, Tresiba (insulin degludec) 75 units daily in the AM, Ozempic (semaglutide) 2 mg weekly, Jardiance (empagliflozin) 10 mg daily (was used recently for three days and patient reported hives and itching.  Current hyperlipidemia medications include: rosuvastatin 20 mg daily  Patient reports adherence to taking all medications as prescribed.   Do you feel that your medications are working for you? yes Have you been experiencing any side effects to the medications prescribed? no Do you have any problems obtaining medications due to transportation or finances? no Insurance coverage: [place] Medicaid   Patient reports intermittent hypoglycemic events.   Patient reported dietary habits: Eats 1 main large meal per day    Patient-reported exercise habits: limited to work hours    O:   Review of Systems  All other systems reviewed and are negative.   Physical Exam Constitutional:      Appearance: Normal appearance.  Pulmonary:     Effort: Pulmonary effort is normal.  Neurological:     Mental Status: She is alert.  Psychiatric:        Mood and Affect: Mood normal.        Behavior: Behavior normal.        Thought Content: Thought content normal.        Judgment: Judgment normal.       Libre 3 CGM Download: through 07/29/2022 % Time CGM is active: 91% Average Glucose: 170 mg/dL Glucose Management Indicator: 7.4  Glucose Variability: 34.2% (goal <36%) Time in Goal:  - Time in range 70-180: 57% - Time above range: 42% - Time below range: 1% Observed patterns: late night meals appear to happen multiple times per week.    Lab Results  Component Value Date   HGBA1C 8.7 (A) 07/23/2022   Vitals:   08/01/22 1621  BP: 133/74  Pulse: 81  SpO2: 96%    Lipid Panel     Component Value Date/Time   CHOL 210 (H) 04/08/2022 1755   TRIG 99 04/08/2022 1755   HDL 52 04/08/2022 1755   CHOLHDL 4.0 04/08/2022 1755   CHOLHDL 4.6 05/19/2015 0933   VLDL 27 05/19/2015 0933   LDLCALC 140 (H) 04/08/2022 1755   Patient is participating in a Managed Medicaid Plan:  Yes   A/P: Diabetes longstanding currently improved control however episodes of hypoglycemia are concerning. Intermittent low readings and nocturnal lows are obvious on CGM report.    -Decreased basal insulin Tresiba (insulin degludec) from 75 to 65 units daily in the AM -Decreased rapid insulin Novolog (insulin aspart) from 45 units prior to largest meal of the day to 40 unless the large meal is later than 10:00 PM then 30 units  -Continued GLP-1 Ozempic (semaglutide) 2 mg weekly -STOPPED SGLT2-I  Jardiance (empagliflozin) 10 mg  due to allergy reported. Hives and itching.  Added to allergy list.   -Extensively discussed pathophysiology of diabetes, recommended lifestyle interventions, dietary effects on blood sugar control.  Emphasized need to continue to adapt diet to improve control.  -Counseled on s/sx of and management of hypoglycemia.  -Next A1c anticipated 07/2022.   Written patient instructions provided. Patient verbalized understanding of treatment plan.  Total time in face to face counseling 29 minutes.    Follow-up:  Pharmacist 7-8 weeks. PCP clinic visit in 2 weeks with new PCP.

## 2022-08-01 NOTE — Patient Instructions (Signed)
It was nice to see you today!  Your goal blood sugar is 80-130 before eating and less than 180 after eating.  Medication Changes: Continue Ozempic 2mg  weekly  Decrease Tresiba (morning insulin) to 65 units once daily.   Adjust your Novolog to 40 units prior to largest meal of the day.  If you largest meal is later than 10:00PM - use 30 units   Keep up the good work with diet and exercise. Aim for a diet full of vegetables, fruit and lean meats (chicken, [place], fish). Try to limit salt intake by eating fresh or frozen vegetables (instead of canned), rinse canned vegetables prior to cooking and do not add any additional salt to meals.

## 2022-08-02 NOTE — Progress Notes (Signed)
Reviewed and agree with Dr Koval's plan.   

## 2022-08-15 ENCOUNTER — Ambulatory Visit: Payer: Medicaid Other | Admitting: Family Medicine

## 2022-09-05 ENCOUNTER — Telehealth: Payer: Self-pay

## 2022-09-05 DIAGNOSIS — E119 Type 2 diabetes mellitus without complications: Secondary | ICD-10-CM

## 2022-09-05 NOTE — Telephone Encounter (Signed)
Patient calls nurse line reporting difficulty picking up [place].   She reports the pharmacy told her the medication would need a PA.   I called the pharmacy and confirmed.   Will attempt PA.

## 2022-09-06 MED ORDER — LANTUS SOLOSTAR 100 UNIT/ML ~~LOC~~ SOPN: 70.0000 [IU] | PEN_INJECTOR | Freq: Every morning | SUBCUTANEOUS | 99 refills | Status: DC

## 2022-09-06 NOTE — Assessment & Plan Note (Signed)
Patient contacted for follow-up of Need to switch long-acting insulin per insurance  Since last contact patient reports she has run out of [place] and now feels unwell.   Current Medications include: [place] and Novolog Patient denies any significant medication related side effects.  Medication Plan: -change Tresiba to Lantus Dose 70 units once daily in the AM  New prescription provided.

## 2022-09-06 NOTE — Telephone Encounter (Signed)
Patient contacted for follow-up of Need to switch long-acting insulin per insurance  Since last contact patient reports she has run out of [place] and now feels unwell.   Current Medications include: [place] and Novolog Patient denies any significant medication related side effects.  Medication Plan: - change  Tresiba to Lantus Dose 70 units once daily in the AM  New prescription provided.   She has been on very large doses of Lantus in the past and has been under better control with Evaristo Bury unfortunately it is not covered on her insurance.  Plan to follow-up in 14 days to review CGM and control with Lantus exchange.   Total time with patient call and documentation of interaction: 16 minutes.  F/U Phone call planned: None

## 2022-09-06 NOTE — Telephone Encounter (Signed)
No PA rec'd yet, can initiate if needed!  Per insurance, medicaid prefers Lantus Solostar, insuline glargine solostar or vial, & Levemir.   Would you like to change the medication?

## 2022-09-06 NOTE — Telephone Encounter (Signed)
Reviewed and agree with Dr Koval's plan.   

## 2022-09-10 ENCOUNTER — Telehealth: Payer: Self-pay

## 2022-09-10 ENCOUNTER — Other Ambulatory Visit (HOSPITAL_COMMUNITY): Payer: Self-pay

## 2022-09-10 DIAGNOSIS — Z794 Long term (current) use of insulin: Secondary | ICD-10-CM

## 2022-09-10 NOTE — Telephone Encounter (Signed)
Pharmacy Patient Advocate Encounter   Received notification from Pt Calls Messages that prior authorization for TRESIBA is required/requested.   Insurance verification completed.   The patient is insured through [facility] MEDICAID .   Per test claim: PA required; PA submitted to [facility] MEDICAID via CoverMyMeds Key/confirmation #/EOC [id number]. Status is pending

## 2022-09-10 NOTE — Telephone Encounter (Signed)
Pharmacy Patient Advocate Encounter  Received notification from [facility] MEDICAID that Prior Authorization for TRESIBA has been APPROVED from 09/10/22 to 09/10/23

## 2022-09-12 MED ORDER — TRESIBA FLEXTOUCH 200 UNIT/ML ~~LOC~~ SOPN
70.0000 [IU] | PEN_INJECTOR | Freq: Every day | SUBCUTANEOUS | 3 refills | Status: DC
Start: 2022-09-12 — End: 2022-11-28

## 2022-09-12 NOTE — Assessment & Plan Note (Signed)
Patient contacted for follow-up of Tresiba (insulin degludec) continuation in place of switch to Lantus (insulin glargine) as requested by Medicaid.   Since last contact patient reports that she has had a variety of symptoms from using the Lantus.  She was happy to hear that her Evaristo Bury override had been approved. I asked her to contact her pharmacy, pick-up [place] and restart in place of Lantus at her earliest convenience. I also asked her to return a call to me if she had any issues with the transition back to [place].

## 2022-09-12 NOTE — Telephone Encounter (Signed)
Patient contacted for follow-up of Tresiba (insulin degludec) continuation in place of switch to Lantus (insulin glargine) as requested by Medicaid.   Since last contact patient reports that she has had a variety of symptoms from using the Lantus.  She was happy to hear that her Evaristo Bury override had been approved. I asked her to contact her pharmacy, pick-up [place] and restart in place of Lantus at her earliest convenience. I also asked her to return a call to me if she had any issues with the transition back to [place].   Total time with patient call and documentation of interaction: 17 minutes.  F/U Phone call planned: None

## 2022-09-12 NOTE — Addendum Note (Signed)
Addended by: Kathrin Ruddy on: 09/12/2022 01:56 PM   Modules accepted: Orders

## 2022-09-13 NOTE — Telephone Encounter (Signed)
Reviewed and agree with Dr Koval's plan.   

## 2022-09-19 ENCOUNTER — Ambulatory Visit: Payer: Medicaid Other | Admitting: Family Medicine

## 2022-09-24 ENCOUNTER — Ambulatory Visit (INDEPENDENT_AMBULATORY_CARE_PROVIDER_SITE_OTHER): Payer: Medicaid Other | Admitting: Pharmacist

## 2022-09-24 ENCOUNTER — Encounter: Payer: Self-pay | Admitting: Pharmacist

## 2022-09-24 VITALS — BP 132/82 | HR 87 | Ht <= 58 in | Wt 208.4 lb

## 2022-09-24 DIAGNOSIS — E785 Hyperlipidemia, unspecified: Secondary | ICD-10-CM

## 2022-09-24 DIAGNOSIS — Z794 Long term (current) use of insulin: Secondary | ICD-10-CM

## 2022-09-24 DIAGNOSIS — E119 Type 2 diabetes mellitus without complications: Secondary | ICD-10-CM | POA: Diagnosis not present

## 2022-09-24 DIAGNOSIS — E1140 Type 2 diabetes mellitus with diabetic neuropathy, unspecified: Secondary | ICD-10-CM

## 2022-09-24 MED ORDER — ATORVASTATIN CALCIUM 40 MG PO TABS
40.0000 mg | ORAL_TABLET | Freq: Every day | ORAL | 3 refills | Status: DC
Start: 2022-09-24 — End: 2022-11-28

## 2022-09-24 MED ORDER — OZEMPIC (2 MG/DOSE) 8 MG/3ML ~~LOC~~ SOPN
2.0000 mg | PEN_INJECTOR | SUBCUTANEOUS | 2 refills | Status: DC
Start: 2022-09-24 — End: 2022-11-28

## 2022-09-24 NOTE — Assessment & Plan Note (Signed)
ASCVD risk - primary prevention in patient with diabetes. Last LDL is 140 not at goal of <11 mg/dL. ASCVD risk factors include T2DM and 10-year ASCVD risk score of 8.5%. high intensity statin indicated. Control is suboptimal due to not taking rosuvastatin due to nausea intolerance. Will switch to a different statin for better LDL management. Instructed to call us if unable to tolerate new agent. -Started atorvastatin 40 mg daily.

## 2022-09-24 NOTE — Progress Notes (Signed)
S:     Chief Complaint  Patient presents with   Medication Management    Diabetes   52 y.o. female who presents for diabetes evaluation, education, and management. Patient arrives in good spirits and presents without any assistance.  Patient was referred and last seen by Primary Care Provider, Dr. Idalia Needle, on 06/11/2022.   Last seen by pharmacy on 08/01/2022. At last visit, decreased Tresiba (insulin degludec) from 75 to 65 units daily in the AM. Decreased Novolog (insulin aspart) from 45 units prior to largest meal of the day to 40 unless the large meal is later than 10:00 PM then 30 units. Also, stopped Jardiance (empagliflozin) 10 mg due to patient experiencing hives and itching.  PMH is significant for T2DM, HLD, GERD, OSA.   Patient reports Diabetes was diagnosed in 2010.   Current diabetes medications include: Tresiba (insulin glargine) 70 units daily, Novolog (insulin aspart) 40-45 units daily, Ozempic (semaglutide) 2 mg weekly Current hyperlipidemia medications include: rosuvastatin 20 mg daily (unable to tolerate due to nausea - added to allergies)  Patient reports adherence with diabetes medications.  Patient reports hypoglycemic events (one shown on [facility] report).  Patient denies nocturia (nighttime urination).  Patient denies neuropathy (nerve pain). Patient denies visual changes. Patient reports self foot exams.   Patient reported dietary habits: Eats 2 meals/day  O:   Review of Systems  All other systems reviewed and are negative.  Physical Exam Constitutional:      Appearance: Normal appearance.  Neurological:     Mental Status: She is alert.  Psychiatric:        Mood and Affect: Mood normal.        Behavior: Behavior normal.        Thought Content: Thought content normal.        Judgment: Judgment normal.   Libre3 CGM Download today September 2 - September 15 % Time CGM is active: 97% Average Glucose: 196 mg/dL Glucose Management Indicator: 8.0%   Glucose Variability: 25.6%% (goal <36%) Time in Goal:  - Time in range 70-180: 42% - Time above range: 58% (16 % > 250 mg/dL) - Time below range: 0% Observed patterns: after evening meal/snacks, blood sugars are consistently elevated into the morning    Lab Results  Component Value Date   HGBA1C 8.7 (A) 07/23/2022   Vitals:   09/24/22 1504  BP: 132/82  Pulse: 87  SpO2: 100%    Lipid Panel     Component Value Date/Time   CHOL 210 (H) 04/08/2022 1755   TRIG 99 04/08/2022 1755   HDL 52 04/08/2022 1755   CHOLHDL 4.0 04/08/2022 1755   CHOLHDL 4.6 05/19/2015 0933   VLDL 27 05/19/2015 0933   LDLCALC 140 (H) 04/08/2022 1755    Clinical Atherosclerotic Cardiovascular Disease (ASCVD): No  The 10-year ASCVD risk score (Arnett DK, et al., 2019) is: 8.5%   Values used to calculate the score:     Age: 46 years     Sex: Female     Is Non-Hispanic African American: Yes     Diabetic: Yes     Tobacco smoker: No     Systolic Blood Pressure: 132 mmHg     Is BP treated: No     HDL Cholesterol: 52 mg/dL     Total Cholesterol: 210 mg/dL   Patient is participating in a Managed Medicaid Plan:  Yes   A/P: Diabetes longstanding currently uncontrolled. Patient is able to verbalize appropriate hypoglycemia management plan. Medication adherence appears  optimal. Control is suboptimal due to dietary indiscretion. -Continued basal insulin Tresiba (insulin degludec) 70 units daily. -Increased dose of rapid insulin Novolog (insulin aspart) from 40-45 with meals to 45 units with lunch and 50 units with dinner. -Continued GLP-1 Ozempic (semaglutide) 2 mg weekly. Sent refill to pharmacy. -Patient educated on purpose, proper use, and potential adverse effects.  -Extensively discussed pathophysiology of diabetes, recommended lifestyle interventions, dietary effects on blood sugar control.  -Counseled on s/sx of and management of hypoglycemia.  -Next A1c anticipated October 2024.   ASCVD risk -  primary prevention in patient with diabetes. Last LDL is 140 not at goal of <95 mg/dL. ASCVD risk factors include T2DM and 10-year ASCVD risk score of 8.5%. high intensity statin indicated. Control is suboptimal due to not taking rosuvastatin due to nausea intolerance. Will switch to a different statin for better LDL management. Instructed to call us if unable to tolerate new agent. -Started atorvastatin 40 mg daily.   Written patient instructions provided. Patient verbalized understanding of treatment plan.  Total time in face to face counseling 27 minutes.    Follow-up:  Pharmacist 10/29/2022 PCP clinic visit in [place] Patient seen with Alesia Banda, PharmD Candidate and Andee Poles, PharmD Candidate.

## 2022-09-24 NOTE — Assessment & Plan Note (Signed)
Diabetes longstanding currently uncontrolled. Patient is able to verbalize appropriate hypoglycemia management plan. Medication adherence appears optimal. Control is suboptimal due to dietary indiscretion. -Continued basal insulin Tresiba (insulin degludec) 70 units daily. -Increased dose of rapid insulin Novolog (insulin aspart) from 40-45 with meals to 45 units with lunch and 50 units with dinner. -Continued GLP-1 Ozempic (semaglutide) 2 mg weekly. Sent refill to pharmacy. -Patient educated on purpose, proper use, and potential adverse effects.  -Extensively discussed pathophysiology of diabetes, recommended lifestyle interventions, dietary effects on blood sugar control.  -Counseled on s/sx of and management of hypoglycemia.  -Next A1c anticipated October 2024.

## 2022-09-24 NOTE — Patient Instructions (Signed)
It was nice to see you today!  Your goal blood sugar is 80-130 before eating and less than 180 after eating.  Medication Changes: START Atorvastatin 40 mg daily (sent prescription)  Increase Novolog (insulin aspart) to 45 units with lunch and 50 units with evening meal.  Pick up new refill of Ozempic (semaglutide) 2 mg weekly.  Continue all other medication the same.   Keep up the good work with diet and exercise. Aim for a diet full of vegetables, fruit and lean meats (chicken, [place], fish). Try to limit salt intake by eating fresh or frozen vegetables (instead of canned), rinse canned vegetables prior to cooking and do not add any additional salt to meals.

## 2022-09-26 NOTE — Progress Notes (Signed)
Reviewed and agree with Dr Koval's plan.   

## 2022-10-29 ENCOUNTER — Ambulatory Visit: Payer: Medicaid Other | Admitting: Pharmacist

## 2022-11-09 ENCOUNTER — Encounter (HOSPITAL_COMMUNITY): Payer: Self-pay

## 2022-11-09 ENCOUNTER — Ambulatory Visit (HOSPITAL_COMMUNITY)
Admission: EM | Admit: 2022-11-09 | Discharge: 2022-11-09 | Disposition: A | Discharge status: Home / Self Care | Payer: Medicaid Other | Attending: Emergency Medicine | Admitting: Emergency Medicine

## 2022-11-09 DIAGNOSIS — B349 Viral infection, unspecified: Secondary | ICD-10-CM | POA: Diagnosis not present

## 2022-11-09 LAB — POC COVID19/FLU A&B COMBO
Covid Antigen, POC: NEGATIVE
Influenza A Antigen, POC: NEGATIVE
Influenza B Antigen, POC: NEGATIVE

## 2022-11-09 MED ORDER — ONDANSETRON 4 MG PO TBDP: 4.0000 mg | ORAL_TABLET | Freq: Four times a day (QID) | ORAL | 0 refills | Status: DC | PRN

## 2022-11-09 MED ORDER — LIDOCAINE VISCOUS HCL 2 % MT SOLN: 15.0000 mL | OROMUCOSAL | 0 refills | Status: DC | PRN

## 2022-11-09 NOTE — Discharge Instructions (Addendum)
Ibuprofen alternated with tylenol for sore throat, body aches, etc Make sure you are drinking lots of fluids! Try the lidocaine gargle and spit as often as needed to numb the throat   I have also sent some nausea medicine to use if needed  We will call you if covid or flu test is positive. If both negative, you have a different viral illness that will take some time to clear.

## 2022-11-09 NOTE — ED Triage Notes (Signed)
Patient reports that she began having a sore throat 2 days ago and then yesterday she had a non productive cough, fever, chills, V/D.  Patient states she took Advil and the last dose was yesterday.

## 2022-11-09 NOTE — ED Provider Notes (Signed)
[facility]    CSN: [id number] Arrival date & time: 11/09/22  1004     History   Chief Complaint Chief Complaint  Patient presents with   Sore Throat   Emesis   Diarrhea   Cough   Fever    HPI Margaret Larsen is a 52 y.o. female.  2 day history of sore throat, dry cough, chills Yesterday developed nausea with vomiting. Resolved on its own. No emesis today. Tolerating fluids.  Subjective fever but not measured Took advil, last dose yesterday. No other meds   Possible sick contacts at work  Reports feels similar to when she's had covid   Past Medical History:  Diagnosis Date   Cough 05/13/2016   Diabetes mellitus    Hemorrhoids 07/10/2010   Hypertension    Kidney stones    Kidney stones    Myalgia and myositis 06/19/2015   Renal disorder     Patient Active Problem List   Diagnosis Date Noted   Elevated blood pressure reading 06/11/2022   Cervical cancer screening 04/14/2022   Type 2 diabetes mellitus with diabetic neuropathy, unspecified (HCC) 05/13/2021   COVID-19 05/20/2019   Muscle fatigue 05/18/2019   Cough 05/13/2016   Abscess of gluteal region 04/19/2016   Panic attack 02/06/2016   Allergic rhinitis 04/25/2015   Hyperlipidemia 11/03/2014   Ventral hernia 10/25/2014   Atypical squamous cells of undetermined significance (ASCUS) on Papanicolaou smear of cervix 03/22/2013   At risk for healthcare associated infection 03/15/2013   GERD (gastroesophageal reflux disease) 11/13/2012   OSA (obstructive sleep apnea) 10/18/2010   Diarrhea 02/13/2010   Diabetic peripheral neuropathy (HCC) 01/23/2010   PERSONAL HISTORY OF ALLERGY TO PEANUTS 11/02/2009   PTSD 07/31/2009   Type 2 diabetes mellitus treated with insulin (HCC) 07/29/2008   HYPERTENSION, BENIGN ESSENTIAL 11/12/2007   DYSLIPIDEMIA 10/02/2007   Bipolar disorder (HCC) 08/11/2007   OBESITY, NOS 03/06/2006    Past Surgical History:  Procedure Laterality Date   CESAREAN SECTION      CHOLECYSTECTOMY     fiborids removed     TUBAL LIGATION      OB History   No obstetric history on file.      Home Medications    Prior to Admission medications   Medication Sig Start Date End Date Taking? Authorizing Provider  lidocaine (XYLOCAINE) 2 % solution Use as directed 15 mLs in the mouth or throat every 3 (three) hours as needed for mouth pain. Swish/gargle and spit out 11/09/22  Yes Aalaysia Liggins, Lurena Joiner, PA-C  ondansetron (ZOFRAN-ODT) 4 MG disintegrating tablet Take 1 tablet (4 mg total) by mouth every 6 (six) hours as needed for nausea or vomiting. 11/09/22  Yes Annaliz Aven, Ray Church  aspirin 81 MG EC tablet Take 81 mg by mouth 3 (three) times a week. Reported on 05/19/2015    [provider]  atorvastatin (LIPITOR) 40 MG tablet Take 1 tablet (40 mg total) by mouth daily. 09/24/22   McDiarmid, Leighton Roach, MD  cetirizine (ZYRTEC) 10 MG tablet Take 1 tablet (10 mg total) by mouth daily. 07/13/18   Allayne Stack, DO  Continuous Glucose Sensor (FREESTYLE LIBRE 3 SENSOR) MISC Place 1 sensor on the skin every 14 days. Use to check glucose continuously 05/02/22   McDiarmid, Leighton Roach, MD  fluticasone (FLONASE) 50 MCG/ACT nasal spray Place 2 sprays into both nostrils daily. 05/06/22   McDiarmid, Leighton Roach, MD  insulin aspart (NOVOLOG FLEXPEN) 100 UNIT/ML FlexPen Inject 30-40 Units into the skin 2 (  two) times daily with a meal. 08/01/22   McDiarmid, Leighton Roach, MD  insulin degludec (TRESIBA FLEXTOUCH) 200 UNIT/ML FlexTouch Pen Inject 70 Units into the skin daily. 09/12/22   McDiarmid, Leighton Roach, MD  Semaglutide, 2 MG/DOSE, (OZEMPIC, 2 MG/DOSE,) 8 MG/3ML SOPN Inject 2 mg into the skin once a week. 09/24/22   McDiarmid, Leighton Roach, MD    Family History Family History  Problem Relation Age of Onset   Hypertension Mother    Diabetes Mother    Cancer Mother     Social History Social History   Tobacco Use   Smoking status: Never   Smokeless tobacco: Never  Vaping Use   Vaping status: Never Used   Substance Use Topics   Alcohol use: No   Drug use: No     Allergies   Metformin and related, Peanut-containing drug products, Insulin glargine, Jardiance [empagliflozin], Rosuvastatin calcium, Codeine, Diphenhydramine hcl, Fexofenadine-pseudoephed er, Latuda [lurasidone hcl], and Shellfish allergy   Review of Systems Review of Systems Per HPI  Physical Exam Triage Vital Signs ED Triage Vitals  Encounter Vitals Group     BP      Systolic BP Percentile      Diastolic BP Percentile      Pulse      Resp      Temp      Temp src      SpO2      Weight      Height      Head Circumference      Peak Flow      Pain Score      Pain Loc      Pain Education      Exclude from Growth Chart    No data found.  Updated Vital Signs BP 133/89 (BP Location: Left Arm)   Pulse 80   Temp 98.3 F (36.8 C) (Oral)   Resp 16   SpO2 96%    Physical Exam Vitals and nursing note reviewed.  Constitutional:      General: She is not in acute distress.    Appearance: She is not ill-appearing.  HENT:     Right Ear: Tympanic membrane and ear canal normal.     Left Ear: Tympanic membrane and ear canal normal.     Nose: No congestion or rhinorrhea.     Mouth/Throat:     Mouth: Mucous membranes are moist.     Pharynx: Oropharynx is clear. No oropharyngeal exudate or posterior oropharyngeal erythema.     Comments: No tonsils visualized  Eyes:     Conjunctiva/sclera: Conjunctivae normal.  Cardiovascular:     Rate and Rhythm: Normal rate and regular rhythm.     Heart sounds: Normal heart sounds.  Pulmonary:     Effort: Pulmonary effort is normal.     Breath sounds: Normal breath sounds.  Abdominal:     Tenderness: There is no abdominal tenderness.  Musculoskeletal:     Cervical back: Normal range of motion.  Lymphadenopathy:     Cervical: No cervical adenopathy.  Skin:    General: Skin is warm and dry.  Neurological:     Mental Status: She is alert and oriented to person, place, and  time.     UC Treatments / Results  Labs (all labs ordered are listed, but only abnormal results are displayed) Labs Reviewed  POC COVID19/FLU A&B COMBO    EKG  Radiology No results found.  Procedures Procedures   Medications Ordered in UC Medications -  No data to display  Initial Impression / Assessment and Plan / UC Course  I have reviewed the triage vital signs and the nursing notes.  Pertinent labs & imaging results that were available during my care of the patient were reviewed by me and considered in my medical decision making (see chart for details).  Afebrile in clinic. No red flags. Offered strep test given patient 8/10 sore throat, patient declines, reasonable given well appearing throat, lack of fever, and presence of cough.  POC covid/flu negative  Discussed viral etiology, symptomatic care. Return precautions discussed. Patient agrees to plan. Work note provided  Final Clinical Impressions(s) / UC Diagnoses   Final diagnoses:  Viral illness     Discharge Instructions      Ibuprofen alternated with tylenol for sore throat, body aches, etc Make sure you are drinking lots of fluids! Try the lidocaine gargle and spit as often as needed to numb the throat   I have also sent some nausea medicine to use if needed  We will call you if covid or flu test is positive. If both negative, you have a different viral illness that will take some time to clear.      ED Prescriptions     Medication Sig Dispense Auth. Provider   lidocaine (XYLOCAINE) 2 % solution Use as directed 15 mLs in the mouth or throat every 3 (three) hours as needed for mouth pain. Swish/gargle and spit out 100 mL Morio Widen, PA-C   ondansetron (ZOFRAN-ODT) 4 MG disintegrating tablet Take 1 tablet (4 mg total) by mouth every 6 (six) hours as needed for nausea or vomiting. 20 tablet Sumner Boesch, Lurena Joiner, PA-C      PDMP not reviewed this encounter.   Marlow Baars, [place] 11/09/22 1144

## 2022-11-14 ENCOUNTER — Other Ambulatory Visit: Payer: Self-pay

## 2022-11-14 ENCOUNTER — Telehealth: Payer: Self-pay | Admitting: Pharmacist

## 2022-11-14 ENCOUNTER — Ambulatory Visit: Payer: Medicaid Other | Admitting: Pharmacist

## 2022-11-14 ENCOUNTER — Emergency Department (HOSPITAL_COMMUNITY): Payer: Medicaid Other

## 2022-11-14 ENCOUNTER — Encounter (HOSPITAL_COMMUNITY): Payer: Self-pay

## 2022-11-14 ENCOUNTER — Emergency Department (HOSPITAL_COMMUNITY)
Admission: EM | Admit: 2022-11-14 | Discharge: 2022-11-14 | Disposition: A | Discharge status: Home / Self Care | Payer: Medicaid Other | Attending: Emergency Medicine | Admitting: Emergency Medicine

## 2022-11-14 DIAGNOSIS — Z794 Long term (current) use of insulin: Secondary | ICD-10-CM | POA: Diagnosis not present

## 2022-11-14 DIAGNOSIS — R059 Cough, unspecified: Secondary | ICD-10-CM | POA: Diagnosis present

## 2022-11-14 DIAGNOSIS — I10 Essential (primary) hypertension: Secondary | ICD-10-CM | POA: Insufficient documentation

## 2022-11-14 DIAGNOSIS — Z7982 Long term (current) use of aspirin: Secondary | ICD-10-CM | POA: Insufficient documentation

## 2022-11-14 DIAGNOSIS — J189 Pneumonia, unspecified organism: Secondary | ICD-10-CM

## 2022-11-14 DIAGNOSIS — J168 Pneumonia due to other specified infectious organisms: Secondary | ICD-10-CM | POA: Insufficient documentation

## 2022-11-14 DIAGNOSIS — E119 Type 2 diabetes mellitus without complications: Secondary | ICD-10-CM | POA: Insufficient documentation

## 2022-11-14 DIAGNOSIS — Z91010 Allergy to peanuts: Secondary | ICD-10-CM | POA: Diagnosis not present

## 2022-11-14 DIAGNOSIS — Z20822 Contact with and (suspected) exposure to covid-19: Secondary | ICD-10-CM | POA: Insufficient documentation

## 2022-11-14 LAB — RESP PANEL BY RT-PCR (RSV, FLU A&B, COVID)  RVPGX2
Influenza A by PCR: NEGATIVE
Influenza B by PCR: NEGATIVE
Resp Syncytial Virus by PCR: NEGATIVE
SARS Coronavirus 2 by RT PCR: NEGATIVE

## 2022-11-14 MED ORDER — FREESTYLE LIBRE 3 SENSOR MISC: 11 refills | Status: DC

## 2022-11-14 MED ORDER — ONDANSETRON HCL 4 MG PO TABS
4.0000 mg | ORAL_TABLET | Freq: Four times a day (QID) | ORAL | 0 refills | Status: DC | PRN
Start: 2022-11-14 — End: 2022-11-28

## 2022-11-14 MED ORDER — AZITHROMYCIN 250 MG PO TABS: 250.0000 mg | ORAL_TABLET | Freq: Every day | ORAL | 0 refills | Status: DC

## 2022-11-14 NOTE — Discharge Instructions (Signed)
Please take the entire course of antibiotics and follow-up closely with your primary care doctor.  You can use the nausea medication up to every 6 hours as needed.

## 2022-11-14 NOTE — ED Triage Notes (Signed)
Productive cough, runny nose, headache, and subjective chills x 6 days.   Says she suspects pneumonia, or a virus.

## 2022-11-14 NOTE — ED Provider Notes (Signed)
[facility] Provider Note   CSN: [id number] Arrival date & time: 11/14/22  0249     History  Chief Complaint  Patient presents with   Cough    Margaret Larsen is a 52 y.o. female past medical history of hyperlipidemia, hypertension, obesity, bipolar, diabetes who presents concern for part of cough, runny nose, headache, subjective chills, patient reports that she suspect that she has pneumonia or a virus.  Denies chest pain, nausea,, reports that she does not take any medication for blood pressure, although she does have a documented history in her chart.   Cough      Home Medications Prior to Admission medications   Medication Sig Start Date End Date Taking? Authorizing Provider  azithromycin (ZITHROMAX) 250 MG tablet Take 1 tablet (250 mg total) by mouth daily. Take first 2 tablets together, then 1 every day until finished. 11/14/22  Yes Ruthellen Tippy H, PA-C  ondansetron (ZOFRAN) 4 MG tablet Take 1 tablet (4 mg total) by mouth every 6 (six) hours as needed for nausea or vomiting. 11/14/22  Yes Drako Maese H, PA-C  aspirin 81 MG EC tablet Take 81 mg by mouth 3 (three) times a week. Reported on 05/19/2015    [provider]  atorvastatin (LIPITOR) 40 MG tablet Take 1 tablet (40 mg total) by mouth daily. 09/24/22   McDiarmid, Leighton Roach, MD  cetirizine (ZYRTEC) 10 MG tablet Take 1 tablet (10 mg total) by mouth daily. 07/13/18   Allayne Stack, DO  Continuous Glucose Sensor (FREESTYLE LIBRE 3 SENSOR) MISC Place 1 sensor on the skin every 14 days. Use to check glucose continuously 05/02/22   McDiarmid, Leighton Roach, MD  fluticasone (FLONASE) 50 MCG/ACT nasal spray Place 2 sprays into both nostrils daily. 05/06/22   McDiarmid, Leighton Roach, MD  insulin aspart (NOVOLOG FLEXPEN) 100 UNIT/ML FlexPen Inject 30-40 Units into the skin 2 (two) times daily with a meal. 08/01/22   McDiarmid, Leighton Roach, MD  insulin degludec (TRESIBA FLEXTOUCH) 200  UNIT/ML FlexTouch Pen Inject 70 Units into the skin daily. 09/12/22   McDiarmid, Leighton Roach, MD  lidocaine (XYLOCAINE) 2 % solution Use as directed 15 mLs in the mouth or throat every 3 (three) hours as needed for mouth pain. Swish/gargle and spit out 11/09/22   Rising, Lurena Joiner, PA-C  ondansetron (ZOFRAN-ODT) 4 MG disintegrating tablet Take 1 tablet (4 mg total) by mouth every 6 (six) hours as needed for nausea or vomiting. 11/09/22   Rising, Rebecca, PA-C  Semaglutide, 2 MG/DOSE, (OZEMPIC, 2 MG/DOSE,) 8 MG/3ML SOPN Inject 2 mg into the skin once a week. 09/24/22   McDiarmid, Leighton Roach, MD      Allergies    Metformin and related, Peanut-containing drug products, Insulin glargine, Jardiance [empagliflozin], Rosuvastatin calcium, Codeine, Diphenhydramine hcl, Fexofenadine-pseudoephed er, Latuda [lurasidone hcl], and Shellfish allergy    Review of Systems   Review of Systems  Respiratory:  Positive for cough.   All other systems reviewed and are negative.   Physical Exam Updated Vital Signs BP (!) 161/96 (BP Location: Right Arm)   Pulse 88   Temp 98.3 F (36.8 C) (Oral)   Resp 16   Ht 4\' 11"  (1.499 m)   Wt 92.5 kg   SpO2 92%   BMI 41.20 kg/m  Physical Exam Vitals and nursing note reviewed.  Constitutional:      General: She is not in acute distress.    Appearance: Normal appearance.  HENT:  Head: Normocephalic and atraumatic.  Eyes:     General:        Right eye: No discharge.        Left eye: No discharge.  Cardiovascular:     Rate and Rhythm: Normal rate and regular rhythm.     Heart sounds: No murmur heard.    No friction rub. No gallop.  Pulmonary:     Effort: Pulmonary effort is normal.     Breath sounds: Normal breath sounds.     Comments: No wheezing, rhonchi, stridor, rales, no respiratory distress on my exam Abdominal:     General: Bowel sounds are normal.     Palpations: Abdomen is soft.  Skin:    General: Skin is warm and dry.     Capillary Refill: Capillary refill  takes less than 2 seconds.  Neurological:     Mental Status: She is alert and oriented to person, place, and time.  Psychiatric:        Mood and Affect: Mood normal.        Behavior: Behavior normal.     ED Results / Procedures / Treatments   Labs (all labs ordered are listed, but only abnormal results are displayed) Labs Reviewed  RESP PANEL BY RT-PCR (RSV, FLU A&B, COVID)  [id number]    EKG None  Radiology DG Chest 2 View  Result Date: 11/14/2022 CLINICAL DATA:  Productive cough EXAM: CHEST - 2 VIEW COMPARISON:  12/08/2021 FINDINGS: Heart and mediastinal contours within normal limits. Left lung clear. Patchy opacity in the right upper lobe concerning for pneumonia. No effusions or acute bony abnormality. IMPRESSION: Patchy opacity in the right upper lobe concerning for pneumonia. Electronically Signed   By: Charlett Nose M.D.   On: 11/14/2022 03:24    Procedures Procedures    Medications Ordered in ED Medications - No data to display  ED Course/ Medical Decision Making/ A&P                                 Medical Decision Making Amount and/or Complexity of Data Reviewed Radiology: ordered.  Risk Prescription drug management.   This patient is a 52 y.o. female  who presents to the ED for concern of cough, general malaise, lack of appetite.   Differential diagnoses prior to evaluation: The emergent differential diagnosis includes, but is not limited to,  asthma exacerbation, COPD exacerbation, acute upper respiratory infection, acute bronchitis, chronic bronchitis, interstitial lung disease, ARDS, PE, pneumonia, atypical ACS, carbon monoxide poisoning, spontaneous pneumothorax, new CHF vs CHF exacerbation, versus other . This is not an exhaustive differential.   Past Medical History / Co-morbidities / Social History:  hyperlipidemia, hypertension, obesity, bipolar, diabetes  Additional history: Chart reviewed. Pertinent results include: Reviewed outpatient family  medicine visits, no recent emergency department visits to review  Physical Exam: Physical exam performed. The pertinent findings include: Patient with some hypertension in the emergency department, blood pressure 161/96 on my evaluation, she has stable respiratory rate, no wheezing, rhonchi, stridor, rales, she is normal oxygen saturation on room air.  Lab Tests/Imaging studies: I personally interpreted labs/imaging and the pertinent results include: Independently interpreted RVP which is negative.  COVID, flu, RSV. CXR shows early upper right pneumonia. I agree with the radiologist interpretation.   Medications: I ordered medication including discharged with azithromycin, Zofran for nausea.  I have reviewed the patients home medicines and have made adjustments as needed.   Disposition:  After consideration of the diagnostic results and the patients response to treatment, I feel that patient stable for discharge with early pneumonia, encouraged follow-up with PCP for blood pressure management and evaluation to therapy.Marland Kitchen   emergency department workup does not suggest an emergent condition requiring admission or immediate intervention beyond what has been performed at this time. The plan is: as above. The patient is safe for discharge and has been instructed to return immediately for worsening symptoms, change in symptoms or any other concerns.  Final Clinical Impression(s) / ED Diagnoses Final diagnoses:  Pneumonia of right upper lobe due to infectious organism  Hypertension, unspecified type    Rx / DC Orders ED Discharge Orders          Ordered    azithromycin (ZITHROMAX) 250 MG tablet  Daily        11/14/22 0515    ondansetron (ZOFRAN) 4 MG tablet  Every 6 hours PRN        11/14/22 0515              Olene Floss, PA-C 11/14/22 0526    Palumbo, April, MD 11/14/22 [phone]

## 2022-11-14 NOTE — Telephone Encounter (Signed)
Patient calls to report she will be unable to keep appointment for later today.  She was diagnosed with pneumonia earlier today and is not feeling well.  Since last contact patient reports she has been unable to obtain any additional sensors.  She reports that she is in need of a PA for her sensors.   She reports her blood sugars are much improved with her current regimen.   Single Libre 3 sensor provided for patient use while Prior Authorization (PA) approval is reviewed/attempted.   Contacted pharmacy and clarified that they were in need of PA approval.  Requested then provide [place] with an additional PA request via fax.     Total time with patient call and documentation of interaction: 16 minutes.  F/U Phone call planned: none  Patient rescheduled for in office visit in 2 weeks.

## 2022-11-15 ENCOUNTER — Telehealth: Payer: Self-pay

## 2022-11-15 ENCOUNTER — Other Ambulatory Visit (HOSPITAL_COMMUNITY): Payer: Self-pay

## 2022-11-15 NOTE — Telephone Encounter (Signed)
Pharmacy Patient Advocate Encounter  Received notification from [facility] - MEDICAID that Prior Authorization for FREESTYLE LIBRE 3 SENSOR has been APPROVED from 11/15/22 to 05/15/23   PA #/Case ID/Reference #: [id number]

## 2022-11-15 NOTE — Telephone Encounter (Signed)
Pharmacy Patient Advocate Encounter   Received notification from CoverMyMeds that prior authorization for FREESTYLE LIBRE 3 SENSOR is required/requested.   Insurance verification completed.   The patient is insured through [facility] MEDICAID .   Per test claim: PA required; PA submitted to above mentioned insurance via CoverMyMeds Key/confirmation #/EOC [id number]. Status is pending

## 2022-11-15 NOTE — Telephone Encounter (Signed)
Patient calls nurse line regarding concerns with continued cough.   She was seen in the ED yesterday and was diagnosed with pneumonia. She was started on abx. She is asking how long it should take before she starts to feel better.   Advised that it could take a few days before she notices symptom improvement. She reports that she has a lot of mucus that is making it difficult for her to sleep at night. She continues to have chills and vomiting. She has not taken her temperature, as she does not have thermometer.   She is requesting medication to help with cough. She has tried multiple OTC medications, with no relief.   She reports that blood sugar levels have been running in the 300's.   Spoke with Dr. Georg Ruddle who advised that she would call patient.   ED precautions and supportive measures discussed.   Veronda Prude, RN

## 2022-11-15 NOTE — Telephone Encounter (Signed)
Spoke to patient regarding symptoms of pneumonia. Patient is experiencing increased mucus production and vomitting associated with the cough. She has also been having high blood sugars in the high 200s to 300s. She has not been taking her meal time novolog as she is unable to keep most food down. She is able to drink some but did not take any meal time coverage. Discussed she could try cutting her meal time dose in half to treat her high blood sugars. Unfortunately, we have no appointments today. Scheduled for an appointment with Dr. Jena Gauss on Monday.  Gave ED return precautions.

## 2022-11-18 ENCOUNTER — Ambulatory Visit (INDEPENDENT_AMBULATORY_CARE_PROVIDER_SITE_OTHER): Payer: Medicaid Other | Admitting: Student

## 2022-11-18 ENCOUNTER — Encounter: Payer: Self-pay | Admitting: Student

## 2022-11-18 VITALS — BP 138/96 | HR 92 | Wt 206.0 lb

## 2022-11-18 DIAGNOSIS — E119 Type 2 diabetes mellitus without complications: Secondary | ICD-10-CM | POA: Diagnosis not present

## 2022-11-18 DIAGNOSIS — Z794 Long term (current) use of insulin: Secondary | ICD-10-CM | POA: Diagnosis not present

## 2022-11-18 DIAGNOSIS — J189 Pneumonia, unspecified organism: Secondary | ICD-10-CM | POA: Diagnosis present

## 2022-11-18 LAB — POCT GLYCOSYLATED HEMOGLOBIN (HGB A1C): HbA1c, POC (controlled diabetic range): 9.4 % — AB (ref 0.0–7.0)

## 2022-11-18 MED ORDER — BENZONATATE 100 MG PO CAPS: 100.0000 mg | ORAL_CAPSULE | Freq: Two times a day (BID) | ORAL | 0 refills | Status: DC | PRN

## 2022-11-18 MED ORDER — FREESTYLE LIBRE 3 PLUS SENSOR MISC: 11 refills | Status: DC

## 2022-11-18 NOTE — Addendum Note (Signed)
Addended by: Kathrin Ruddy on: 11/18/2022 09:54 AM   Modules accepted: Orders

## 2022-11-18 NOTE — Patient Instructions (Addendum)
It was great to see you today! Thank you for choosing [facility] for your primary care.  Today we addressed: Please take Ibuprofen and Tylenol for pain control Caution with ambulation and take frequent breaks at work  Take 1 capsule (100 mg total) by mouth 2 (two) times daily as needed for cough  Stay hydrated  Anticipate continued cough for a few weeks    If you haven't already, sign up for My Chart to have easy access to your labs results, and communication with your primary care physician. I recommend that you always bring your medications to each appointment as this makes it easy to ensure you are on the correct medications and helps [place] not miss refills when you need them. Call the clinic at [phone] if your symptoms worsen or you have any concerns. Return in about 4 weeks (around 12/16/2022), or if symptoms worsen or fail to improve. Please arrive 15 minutes before your appointment to ensure smooth check in process.  We appreciate your efforts in making this happen.  Thank you for allowing me to participate in your care, Alfredo Martinez, MD 11/18/2022, 2:05 PM PGY-3, [facility]

## 2022-11-18 NOTE — Progress Notes (Signed)
    SUBJECTIVE:   CHIEF COMPLAINT / HPI:   RUL PNA:  Went to ED for evaluation 11/14/2022 for URI symptoms with cough, runny nose, headache. Quad panel negative, CXR with RUL PNA-treated with azithro.   Returns today reporting continued cough.  Has tried Mucinex, Catering manager plus with minimal benefit   However, the patient does report that she has had some improvement.  Overall, symptoms have begun to resolve.  Still has dyspnea with exertion but has improved to some extent.  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  No chest pain.  No history of asthma or smoking history.   PERTINENT  PMH / PSH: HLD- LDL 140 7 mo ago HTN Obesity Bipolar DM2 - 8.7--3 mo ago  on basal insulin Tresiba (insulin degludec) 70 units daily, Novolog (insulin aspart) from 40-45 with meals to 45 units with lunch and 50 units with dinner, Ozempic 2 mg   OBJECTIVE:   BP (!) 138/96   Pulse 92   Wt 206 lb (93.4 kg)   SpO2 92%   BMI 41.61 kg/m   General: Alert and oriented in no apparent distress Heart: Regular rate and rhythm with no murmurs appreciated Lungs: Bilateral bibasilar crackles, no focal diminishment noted, normal work of breathing. Abdomen: no abdominal pain Skin: Warm and dry   ASSESSMENT/PLAN:   Assessment & Plan Pneumonia of right upper lobe due to infectious organism Documented on chest x-ray as evolving pneumonia.  Just completed azithromycin, seems to be improving overall.  Provided the patient with Tessalon Pearls, discussed not to use around children given its toxicity to children.  Discussed the length of time it will take for cough to subside.  Continue with conservative treatment management including over-the-counter analgesia, humidifier.  Monitor for worsening dyspnea or chest pain, strict return precautions given. Type 2 diabetes mellitus treated with insulin (HCC) Repeat A1C today.  See above for medication regimen, will alter as  necessary.  Elevated blood pressure without hypertension x 2 in office today.  Instructed the patient to return in 1 week for nursing visit for repeat blood pressure.   Alfredo Martinez, MD [facility]

## 2022-11-18 NOTE — Telephone Encounter (Signed)
Reviewed and agree with Dr Koval's plan.   

## 2022-11-18 NOTE — Assessment & Plan Note (Addendum)
Repeat A1C today.  See above for medication regimen, will alter as necessary.

## 2022-11-18 NOTE — Telephone Encounter (Signed)
Patient contacted for follow-up of CGM access.  We shared that her Josephine Igo 3 sensors were approved through Prior Authorization.     Patient shared that the pharmacy is out of stock.   We agreed to try a new prescription for [facility] 3 Plus sensors.  New PA anticipated.     Total time with patient call and documentation of interaction: 11 minutes.

## 2022-11-26 ENCOUNTER — Ambulatory Visit: Payer: Self-pay

## 2022-11-28 ENCOUNTER — Encounter: Payer: Self-pay | Admitting: Pharmacist

## 2022-11-28 ENCOUNTER — Ambulatory Visit: Payer: Medicaid Other | Admitting: Pharmacist

## 2022-11-28 ENCOUNTER — Ambulatory Visit: Payer: Medicaid Other

## 2022-11-28 VITALS — BP 143/94 | HR 91 | Wt 210.4 lb

## 2022-11-28 DIAGNOSIS — E119 Type 2 diabetes mellitus without complications: Secondary | ICD-10-CM

## 2022-11-28 DIAGNOSIS — Z794 Long term (current) use of insulin: Secondary | ICD-10-CM

## 2022-11-28 DIAGNOSIS — E1140 Type 2 diabetes mellitus with diabetic neuropathy, unspecified: Secondary | ICD-10-CM | POA: Diagnosis not present

## 2022-11-28 DIAGNOSIS — E785 Hyperlipidemia, unspecified: Secondary | ICD-10-CM

## 2022-11-28 MED ORDER — ATORVASTATIN CALCIUM 40 MG PO TABS: 40.0000 mg | ORAL_TABLET | Freq: Every day | ORAL | 11 refills | Status: DC

## 2022-11-28 MED ORDER — OZEMPIC (2 MG/DOSE) 8 MG/3ML ~~LOC~~ SOPN: 2.0000 mg | PEN_INJECTOR | SUBCUTANEOUS | 2 refills | Status: DC

## 2022-11-28 MED ORDER — NOVOLOG FLEXPEN 100 UNIT/ML ~~LOC~~ SOPN: 20.0000 [IU] | PEN_INJECTOR | Freq: Three times a day (TID) | SUBCUTANEOUS | 1 refills | Status: DC

## 2022-11-28 MED ORDER — TRESIBA FLEXTOUCH 200 UNIT/ML ~~LOC~~ SOPN: 76.0000 [IU] | PEN_INJECTOR | Freq: Every day | SUBCUTANEOUS | 3 refills | Status: DC

## 2022-11-28 NOTE — Assessment & Plan Note (Signed)
ASCVD risk - primary prevention in patient with diabetes. Last LDL is 140 not at goal of <66 mg/dL. Patient is nonadherent to atorvastatin or aspirin. -Continued atorvastatin 40 mg once daily, aspirin 81 mg once daily - encouraged adherence

## 2022-11-28 NOTE — Patient Instructions (Signed)
It was nice to see you today!  Your goal blood sugar is 80-130 before eating and less than 180 after eating.  Medication Changes: RESTART atorvastatin 40 mg once daily  START using 20 units of insulin Novolog with your late night cereal   Try to take your aspirin every day.   Continue all other medication the same.   Monitor blood sugars at home and keep a log (glucometer or piece of paper) to bring with you to your next visit.  Keep up the good work with diet and exercise. Aim for a diet full of vegetables, fruit and lean meats (chicken, [place], fish). Try to limit salt intake by eating fresh or frozen vegetables (instead of canned), rinse canned vegetables prior to cooking and do not add any additional salt to meals.

## 2022-11-28 NOTE — Assessment & Plan Note (Signed)
Diabetes longstanding currently uncontrolled. Unable to access patient's full CGM report to get assessment, but showing improvement from earlier this year. Patient is able to verbalize appropriate hypoglycemia management plan. Medication adherence appears good. Control is suboptimal due to currently being sick, resulting in a fluctuating appetite, and taking cough syrup. -Continued insulin Novolog (aspart) 40-45 units BID with meals, insulin Tresiba (degludec) 75 units once daily, semaglutide (Ozempic) 2 mg once daily -Started insulin Novolog (aspart) 20 units with late night cereal in attempt to lower late night glucose elevations -Patient educated on purpose, proper use, and potential adverse effects.  -Extensively discussed pathophysiology of diabetes, recommended lifestyle interventions, dietary effects on blood sugar control.  -Counseled on s/sx of and management of hypoglycemia.  -Next A1c anticipated 01/15/2022

## 2022-11-28 NOTE — Progress Notes (Signed)
S:     Chief Complaint  Patient presents with   Medication Management    T2DM   52 y.o. female who presents for diabetes evaluation, education, and management. Patient arrives in good spirits and presents without any assistance.  Patient was last seen by Primary Care Provider, Dr. Jena Gauss, on 11/18/2022.   PMH is significant for T2DM, HTN, HLD, BPD.  Last visit was following ED visit on 11/09/2022 where patient presented with pneumonia.  She reports she is still recovering from her illness but is improving gradually.   Patient reports Diabetes was diagnosed in 2010.   Family/Social History: Grandparents had T2DM  Current diabetes medications include: insulin Novolog (aspart) 40-45 units BID with meals - only taking it once daily with her one large meal, insulin Tresiba (degludec) 75 units once daily, semaglutide (Ozempic) 2 mg once daily Current hypertension medications include: None Current hyperlipidemia medications include: atorvastatin 40 mg once daily  Patient reports adherence to taking all medications as prescribed. However, she reports using aspirin only ~ 1 x weekly. She is nonadherent to her atorvastatin, reporting that there was an insurance issue with picking it up.   Do you feel that your medications are working for you? Yes   Have you been experiencing any side effects to the medications prescribed? No  Do you have any problems obtaining medications due to transportation or finances? yes Insurance coverage: [place] Medicaid  Patient reports hypoglycemic events. Yesterday she took her insulin Novolog but did not eat enough with it, and felt like she was going to pass out  Patient reports nocturia (nighttime urination). ~1x nightly Patient denies neuropathy (nerve pain). Patient denies visual changes. Patient reports self foot exams. - Reports she has a knot on the bottom of her left foot that is bothering her, but no change  Patient reported dietary habits: Eats 1  meals/day and snacks throughout the day/night Dinner: baked and fried meat, green beans Snacks: cheese crackers, applesauce, cereal Drinks: water, sweet tea  Patient-reported exercise habits: walks at work as a Psychologist, clinical at [employer]  O:   Review of Systems  Respiratory:  Positive for cough.   All other systems reviewed and are negative.   Physical Exam Vitals reviewed.  Constitutional:      Appearance: Normal appearance.  Pulmonary:     Effort: Pulmonary effort is normal.  Neurological:     Mental Status: She is alert.  Psychiatric:        Thought Content: Thought content normal.        Judgment: Judgment normal.     Libre3 CGM Download today on 11/28/2022 Glucose Management Indicator: 8.2  Time in Goal:  - Time in range 70-180: 67% - Time above range: 33% - Time below range: 0% Observed patterns:   Lab Results  Component Value Date   HGBA1C 9.4 (A) 11/18/2022   Vitals:   11/28/22 1525 11/28/22 1527  BP: (!) 137/98 (!) 143/94  Pulse: 91   SpO2: 100%     Lipid Panel     Component Value Date/Time   CHOL 210 (H) 04/08/2022 1755   TRIG 99 04/08/2022 1755   HDL 52 04/08/2022 1755   CHOLHDL 4.0 04/08/2022 1755   CHOLHDL 4.6 05/19/2015 0933   VLDL 27 05/19/2015 0933   LDLCALC 140 (H) 04/08/2022 1755    Clinical Atherosclerotic Cardiovascular Disease (ASCVD): No  The 10-year ASCVD risk score (Arnett DK, et al., 2019) is: 11.1%   Values used to calculate  the score:     Age: 22 years     Sex: Female     Is Non-Hispanic African American: Yes     Diabetic: Yes     Tobacco smoker: No     Systolic Blood Pressure: 143 mmHg     Is BP treated: No     HDL Cholesterol: 52 mg/dL     Total Cholesterol: 210 mg/dL   Patient is participating in a Managed Medicaid Plan:  Yes   A/P: Diabetes longstanding currently uncontrolled. Unable to access patient's full CGM report to get assessment, but showing improvement from earlier this year. Patient is able  to verbalize appropriate hypoglycemia management plan. Medication adherence appears good. Control is suboptimal due to currently being sick, resulting in a fluctuating appetite, and taking cough syrup. -Continued insulin Novolog (aspart) 40-45 units BID with meals, insulin Tresiba (degludec) 75 units once daily, semaglutide (Ozempic) 2 mg once daily -Started insulin Novolog (aspart) 20 units with late night cereal in attempt to lower late night glucose elevations -Patient educated on purpose, proper use, and potential adverse effects.  -Extensively discussed pathophysiology of diabetes, recommended lifestyle interventions, dietary effects on blood sugar control.  -Counseled on s/sx of and management of hypoglycemia.  -Next A1c anticipated 01/15/2022  ASCVD risk - primary prevention in patient with diabetes. Last LDL is 140 not at goal of <16 mg/dL. Patient is nonadherent to atorvastatin or aspirin. -Continued atorvastatin 40 mg once daily, aspirin 81 mg once daily - encouraged adherence  Elevated BP in office today, but reports no history of HTN. Blood pressure goal of <130/80 mmHg. No medications for BP. Blood pressure control is suboptimal possibly due to taking cough syrup that is likely elevating BP.  Written patient instructions provided. Patient verbalized understanding of treatment plan.  Total time in face to face counseling 51 minutes.    Follow-up:  Pharmacist 01/15/2022 PCP clinic visit in [place] Patient seen with Shona Simpson, PharmD Candidate.

## 2022-11-29 NOTE — Progress Notes (Signed)
Reviewed and agree with Dr Koval's plan.   

## 2023-01-04 ENCOUNTER — Other Ambulatory Visit: Payer: Self-pay | Admitting: Family Medicine

## 2023-01-04 DIAGNOSIS — E119 Type 2 diabetes mellitus without complications: Secondary | ICD-10-CM

## 2023-01-16 ENCOUNTER — Ambulatory Visit: Payer: Medicaid Other | Admitting: Pharmacist

## 2023-01-28 ENCOUNTER — Ambulatory Visit: Payer: Medicaid Other | Admitting: Pharmacist

## 2023-02-04 ENCOUNTER — Ambulatory Visit: Payer: Medicaid Other | Admitting: Pharmacist

## 2023-02-17 ENCOUNTER — Telehealth: Payer: Self-pay | Admitting: Pharmacist

## 2023-02-17 NOTE — Telephone Encounter (Signed)
 Attempted to contact patient for follow-up of diabetes management follow-up visit. Patient did not answer and left a voicemail message.    Left HIPAA compliant voice mail requesting call back to direct phone: [phone]  Total time with patient call and documentation of interaction: 3 minutes.

## 2023-03-03 ENCOUNTER — Telehealth: Payer: Self-pay | Admitting: Pharmacist

## 2023-03-03 NOTE — Telephone Encounter (Signed)
 Patient contacted for follow-up of tobacco cessation / BP f/u  Patient scheduled to visit on March 6th at 3:30 p.m.   Total time with patient call and documentation of interaction: 4 minutes.

## 2023-03-10 ENCOUNTER — Telehealth: Payer: Self-pay

## 2023-03-10 NOTE — Telephone Encounter (Signed)
 Pharmacy Patient Advocate Encounter   Received notification from CoverMyMeds that prior authorization for [facility] 2MG  is required/requested.   Insurance verification completed.   The patient is insured through [facility] .   PA required; PA submitted to above mentioned insurance via CoverMyMeds Key/confirmation #/EOC [id number]. Status is pending

## 2023-03-11 NOTE — Telephone Encounter (Signed)
 Pharmacy Patient Advocate Encounter  Received notification from [facility] MEDICAID that Prior Authorization for [facility] 2MG  has been APPROVED from 03/10/23 to 03/09/24   PA #/Case ID/Reference #: [id number]

## 2023-03-13 ENCOUNTER — Ambulatory Visit: Payer: Medicaid Other | Admitting: Pharmacist

## 2023-03-13 ENCOUNTER — Encounter: Payer: Self-pay | Admitting: Pharmacist

## 2023-03-13 VITALS — BP 146/99 | HR 77 | Wt 207.4 lb

## 2023-03-13 DIAGNOSIS — E119 Type 2 diabetes mellitus without complications: Secondary | ICD-10-CM | POA: Diagnosis not present

## 2023-03-13 DIAGNOSIS — Z794 Long term (current) use of insulin: Secondary | ICD-10-CM

## 2023-03-13 NOTE — Patient Instructions (Signed)
 It was nice to see you today!  Your goal blood sugar is 80-130 before eating and less than 180 after eating.  Call to schedule back with Dr. Raymondo Band around the start of May.  Medication Changes:  Continue all other medication the same.   Keep up the good work with diet and exercise. Aim for a diet full of vegetables, fruit and lean meats (chicken, [place], fish). Try to limit salt intake by eating fresh or frozen vegetables (instead of canned), rinse canned vegetables prior to cooking and do not add any additional salt to meals.

## 2023-03-13 NOTE — Progress Notes (Signed)
 S:     53 y.o. female who presents for diabetes evaluation, education, and management. Patient arrives in fairly good spirits and presents without any assistance.  Patient was last seen by Primary Care Provider, Dr. Jena Gauss, on 11/18/2022.    PMH is significant for T2DM, HTN, HLD.  At last visit, patient was initiated on insulin Novolog (aspart) 20 units with late night cereal.    Patient reports Diabetes was diagnosed in 2010.    Family/Social History: Grandparents had T2DM  Current diabetes medications include: insulin Novolog (aspart) 40-45 units BID with larger meals and 20 units with late night cereal, insulin Tresiba (degludec) 75 units once daily, semaglutide (Ozempic) 2 mg once daily Current hypertension medications include: None Current hyperlipidemia medications include: atorvastatin 40 mg once daily  Patient reports adherence on diabetes medications. However, patient denies adherence with other medications, reports missing aspirin and atorvastatin medications. Last dose for each medication was last week. For aspirin, patient takes 1 time per week, on average. For atorvastatin, patient takes 2 times per week, on average.   Do you feel that your medications are working for you? Yes Have you been experiencing any side effects to the medications prescribed? No Do you have any problems obtaining medications due to transportation or finances? No Insurance coverage: [place] Medicaid  Patient reports hypoglycemic events. Mealtime variability results in occasional hypoglycemic events in the evening.  Discussed weight goal for the next 10 months.  Patient self-identified a weight < 200lbs as her goal.  She feels confident she can achieve her weight goal this year.   O:   Review of Systems  All other systems reviewed and are negative.   Physical Exam Vitals reviewed.  Constitutional:      Appearance: Normal appearance.  Pulmonary:     Effort: Pulmonary effort is normal.   Neurological:     Mental Status: She is alert.  Psychiatric:        Behavior: Behavior normal.        Thought Content: Thought content normal.        Judgment: Judgment normal.     Libre3 CGM Download today 03/13/23 % Time CGM is active: 76% Average Glucose: 225 mg/dL Glucose Management Indicator: 8.7  Glucose Variability: 31.5% (goal <36%) Time in Goal:  - Time in range 70-180: 27% - Time above range: 73% - Time below range: 0% Observed patterns:   Lab Results  Component Value Date   HGBA1C 9.4 (A) 11/18/2022   Vitals:   03/13/23 1537  BP: (!) 146/99  Pulse: 77  SpO2: 98%    Lipid Panel     Component Value Date/Time   CHOL 210 (H) 04/08/2022 1755   TRIG 99 04/08/2022 1755   HDL 52 04/08/2022 1755   CHOLHDL 4.0 04/08/2022 1755   CHOLHDL 4.6 05/19/2015 0933   VLDL 27 05/19/2015 0933   LDLCALC 140 (H) 04/08/2022 1755    Clinical Atherosclerotic Cardiovascular Disease (ASCVD): No  The 10-year ASCVD risk score (Arnett DK, et al., 2019) is: 11.9%   Values used to calculate the score:     Age: 53 years     Sex: Female     Is Non-Hispanic African American: Yes     Diabetic: Yes     Tobacco smoker: No     Systolic Blood Pressure: 146 mmHg     Is BP treated: No     HDL Cholesterol: 52 mg/dL     Total Cholesterol: 210 mg/dL   Patient is  participating in a Managed Medicaid Plan:  Yes   A/P: Diabetes longstanding currently uncontrolled. Patient is able to verbalize appropriate hypoglycemia management plan. Medication adherence appears good for diabetes medications. Control is suboptimal due to sedentary lifestyle, dietary indiscretion, and limited physical activity outside of activity at current employment.  -Continued insulin Novolog (aspart) 40-45 units BID with meals and 20 units with late night cereal, insulin Tresiba (degludec) 75 units once daily, semaglutide (Ozempic) 2 mg once daily. -Patient educated on purpose, proper use, and potential adverse effects.   -Extensively discussed pathophysiology of diabetes, recommended lifestyle interventions, dietary effects on blood sugar control.  -Counseled on s/sx of and management of hypoglycemia.  -Counseled on non-pharmacological strategies to reduce blood sugar, including dietary changes and physical activity (enjoys walking) -Next A1c anticipated at planned upcoming PCP visit.   ASCVD risk - primary prevention in patient with diabetes. Last LDL is 140 not at goal of <40 mg/dL. Patient is nonadherent to atorvastatin or aspirin. -Continued atorvastatin 40 mg once daily, aspirin 81 mg once daily - encouraged adherence  Elevated BP in office today, but reports no history of HTN. Blood pressure goal of <130/80 mmHg. No medications for BP. Blood pressure control is suboptimal possibly due to stressful phone call at work.  Written patient instructions provided. Patient verbalized understanding of treatment plan.  Total time in face to face counseling 27 minutes.    Follow-up:  Pharmacist Dr. Raymondo Band PCP clinic visit in 03/27/23 - to meet Dr. Georg Ruddle Patient seen with Threasa Heads, PharmD Candidate and Mack Guise, PharmD Candidate.

## 2023-03-13 NOTE — Assessment & Plan Note (Signed)
 Diabetes longstanding currently uncontrolled. Patient is able to verbalize appropriate hypoglycemia management plan. Medication adherence appears good for diabetes medications. Control is suboptimal due to sedentary lifestyle, dietary indiscretion, and limited physical activity outside of activity at current employment.  -Continued insulin Novolog (aspart) 40-45 units BID with meals and 20 units with late night cereal, insulin Tresiba (degludec) 75 units once daily, semaglutide (Ozempic) 2 mg once daily. -Patient educated on purpose, proper use, and potential adverse effects.  -Extensively discussed pathophysiology of diabetes, recommended lifestyle interventions, dietary effects on blood sugar control.  -Counseled on s/sx of and management of hypoglycemia.  -Counseled on non-pharmacological strategies to reduce blood sugar, including dietary changes and physical activity (enjoys walking)

## 2023-03-17 NOTE — Progress Notes (Signed)
 Reviewed and agree with Dr Macky Lower plan.

## 2023-03-20 ENCOUNTER — Telehealth: Payer: Self-pay

## 2023-03-20 NOTE — Telephone Encounter (Signed)
 Patient LVM on nurse line requesting a call back.   Called patient, however no answer.   VM left asking her to return my call.

## 2023-03-27 ENCOUNTER — Ambulatory Visit: Admitting: Family Medicine

## 2023-03-27 ENCOUNTER — Other Ambulatory Visit: Payer: Self-pay | Admitting: Family Medicine

## 2023-03-27 VITALS — BP 154/84 | HR 87 | Ht 59.0 in | Wt 209.6 lb

## 2023-03-27 DIAGNOSIS — Z0000 Encounter for general adult medical examination without abnormal findings: Secondary | ICD-10-CM

## 2023-03-27 DIAGNOSIS — Z794 Long term (current) use of insulin: Secondary | ICD-10-CM

## 2023-03-27 DIAGNOSIS — I10 Essential (primary) hypertension: Secondary | ICD-10-CM | POA: Diagnosis not present

## 2023-03-27 DIAGNOSIS — J301 Allergic rhinitis due to pollen: Secondary | ICD-10-CM | POA: Diagnosis not present

## 2023-03-27 DIAGNOSIS — R87610 Atypical squamous cells of undetermined significance on cytologic smear of cervix (ASC-US): Secondary | ICD-10-CM | POA: Diagnosis not present

## 2023-03-27 DIAGNOSIS — E1140 Type 2 diabetes mellitus with diabetic neuropathy, unspecified: Secondary | ICD-10-CM | POA: Diagnosis present

## 2023-03-27 DIAGNOSIS — M7989 Other specified soft tissue disorders: Secondary | ICD-10-CM | POA: Diagnosis not present

## 2023-03-27 DIAGNOSIS — E785 Hyperlipidemia, unspecified: Secondary | ICD-10-CM

## 2023-03-27 DIAGNOSIS — J029 Acute pharyngitis, unspecified: Secondary | ICD-10-CM

## 2023-03-27 DIAGNOSIS — E119 Type 2 diabetes mellitus without complications: Secondary | ICD-10-CM

## 2023-03-27 LAB — POCT GLYCOSYLATED HEMOGLOBIN (HGB A1C): HbA1c, POC (controlled diabetic range): 9.6 % — AB (ref 0.0–7.0)

## 2023-03-27 MED ORDER — TRESIBA FLEXTOUCH 200 UNIT/ML ~~LOC~~ SOPN: 85.0000 [IU] | PEN_INJECTOR | Freq: Every day | SUBCUTANEOUS | 3 refills | Status: DC

## 2023-03-27 MED ORDER — LOSARTAN POTASSIUM-HCTZ 50-12.5 MG PO TABS: 1.0000 | ORAL_TABLET | Freq: Every day | ORAL | 0 refills | Status: DC

## 2023-03-27 MED ORDER — CETIRIZINE HCL 10 MG PO TABS: 10.0000 mg | ORAL_TABLET | Freq: Every day | ORAL | 12 refills | Status: DC

## 2023-03-27 MED ORDER — FLUTICASONE PROPIONATE 50 MCG/ACT NA SUSP: 2.0000 | Freq: Every day | NASAL | 6 refills | Status: AC

## 2023-03-27 NOTE — Assessment & Plan Note (Signed)
 Currently on lipitor 40mg , though has not been compliant in the past. Will repeat lipid panel today.

## 2023-03-27 NOTE — Progress Notes (Signed)
  SUBJECTIVE:   CHIEF COMPLAINT / HPI:   Patient presents to meet PCP. Saw Dr. Raymondo Band for diabetes management on 3/6. No changes to regimen at that time. Patient has been taking medication as prescribed except is taking novolog once a day with meal as she only eats one big meal. Does not want to do novolog twice a day because it makes her feel bad.   Is not taking statin daily, though she knows she should. Last took it last week. Usually takes it 1-2 a week. Since her insurance is figured out is able to have medication. Will try and take it daily.    Leg Swelling - off and on for a couple of months - both legs - worse when she is on her feet for longer periods of time - no chest pain or trouble breathing - has numbness and tingling from diabetes at baseline    PERTINENT  PMH / PSH:  DMII HTN GERD Dyslipidemia   OBJECTIVE:   BP (!) 154/84   Pulse 87   Ht 4\' 11"  (1.499 m)   Wt 209 lb 9.6 oz (95.1 kg)   SpO2 98%   BMI 42.33 kg/m   General: A&O, NAD HEENT: No sign of trauma, EOM grossly intact Cardiac: RRR, no m/r/g Respiratory: CTAB, normal WOB, no w/c/r GI: Soft, NTTP, non-distended  Extremities: NTTP, no peripheral edema noted  Neuro: Normal gait, moves all four extremities appropriately. Psych: Appropriate mood and affect   ASSESSMENT/PLAN:   Assessment & Plan Type 2 diabetes mellitus with diabetic neuropathy, with long-term current use of insulin (HCC) A1C today 9.4. Patient saw Dr. Raymondo Band on 3/6 and regimen was adjusted to  insulin Novolog (aspart) 40-45 units BID with meals and 20 units with late night snack, insulin Tresiba (degludec) 75 units once daily, semaglutide (Ozempic) 2 mg once weekly. Will increase Tresiba to 85 U daily as patient does not want to take Novolog BID.  - BMP today - Urine P/Cr today - discussed need for ophthalmology exam- will return - F/u with Dr. Raymondo Band in 2 weeks  Hyperlipidemia, unspecified hyperlipidemia type Currently on lipitor  40mg , though has not been compliant in the past. Will repeat lipid panel today.  Healthcare maintenance Mammogram ordered. Hep C screening ordered.  ASCUS of cervix with negative high risk HPV Has history of HPV negative ASCUS. Colposcopy completed in 2015 which did not show concern for cervical lesions. Per recommendation, patient to get pap in 1 year. Will need appointment at another time for this  Hypertension, unspecified type Multiple encounters with elevated BP. BP elevated today. Will start on combination agent.  - losartan-hydrochlorothiazide (HYZAAR) 50-12.5 MG daily  - follow up in 2 weeks for BMP   Leg swelling Suspect secondary to venous insufficiency and increased periods of standing given the nature of her work. No concerning symptoms for cardiac cause, bilateral so do not have concern for DVT at this time. Does not actaully have any edema on exam today. Discussed using compression stockings and elevating legs. Return precautions given.    Hal Morales, MD [facility]

## 2023-03-27 NOTE — Patient Instructions (Addendum)
 It was great to see you today! Thank you for choosing [facility] for your primary care. Margaret Larsen was seen for meeting PCP.  Today we addressed: Your diabetes. Your A1C today is 9.6 I know you just made adjustments to your regimen with Dr. Raymondo Band. I will increase your Tresiba by 10 units today. Please follow up with Dr. Raymondo Band in two weeks.  I started your Blood pressure medication. Take one pill daily. Please follow up with Dr Raymondo Band in two weeks for this as well. We will also check your potassium at that visit.  Please schedule your mammogram   If you haven't already, sign up for My Chart to have easy access to your labs results, and communication with your primary care physician.  We are checking some labs today. If they are abnormal, I will call you. If they are normal, I will send you a MyChart message (if it is active) or a letter in the mail. If you do not hear about your labs in the next 2 weeks, please call the office.   You should return to our clinic Return in about 2 weeks (around 04/10/2023).  I recommend that you always bring your medications to each appointment as this makes it easy to ensure you are on the correct medications and helps [place] not miss refills when you need them.  Please arrive 15 minutes before your appointment to ensure smooth check in process.  We appreciate your efforts in making this happen.  Please call the clinic at [phone] if your symptoms worsen or you have any concerns.  Thank you for allowing me to participate in your care, Hal Morales, MD 03/27/2023, 4:41 PM PGY-1, [facility]

## 2023-03-27 NOTE — Assessment & Plan Note (Addendum)
 A1C today 9.4. Patient saw Dr. Raymondo Band on 3/6 and regimen was adjusted to  insulin Novolog (aspart) 40-45 units BID with meals and 20 units with late night snack, insulin Tresiba (degludec) 75 units once daily, semaglutide (Ozempic) 2 mg once weekly. Will increase Tresiba to 85 U daily as patient does not want to take Novolog BID.  - BMP today - Urine P/Cr today - discussed need for ophthalmology exam- will return - F/u with Dr. Raymondo Band in 2 weeks

## 2023-03-27 NOTE — Assessment & Plan Note (Addendum)
 Has history of HPV negative ASCUS. Colposcopy completed in 2015 which did not show concern for cervical lesions. Per recommendation, patient to get pap in 1 year. Will need appointment at another time for this

## 2023-03-28 ENCOUNTER — Encounter: Payer: Self-pay | Admitting: Family Medicine

## 2023-03-28 LAB — PROTEIN / CREATININE RATIO, URINE
Creatinine, Urine: 108.8 mg/dL
Protein, Ur: 16.9 mg/dL
Protein/Creat Ratio: 155 mg/g{creat} (ref 0–200)

## 2023-03-28 LAB — BASIC METABOLIC PANEL
BUN/Creatinine Ratio: 14 (ref 9–23)
BUN: 8 mg/dL (ref 6–24)
CO2: 26 mmol/L (ref 20–29)
Calcium: 9 mg/dL (ref 8.7–10.2)
Chloride: 99 mmol/L (ref 96–106)
Creatinine, Ser: 0.59 mg/dL (ref 0.57–1.00)
Glucose: 163 mg/dL — ABNORMAL HIGH (ref 70–99)
Potassium: 3.9 mmol/L (ref 3.5–5.2)
Sodium: 138 mmol/L (ref 134–144)
eGFR: 107 mL/min/{1.73_m2} (ref >59)

## 2023-03-28 LAB — LIPID PANEL
Chol/HDL Ratio: 3.9 ratio (ref 0.0–4.4)
Cholesterol, Total: 209 mg/dL — ABNORMAL HIGH (ref 100–199)
HDL: 54 mg/dL (ref >39)
LDL Chol Calc (NIH): 137 mg/dL — ABNORMAL HIGH (ref 0–99)
Triglycerides: 100 mg/dL (ref 0–149)
VLDL Cholesterol Cal: 18 mg/dL (ref 5–40)

## 2023-03-28 LAB — HCV INTERPRETATION

## 2023-03-28 LAB — HCV AB W REFLEX TO QUANT PCR: HCV Ab: NONREACTIVE

## 2023-04-01 ENCOUNTER — Telehealth: Payer: Self-pay | Admitting: Student

## 2023-04-01 NOTE — Telephone Encounter (Signed)
**   AFTER HOURS/EMERGENCY LINE **  Name/date of birth confirmed with patient.  Calls to say that her statin is making her "feel bad" and reports cough, chills, body aches.  I advised her that her symptoms sound more consistent with an acute viral illness, possibly COVID or flu. Patient is reluctant to continue taking her medications for her blood pressure or hyperlipidemia.  Advised patient to call the clinic in the morning to schedule an appointment with her PCP to discuss further.  ER return precautions discussed should her symptoms worsen, develop respiratory distress, etc.  Darral Dash, DO

## 2023-04-02 NOTE — Telephone Encounter (Signed)
 Patient returns call to nurse line regarding continued symptoms. She reports that he blood sugar readings have also been elevated within the last week. Measuring high on [place] and when checking with glucometer readings are ~350's.   Advised that due to current symptoms, she should be evaluated in office. Scheduled for tomorrow afternoon in [facility].   Veronda Prude, RN

## 2023-04-03 ENCOUNTER — Other Ambulatory Visit: Payer: Self-pay | Admitting: Student

## 2023-04-03 ENCOUNTER — Ambulatory Visit (INDEPENDENT_AMBULATORY_CARE_PROVIDER_SITE_OTHER): Admitting: Student

## 2023-04-03 ENCOUNTER — Telehealth: Payer: Self-pay | Admitting: Pharmacist

## 2023-04-03 ENCOUNTER — Ambulatory Visit
Admission: RE | Admit: 2023-04-03 | Discharge: 2023-04-03 | Disposition: A | Discharge status: Home / Self Care | Source: Ambulatory Visit | Attending: Family Medicine | Admitting: Family Medicine

## 2023-04-03 VITALS — BP 142/85 | HR 92 | Ht 59.0 in | Wt 209.8 lb

## 2023-04-03 DIAGNOSIS — I10 Essential (primary) hypertension: Secondary | ICD-10-CM

## 2023-04-03 DIAGNOSIS — E785 Hyperlipidemia, unspecified: Secondary | ICD-10-CM | POA: Diagnosis not present

## 2023-04-03 DIAGNOSIS — Z0000 Encounter for general adult medical examination without abnormal findings: Secondary | ICD-10-CM

## 2023-04-03 MED ORDER — AMLODIPINE BESYLATE 5 MG PO TABS
5.0000 mg | ORAL_TABLET | Freq: Every day | ORAL | 0 refills | Status: DC
Start: 2023-04-03 — End: 2023-05-09

## 2023-04-03 NOTE — Assessment & Plan Note (Addendum)
 Blood pressure not within goal today. Discussed discontinuing losartan-hydrochlorothiazide due to possible ARB side effects Start amlodipine 5 mg  Follow up in 2 weeks.

## 2023-04-03 NOTE — Assessment & Plan Note (Addendum)
 Stop Lipitor due to patient's side effects and not feeling well. Consider restarting as patient can tolerate. Suspect that body aches and cough are not related to the medications and that she most likely had a viral URI (also c/w elevated blood sugars)

## 2023-04-03 NOTE — Progress Notes (Signed)
    SUBJECTIVE:   CHIEF COMPLAINT / HPI:   Margaret Larsen is a 53 y.o. female  presenting for follow up for after hours call.   Patient called after hours line on 04/01/23 to discuss not feeling well. She had reported cough, chills and body aches. Patient was concerned due to starting new medications a few days before the onset of symptoms. She called the RN line one day later and reported her blood sugar readings were elevated. She was scheduled for follow up today.   04/03/23  She reports still not feeling well with continued aches in her arms and trunk. She has a cough that has lingered. She denies having fever or chills. She heard from her roommate that statins can cause her body aches and she stopped taking the medications on 04/01/23. She has not had any improvement of her symptoms.   She reports being on the same statin Lipitor many years ago and not having side effects with the drug.   PERTINENT  PMH / PSH: Reviewed and updated   OBJECTIVE:   BP (!) 142/85   Pulse 92   Ht 4\' 11"  (1.499 m)   Wt 209 lb 12.8 oz (95.2 kg)   SpO2 100%   BMI 42.37 kg/m   Well-appearing, no acute distress Cardio: Regular rate, regular rhythm, no murmurs on exam. Pulm: Clear, no wheezing, no crackles. No increased work of breathing Abdominal: bowel sounds present, soft, non-tender, non-distended   ASSESSMENT/PLAN:   HYPERTENSION, BENIGN ESSENTIAL Blood pressure not within goal today. Discussed discontinuing losartan-hydrochlorothiazide due to possible ARB side effects Start amlodipine 5 mg  Follow up in 2 weeks.   Hyperlipidemia Stop Lipitor due to patient's side effects and not feeling well. Consider restarting as patient can tolerate. Suspect that body aches and cough are not related to the medications and that she most likely had a viral URI (also c/w elevated blood sugars)  When discussing the plan above, the patient became frustrated. She reports that she is tired of doctors not  listening to her. I then asked what she would like to do with her medications due to her hesitancy in taking them from side effects. She began yelling and said that doctors should not accuse patient's of not wanting to take their medications. I re-explained why I thought she had her symptoms and attempted to engage her in shared decision making about her medications. Patient would not engage and requested provider leave the room, bring her papers to leave because "you are not trying to help me". Provider left the room and MA delivered AVS.  Will forward visit to clinic director Dr. Lum Babe and follow up provider Dr. Sherrilee Gilles.   [name], DO [facility]

## 2023-04-03 NOTE — Telephone Encounter (Signed)
 Patient contacted my voice mail following visit today as same day appointment for potential medication side effect.   Returned call to patient within ~ 20 minutes.  She was obviously upset, and expressed multiple times that she did not feel like she was heard today during her visit and did not have a chance to talk to anyone when she left to share her concerns about her visit.   Since visit on 3/20 patient reports a variety of symptoms which have made her feel bad.  She reports her sugar readings have increased to read "HI" all the time since starting her new medications.  The symptoms were variable and were possibly related to statin and losartan/hydrochlorothiazide use following the previous visit OR possibly another reason like viral illness.   Following discussion we agreed to stop both atorvastatin AND newly started losartan-hydrochlorothiazide to determine if symptom resolution is associated with stopping these treatments.  Additionally, patient agreed to a visit with me in ~ 5 days to re-evaluate how she was doing.  She was satisfied with the short-term treatment plan.  Anticipate re-challenge of both hypertension and cholesterol therapy, gradually.  Additionally, I agreed to ask someone in the practice to contact her within the next 24 hours to discuss her concerns with today's visit.  She accepted this offer.  I discussed a phone call follow-up on 3/28 with Jone Baseman, practice administrator, following the call.   Lastly, I briefly debriefed with Dr. Samara Deist on the visit. We discussed treatment and follow-up plans.    Total time with patient call and documentation of interaction: 27 minutes.  F/U Phone call planned by Jone Baseman on 3/28

## 2023-04-03 NOTE — Patient Instructions (Signed)
  Future Appointments  Date Time Provider Department Center  04/03/2023  5:00 PM [facility] 3 [facility]  04/15/2023  3:50 PM Lincoln Brigham, MD [facility]    Please arrive 15 minutes before your appointment to ensure smooth check in process.    Please call the clinic at [phone] if your symptoms worsen or you have any concerns.  Thank you for allowing me to participate in your care, Dr. [name] [facility]

## 2023-04-04 NOTE — Telephone Encounter (Signed)
 Reviewed

## 2023-04-07 NOTE — Telephone Encounter (Signed)
 Attempted to reach the patient. HIPAA compliant callback message left.  Margaret Larsen and I will give her a follow up call tomorrow if possible.

## 2023-04-08 ENCOUNTER — Ambulatory Visit: Admitting: Pharmacist

## 2023-04-08 ENCOUNTER — Encounter: Payer: Self-pay | Admitting: Family Medicine

## 2023-04-08 ENCOUNTER — Encounter: Payer: Self-pay | Admitting: Pharmacist

## 2023-04-08 VITALS — BP 118/77 | HR 95 | Wt 205.6 lb

## 2023-04-08 DIAGNOSIS — E785 Hyperlipidemia, unspecified: Secondary | ICD-10-CM

## 2023-04-08 DIAGNOSIS — E1140 Type 2 diabetes mellitus with diabetic neuropathy, unspecified: Secondary | ICD-10-CM

## 2023-04-08 DIAGNOSIS — I10 Essential (primary) hypertension: Secondary | ICD-10-CM

## 2023-04-08 DIAGNOSIS — E119 Type 2 diabetes mellitus without complications: Secondary | ICD-10-CM | POA: Diagnosis not present

## 2023-04-08 DIAGNOSIS — Z794 Long term (current) use of insulin: Secondary | ICD-10-CM | POA: Diagnosis not present

## 2023-04-08 MED ORDER — NOVOLOG FLEXPEN 100 UNIT/ML ~~LOC~~ SOPN: 50.0000 [IU] | PEN_INJECTOR | Freq: Every day | SUBCUTANEOUS | 3 refills | Status: DC

## 2023-04-08 MED ORDER — TRESIBA FLEXTOUCH 200 UNIT/ML ~~LOC~~ SOPN: 85.0000 [IU] | PEN_INJECTOR | Freq: Every day | SUBCUTANEOUS | 3 refills | Status: DC

## 2023-04-08 NOTE — Progress Notes (Signed)
 S:     Chief Complaint  Patient presents with   Medication Management    DM, HTN, and lipid f/u   53 y.o. female who presents for diabetes evaluation, education, and management. Patient arrives in fairly good spirits and presents without any assistance. Patient is accompanied by her son, [name].   Patient was referred and last seen by Primary Care Provider, Dr. Georg Ruddle, on 03/27/23.  Seen 04/03/2023 for follow-up of side effect of medication vs. Illness.  Stopped BP and Lipid therapy past visit.   PMH is significant for T2DM, HTN, GERD, dyslipidemia.  At last visit, stopped losartan-hydrochlorothiazide and atorvastatin due to side effect profile - patient reported myalgias and increased glucose levels.   Current diabetes medications include: insulin degludec Evaristo Bury) 80 units daily, insulin aspart (Novolog) 45 units daily with the largest meal Current hypertension medications include: none  Current hyperlipidemia medications include: none   Patient reports adherence to taking most medications as prescribed. Patient reports not taking amlodipine that was initiated upon last visit. Reports taking lower doses of insulin than prescribed.   Do you feel that your medications are working for you? yes Have you been experiencing any side effects to the medications prescribed? no Do you have any problems obtaining medications due to transportation or finances? no Insurance coverage: [place] Medicaid  Patient reports hypoglycemic events that occur when she is has not yet eaten for the day. Patient endorses low appetite, resulting in only eating 1 meal/day. Prior to eating, patient notices low glucose levels (<70) through [facility] app, and reports sweatiness during such events before she eats.  Reported home blood pressure levels: ~130-140/70-80. Patient reports feeling less stressed recently, resulting in lower at-home readings.  Patient reported dietary habits: Eats 1 meal/day (variable when she  eats) Snacks: yogurt Drinks: water, juice  Patient-reported exercise habits: walking   O:   Review of Systems  All other systems reviewed and are negative.   Physical Exam Vitals reviewed.  Constitutional:      Appearance: Normal appearance.  Pulmonary:     Effort: Pulmonary effort is normal.  Neurological:     Mental Status: She is alert.  Psychiatric:        Mood and Affect: Mood normal.        Behavior: Behavior normal.        Thought Content: Thought content normal.        Judgment: Judgment normal.    Libre3 CGM Download today 04/08/23 % Time CGM is active: 57% Average Glucose: 262 mg/dL Glucose Management Indicator: 9.6  Glucose Variability: 30.3% (goal <36%) Time in Goal:  - Time in range 70-180: 17% - Time above range: 83% - Time below range: 0% Observed patterns: Persistently elevated levels throughout the day   Lab Results  Component Value Date   HGBA1C 9.6 (A) 03/27/2023   Vitals:   04/08/23 1548  BP: 118/77  Pulse: 95  SpO2: 95%    Lipid Panel     Component Value Date/Time   CHOL 209 (H) 03/27/2023 1555   TRIG 100 03/27/2023 1555   HDL 54 03/27/2023 1555   CHOLHDL 3.9 03/27/2023 1555   CHOLHDL 4.6 05/19/2015 0933   VLDL 27 05/19/2015 0933   LDLCALC 137 (H) 03/27/2023 1555    Clinical Atherosclerotic Cardiovascular Disease (ASCVD): No  The 10-year ASCVD risk score (Arnett DK, et al., 2019) is: 8.5%   Values used to calculate the score:     Age: 29 years  Sex: Female     Is Non-Hispanic African American: Yes     Diabetic: Yes     Tobacco smoker: No     Systolic Blood Pressure: 118 mmHg     Is BP treated: Yes     HDL Cholesterol: 54 mg/dL     Total Cholesterol: 209 mg/dL   Patient is participating in a Managed Medicaid Plan:  Yes   A/P: Diabetes longstanding 15 years (2010) currently uncontrolled. Patient is able to verbalize appropriate hypoglycemia management plan. Medication adherence appears fair. Control is suboptimal due  to requiring increased dose of insulin. Patient reports goal weight of <200 lbs in the next 2-3 months. Patient motivated to increase exercise by walking after meal. -Increased dose of basal insulin Tresiba (insulin degludec) from 80 units to 85 units daily in the morning.  -Increased dose of rapid insulin Novolog (insulin aspart) from 45 to 50 units once daily with single  largest meal.  -Continued GLP-1 Ozempic (semaglutide) 2 mg weekly.  -Patient educated on purpose, proper use, and potential adverse effects.  -Extensively discussed pathophysiology of diabetes, recommended lifestyle interventions, dietary effects on blood sugar control.  -Counseled on s/sx of and management of hypoglycemia.  -Next A1c anticipated at planned upcoming PCP visit.   ASCVD risk - primary prevention in patient with diabetes. Last LDL is 137 not at goal of <16 mg/dL. ASCVD risk factors include T2DM, HTN, dyslipidemia and 10-year ASCVD risk score of 16%. High intensity statin indicated.  -Reevaluate potential restart of atorvastatin 40 mg at next PCP visit.  Unlikely previous visit with Dr. Hyacinth Meeker was that statin was causing nasal congestion and muscle aches.    Hypertension longstanding currently controlled on no BP medications. Blood pressure goal of <130/80 mmHg.  -Continued pausing all BP medications until reevaluation at PCP visit -Reevaluate with PCP visit  Discussed follow-up call for visit on 3/27. Informed patient that she should anticipate call from someone in office after 3pm tomorrow.   Written patient instructions provided. Patient verbalized understanding of treatment plan.  Total time in face to face counseling 38 minutes.    Follow-up:  Pharmacist PRN (may schedule mid-May dependent on reevaluation with PCP) PCP clinic visit in 05/09/23 Patient seen with Threasa Heads, PharmD Candidate.

## 2023-04-08 NOTE — Assessment & Plan Note (Signed)
 Hypertension longstanding currently controlled on no BP medications. Blood pressure goal of <130/80 mmHg.  -Continued pausing all BP medications until reevaluation at PCP visit -Reevaluate with PCP visit

## 2023-04-08 NOTE — Patient Instructions (Addendum)
 It was nice to see you today!  Your goal blood sugar is 80-130 before eating and less than 180 after eating.  Medication Changes: Increase Tresiba from 80 to 85 units daily in the morning.  Increase Novolog from 45 to 50 units daily with the largest meal.  Continue all other medication the same.   Keep up the good work with diet and exercise. Aim for a diet full of vegetables, fruit and lean meats (chicken, [place], fish). Try to limit salt intake by eating fresh or frozen vegetables (instead of canned), rinse canned vegetables prior to cooking and do not add any additional salt to meals.

## 2023-04-08 NOTE — Assessment & Plan Note (Signed)
 ASCVD risk - primary prevention in patient with diabetes. Last LDL is 137 not at goal of <16 mg/dL. ASCVD risk factors include T2DM, HTN, dyslipidemia and 10-year ASCVD risk score of 16%. High intensity statin indicated.  -Reevaluate potential restart of atorvastatin 40 mg at next PCP visit.  Unlikely previous visit with Dr. Hyacinth Meeker was that statin was causing nasal congestion and muscle aches.

## 2023-04-08 NOTE — Assessment & Plan Note (Signed)
 Diabetes longstanding 15 years (2010) currently uncontrolled. Patient is able to verbalize appropriate hypoglycemia management plan. Medication adherence appears fair. Control is suboptimal due to requiring increased dose of insulin. Patient reports goal weight of <200 lbs in the next 2-3 months. Patient motivated to increase exercise by walking after meal. -Increased dose of basal insulin Tresiba (insulin degludec) from 80 units to 85 units daily in the morning.  -Increased dose of rapid insulin Novolog (insulin aspart) from 45 to 50 units once daily with single  largest meal.  -Continued GLP-1 Ozempic (semaglutide) 2 mg weekly.  -Patient educated on purpose, proper use, and potential adverse effects.  -Extensively discussed pathophysiology of diabetes, recommended lifestyle interventions, dietary effects on blood sugar control.  -Counseled on s/sx of and management of hypoglycemia.  -Next A1c anticipated at planned upcoming PCP visit.

## 2023-04-09 ENCOUNTER — Telehealth: Payer: Self-pay

## 2023-04-09 DIAGNOSIS — E119 Type 2 diabetes mellitus without complications: Secondary | ICD-10-CM

## 2023-04-09 NOTE — Telephone Encounter (Signed)
 Margaret Larsen is requesting clarification on Tresiba whether the patient needs to inject 85 or 86 units a day. Please send the correct sig to the pharmacy, thanks!

## 2023-04-10 MED ORDER — TRESIBA FLEXTOUCH 200 UNIT/ML ~~LOC~~ SOPN: 85.0000 [IU] | PEN_INJECTOR | SUBCUTANEOUS | 3 refills | Status: DC

## 2023-04-10 NOTE — Progress Notes (Signed)
 Reviewed and agree with Dr Macky Lower plan.

## 2023-04-11 ENCOUNTER — Telehealth: Payer: Self-pay | Admitting: Family Medicine

## 2023-04-11 NOTE — Telephone Encounter (Signed)
 Reviewed and agree.

## 2023-04-11 NOTE — Telephone Encounter (Signed)
 Myself and Dr. McDiarmid attempted to reach patient regarding recent incident during clinic encounter with Dr. Hyacinth Meeker. HIPAA compliant callback message. This is the second callback attempt.

## 2023-04-14 ENCOUNTER — Other Ambulatory Visit: Payer: Self-pay | Admitting: Family Medicine

## 2023-04-15 ENCOUNTER — Ambulatory Visit: Payer: Self-pay | Admitting: Family Medicine

## 2023-04-17 ENCOUNTER — Telehealth: Payer: Self-pay

## 2023-04-17 NOTE — Telephone Encounter (Signed)
 Patient calls nurse line requesting a referral to Ophthalmology.   She reports she went to [facility] for routine eye exam. She reports they found "blood behind her eyes." She reports they referred her to [facility], however they do not take her insurance. She reports she called to get the referral sent elsewhere and they stated to call her PCP for assistance.   I called [employer] and requested more information. She reports the patient signed a two way consent and she will fax over the records for referral placement. However, before she is able to do this we will need to send a letter requesting the need for records.  Fax: [phone].

## 2023-04-18 ENCOUNTER — Other Ambulatory Visit (HOSPITAL_COMMUNITY): Payer: Self-pay

## 2023-04-22 ENCOUNTER — Encounter: Payer: Self-pay | Admitting: Family Medicine

## 2023-04-24 LAB — HM DIABETES EYE EXAM

## 2023-05-09 ENCOUNTER — Encounter: Payer: Self-pay | Admitting: Family Medicine

## 2023-05-09 ENCOUNTER — Ambulatory Visit (INDEPENDENT_AMBULATORY_CARE_PROVIDER_SITE_OTHER): Admitting: Family Medicine

## 2023-05-09 VITALS — BP 158/95 | HR 91 | Ht 59.0 in | Wt 208.0 lb

## 2023-05-09 DIAGNOSIS — Z794 Long term (current) use of insulin: Secondary | ICD-10-CM

## 2023-05-09 DIAGNOSIS — E119 Type 2 diabetes mellitus without complications: Secondary | ICD-10-CM | POA: Diagnosis present

## 2023-05-09 DIAGNOSIS — I10 Essential (primary) hypertension: Secondary | ICD-10-CM

## 2023-05-09 MED ORDER — AMLODIPINE BESYLATE 5 MG PO TABS: 5.0000 mg | ORAL_TABLET | Freq: Every day | ORAL | 0 refills | Status: DC

## 2023-05-09 MED ORDER — ROSUVASTATIN CALCIUM 5 MG PO TABS: 5.0000 mg | ORAL_TABLET | Freq: Every day | ORAL | 0 refills | Status: DC

## 2023-05-09 NOTE — Progress Notes (Signed)
    SUBJECTIVE:   CHIEF COMPLAINT / HPI:   Is doing 34 U in the AM for long acting and doing 45U for novolog  for one big meal. CBG have been in mid to low 100s. Denies any low episodes.   PERTINENT  PMH / PSH:  DMII  HTN Obesity    OBJECTIVE:   BP (!) 158/95   Pulse 91   Ht 4\' 11"  (1.499 m)   Wt 208 lb (94.3 kg)   SpO2 90%   BMI 42.01 kg/m   General: A&O, NAD HEENT: No sign of trauma, EOM grossly intact Cardiac: RRR, no m/r/g Respiratory: CTAB, normal WOB, no w/c/r GI: Soft, NTTP, non-distended  Extremities: NTTP, no peripheral edema. Bilateral feet without obvious ulcerations or lesions. Sensation intact bilaterally.  Neuro: Normal gait, moves all four extremities appropriately. Psych: Appropriate mood and affect   ASSESSMENT/PLAN:   Assessment & Plan Type 2 diabetes mellitus treated with insulin  (HCC) Recently increased dose to Tresiba  85 U in the AM and novolong 45U once with single largest meal with continued use of ozempic  2 mg weekly with Dr. Koval.  - continue current regimen for now: Tresiba  85U in AM and novolog  45U once daily with largest meal, ozempic  2 mg weekly. Saw eye doctor and has retinopathy. Was referred to eye specialist, still awaitingc call.  - discussed restarting statin. Patient is willing to trial after having muscle aches with last dose. Will restart rosuvastatin  5mg  daily  HYPERTENSION, BENIGN ESSENTIAL Patient has a history of HTN but is managed without medication control. At recent visit with Dr. Annabell Key, started on amlodipine  5mg  but patient did not start this medication because she wasn't sure if this was causing her muscle aches or the statin was.  BP today elevated but reports is not elevated at home. At last visit with Dr. Koval 118/77.  Patient unable to make appointment with Dr. Koval for amb BP cuff due to schedule. Will trial amlodipine . - Amlodipine  5 mg daily   Follow up in 1 month  [name], MD [facility]

## 2023-05-09 NOTE — Patient Instructions (Addendum)
 It was great to see you today! Thank you for choosing [facility] for your primary care. Margaret Larsen was seen for follow up.  Today we addressed: Your diabetes- lets continue on your current regimen and follow up in a months time.  I restarted your cholesterol medication but at a lower level I also restarted your blood pressure medications.  Please call your eye doctor about getting that referral to the eye specialist   If you haven't already, sign up for My Chart to have easy access to your labs results, and communication with your primary care physician.   You should return to our clinic Return in about 4 weeks (around 06/06/2023).  I recommend that you always bring your medications to each appointment as this makes it easy to ensure you are on the correct medications and helps us  not miss refills when you need them.  Please arrive 15 minutes before your appointment to ensure smooth check in process.  We appreciate your efforts in making this happen.  Please call the clinic at [phone] if your symptoms worsen or you have any concerns.  Thank you for allowing me to participate in your care, Margaret Naas, MD 05/09/2023, 3:39 PM PGY-1, [facility]

## 2023-05-09 NOTE — Assessment & Plan Note (Addendum)
 Recently increased dose to Tresiba  85 U in the AM and novolong 45U once with single largest meal with continued use of ozempic  2 mg weekly with Dr. Koval.  - continue current regimen for now: Tresiba  85U in AM and novolog  45U once daily with largest meal, ozempic  2 mg weekly. Saw eye doctor and has retinopathy. Was referred to eye specialist, still awaitingc call.  - discussed restarting statin. Patient is willing to trial after having muscle aches with last dose. Will restart rosuvastatin  5mg  daily

## 2023-05-09 NOTE — Assessment & Plan Note (Addendum)
 Patient has a history of HTN but is managed without medication control. At recent visit with Dr. Annabell Key, started on amlodipine  5mg  but patient did not start this medication because she wasn't sure if this was causing her muscle aches or the statin was.  BP today elevated but reports is not elevated at home. At last visit with Dr. Koval 118/77.  Patient unable to make appointment with Dr. Koval for amb BP cuff due to schedule. Will trial amlodipine . - Amlodipine  5 mg daily

## 2023-05-29 NOTE — Progress Notes (Signed)
 [facility] - Clinic Note  06/04/2023   CHIEF COMPLAINT Patient presents for Retina Evaluation  HISTORY OF PRESENT ILLNESS: Margaret Larsen is a 53 y.o. female who presents to the clinic today for:  HPI     Retina Evaluation   In both eyes.  I, the attending physician,  performed the HPI with the patient and updated documentation appropriately.        Comments   Patient here for Retina Evaluation. Referred  by [facility] for bleeding behind the eye  Patient states vision doing ok. No eye pain.  Not using drops.  Pt last reading 257 range A1C  8 range       Last edited by Ronelle Coffee, MD on 06/06/2023  3:08 AM.    Pt is here on the referral of Dr. Donalynn Fry at [facility] for NPDR OU, pts last A1c was 9.6 on 03.20.25, highest A1c: 11.7 on 04.01.24, pt states she is on insulin  and Ozempic , pt states she has stopped drinking soda and started walking to get her A1c down    Referring physician: Donalynn Fry, OD [address] [place],  [place] [address]  HISTORICAL INFORMATION:  Selected notes from the MEDICAL RECORD NUMBER Referred by Dr. Donalynn Fry for concern of NPDR OU LEE:  Ocular Hx- PMH-   CURRENT MEDICATIONS: No current outpatient medications on file. (Ophthalmic Drugs)   No current facility-administered medications for this visit. (Ophthalmic Drugs)   Current Outpatient Medications (Other)  Medication Sig   amLODipine  (NORVASC ) 5 MG tablet Take 1 tablet (5 mg total) by mouth at bedtime.   aspirin  81 MG EC tablet Take 81 mg by mouth 3 (three) times a week. Reported on 05/19/2015   Continuous Glucose Sensor (FREESTYLE LIBRE 3 PLUS SENSOR) MISC Change sensor every 15 days.   fluconazole  (DIFLUCAN ) 150 MG tablet TAKE 1 TABLET(150 MG) BY MOUTH 1 TIME FOR 1 DOSE   fluticasone  (FLONASE ) 50 MCG/ACT nasal spray Place 2 sprays into both nostrils daily.   insulin  aspart (NOVOLOG  FLEXPEN) 100 UNIT/ML FlexPen Inject 50 Units into the  skin daily.   insulin  degludec (TRESIBA  FLEXTOUCH) 200 UNIT/ML FlexTouch Pen Inject 86 Units into the skin daily.   rosuvastatin  (CRESTOR ) 5 MG tablet Take 1 tablet (5 mg total) by mouth daily.   Semaglutide , 2 MG/DOSE, (OZEMPIC , 2 MG/DOSE,) 8 MG/3ML SOPN Inject 2 mg into the skin once a week.   No current facility-administered medications for this visit. (Other)   REVIEW OF SYSTEMS: ROS   Positive for: Endocrine, Eyes Negative for: Constitutional, Gastrointestinal, Neurological, Skin, Genitourinary, Musculoskeletal, HENT, Cardiovascular, Respiratory, Psychiatric, Allergic/Imm, Heme/Lymph Last edited by Kriste Petite, COT on 06/04/2023  3:08 PM.     ALLERGIES Allergies  Allergen Reactions   Metformin  And Related Hives and Diarrhea    Unable to tolerate   Peanut-Containing Drug Products Shortness Of Breath and Swelling    mouth swelling   Insulin  Glargine Itching and Rash   Jardiance  [Empagliflozin ] Hives and Itching    Denies airway involvment   Rosuvastatin  Calcium  Nausea Only   Codeine Other (See Comments)    Patient stated that it slowed her heart rate, shortness of breath   Diphenhydramine Hcl Hives and Rash   Fexofenadine-Pseudoephed Er Other (See Comments)    Patient stated that is slowed her heart rate   Latuda [Lurasidone Hcl] Other (See Comments)    Myalgias all over, most notably in her arms and legs.  Shellfish Allergy Itching   PAST MEDICAL HISTORY Past Medical History:  Diagnosis Date   Cough 05/13/2016   Diabetes mellitus    Hemorrhoids 07/10/2010   Hypertension    Kidney stones    Kidney stones    Myalgia and myositis 06/19/2015   Renal disorder    Past Surgical History:  Procedure Laterality Date   CESAREAN SECTION     CHOLECYSTECTOMY     fiborids removed     TUBAL LIGATION     FAMILY HISTORY Family History  Problem Relation Age of Onset   Hypertension Mother    Diabetes Mother    Cancer Mother    SOCIAL HISTORY Social History   Tobacco Use    Smoking status: Never   Smokeless tobacco: Never  Vaping Use   Vaping status: Never Used  Substance Use Topics   Alcohol use: No   Drug use: No       OPHTHALMIC EXAM:  Base Eye Exam     Visual Acuity (Snellen - Linear)       Right Left   Dist cc 20/70 -2 20/30 -1   Dist ph cc 20/40 NI    Correction: Contacts         Tonometry (Tonopen, 2:16 PM)       Right Left   Pressure 21 20         Pupils       Dark Light Shape React APD   Right 3 2 Round Brisk None   Left 3 2 Round Brisk None         Visual Fields (Counting fingers)       Left Right    Full Full         Extraocular Movement       Right Left    Full, Ortho Full, Ortho         Neuro/Psych     Oriented x3: Yes   Mood/Affect: Normal         Dilation     Both eyes: 1.0% Mydriacyl, 2.5% Phenylephrine @ 2:16 PM           Slit Lamp and Fundus Exam     Slit Lamp Exam       Right Left   Lids/Lashes Dermatochalasis - upper lid Dermatochalasis - upper lid   Conjunctiva/Sclera Melanosis mild melanosis   Cornea 1+ Punctate epithelial erosions, tear film debris 1+ Punctate epithelial erosions, tear film debris   Anterior Chamber deep and clear deep and clear   Iris Round and dilated, No NVI Round and dilated, No NVI   Lens 1-2+ Cortical cataract 1-2+ Cortical cataract   Anterior Vitreous mild syneresis mild syneresis         Fundus Exam       Right Left   Disc trace Pallor, +PPP, mild tilt mild Pallor, Sharp rim, mild PPP   C/D Ratio 0.65 0.5   Macula Flat, Blunted foveal reflex, scattered MA / DBH, cluster of DBH temporal macula Good foveal reflex, scattered MA / DBH, cluster of exudates and edema temporal to fovea   Vessels attenuated, Tortuous, no NV attenuated, Tortuous, no NV   Periphery Attached, scattered MA / DBH greatest posteriorly, pigmented paving stone degeneration inferiorly Attached, rare MA / DBH           Refraction     Wearing Rx       Sphere    Right -10.00   Left -8.00  Manifest Refraction       Sphere Cylinder Axis Dist VA   Right +16.50 +3.25 005 20/70-3   Left      Unable to correct os any better           IMAGING AND PROCEDURES  Imaging and Procedures for 06/04/2023  OCT, Retina - OU - Both Eyes        Right Eye Quality was good. Central Foveal Thickness: 305. Progression has no prior data. Findings include no SRF, myopic contour, intraretinal hyper-reflective material, intraretinal fluid (Severely myopic contour, blunted foveal contour, scattered focal cystic changes greatest IT macula).   Left Eye Quality was good. Central Foveal Thickness: 283. Progression has no prior data. Findings include normal foveal contour, no SRF, myopic contour, intraretinal hyper-reflective material, intraretinal fluid, vitreomacular adhesion (Focal cystic changes / edema temporal macula).   Notes  *Images captured and stored on drive  Diagnosis / Impression:  OD: Severely myopic contour, blunted foveal contour, scattered focal cystic changes greatest IT macula OS: Focal cystic changes / edema temporal macula  Clinical management:  See below  Abbreviations: NFP - Normal foveal profile. CME - cystoid macular edema. PED - pigment epithelial detachment. IRF - intraretinal fluid. SRF - subretinal fluid. EZ - ellipsoid zone. ERM - epiretinal membrane. ORA - outer retinal atrophy. ORT - outer retinal tubulation. SRHM - subretinal hyper-reflective material. IRHM - intraretinal hyper-reflective material      Fluorescein Angiography Optos (Transit OD)        Right Eye Progression has no prior data. Early phase findings include microaneurysm, vascular perfusion defect. Mid/Late phase findings include leakage, microaneurysm, vascular perfusion defect (Mild scattered vascular perfusion defects, scattered leaking MA, no NV).   Left Eye Progression has no prior data. Early phase findings include microaneurysm, vascular  perfusion defect. Mid/Late phase findings include leakage, microaneurysm, vascular perfusion defect (Mild scattered vascular perfusion defects, scattered leaking MA, no NV).   Notes  **Images stored on drive**  Impression: Mild scattered vascular perfusion defects, scattered leaking MA, no NV OU           ASSESSMENT/PLAN:   ICD-10-CM   1. Moderate nonproliferative diabetic retinopathy of both eyes with macular edema associated with type 2 diabetes mellitus (HCC)  E11.3313 OCT, Retina - OU - Both Eyes    Fluorescein Angiography Optos (Transit OD)    2. Current use of insulin  (HCC)  Z79.4     3. Long-term (current) use of injectable non-insulin  antidiabetic drugs  Z79.85     4. Essential hypertension  I10     5. Hypertensive retinopathy of both eyes  H35.033 Fluorescein Angiography Optos (Transit OD)    6. Cortical cataract of both eyes  H26.9      1-3.Moderate Non-proliferative diabetic retinopathy, both eyes  - last A1c 9.6% on 03.20.25 (max 11.7% on 04.01.24) - The incidence, risk factors for progression, natural history and treatment options for diabetic retinopathy were discussed with patient.   - The need for close monitoring of blood glucose, blood pressure, and serum lipids, avoiding cigarette or any type of tobacco, and the need for long term follow up was also discussed with patient. - exam shows scattered MA/DBH OU - FA (05.25.25) shows Mild scattered vascular perfusion defects, scattered leaking MA, no NV OU - OCT shows mild diabetic macular edema, both eyes  - The natural history, pathology, and characteristics of diabetic macular edema discussed with patient.  A generalized discussion of the major clinical trials concerning treatment of diabetic macular  edema (ETDRS, DCT, SCORE, RISE / RIDE, and ongoing DRCR net studies) was completed.  This discussion included mention of the various approaches to treating diabetic macular edema (observation, laser photocoagulation,  anti-VEGF injections with lucentis / Avastin / Eylea, steroid injections with Kenalog  / Ozurdex , and intraocular surgery with vitrectomy).  The goal hemoglobin A1C of 6-7 was discussed, as well as importance of smoking cessation and hypertension control.  Need for ongoing treatment and monitoring were specifically discussed with reference to chronic nature of diabetic macular edema. - recommend holding off on intervention at this time -- pt in agreement - f/u in 3 months -- DFE/OCT, possible injection  4,5. Hypertensive retinopathy OU - discussed importance of tight BP control - monitor  6. Cortical Cataract OU - The symptoms of cataract, surgical options, and treatments and risks were discussed with patient. - discussed diagnosis and progression - not yet visually significant - monitor for now  Ophthalmic Meds Ordered this visit:  No orders of the defined types were placed in this encounter.    Return in about 3 months (around 09/04/2023) for f/u NPDR OU, DFE, OCT.  There are no Patient Instructions on file for this visit.  Explained the diagnoses, plan, and follow up with the patient and they expressed understanding.  Patient expressed understanding of the importance of proper follow up care.   Jeanice Millard, M.D., Ph.D. Diseases & Surgery of the Retina and Vitreous [facility] 06/04/2023  I have reviewed the above documentation for accuracy and completeness, and I agree with the above. Jeanice Millard, M.D., Ph.D. 06/06/23 3:11 AM   Abbreviations: M myopia (nearsighted); A astigmatism; H hyperopia (farsighted); P presbyopia; Mrx spectacle prescription;  CTL contact lenses; OD right eye; OS left eye; OU both eyes  XT exotropia; ET esotropia; PEK punctate epithelial keratitis; PEE punctate epithelial erosions; DES dry eye syndrome; MGD meibomian gland dysfunction; ATs artificial tears; PFAT's preservative free artificial tears; NSC nuclear sclerotic cataract;  PSC posterior subcapsular cataract; ERM epi-retinal membrane; PVD posterior vitreous detachment; RD retinal detachment; DM diabetes mellitus; DR diabetic retinopathy; NPDR non-proliferative diabetic retinopathy; PDR proliferative diabetic retinopathy; CSME clinically significant macular edema; DME diabetic macular edema; dbh dot blot hemorrhages; CWS cotton wool spot; POAG primary open angle glaucoma; C/D cup-to-disc ratio; HVF humphrey visual field; GVF goldmann visual field; OCT optical coherence tomography; IOP intraocular pressure; BRVO Branch retinal vein occlusion; CRVO central retinal vein occlusion; CRAO central retinal artery occlusion; BRAO branch retinal artery occlusion; RT retinal tear; SB scleral buckle; PPV pars plana vitrectomy; VH Vitreous hemorrhage; PRP panretinal laser photocoagulation; IVK intravitreal kenalog ; VMT vitreomacular traction; MH Macular hole;  NVD neovascularization of the disc; NVE neovascularization elsewhere; AREDS age related eye disease study; ARMD age related macular degeneration; POAG primary open angle glaucoma; EBMD epithelial/anterior basement membrane dystrophy; ACIOL anterior chamber intraocular lens; IOL intraocular lens; PCIOL posterior chamber intraocular lens; Phaco/IOL phacoemulsification with intraocular lens placement; PRK photorefractive keratectomy; LASIK laser assisted in situ keratomileusis; HTN hypertension; DM diabetes mellitus; COPD chronic obstructive pulmonary disease

## 2023-05-30 ENCOUNTER — Telehealth: Payer: Self-pay

## 2023-05-30 NOTE — Telephone Encounter (Signed)
 Pharmacy Patient Advocate Encounter  Received notification from [facility] MEDICAID that Prior Authorization for FREESTYLE LIBRE 3 SENSOR has been APPROVED from 05/30/23 to /23/26   PA #/Case ID/Reference #: [id number]

## 2023-05-30 NOTE — Telephone Encounter (Signed)
 Pharmacy Patient Advocate Encounter   Received notification from CoverMyMeds that prior authorization for Freestyle Libre 3 Sensor is required/requested.   Insurance verification completed.   The patient is insured through [facility] MEDICAID .  PA required; PA submitted to above mentioned insurance via CoverMyMeds Key/confirmation #/EOC [id number]. Status is pending

## 2023-06-04 ENCOUNTER — Ambulatory Visit (INDEPENDENT_AMBULATORY_CARE_PROVIDER_SITE_OTHER): Admitting: Ophthalmology

## 2023-06-04 ENCOUNTER — Encounter (INDEPENDENT_AMBULATORY_CARE_PROVIDER_SITE_OTHER): Payer: Self-pay | Admitting: Ophthalmology

## 2023-06-04 VITALS — BP 150/91 | HR 91

## 2023-06-04 DIAGNOSIS — H3581 Retinal edema: Secondary | ICD-10-CM

## 2023-06-04 DIAGNOSIS — E113313 Type 2 diabetes mellitus with moderate nonproliferative diabetic retinopathy with macular edema, bilateral: Secondary | ICD-10-CM

## 2023-06-04 DIAGNOSIS — Z7985 Long-term (current) use of injectable non-insulin antidiabetic drugs: Secondary | ICD-10-CM

## 2023-06-04 DIAGNOSIS — H269 Unspecified cataract: Secondary | ICD-10-CM

## 2023-06-04 DIAGNOSIS — I10 Essential (primary) hypertension: Secondary | ICD-10-CM

## 2023-06-04 DIAGNOSIS — Z794 Long term (current) use of insulin: Secondary | ICD-10-CM

## 2023-06-04 DIAGNOSIS — H35033 Hypertensive retinopathy, bilateral: Secondary | ICD-10-CM

## 2023-06-06 ENCOUNTER — Encounter (INDEPENDENT_AMBULATORY_CARE_PROVIDER_SITE_OTHER): Payer: Self-pay | Admitting: Ophthalmology

## 2023-06-12 ENCOUNTER — Ambulatory Visit (INDEPENDENT_AMBULATORY_CARE_PROVIDER_SITE_OTHER): Admitting: Family Medicine

## 2023-06-12 ENCOUNTER — Encounter: Payer: Self-pay | Admitting: Family Medicine

## 2023-06-12 VITALS — BP 129/93 | HR 86 | Ht 59.0 in | Wt 209.0 lb

## 2023-06-12 DIAGNOSIS — M7989 Other specified soft tissue disorders: Secondary | ICD-10-CM | POA: Diagnosis not present

## 2023-06-12 DIAGNOSIS — Z794 Long term (current) use of insulin: Secondary | ICD-10-CM | POA: Diagnosis not present

## 2023-06-12 DIAGNOSIS — E119 Type 2 diabetes mellitus without complications: Secondary | ICD-10-CM | POA: Diagnosis present

## 2023-06-12 DIAGNOSIS — I10 Essential (primary) hypertension: Secondary | ICD-10-CM

## 2023-06-12 LAB — POCT GLYCOSYLATED HEMOGLOBIN (HGB A1C): HbA1c, POC (controlled diabetic range): 8.7 % — AB (ref 0.0–7.0)

## 2023-06-12 MED ORDER — AMLODIPINE BESYLATE 2.5 MG PO TABS: 2.5000 mg | ORAL_TABLET | Freq: Every day | ORAL | 0 refills | Status: DC

## 2023-06-12 NOTE — Assessment & Plan Note (Signed)
 Is having some dizziness throughout the day since starting norvasc  5 mg. Patient has been having increased blood pressures for some time, suspect dizziness may be related to this. Patient denies any falls. Patient unable to do ambulatory monitoring due to work schedule. Will decrease to 2.5 mg for now and slow titrate up. Reassuringly, her BP has improved since last visit.  - Will discuss this with Dr. Koval at 1 month follow up. - 2 month follow up

## 2023-06-12 NOTE — Progress Notes (Addendum)
 SUBJECTIVE:   CHIEF COMPLAINT / HPI:   Patient is a 53 y.o. female who present today for diabetic follow up.   Patient endorses no problems  Home medications include: 85U of tresiba  in AM and novolog  45 U with single largest meal, ozempic  2 mg weekly.  Patient endorses taking these medications as prescribed.  Patient does check blood glucose on a regular basis.  Patient is up to date on diabetic eye. Patient is up to date on diabetic foot exam.   HTN:   Blood pressure pill 5 mg norvasc   Hypertension ROS: taking medications as instructed, medication side effects include: dizziness all throughout the day but not when she first wakes up or related to standing  , no TIA's, no chest pain on exertion, no dyspnea on exertion, no swelling of ankles, and no palpitations.   Forearm swelling Bilateral forearm swelling that started a few months ago. Not painful at first but started noticing the swelling on left forearm was worse the last month or so with associated pain. Reports the pain starts at the forearm and moves up. Denies any numbness or tingling, reports its "just pain".Denies any trauma to these areas. Also noted a small area of swelling on her left heel a few days ago.   PERTINENT  PMH / PSH:   OBJECTIVE:   BP (!) 129/93   Pulse 86   Ht 4\' 11"  (1.499 m)   Wt 209 lb (94.8 kg)   SpO2 95%   BMI 42.21 kg/m   Vitals:   06/12/23 1535 06/12/23 1608  BP: (!) 142/82 (!) 129/93  General: A&O, NAD HEENT: No sign of trauma, EOM grossly intact Cardiac: RRR, no m/r/g Respiratory: CTAB, normal WOB, no w/c/r GI: obese, soft, normoactive bowel sounds  Extremities: no peripheral edema noted. Bilateral area of swelling to forearms, approx. 3 inches x 2 inches. Soft, no obvious masses felt. No warmth to touch and no erythema. Small mobile, soft mass noted behind left heel.    Neuro: Normal gait, moves all four extremities appropriately. Psych: Appropriate mood and  affect   ASSESSMENT/PLAN:   Assessment & Plan Type 2 diabetes mellitus treated with insulin  (HCC) A1c today improved from prior. 9.6 > 8.7. Able to quickly review data from [place] app. Patient is having some high blood sugars >300, but not related to specific time of day, and then some lower sugars around mid-low 100s. Given she has been difficult to control, will have her follow up with Dr. Koval prior to any more adjustment to medications.  - A1C today - Urine Microalbumin/ Cr collected  - 1 month follow up with Dr. Alice Innocent HYPERTENSION, BENIGN ESSENTIAL Is having some dizziness throughout the day since starting norvasc  5 mg. Patient has been having increased blood pressures for some time, suspect dizziness may be related to this. Patient denies any falls. Patient unable to do ambulatory monitoring due to work schedule. Will decrease to 2.5 mg for now and slow titrate up. Reassuringly, her BP has improved since last visit.  - Will discuss this with Dr. Koval at 1 month follow up. - 2 month follow up  Forearm swelling Patient with bilateral swelling on forearms. No obvious masses noted, area is soft. Suspect she may have some lipid deposition in these areas. Unclear cause, but perhaps related to insulin  use. Given she is now having some painful symptoms, will order bilateral ultrasound to better characterize. Low suspicion for DVT or other acute process requiring immediate intervention given area  is nontender and without erythema or warmth.   1 month follow up    [name], MD [facility]

## 2023-06-12 NOTE — Assessment & Plan Note (Addendum)
 A1c today improved from prior. 9.6 > 8.7. Able to quickly review data from [place] app. Patient is having some high blood sugars >300, but not related to specific time of day, and then some lower sugars around mid-low 100s. Given she has been difficult to control, will have her follow up with Dr. Koval prior to any more adjustment to medications.  - A1C today - Urine Microalbumin/ Cr collected  - 1 month follow up with Dr. Koval

## 2023-06-12 NOTE — Patient Instructions (Addendum)
 It was wonderful to see you today.  Please bring ALL of your medications with you to every visit.   Today we talked about:  Your blood pressure- I am decreasing this dose to 2.5 mg for the next month. Cut your pill in half until you are able to pick up the new prescription. Your diabetes: your A1C is better but you are still having high glucoses. I want you to follow up with Dr. Koval in the next month. Your arm swelling: sometimes you can have fat deposits from insulin  use/ medication use. I am getting an ultrasound of both arms to make sure there isn't a mass present. I will call you with these results.   Thank you for choosing [facility].   Please call [phone] with any questions about today's appointment.  Please arrive at least 15 minutes prior to your scheduled appointments.   If you had blood work today, I will send you a MyChart message or a letter if results are normal. Otherwise, I will give you a call.   If you had a referral placed, they will call you to set up an appointment. Please give us  a call if you don't hear back in the next 2 weeks.   If you need additional refills before your next appointment, please call your pharmacy first.   You should follow up in our clinic in one month for follow up with Dr. Koval.   [name], MD  Family Medicine

## 2023-06-14 LAB — MICROALBUMIN / CREATININE URINE RATIO
Creatinine, Urine: 194.5 mg/dL
Microalb/Creat Ratio: 18 mg/g{creat} (ref 0–29)
Microalbumin, Urine: 34.5 ug/mL

## 2023-06-16 ENCOUNTER — Ambulatory Visit (HOSPITAL_COMMUNITY): Admission: RE | Admit: 2023-06-16 | Source: Ambulatory Visit

## 2023-06-16 ENCOUNTER — Ambulatory Visit (HOSPITAL_COMMUNITY): Attending: Family Medicine

## 2023-06-16 ENCOUNTER — Ambulatory Visit: Payer: Self-pay | Admitting: Family Medicine

## 2023-07-07 ENCOUNTER — Other Ambulatory Visit: Payer: Self-pay | Admitting: Family Medicine

## 2023-07-07 DIAGNOSIS — I10 Essential (primary) hypertension: Secondary | ICD-10-CM

## 2023-07-12 ENCOUNTER — Other Ambulatory Visit: Payer: Self-pay | Admitting: Family Medicine

## 2023-07-12 DIAGNOSIS — Z794 Long term (current) use of insulin: Secondary | ICD-10-CM

## 2023-07-15 ENCOUNTER — Ambulatory Visit: Admitting: Pharmacist

## 2023-07-29 ENCOUNTER — Encounter: Payer: Self-pay | Admitting: Pharmacist

## 2023-08-04 ENCOUNTER — Encounter: Payer: Self-pay | Admitting: Pharmacist

## 2023-08-12 ENCOUNTER — Telehealth: Payer: Self-pay

## 2023-08-12 ENCOUNTER — Other Ambulatory Visit (HOSPITAL_COMMUNITY): Payer: Self-pay

## 2023-08-12 ENCOUNTER — Telehealth: Payer: Self-pay | Admitting: Pharmacist

## 2023-08-12 NOTE — Telephone Encounter (Signed)
 Pharmacy Patient Advocate Encounter   Received notification from CoverMyMeds that prior authorization for TRESIBA  is required/requested.   Insurance verification completed.   The patient is insured through [facility] MEDICAID .   PA required; PA submitted to above mentioned insurance via CoverMyMeds Key/confirmation #/EOC [id number]. Status is pending

## 2023-08-12 NOTE — Telephone Encounter (Signed)
 Attempted to contact patient for follow-up of CGM control of Diabetes and missed appointment.  Patient with history of poorly controlled diabetes.  Now appears greatly improved. Review of CGM shows greatly improved glucose control.  GMI = 7.0 Avg glucose of 155   NEEDS new A1C at next  evaluation/visit.   Left HIPAA compliant voice mail requesting call back to direct phone: [phone] Asked patient to return call to discuss disease control OR to reschedule if unable to do so through the front desk, main contact phone #.   Total time with patient call and documentation of interaction: 9 minutes.  Follow-up phone call planned: None

## 2023-08-13 NOTE — Telephone Encounter (Signed)
 Pharmacy Patient Advocate Encounter  Received notification from [facility] MEDICAID that Prior Authorization for TRESIBA  has been DENIED.  N/A

## 2023-08-14 NOTE — Telephone Encounter (Signed)
 Reviewed and agree with Dr Macky Lower plan.

## 2023-08-15 ENCOUNTER — Telehealth: Payer: Self-pay | Admitting: Pharmacist

## 2023-08-15 NOTE — Telephone Encounter (Signed)
 Reviewed and agree with Dr Macky Lower plan.

## 2023-08-15 NOTE — Telephone Encounter (Signed)
 Patient attempted contact for follow-up of missed Appointment AND insulin  supply issue.  Patient did not answer and I left message requesting call back.   Contacted her pharmacy AND was able to resolve the Tresiba  insulin  supply issue with an override.   Again contacted patient and left Voice Mail describing plan for insulin  to be available in short-supply (single pen) today AND full supply on Monday (if she can wait until Monday she can pick up full prescription at that time). Also requested patient to return call to schedule follow-up appointment.    Total time with patient call and documentation of interaction: 23 minutes.

## 2023-08-15 NOTE — Telephone Encounter (Signed)
 Patient returned call and asked me to clarify if 1 pen was adequate to wait until Monday.   At 86 units daily with Tresiba  200units/ml she will have enough for the weekend.

## 2023-09-02 NOTE — Progress Notes (Signed)
 [facility] - Clinic Note  09/03/2023   CHIEF COMPLAINT Patient presents for Retina Follow Up  HISTORY OF PRESENT ILLNESS: Margaret Larsen is a 53 y.o. female who presents to the clinic today for:  HPI     Retina Follow Up   Patient presents with  Diabetic Retinopathy.  In both eyes.  This started 3 months ago.  Severity is moderate.  Duration of 3 months.  I, the attending physician,  performed the HPI with the patient and updated documentation appropriately.        Comments   Pt denies changes in vision/FOL/floaters/pain. Pt does not use ats. A1c=8 something BS= 69 fasting 3:03pm (now)        Last edited by Valdemar Rogue, MD on 09/03/2023 10:35 PM.     Pt states   Referring physician: Lonnie Earnest, MD [address] [place],  [place] [address]  HISTORICAL INFORMATION:  Selected notes from the MEDICAL RECORD NUMBER Referred by Dr. Nanetta Sharps for concern of NPDR OU LEE:  Ocular Hx- PMH-   CURRENT MEDICATIONS: No current outpatient medications on file. (Ophthalmic Drugs)   No current facility-administered medications for this visit. (Ophthalmic Drugs)   Current Outpatient Medications (Other)  Medication Sig   amLODipine  (NORVASC ) 2.5 MG tablet Take 1 tablet (2.5 mg total) by mouth at bedtime.   amLODipine  (NORVASC ) 5 MG tablet TAKE 1 TABLET(5 MG) BY MOUTH AT BEDTIME   aspirin  81 MG EC tablet Take 81 mg by mouth 3 (three) times a week. Reported on 05/19/2015   Continuous Glucose Sensor (FREESTYLE LIBRE 3 PLUS SENSOR) MISC Change sensor every 15 days.   fluconazole  (DIFLUCAN ) 150 MG tablet TAKE 1 TABLET(150 MG) BY MOUTH 1 TIME FOR 1 DOSE   fluticasone  (FLONASE ) 50 MCG/ACT nasal spray Place 2 sprays into both nostrils daily.   insulin  aspart (NOVOLOG  FLEXPEN) 100 UNIT/ML FlexPen Inject 50 Units into the skin daily.   insulin  degludec (TRESIBA  FLEXTOUCH) 200 UNIT/ML FlexTouch Pen Inject 86 Units into the skin daily.   OZEMPIC , 2 MG/DOSE, 8  MG/3ML SOPN INJECT 2 MG UNDER THE SKIN ONCE A WEEK   rosuvastatin  (CRESTOR ) 5 MG tablet Take 1 tablet (5 mg total) by mouth daily.   No current facility-administered medications for this visit. (Other)   REVIEW OF SYSTEMS: ROS   Positive for: Endocrine, Eyes Negative for: Constitutional, Gastrointestinal, Neurological, Skin, Genitourinary, Musculoskeletal, HENT, Cardiovascular, Respiratory, Psychiatric, Allergic/Imm, Heme/Lymph Last edited by Elnor Avelina RAMAN, COT on 09/03/2023  3:03 PM.      ALLERGIES Allergies  Allergen Reactions   Metformin  And Related Hives and Diarrhea    Unable to tolerate   Peanut-Containing Drug Products Shortness Of Breath and Swelling    mouth swelling   Insulin  Glargine Itching and Rash   Jardiance  [Empagliflozin ] Hives and Itching    Denies airway involvment   Rosuvastatin  Calcium  Nausea Only   Codeine Other (See Comments)    Patient stated that it slowed her heart rate, shortness of breath   Diphenhydramine Hcl Hives and Rash   Fexofenadine-Pseudoephed Er Other (See Comments)    Patient stated that is slowed her heart rate   Latuda [Lurasidone Hcl] Other (See Comments)    Myalgias all over, most notably in her arms and legs.   Shellfish Allergy Itching   PAST MEDICAL HISTORY Past Medical History:  Diagnosis Date   Cough 05/13/2016   Diabetes mellitus    Hemorrhoids 07/10/2010   Hypertension    Kidney stones  Kidney stones    Myalgia and myositis 06/19/2015   Renal disorder    Past Surgical History:  Procedure Laterality Date   CESAREAN SECTION     CHOLECYSTECTOMY     fiborids removed     TUBAL LIGATION     FAMILY HISTORY Family History  Problem Relation Age of Onset   Hypertension Mother    Diabetes Mother    Cancer Mother    SOCIAL HISTORY Social History   Tobacco Use   Smoking status: Never   Smokeless tobacco: Never  Vaping Use   Vaping status: Never Used  Substance Use Topics   Alcohol use: No   Drug use: No        OPHTHALMIC EXAM:  Base Eye Exam     Visual Acuity (Snellen - Linear)       Right Left   Dist cc 20/80 +2 20/50 -2   Dist ph cc NI 20/30 +2    Correction: Contacts         Tonometry (Tonopen, 3:10 PM)       Right Left   Pressure 20 16         Pupils       Pupils Dark Light Shape React APD   Right PERRL 3 2 Round Brisk None   Left PERRL 3 2 Round Brisk None         Visual Fields       Left Right    Full Full         Extraocular Movement       Right Left    Full, Ortho Full, Ortho         Neuro/Psych     Oriented x3: Yes   Mood/Affect: Normal         Dilation     Both eyes: 1.0% Mydriacyl, 2.5% Phenylephrine @ 3:11 PM           Slit Lamp and Fundus Exam     Slit Lamp Exam       Right Left   Lids/Lashes Dermatochalasis - upper lid Dermatochalasis - upper lid   Conjunctiva/Sclera Melanosis mild melanosis   Cornea 1+ Punctate epithelial erosions, tear film debris 1+ Punctate epithelial erosions, tear film debris   Anterior Chamber deep and clear deep and clear   Iris Round and dilated, No NVI Round and dilated, No NVI   Lens 1-2+ Cortical cataract 1-2+ Cortical cataract   Anterior Vitreous mild syneresis mild syneresis         Fundus Exam       Right Left   Disc trace Pallor, +PPP, mild tilt mild Pallor, Sharp rim, mild PPP   C/D Ratio 0.65 0.5   Macula Flat, Blunted foveal reflex, scattered MA / DBH, cluster of DBH temporal macula, + cystic changes temporal macula - increased Good foveal reflex, scattered MA / DBH, cluster of exudates and edema temporal to fovea-- slightly improved   Vessels attenuated, Tortuous, no NV attenuated, Tortuous, no NV   Periphery Attached, scattered MA / DBH greatest posteriorly, pigmented paving stone degeneration inferiorly Attached, rare MA / DBH           IMAGING AND PROCEDURES  Imaging and Procedures for 09/03/2023  OCT, Retina - OU - Both Eyes       Right Eye Quality was good. Central  Foveal Thickness: 335. Progression has worsened. Findings include normal foveal contour, no SRF, myopic contour, intraretinal hyper-reflective material, intraretinal fluid (Severely myopic contour, blunted foveal contour,  scattered focal cystic changes greatest IT macula-- slightly increased).   Left Eye Quality was good. Central Foveal Thickness: 289. Progression has improved. Findings include normal foveal contour, no SRF, myopic contour, intraretinal hyper-reflective material, intraretinal fluid, vitreomacular adhesion (Focal cystic changes / edema temporal macula-- slightly improved).   Notes *Images captured and stored on drive  Diagnosis / Impression:  OD: Severely myopic contour, blunted foveal contour, scattered focal cystic changes greatest IT macula-- slightly increased OS: Focal cystic changes / edema temporal macula-- slightly improved  Clinical management:  See below  Abbreviations: NFP - Normal foveal profile. CME - cystoid macular edema. PED - pigment epithelial detachment. IRF - intraretinal fluid. SRF - subretinal fluid. EZ - ellipsoid zone. ERM - epiretinal membrane. ORA - outer retinal atrophy. ORT - outer retinal tubulation. SRHM - subretinal hyper-reflective material. IRHM - intraretinal hyper-reflective material      Intravitreal Injection, Pharmacologic Agent - OD - Right Eye       Time Out 09/03/2023. 4:00 PM. Confirmed correct patient, procedure, site, and patient consented.   Anesthesia Topical anesthesia was used. Anesthetic medications included Lidocaine  2%, Proparacaine 0.5%.   Procedure Preparation included 5% betadine to ocular surface, eyelid speculum. A (32g) needle was used.   Injection: 1.25 mg Bevacizumab  1.25mg /0.50ml   Route: Intravitreal, Site: Right Eye   NDC: [id number], Lot: [id number], Expiration date: 10/09/2023   Post-op Post injection exam found visual acuity of at least counting fingers. The patient tolerated the procedure well. There  were no complications. The patient received written and verbal post procedure care education.            ASSESSMENT/PLAN:   ICD-10-CM   1. Moderate nonproliferative diabetic retinopathy of both eyes with macular edema associated with type 2 diabetes mellitus (HCC)  E11.3313 OCT, Retina - OU - Both Eyes    Intravitreal Injection, Pharmacologic Agent - OD - Right Eye    Bevacizumab  (AVASTIN ) SOLN 1.25 mg    2. Current use of insulin  (HCC)  Z79.4     3. Long-term (current) use of injectable non-insulin  antidiabetic drugs  Z79.85     4. Essential hypertension  I10     5. Hypertensive retinopathy of both eyes  H35.033     6. Cortical cataract of both eyes  H26.9      1-3.  Moderate Non-proliferative diabetic retinopathy, both eyes  - last A1c 8.7 (06.05.25), 9.6% (03.20.25) (max 11.7% on 04.01.24) - The incidence, risk factors for progression, natural history and treatment options for diabetic retinopathy were discussed with patient.   - The need for close monitoring of blood glucose, blood pressure, and serum lipids, avoiding cigarette or any type of tobacco, and the need for long term follow up was also discussed with patient. - exam shows scattered MA/DBH OU - FA (05.25.25) shows Mild scattered vascular perfusion defects, scattered leaking MA, no NV OU - OCT shows OD: Severely myopic contour, blunted foveal contour, scattered focal cystic changes greatest IT macula-- slightly increased, OS: Focal cystic changes / edema temporal macula-- slightly improved - recommend IVA OD #1 today, 08.27.25 for +DME w/ a follow up in 4 weeks - pt wishes to proceed with injxn - RBA of procedure discussed, questions answered - IVA consent obtain, signed, and scanned OU 08.27.25 - see procedure note  - f/u in 4 weeks -- DFE/OCT, possible injection  4,5. Hypertensive retinopathy OU - discussed importance of tight BP control - monitor  6. Cortical Cataract OU - The symptoms of cataract, surgical  options, and treatments and risks were discussed with patient. - discussed diagnosis and progression - not yet visually significant - monitor for now  Ophthalmic Meds Ordered this visit:  Meds ordered this encounter  Medications   Bevacizumab  (AVASTIN ) SOLN 1.25 mg     Return in about 4 weeks (around 10/01/2023) for f/u NPDR OU , DFE, OCT, Possible, IVA, OD.  There are no Patient Instructions on file for this visit.  Explained the diagnoses, plan, and follow up with the patient and they expressed understanding.  Patient expressed understanding of the importance of proper follow up care.    This document serves as a record of services personally performed by Redell JUDITHANN Hans, MD, PhD. It was created on their behalf by Almetta Pesa, an ophthalmic technician. The creation of this record is the provider's dictation and/or activities during the visit.    Electronically signed by: Almetta Pesa, OA, 09/03/23  10:39 PM  This document serves as a record of services personally performed by Redell JUDITHANN Hans, MD, PhD. It was created on their behalf by Wanda GEANNIE Keens, COT an ophthalmic technician. The creation of this record is the provider's dictation and/or activities during the visit.    Electronically signed by:  Wanda GEANNIE Keens, COT  09/03/23 10:39 PM  Redell JUDITHANN Hans, M.D., Ph.D. Diseases & Surgery of the Retina and Vitreous [facility] 09/03/2023  I have reviewed the above documentation for accuracy and completeness, and I agree with the above. Redell JUDITHANN Hans, M.D., Ph.D. 09/03/23 10:42 PM   Abbreviations: M myopia (nearsighted); A astigmatism; H hyperopia (farsighted); P presbyopia; Mrx spectacle prescription;  CTL contact lenses; OD right eye; OS left eye; OU both eyes  XT exotropia; ET esotropia; PEK punctate epithelial keratitis; PEE punctate epithelial erosions; DES dry eye syndrome; MGD meibomian gland dysfunction; ATs artificial tears; PFAT's  preservative free artificial tears; NSC nuclear sclerotic cataract; PSC posterior subcapsular cataract; ERM epi-retinal membrane; PVD posterior vitreous detachment; RD retinal detachment; DM diabetes mellitus; DR diabetic retinopathy; NPDR non-proliferative diabetic retinopathy; PDR proliferative diabetic retinopathy; CSME clinically significant macular edema; DME diabetic macular edema; dbh dot blot hemorrhages; CWS cotton wool spot; POAG primary open angle glaucoma; C/D cup-to-disc ratio; HVF humphrey visual field; GVF goldmann visual field; OCT optical coherence tomography; IOP intraocular pressure; BRVO Branch retinal vein occlusion; CRVO central retinal vein occlusion; CRAO central retinal artery occlusion; BRAO branch retinal artery occlusion; RT retinal tear; SB scleral buckle; PPV pars plana vitrectomy; VH Vitreous hemorrhage; PRP panretinal laser photocoagulation; IVK intravitreal kenalog ; VMT vitreomacular traction; MH Macular hole;  NVD neovascularization of the disc; NVE neovascularization elsewhere; AREDS age related eye disease study; ARMD age related macular degeneration; POAG primary open angle glaucoma; EBMD epithelial/anterior basement membrane dystrophy; ACIOL anterior chamber intraocular lens; IOL intraocular lens; PCIOL posterior chamber intraocular lens; Phaco/IOL phacoemulsification with intraocular lens placement; PRK photorefractive keratectomy; LASIK laser assisted in situ keratomileusis; HTN hypertension; DM diabetes mellitus; COPD chronic obstructive pulmonary disease

## 2023-09-03 ENCOUNTER — Encounter (INDEPENDENT_AMBULATORY_CARE_PROVIDER_SITE_OTHER): Payer: Self-pay | Admitting: Ophthalmology

## 2023-09-03 ENCOUNTER — Ambulatory Visit (INDEPENDENT_AMBULATORY_CARE_PROVIDER_SITE_OTHER): Admitting: Ophthalmology

## 2023-09-03 DIAGNOSIS — Z794 Long term (current) use of insulin: Secondary | ICD-10-CM

## 2023-09-03 DIAGNOSIS — H269 Unspecified cataract: Secondary | ICD-10-CM

## 2023-09-03 DIAGNOSIS — H35033 Hypertensive retinopathy, bilateral: Secondary | ICD-10-CM

## 2023-09-03 DIAGNOSIS — I10 Essential (primary) hypertension: Secondary | ICD-10-CM

## 2023-09-03 DIAGNOSIS — E113313 Type 2 diabetes mellitus with moderate nonproliferative diabetic retinopathy with macular edema, bilateral: Secondary | ICD-10-CM

## 2023-09-03 DIAGNOSIS — Z7985 Long-term (current) use of injectable non-insulin antidiabetic drugs: Secondary | ICD-10-CM

## 2023-09-03 MED ORDER — BEVACIZUMAB CHEMO INJECTION 1.25MG/0.05ML SYRINGE FOR KALEIDOSCOPE
1.2500 mg | INTRAVITREAL | Status: AC | PRN
  Administered 2023-09-03: 1.25 mg via INTRAVITREAL

## 2023-09-28 ENCOUNTER — Other Ambulatory Visit: Payer: Self-pay | Admitting: Family Medicine

## 2023-09-28 DIAGNOSIS — E119 Type 2 diabetes mellitus without complications: Secondary | ICD-10-CM

## 2023-09-28 DIAGNOSIS — E1140 Type 2 diabetes mellitus with diabetic neuropathy, unspecified: Secondary | ICD-10-CM

## 2023-09-29 NOTE — Progress Notes (Signed)
 [facility] - Clinic Note  10/03/2023   CHIEF COMPLAINT Patient presents for Retina Follow Up  HISTORY OF PRESENT ILLNESS: Margaret Larsen is a 53 y.o. female who presents to the clinic today for:  HPI     Retina Follow Up   Patient presents with  Diabetic Retinopathy.  In both eyes.  This started 4 weeks ago.  I, the attending physician,  performed the HPI with the patient and updated documentation appropriately.        Comments   Patient here for 4 weeks retina follow up for NPDR OU. Patient states vision doing ok. No eye pain.       Last edited by Valdemar Rogue, MD on 10/03/2023  3:47 PM.     Pt states the vision is about the same.  Referring physician: Lonnie Earnest, MD [address] [place],  [place] [address]  HISTORICAL INFORMATION:  Selected notes from the MEDICAL RECORD NUMBER Referred by Dr. Nanetta Sharps for concern of NPDR OU LEE:  Ocular Hx- PMH-   CURRENT MEDICATIONS: No current outpatient medications on file. (Ophthalmic Drugs)   No current facility-administered medications for this visit. (Ophthalmic Drugs)   Current Outpatient Medications (Other)  Medication Sig   amLODipine  (NORVASC ) 2.5 MG tablet Take 1 tablet (2.5 mg total) by mouth at bedtime.   amLODipine  (NORVASC ) 5 MG tablet TAKE 1 TABLET(5 MG) BY MOUTH AT BEDTIME   aspirin  81 MG EC tablet Take 81 mg by mouth 3 (three) times a week. Reported on 05/19/2015   Continuous Glucose Sensor (FREESTYLE LIBRE 3 SENSOR) MISC PLACE 1 SENSOR ON THE SKIN EVERY 14 DAYS. USE TO CHECK GLUCOSE CONTINUOUSLY.   fluconazole  (DIFLUCAN ) 150 MG tablet TAKE 1 TABLET(150 MG) BY MOUTH 1 TIME FOR 1 DOSE   fluticasone  (FLONASE ) 50 MCG/ACT nasal spray Place 2 sprays into both nostrils daily.   Insulin  Aspart FlexPen (NOVOLOG ) 100 UNIT/ML INJECT 50 UNITS UNDER THE SKIN TWICE DAILY WITH A MEAL   insulin  degludec (TRESIBA  FLEXTOUCH) 200 UNIT/ML FlexTouch Pen ADMINISTER 70 UNITS UNDER THE SKIN DAILY    OZEMPIC , 2 MG/DOSE, 8 MG/3ML SOPN INJECT 2 MG UNDER THE SKIN ONCE A WEEK   rosuvastatin  (CRESTOR ) 5 MG tablet Take 1 tablet (5 mg total) by mouth daily.   No current facility-administered medications for this visit. (Other)   REVIEW OF SYSTEMS: ROS   Positive for: Endocrine, Eyes Negative for: Constitutional, Gastrointestinal, Neurological, Skin, Genitourinary, Musculoskeletal, HENT, Cardiovascular, Respiratory, Psychiatric, Allergic/Imm, Heme/Lymph Last edited by Orval Asberry RAMAN, COA on 10/03/2023  1:26 PM.       ALLERGIES Allergies  Allergen Reactions   Metformin  And Related Hives and Diarrhea    Unable to tolerate   Peanut-Containing Drug Products Shortness Of Breath and Swelling    mouth swelling   Insulin  Glargine Itching and Rash   Jardiance  [Empagliflozin ] Hives and Itching    Denies airway involvment   Rosuvastatin  Calcium  Nausea Only   Codeine Other (See Comments)    Patient stated that it slowed her heart rate, shortness of breath   Diphenhydramine Hcl Hives and Rash   Fexofenadine-Pseudoephed Er Other (See Comments)    Patient stated that is slowed her heart rate   Latuda [Lurasidone Hcl] Other (See Comments)    Myalgias all over, most notably in her arms and legs.   Shellfish Allergy Itching   PAST MEDICAL HISTORY Past Medical History:  Diagnosis Date   Cough 05/13/2016   Diabetes mellitus  Hemorrhoids 07/10/2010   Hypertension    Kidney stones    Kidney stones    Myalgia and myositis 06/19/2015   Renal disorder    Past Surgical History:  Procedure Laterality Date   CESAREAN SECTION     CHOLECYSTECTOMY     fiborids removed     TUBAL LIGATION     FAMILY HISTORY Family History  Problem Relation Age of Onset   Hypertension Mother    Diabetes Mother    Cancer Mother    SOCIAL HISTORY Social History   Tobacco Use   Smoking status: Never   Smokeless tobacco: Never  Vaping Use   Vaping status: Never Used  Substance Use Topics   Alcohol use: No    Drug use: No       OPHTHALMIC EXAM:  Base Eye Exam     Visual Acuity (Snellen - Linear)       Right Left   Dist cc 20/50 +1 20/40 -1   Dist ph cc NI NI    Correction: Contacts         Tonometry (Tonopen, 1:23 PM)       Right Left   Pressure 19 18         Pupils       Dark Light Shape React APD   Right 3 2 Round Brisk None   Left 3 2 Round Brisk None         Visual Fields (Counting fingers)       Left Right    Full Full         Extraocular Movement       Right Left    Full, Ortho Full, Ortho         Neuro/Psych     Oriented x3: Yes   Mood/Affect: Normal         Dilation     Both eyes: 1.0% Mydriacyl, 2.5% Phenylephrine @ 1:23 PM           Slit Lamp and Fundus Exam     Slit Lamp Exam       Right Left   Lids/Lashes Dermatochalasis - upper lid Dermatochalasis - upper lid   Conjunctiva/Sclera Melanosis mild melanosis   Cornea 1+ Punctate epithelial erosions, tear film debris 1+ Punctate epithelial erosions, tear film debris   Anterior Chamber deep and clear deep and clear   Iris Round and dilated, No NVI Round and dilated, No NVI   Lens 1-2+ Cortical cataract 1-2+ Cortical cataract   Anterior Vitreous mild syneresis mild syneresis         Fundus Exam       Right Left   Disc trace Pallor, +PPP, mild tilt mild Pallor, Sharp rim, mild PPP   C/D Ratio 0.65 0.5   Macula Flat, Blunted foveal reflex, scattered MA / DBH, cluster of DBH temporal macula- improved, + cystic changes temporal macula - improved Good foveal reflex, scattered MA / DBH, cluster of exudates and edema temporal to fovea-- slightly increased   Vessels attenuated, Tortuous, no NV attenuated, Tortuous, no NV   Periphery Attached, scattered MA / DBH greatest posteriorly, pigmented paving stone degeneration inferiorly Attached, rare MA / DBH           Refraction     Wearing Rx       Sphere   Right -10.00   Left -8.00           IMAGING AND PROCEDURES   Imaging and Procedures for 10/03/2023  OCT,  Retina - OU - Both Eyes       Right Eye Quality was good. Central Foveal Thickness: 335. Progression has improved. Findings include normal foveal contour, no SRF, myopic contour, intraretinal hyper-reflective material, intraretinal fluid (Severely myopic contour, blunted foveal contour, scattered focal cystic changes greatest IT macula-- slightly improved).   Left Eye Quality was good. Central Foveal Thickness: 283. Progression has worsened. Findings include normal foveal contour, no SRF, myopic contour, intraretinal hyper-reflective material, intraretinal fluid, vitreomacular adhesion (Focal cystic changes / edema temporal macula-- slightly increased).   Notes *Images captured and stored on drive  Diagnosis / Impression:  OD: Severely myopic contour, blunted foveal contour, scattered focal cystic changes greatest IT macula-- slightly improved OS: Focal cystic changes / edema temporal macula-- slightly increased  Clinical management:  See below  Abbreviations: NFP - Normal foveal profile. CME - cystoid macular edema. PED - pigment epithelial detachment. IRF - intraretinal fluid. SRF - subretinal fluid. EZ - ellipsoid zone. ERM - epiretinal membrane. ORA - outer retinal atrophy. ORT - outer retinal tubulation. SRHM - subretinal hyper-reflective material. IRHM - intraretinal hyper-reflective material      Intravitreal Injection, Pharmacologic Agent - OD - Right Eye       Time Out 10/03/2023. 1:38 PM. Confirmed correct patient, procedure, site, and patient consented.   Anesthesia Topical anesthesia was used. Anesthetic medications included Lidocaine  2%, Proparacaine 0.5%.   Procedure Preparation included 5% betadine to ocular surface, eyelid speculum. A (32g) needle was used.   Injection: 1.25 mg Bevacizumab  1.25mg /0.66ml   Route: Intravitreal, Site: Right Eye   NDC: H525437, Lot: [id number], Expiration date: 12/04/2023    Post-op Post injection exam found visual acuity of at least counting fingers. The patient tolerated the procedure well. There were no complications. The patient received written and verbal post procedure care education.      Intravitreal Injection, Pharmacologic Agent - OS - Left Eye       Time Out 10/03/2023. 1:50 PM. Confirmed correct patient, procedure, site, and patient consented.   Anesthesia Topical anesthesia was used. Anesthetic medications included Lidocaine  2%, Proparacaine 0.5%.   Procedure Preparation included 5% betadine to ocular surface, eyelid speculum. A (32g) needle was used.   Injection: 1.25 mg Bevacizumab  1.25mg /0.42ml   Route: Intravitreal, Site: Left Eye   NDC: H525437, Lot: [id number], Expiration date: 01/17/2024   Post-op Post injection exam found visual acuity of at least counting fingers. The patient tolerated the procedure well. There were no complications. The patient received written and verbal post procedure care education.           ASSESSMENT/PLAN:   ICD-10-CM   1. Moderate nonproliferative diabetic retinopathy of both eyes with macular edema associated with type 2 diabetes mellitus (HCC)  E11.3313 OCT, Retina - OU - Both Eyes    Intravitreal Injection, Pharmacologic Agent - OD - Right Eye    Intravitreal Injection, Pharmacologic Agent - OS - Left Eye    Bevacizumab  (AVASTIN ) SOLN 1.25 mg    Bevacizumab  (AVASTIN ) SOLN 1.25 mg    2. Current use of insulin  (HCC)  Z79.4     3. Long-term (current) use of injectable non-insulin  antidiabetic drugs  Z79.85     4. Essential hypertension  I10     5. Hypertensive retinopathy of both eyes  H35.033     6. Cortical cataract of both eyes  H26.9      1-3.  Moderate Non-proliferative diabetic retinopathy, both eyes - last A1c 8.7 (06.05.25), 9.6% (03.20.25) (max 11.7% on 04.01.24) -  exam shows scattered MA/DBH OU - FA (05.25.25) shows Mild scattered vascular perfusion defects, scattered leaking  MA, no NV OU - s/p IVA OD #1 (08.27.25) - OCT shows OD: Severely myopic contour, blunted foveal contour, scattered focal cystic changes greatest IT macula-- slightly improved, OS: Focal cystic changes / edema temporal macula-- slightly increased - recommend IVA OD #2 and IVA OS #1 today, 09.26.25 for +DME w/ follow up in 4 weeks - pt wishes to proceed with injxn - RBA of procedure discussed, questions answered - IVA consent obtain, signed, and scanned OU 08.27.25 - see procedure note  - f/u in 4 weeks -- DFE/OCT, possible injection(s)  4,5. Hypertensive retinopathy OU - discussed importance of tight BP control - monitor  6. Cortical Cataract OU - The symptoms of cataract, surgical options, and treatments and risks were discussed with patient. - discussed diagnosis and progression - not yet visually significant - monitor for now  Ophthalmic Meds Ordered this visit:  Meds ordered this encounter  Medications   Bevacizumab  (AVASTIN ) SOLN 1.25 mg   Bevacizumab  (AVASTIN ) SOLN 1.25 mg     Return in about 4 weeks (around 10/31/2023) for f/u, NPDR, DFE, OCT, Possible, IVA, OU.  There are no Patient Instructions on file for this visit.  This document serves as a record of services personally performed by Redell JUDITHANN Hans, MD, PhD. It was created on their behalf by Delon Newness COT, an ophthalmic technician. The creation of this record is the provider's dictation and/or activities during the visit.    Electronically signed by: Delon Newness COT 09.22.25 2:26 AM  This document serves as a record of services personally performed by Redell JUDITHANN Hans, MD, PhD. It was created on their behalf by Almetta Pesa, an ophthalmic technician. The creation of this record is the provider's dictation and/or activities during the visit.    Electronically signed by: Almetta Pesa, OA, 10/07/23  2:26 AM  This document serves as a record of services personally performed by Redell JUDITHANN Hans,  MD, PhD. It was created on their behalf by Wanda GEANNIE Keens, COT an ophthalmic technician. The creation of this record is the provider's dictation and/or activities during the visit.    Electronically signed by:  Wanda GEANNIE Keens, COT  10/07/23 2:26 AM  Redell JUDITHANN Hans, M.D., Ph.D. Diseases & Surgery of the Retina and Vitreous [facility] 10/03/2023   I have reviewed the above documentation for accuracy and completeness, and I agree with the above. Redell JUDITHANN Hans, M.D., Ph.D. 10/07/23 2:27 AM   Abbreviations: M myopia (nearsighted); A astigmatism; H hyperopia (farsighted); P presbyopia; Mrx spectacle prescription;  CTL contact lenses; OD right eye; OS left eye; OU both eyes  XT exotropia; ET esotropia; PEK punctate epithelial keratitis; PEE punctate epithelial erosions; DES dry eye syndrome; MGD meibomian gland dysfunction; ATs artificial tears; PFAT's preservative free artificial tears; NSC nuclear sclerotic cataract; PSC posterior subcapsular cataract; ERM epi-retinal membrane; PVD posterior vitreous detachment; RD retinal detachment; DM diabetes mellitus; DR diabetic retinopathy; NPDR non-proliferative diabetic retinopathy; PDR proliferative diabetic retinopathy; CSME clinically significant macular edema; DME diabetic macular edema; dbh dot blot hemorrhages; CWS cotton wool spot; POAG primary open angle glaucoma; C/D cup-to-disc ratio; HVF humphrey visual field; GVF goldmann visual field; OCT optical coherence tomography; IOP intraocular pressure; BRVO Branch retinal vein occlusion; CRVO central retinal vein occlusion; CRAO central retinal artery occlusion; BRAO branch retinal artery occlusion; RT retinal tear; SB scleral buckle; PPV pars plana vitrectomy; VH Vitreous hemorrhage;  PRP panretinal laser photocoagulation; IVK intravitreal kenalog ; VMT vitreomacular traction; MH Macular hole;  NVD neovascularization of the disc; NVE neovascularization elsewhere; AREDS age  related eye disease study; ARMD age related macular degeneration; POAG primary open angle glaucoma; EBMD epithelial/anterior basement membrane dystrophy; ACIOL anterior chamber intraocular lens; IOL intraocular lens; PCIOL posterior chamber intraocular lens; Phaco/IOL phacoemulsification with intraocular lens placement; PRK photorefractive keratectomy; LASIK laser assisted in situ keratomileusis; HTN hypertension; DM diabetes mellitus; COPD chronic obstructive pulmonary disease

## 2023-10-03 ENCOUNTER — Ambulatory Visit (INDEPENDENT_AMBULATORY_CARE_PROVIDER_SITE_OTHER): Admitting: Ophthalmology

## 2023-10-03 ENCOUNTER — Encounter (INDEPENDENT_AMBULATORY_CARE_PROVIDER_SITE_OTHER): Payer: Self-pay | Admitting: Ophthalmology

## 2023-10-03 DIAGNOSIS — H269 Unspecified cataract: Secondary | ICD-10-CM

## 2023-10-03 DIAGNOSIS — H35033 Hypertensive retinopathy, bilateral: Secondary | ICD-10-CM

## 2023-10-03 DIAGNOSIS — Z794 Long term (current) use of insulin: Secondary | ICD-10-CM | POA: Diagnosis not present

## 2023-10-03 DIAGNOSIS — Z7985 Long-term (current) use of injectable non-insulin antidiabetic drugs: Secondary | ICD-10-CM | POA: Diagnosis not present

## 2023-10-03 DIAGNOSIS — I10 Essential (primary) hypertension: Secondary | ICD-10-CM

## 2023-10-03 DIAGNOSIS — E113313 Type 2 diabetes mellitus with moderate nonproliferative diabetic retinopathy with macular edema, bilateral: Secondary | ICD-10-CM | POA: Diagnosis not present

## 2023-10-03 MED ORDER — BEVACIZUMAB CHEMO INJECTION 1.25MG/0.05ML SYRINGE FOR KALEIDOSCOPE
1.2500 mg | INTRAVITREAL | Status: AC | PRN
  Administered 2023-10-03: 1.25 mg via INTRAVITREAL

## 2023-10-14 ENCOUNTER — Ambulatory Visit: Admitting: Pharmacist

## 2023-10-14 ENCOUNTER — Encounter: Payer: Self-pay | Admitting: Pharmacist

## 2023-10-14 VITALS — BP 129/74 | HR 90 | Wt 213.0 lb

## 2023-10-14 DIAGNOSIS — I10 Essential (primary) hypertension: Secondary | ICD-10-CM

## 2023-10-14 DIAGNOSIS — E785 Hyperlipidemia, unspecified: Secondary | ICD-10-CM

## 2023-10-14 DIAGNOSIS — Z794 Long term (current) use of insulin: Secondary | ICD-10-CM

## 2023-10-14 DIAGNOSIS — E1140 Type 2 diabetes mellitus with diabetic neuropathy, unspecified: Secondary | ICD-10-CM

## 2023-10-14 DIAGNOSIS — E119 Type 2 diabetes mellitus without complications: Secondary | ICD-10-CM | POA: Diagnosis not present

## 2023-10-14 MED ORDER — OZEMPIC (2 MG/DOSE) 8 MG/3ML ~~LOC~~ SOPN: 2.0000 mg | PEN_INJECTOR | SUBCUTANEOUS | 2 refills | Status: AC

## 2023-10-14 MED ORDER — INSULIN ASPART FLEXPEN 100 UNIT/ML ~~LOC~~ SOPN
55.0000 [IU] | PEN_INJECTOR | Freq: Two times a day (BID) | SUBCUTANEOUS | 3 refills | Status: DC
Start: 2023-10-14 — End: 2023-11-11

## 2023-10-14 MED ORDER — TRESIBA FLEXTOUCH 200 UNIT/ML ~~LOC~~ SOPN: 100.0000 [IU] | PEN_INJECTOR | SUBCUTANEOUS | 5 refills | Status: DC

## 2023-10-14 MED ORDER — AMLODIPINE BESYLATE 2.5 MG PO TABS: 2.5000 mg | ORAL_TABLET | Freq: Every day | ORAL | 5 refills | Status: AC

## 2023-10-14 MED ORDER — ROSUVASTATIN CALCIUM 5 MG PO TABS: 5.0000 mg | ORAL_TABLET | Freq: Every day | ORAL | 5 refills | Status: AC

## 2023-10-14 NOTE — Patient Instructions (Signed)
 It was nice to see you today!  Your goal blood sugar is 80-130 before eating and less than 180 after eating.  Medication Changes: Increase insulin  degludec from 86 units to 100 units once daily  Continue all other medication the same.   Monitor blood sugars at home and keep a log (glucometer or piece of paper) to bring with you to your next visit.  Keep up the good work with diet and exercise. Aim for a diet full of vegetables, fruit and lean meats (chicken, [place], fish). Try to limit salt intake by eating fresh or frozen vegetables (instead of canned), rinse canned vegetables prior to cooking and do not add any additional salt to meals.

## 2023-10-14 NOTE — Progress Notes (Signed)
 S:     Chief Complaint  Patient presents with   Medication Management    Diabetes / Lipids    53 y.o. female who presents for diabetes evaluation, education, and management. Patient arrives in good spirits and presents without  any assistance.  PMH is significant for diabetes, hyperlipidemia, hypertension.   Current diabetes medications include: insulin  degludec 86 units once daily, insulin  aspart 45-55 units once daily with largest meal, and semaglutide  2mg  once weekly Current hypertension medications include: amlodipine  2.5mg  once daily Current hyperlipidemia medications include: rosuvastatin  5mg  once daily  Pt reports good adherence to diabetes meds, but poor adherence to other medications  Have you been experiencing any side effects to the medications prescribed? no  Patient reports hypoglycemic symptoms of sweating, takes a full bottle snapple.  Patient reports nocturia (nighttime urination), around twice nightly.  Patient reported dietary habits: Eats 1 meals/day in the evening, and snacks throughout the day   O:   Review of Systems  All other systems reviewed and are negative.   Physical Exam Vitals reviewed.  Constitutional:      Appearance: Normal appearance.  Pulmonary:     Effort: Pulmonary effort is normal.  Neurological:     Mental Status: She is alert.  Psychiatric:        Mood and Affect: Mood normal.        Behavior: Behavior normal.        Thought Content: Thought content normal.        Judgment: Judgment normal.     Libre3 CGM Download today  % Time CGM is active: 42% Average Glucose: 233 mg/dL Glucose Management Indicator: 8.9%  Glucose Variability: 29.8% (goal <36%) Time in Goal:  - Time in range 70-180: 24% - Time above range: 76% - Time below range: 0% Observed patterns: night-time spike around 12-2am  Lab Results  Component Value Date   HGBA1C 8.7 (A) 06/12/2023   Vitals:   10/14/23 1540  BP: 129/74  Pulse: 90  SpO2: 93%     Lipid Panel     Component Value Date/Time   CHOL 209 (H) 03/27/2023 1555   TRIG 100 03/27/2023 1555   HDL 54 03/27/2023 1555   CHOLHDL 3.9 03/27/2023 1555   CHOLHDL 4.6 05/19/2015 0933   VLDL 27 05/19/2015 0933   LDLCALC 137 (H) 03/27/2023 1555    Clinical Atherosclerotic Cardiovascular Disease (ASCVD): No  The 10-year ASCVD risk score (Arnett DK, et al., 2019) is: 12.4%   Values used to calculate the score:     Age: 14 years     Clincally relevant sex: Female     Is Non-Hispanic African American: Yes     Diabetic: Yes     Tobacco smoker: No     Systolic Blood Pressure: 129 mmHg     Is BP treated: Yes     HDL Cholesterol: 54 mg/dL     Total Cholesterol: 209 mg/dL     A/P: Diabetes longstanding currently uncontrolled. Patient is  able to verbalize appropriate hypoglycemia management plan. Medication adherence appears good. Control is suboptimal due to diet. -Increased dose of basal insulin  Tresiba  (insulin  degludec) from 86 units to 100 units daily in the morning. -Continued rapid insulin  Novolog  (insulin  aspart) 45-55 units daily with largest meal. -Continued GLP-1  Ozempic  (semaglutide ) 2 mg .  -Extensively discussed pathophysiology of diabetes, recommended lifestyle interventions, dietary effects on blood sugar control.  -Counseled on s/sx of and management of hypoglycemia.  -Next A1c anticipated at planned  upcoming PCP visit.    ASCVD risk - primary prevention in patient with diabetes. Last LDL is 137 not at goal of <29 mg/dL. ASCVD risk factors include T2DM, HTN, dyslipidemia and 10-year ASCVD risk score of 16%. High intensity statin indicated.  - Restarted and encouraged adherence with rosuvastatin  5 mg.    Hypertension longstanding currently controlled. Blood pressure goal of <130/80  mmHg. Medication adherence good.  -Continued amlodipine  2.5 mg.  Written patient instructions provided. Patient verbalized understanding of treatment plan.  Total time in face to  face counseling 32 minutes.    Follow-up:  PCP clinic visit in 10/31/23 Patient seen with Lawson Mao, PharmD Candidate - PY3 student and Belvie Macintosh, PharmD - P[employer]4 Candidate.

## 2023-10-14 NOTE — Assessment & Plan Note (Signed)
 Diabetes longstanding currently uncontrolled. Patient is  able to verbalize appropriate hypoglycemia management plan. Medication adherence appears good. Control is suboptimal due to diet. -Increased dose of basal insulin  Tresiba  (insulin  degludec) from 86 units to 100 units daily in the morning. -Continued rapid insulin  Novolog  (insulin  aspart) 45-55 units daily with largest meal. -Continued GLP-1  Ozempic  (semaglutide ) 2 mg .  -Extensively discussed pathophysiology of diabetes, recommended lifestyle interventions, dietary effects on blood sugar control.  -Counseled on s/sx of and management of hypoglycemia.  -Next A1c anticipated at planned upcoming PCP visit.

## 2023-10-14 NOTE — Assessment & Plan Note (Signed)
  ASCVD risk - primary prevention in patient with diabetes. Last LDL is 137 not at goal of <29 mg/dL. ASCVD risk factors include T2DM, HTN, dyslipidemia and 10-year ASCVD risk score of 16%. High intensity statin indicated.  - Restarted and encouraged adherence with rosuvastatin  5 mg.

## 2023-10-15 NOTE — Progress Notes (Signed)
 Reviewed and agree with Dr Rennis plan.

## 2023-10-29 NOTE — Progress Notes (Signed)
 [facility] - Clinic Note  10/31/2023   CHIEF COMPLAINT Patient presents for Retina Follow Up  HISTORY OF PRESENT ILLNESS: Margaret Larsen is a 53 y.o. female who presents to the clinic today for:  HPI     Retina Follow Up   Patient presents with  Diabetic Retinopathy.  In both eyes.  This started 5 months ago.  Severity is moderate.  Duration of 4 weeks.  I, the attending physician,  performed the HPI with the patient and updated documentation appropriately.        Comments   Pt denies any changes in vision/no FOL/floaters/pain. Pt states eyes have been feeling dry. Pt wore contacts today.  BS=libre fell off, not monitored. A1c=8.9 last checked with libre.      Last edited by Valdemar Rogue, MD on 10/31/2023  3:37 PM.    Pt states the vision is about the same.  Referring physician: Lonnie Earnest, MD [address] [place],  [place] [address]  HISTORICAL INFORMATION:  Selected notes from the MEDICAL RECORD NUMBER Referred by Dr. Nanetta Sharps for concern of NPDR OU LEE:  Ocular Hx- PMH-   CURRENT MEDICATIONS: No current outpatient medications on file. (Ophthalmic Drugs)   No current facility-administered medications for this visit. (Ophthalmic Drugs)   Current Outpatient Medications (Other)  Medication Sig   amLODipine  (NORVASC ) 2.5 MG tablet Take 1 tablet (2.5 mg total) by mouth at bedtime.   aspirin  81 MG EC tablet Take 81 mg by mouth 3 (three) times a week. Reported on 05/19/2015   Continuous Glucose Sensor (FREESTYLE LIBRE 3 SENSOR) MISC PLACE 1 SENSOR ON THE SKIN EVERY 14 DAYS. USE TO CHECK GLUCOSE CONTINUOUSLY.   fluticasone  (FLONASE ) 50 MCG/ACT nasal spray Place 2 sprays into both nostrils daily.   Insulin  Aspart FlexPen (NOVOLOG ) 100 UNIT/ML Inject 55 Units into the skin 2 (two) times daily with a meal.   insulin  degludec (TRESIBA  FLEXTOUCH) 200 UNIT/ML FlexTouch Pen Inject 100 Units into the skin daily. ADMINISTER 70 UNITS UNDER THE SKIN  DAILY   rosuvastatin  (CRESTOR ) 5 MG tablet Take 1 tablet (5 mg total) by mouth daily.   Semaglutide , 2 MG/DOSE, (OZEMPIC , 2 MG/DOSE,) 8 MG/3ML SOPN Inject 2 mg into the skin once a week.   No current facility-administered medications for this visit. (Other)   REVIEW OF SYSTEMS: ROS   Positive for: Endocrine, Eyes Negative for: Constitutional, Gastrointestinal, Neurological, Skin, Genitourinary, Musculoskeletal, HENT, Cardiovascular, Respiratory, Psychiatric, Allergic/Imm, Heme/Lymph Last edited by Elnor Avelina RAMAN, COT on 10/31/2023  1:40 PM.     ALLERGIES Allergies  Allergen Reactions   Metformin  And Related Hives and Diarrhea    Unable to tolerate   Peanut-Containing Drug Products Shortness Of Breath and Swelling    mouth swelling   Insulin  Glargine Itching and Rash   Jardiance  [Empagliflozin ] Hives and Itching    Denies airway involvment   Rosuvastatin  Calcium  Nausea Only   Codeine Other (See Comments)    Patient stated that it slowed her heart rate, shortness of breath   Diphenhydramine Hcl Hives and Rash   Fexofenadine-Pseudoephed Er Other (See Comments)    Patient stated that is slowed her heart rate   Latuda [Lurasidone Hcl] Other (See Comments)    Myalgias all over, most notably in her arms and legs.   Shellfish Allergy Itching   PAST MEDICAL HISTORY Past Medical History:  Diagnosis Date   Cough 05/13/2016   Diabetes mellitus    Hemorrhoids 07/10/2010   Hypertension  Kidney stones    Kidney stones    Myalgia and myositis 06/19/2015   Renal disorder    Past Surgical History:  Procedure Laterality Date   CESAREAN SECTION     CHOLECYSTECTOMY     fiborids removed     TUBAL LIGATION     FAMILY HISTORY Family History  Problem Relation Age of Onset   Hypertension Mother    Diabetes Mother    Cancer Mother    SOCIAL HISTORY Social History   Tobacco Use   Smoking status: Never   Smokeless tobacco: Never  Vaping Use   Vaping status: Never Used  Substance  Use Topics   Alcohol use: No   Drug use: No       OPHTHALMIC EXAM:  Base Eye Exam     Visual Acuity (Snellen - Linear)       Right Left   Dist cc 20/60 +2 40+1   Dist ph cc 20/50 -1 20/30 -2    Correction: Contacts         Tonometry (Tonopen, 1:47 PM)       Right Left   Pressure 21 21         Pupils       Pupils Dark Light Shape React APD   Right PERRL 3 2 Round Brisk None   Left PERRL 3 2 Round Brisk None         Visual Fields       Left Right    Full Full         Extraocular Movement       Right Left    Full Full         Neuro/Psych     Oriented x3: Yes   Mood/Affect: Normal         Dilation     Both eyes: 1.0% Mydriacyl, 2.5% Phenylephrine @ 1:47 PM           Slit Lamp and Fundus Exam     Slit Lamp Exam       Right Left   Lids/Lashes Dermatochalasis - upper lid Dermatochalasis - upper lid   Conjunctiva/Sclera Melanosis mild melanosis   Cornea 1+ Punctate epithelial erosions, tear film debris 1+ Punctate epithelial erosions, tear film debris   Anterior Chamber deep and clear deep and clear   Iris Round and dilated, No NVI Round and dilated, No NVI   Lens 1-2+ Cortical cataract 1-2+ Cortical cataract   Anterior Vitreous mild syneresis mild syneresis         Fundus Exam       Right Left   Disc trace Pallor, +PPP, mild tilt mild Pallor, Sharp rim, mild PPP   C/D Ratio 0.65 0.5   Macula Flat, Blunted foveal reflex, scattered MA / DBH, cluster of DBH temporal macula- improved, + cystic changes temporal macula - improved Good foveal reflex, scattered MA / DBH, cluster of exudates and edema temporal to fovea-- slightly improved   Vessels attenuated, Tortuous, no NV attenuated, Tortuous, no NV   Periphery Attached, scattered MA / DBH greatest posteriorly, pigmented paving stone degeneration inferiorly Attached, rare MA / DBH           IMAGING AND PROCEDURES  Imaging and Procedures for 10/31/2023  OCT, Retina - OU - Both  Eyes       Right Eye Quality was good. Central Foveal Thickness: 347. Progression has been stable. Findings include normal foveal contour, no SRF, myopic contour, intraretinal hyper-reflective material, intraretinal fluid (Severely  myopic contour, blunted foveal contour, scattered focal cystic changes greatest IT macula, elevated choroidal lesion IN mac w/ trace cystic changes overlying--stable).   Left Eye Quality was good. Central Foveal Thickness: 280. Progression has improved. Findings include normal foveal contour, no SRF, myopic contour, intraretinal hyper-reflective material, intraretinal fluid, vitreomacular adhesion (Focal cystic changes / edema temporal macula-- slightly improved).   Notes *Images captured and stored on drive  Diagnosis / Impression:  OD: Severely myopic contour, blunted foveal contour, scattered focal cystic changes greatest IT macula, elevated choroidal lesion IN mac w/ trace cystic changes overlying--stable OS: Focal cystic changes / edema temporal macula-- slightly improved  Clinical management:  See below  Abbreviations: NFP - Normal foveal profile. CME - cystoid macular edema. PED - pigment epithelial detachment. IRF - intraretinal fluid. SRF - subretinal fluid. EZ - ellipsoid zone. ERM - epiretinal membrane. ORA - outer retinal atrophy. ORT - outer retinal tubulation. SRHM - subretinal hyper-reflective material. IRHM - intraretinal hyper-reflective material      Intravitreal Injection, Pharmacologic Agent - OD - Right Eye       Time Out 10/31/2023. 2:22 PM. Confirmed correct patient, procedure, site, and patient consented.   Anesthesia Topical anesthesia was used. Anesthetic medications included Lidocaine  2%, Proparacaine 0.5%.   Procedure Preparation included 5% betadine to ocular surface, eyelid speculum. A supplied (32g) needle was used.   Injection: 1.25 mg Bevacizumab  1.25mg /0.21ml   Route: Intravitreal, Site: Right Eye   NDC:  [id number], Lot: [id number], Expiration date: 11/09/2023   Post-op Post injection exam found visual acuity of at least counting fingers. The patient tolerated the procedure well. There were no complications. The patient received written and verbal post procedure care education.      Intravitreal Injection, Pharmacologic Agent - OS - Left Eye       Time Out 10/31/2023. 2:22 PM. Confirmed correct patient, procedure, site, and patient consented.   Anesthesia Topical anesthesia was used. Anesthetic medications included Lidocaine  2%, Proparacaine 0.5%.   Procedure Preparation included 5% betadine to ocular surface, eyelid speculum. A (32g) needle was used.   Injection: 1.25 mg Bevacizumab  1.25mg /0.76ml   Route: Intravitreal, Site: Left Eye   NDC: C2662926, Lot: [id number], Expiration date: 01/17/2024   Post-op Post injection exam found visual acuity of at least counting fingers. The patient tolerated the procedure well. There were no complications. The patient received written and verbal post procedure care education.           ASSESSMENT/PLAN:   ICD-10-CM   1. Moderate nonproliferative diabetic retinopathy of both eyes with macular edema associated with type 2 diabetes mellitus (HCC)  E11.3313 OCT, Retina - OU - Both Eyes    Intravitreal Injection, Pharmacologic Agent - OD - Right Eye    Intravitreal Injection, Pharmacologic Agent - OS - Left Eye    Bevacizumab  (AVASTIN ) SOLN 1.25 mg    Bevacizumab  (AVASTIN ) SOLN 1.25 mg    2. Current use of insulin  (HCC)  Z79.4     3. Long-term (current) use of injectable non-insulin  antidiabetic drugs  Z79.85     4. Essential hypertension  I10     5. Cortical cataract of both eyes  H26.9       1-3.  Moderate Non-proliferative diabetic retinopathy, both eyes - last A1c 8.7 (06.05.25), 9.6% (03.20.25) (max 11.7% on 04.01.24) - exam shows scattered MA/DBH OU - FA (05.25.25) shows Mild scattered vascular perfusion defects, scattered leaking  MA, no NV OU - s/p IVA OD #1 (08.27.25) #2 (09.26.25) - s/p IVA  OS #1 (09.26.25) - OCT shows OD: Severely myopic contour, blunted foveal contour, scattered focal cystic changes greatest IT macula, elevated choroidal lesion IN mac w/ trace cystic changes overlying--stable, OS: Focal cystic changes / edema temporal macula-- slightly improved - recommend IVA OU (OD #3 and OS #2) today, 10.24.25 for +DME w/ follow up in 4 weeks - pt wishes to proceed with injxn - RBA of procedure discussed, questions answered - IVA consent obtain, signed, and scanned OU 08.27.25 - see procedure note  - f/u in 4 weeks -- DFE/OCT, possible injection(s)  4,5. Hypertensive retinopathy OU - discussed importance of tight BP control - monitor  6. Cortical Cataract OU - The symptoms of cataract, surgical options, and treatments and risks were discussed with patient. - discussed diagnosis and progression - monitor   Ophthalmic Meds Ordered this visit:  Meds ordered this encounter  Medications   Bevacizumab  (AVASTIN ) SOLN 1.25 mg   Bevacizumab  (AVASTIN ) SOLN 1.25 mg     Return in about 4 weeks (around 11/28/2023) for NPDR OU, DFE, OCT, Possible Injxn.  There are no Patient Instructions on file for this visit.  This document serves as a record of services personally performed by Redell JUDITHANN Hans, MD, PhD. It was created on their behalf by Delon Newness COT, an ophthalmic technician. The creation of this record is the provider's dictation and/or activities during the visit.    Electronically signed by: Delon Newness COT 10.22.25 8:14 PM  This document serves as a record of services personally performed by Redell JUDITHANN Hans, MD, PhD. It was created on their behalf by Almetta Pesa, an ophthalmic technician. The creation of this record is the provider's dictation and/or activities during the visit.    Electronically signed by: Almetta Pesa, OA, 11/06/23  8:14 PM  Redell JUDITHANN Hans, M.D.,  Ph.D. Diseases & Surgery of the Retina and Vitreous [facility] 10/31/2023   I have reviewed the above documentation for accuracy and completeness, and I agree with the above. Redell JUDITHANN Hans, M.D., Ph.D. 11/06/23 8:16 PM   Abbreviations: M myopia (nearsighted); A astigmatism; H hyperopia (farsighted); P presbyopia; Mrx spectacle prescription;  CTL contact lenses; OD right eye; OS left eye; OU both eyes  XT exotropia; ET esotropia; PEK punctate epithelial keratitis; PEE punctate epithelial erosions; DES dry eye syndrome; MGD meibomian gland dysfunction; ATs artificial tears; PFAT's preservative free artificial tears; NSC nuclear sclerotic cataract; PSC posterior subcapsular cataract; ERM epi-retinal membrane; PVD posterior vitreous detachment; RD retinal detachment; DM diabetes mellitus; DR diabetic retinopathy; NPDR non-proliferative diabetic retinopathy; PDR proliferative diabetic retinopathy; CSME clinically significant macular edema; DME diabetic macular edema; dbh dot blot hemorrhages; CWS cotton wool spot; POAG primary open angle glaucoma; C/D cup-to-disc ratio; HVF humphrey visual field; GVF goldmann visual field; OCT optical coherence tomography; IOP intraocular pressure; BRVO Branch retinal vein occlusion; CRVO central retinal vein occlusion; CRAO central retinal artery occlusion; BRAO branch retinal artery occlusion; RT retinal tear; SB scleral buckle; PPV pars plana vitrectomy; VH Vitreous hemorrhage; PRP panretinal laser photocoagulation; IVK intravitreal kenalog ; VMT vitreomacular traction; MH Macular hole;  NVD neovascularization of the disc; NVE neovascularization elsewhere; AREDS age related eye disease study; ARMD age related macular degeneration; POAG primary open angle glaucoma; EBMD epithelial/anterior basement membrane dystrophy; ACIOL anterior chamber intraocular lens; IOL intraocular lens; PCIOL posterior chamber intraocular lens; Phaco/IOL phacoemulsification  with intraocular lens placement; PRK photorefractive keratectomy; LASIK laser assisted in situ keratomileusis; HTN hypertension; DM diabetes mellitus; COPD chronic obstructive pulmonary disease

## 2023-10-31 ENCOUNTER — Encounter (INDEPENDENT_AMBULATORY_CARE_PROVIDER_SITE_OTHER): Payer: Self-pay | Admitting: Ophthalmology

## 2023-10-31 ENCOUNTER — Ambulatory Visit (INDEPENDENT_AMBULATORY_CARE_PROVIDER_SITE_OTHER): Admitting: Ophthalmology

## 2023-10-31 DIAGNOSIS — H269 Unspecified cataract: Secondary | ICD-10-CM

## 2023-10-31 DIAGNOSIS — Z7985 Long-term (current) use of injectable non-insulin antidiabetic drugs: Secondary | ICD-10-CM

## 2023-10-31 DIAGNOSIS — I10 Essential (primary) hypertension: Secondary | ICD-10-CM

## 2023-10-31 DIAGNOSIS — Z794 Long term (current) use of insulin: Secondary | ICD-10-CM | POA: Diagnosis not present

## 2023-10-31 DIAGNOSIS — E113313 Type 2 diabetes mellitus with moderate nonproliferative diabetic retinopathy with macular edema, bilateral: Secondary | ICD-10-CM | POA: Diagnosis not present

## 2023-10-31 MED ORDER — BEVACIZUMAB CHEMO INJECTION 1.25MG/0.05ML SYRINGE FOR KALEIDOSCOPE
1.2500 mg | INTRAVITREAL | Status: AC | PRN
  Administered 2023-10-31: 1.25 mg via INTRAVITREAL

## 2023-11-11 ENCOUNTER — Ambulatory Visit (INDEPENDENT_AMBULATORY_CARE_PROVIDER_SITE_OTHER): Admitting: Family Medicine

## 2023-11-11 ENCOUNTER — Encounter: Payer: Self-pay | Admitting: Family Medicine

## 2023-11-11 VITALS — BP 110/80 | HR 88 | Ht 59.0 in | Wt 211.0 lb

## 2023-11-11 DIAGNOSIS — E119 Type 2 diabetes mellitus without complications: Secondary | ICD-10-CM | POA: Diagnosis not present

## 2023-11-11 DIAGNOSIS — L209 Atopic dermatitis, unspecified: Secondary | ICD-10-CM | POA: Diagnosis not present

## 2023-11-11 DIAGNOSIS — E1140 Type 2 diabetes mellitus with diabetic neuropathy, unspecified: Secondary | ICD-10-CM | POA: Diagnosis present

## 2023-11-11 DIAGNOSIS — Z794 Long term (current) use of insulin: Secondary | ICD-10-CM | POA: Diagnosis not present

## 2023-11-11 LAB — POCT GLYCOSYLATED HEMOGLOBIN (HGB A1C): HbA1c, POC (controlled diabetic range): 9.3 % — AB (ref 0.0–7.0)

## 2023-11-11 MED ORDER — DESONIDE 0.05 % EX CREA: TOPICAL_CREAM | Freq: Two times a day (BID) | CUTANEOUS | 0 refills | Status: AC

## 2023-11-11 MED ORDER — TRIAMCINOLONE ACETONIDE 0.1 % EX OINT: 1.0000 | TOPICAL_OINTMENT | Freq: Two times a day (BID) | CUTANEOUS | 0 refills | Status: AC

## 2023-11-11 MED ORDER — INSULIN ASPART FLEXPEN 100 UNIT/ML ~~LOC~~ SOPN: 55.0000 [IU] | PEN_INJECTOR | Freq: Two times a day (BID) | SUBCUTANEOUS | 3 refills | Status: AC

## 2023-11-11 MED ORDER — TRESIBA FLEXTOUCH 200 UNIT/ML ~~LOC~~ SOPN: 110.0000 [IU] | PEN_INJECTOR | SUBCUTANEOUS | 5 refills | Status: AC

## 2023-11-11 NOTE — Patient Instructions (Addendum)
 Lets try to adjust your insulin  regimen. Lets try to do the novolog  20-25U with your first meal and 25-30U with your next meal.   We will increase the tresiba  to 110units until your follow up     Eczema Care Plan   Eczema (also known as atopic dermatitis) is a chronic condition; it typically improves and then flares (worsens) periodically. Some people have no symptoms for several years. Eczema is not curable, although symptoms can be controlled with proper skin care and medical treatment. Eczema can get better or worse depending on the time of year and sometimes without any trigger. The best treatment is prevention.   RECOMMENDATIONS:  Avoid aggravating factors (things that can make eczema worse).  Try to avoid using soaps, detergents or lotions with perfumes or other fragrances.  Other possible aggravating factors include heat, sweating, dry environments, synthetic fibers and tobacco smoke.  Avoid known eczema triggers, such as fragranced soaps/detergents. Use mild soaps and products that are free of perfumes, dyes, and alcohols, which can dry and irritate the skin. Look for products that are "fragrance-free," "hypoallergenic," and "for sensitive skin." New products containing "ceramide" actually replace some of the "glue" that is missing in the skin of eczema patients and are the most effective moisturizers.  Bathing: Take a bath once daily to keep the skin hydrated (moist).  Baths should not be longer than 10 to 15 minutes; the water should not be too warm. Fragrance free moisturizing bars or body washes are preferred such as Purpose, Cetaphil, Dove sensitive skin, Aveeno, or Vanicream products.          Moisturizing ointments/creams (emollients):  Apply emollients to entire body as often as possible, but at least once daily. The best emollients are thick creams (such as Eucerin, Cetaphil, and Cerave, Aveeno Eczema Therapy) or ointments (such as petroleum jelly, Aquaphor, and Vaseline) among  others. New products containing "ceramide" actually replace some of the "glue" that is missing in the skin of eczema patients and are the most effective moisturizers. Children with very dry skin often need to put on these creams two, three or four times a day.  As much as possible, use these creams enough to keep the skin from looking dry. If you are also using topical steroids, then emollients should be used after applying topical steroids.    Thick Creams                                  Ointments      Detergents: Consider using fragrance free/dye free detergent, such as Arm and Hammer for sensitive skin, Dreft, Tide Free or All Free.      Topical steroids: Topical steroids can be very effective for the treatment of eczema.  It is important to use topical steroids as directed by your healthcare provider to reduce the likelihood of any side effects. For affected areas on the face, neck or groin:  Apply desonide  0.05% cream  twice daily until the skin feels "smooth".  Then use once or twice daily as needed for flares. For affected areas on the trunk or extremities:  Apply  triamcinolone  0.1% ointment twice daily until the skin feels "smooth".  Then use once or twice daily as needed for flares.        Why can't I use steroid creams every day even if my child is not having an eczema flare?  - Regular use of steroid cream  will make the skin color lighter  - There is a small amount of steroid that may get into the bloodstream from the skin   Please let your healthcare provider know if there is no improvement after 14 days of treatment.

## 2023-11-11 NOTE — Assessment & Plan Note (Addendum)
 Will space out novolog  between two meals as patient feels she is having symptoms of lows and cbg reads as low as 50-60 after novolog . Increase tresiba  to 110u from 100 u in the morning. Will follow up with Dr. Koval

## 2023-11-11 NOTE — Progress Notes (Signed)
    SUBJECTIVE:   CHIEF COMPLAINT / HPI:   Patient presents for follow up on diabetes. Saw Dr amalia 10/7, who increased her tresiba  from 86 U to 100 U and continued novolog  45-55U with largest meal daily. Patient reports compliance with this regimen. Reports she feels like the novolog  will bottom her out. Her CBM will read 50-60 and she will get dizzy and sweaty. Normally eats one large meal daily, sometimes eats breakfast. Does not really have an appetite.    PERTINENT  PMH / PSH:  DMII HTN  OBJECTIVE:   BP 110/80   Pulse 88   Ht 4' 11 (1.499 m)   Wt 211 lb (95.7 kg)   SpO2 98%   BMI 42.62 kg/m   General: A&O, NAD HEENT: No sign of trauma, EOM grossly intact Cardiac: RRR, no m/r/g Respiratory: CTAB, normal WOB, no w/c/r Skin: eczematous patch on left forearm with some excoriation and hyperpigmentation, similar lesion behing left ear       ASSESSMENT/PLAN:   Assessment & Plan Type 2 diabetes mellitus with diabetic neuropathy, with long-term current use of insulin  (HCC) Will space out novolog  between two meals as patient feels she is having symptoms of lows and cbg reads as low as 50-60 after novolog . Increase tresiba  to 110u from 100 u in the morning. Will follow up with Dr. Koval Atopic dermatitis, unspecified type Rash consistent with atopic dermatitis. Will prescribe kenalog  and desonide  prn. Discussed use of emollients and proper protection from the sun     Gloriann Ogren, MD [facility]

## 2023-11-18 NOTE — Progress Notes (Shared)
 [facility] - Clinic Note  12/01/2023   CHIEF COMPLAINT Patient presents for No chief complaint on file.  HISTORY OF PRESENT ILLNESS: Margaret Larsen is a 53 y.o. female who presents to the clinic today for:   Pt states the vision is about the same.  Referring physician: Lonnie Earnest, MD [address] [place],  [place] [address]  HISTORICAL INFORMATION:  Selected notes from the MEDICAL RECORD NUMBER Referred by Dr. Nanetta Sharps for concern of NPDR OU LEE:  Ocular Hx- PMH-   CURRENT MEDICATIONS: No current outpatient medications on file. (Ophthalmic Drugs)   No current facility-administered medications for this visit. (Ophthalmic Drugs)   Current Outpatient Medications (Other)  Medication Sig   amLODipine  (NORVASC ) 2.5 MG tablet Take 1 tablet (2.5 mg total) by mouth at bedtime.   aspirin  81 MG EC tablet Take 81 mg by mouth 3 (three) times a week. Reported on 05/19/2015   Continuous Glucose Sensor (FREESTYLE LIBRE 3 SENSOR) MISC PLACE 1 SENSOR ON THE SKIN EVERY 14 DAYS. USE TO CHECK GLUCOSE CONTINUOUSLY.   desonide  (DESOWEN ) 0.05 % cream Apply topically 2 (two) times daily. Apply if you have a flare up, twice daily for up to 2 weeks   fluticasone  (FLONASE ) 50 MCG/ACT nasal spray Place 2 sprays into both nostrils daily.   Insulin  Aspart FlexPen (NOVOLOG ) 100 UNIT/ML Inject 55 Units into the skin 2 (two) times daily with a meal. Take 20-25 units with smaller meal and 25-30 units with larger meal   insulin  degludec (TRESIBA  FLEXTOUCH) 200 UNIT/ML FlexTouch Pen Inject 110 Units into the skin daily. ADMINISTER 70 UNITS UNDER THE SKIN DAILY   rosuvastatin  (CRESTOR ) 5 MG tablet Take 1 tablet (5 mg total) by mouth daily.   Semaglutide , 2 MG/DOSE, (OZEMPIC , 2 MG/DOSE,) 8 MG/3ML SOPN Inject 2 mg into the skin once a week.   triamcinolone  ointment (KENALOG ) 0.1 % Apply 1 Application topically 2 (two) times daily. Apply twice daily to effected area for up to 2 weeks  maximum   No current facility-administered medications for this visit. (Other)   REVIEW OF SYSTEMS:   ALLERGIES Allergies  Allergen Reactions   Metformin  And Related Hives and Diarrhea    Unable to tolerate   Peanut-Containing Drug Products Shortness Of Breath and Swelling    mouth swelling   Insulin  Glargine Itching and Rash   Jardiance  [Empagliflozin ] Hives and Itching    Denies airway involvment   Rosuvastatin  Calcium  Nausea Only   Codeine Other (See Comments)    Patient stated that it slowed her heart rate, shortness of breath   Diphenhydramine Hcl Hives and Rash   Fexofenadine-Pseudoephed Er Other (See Comments)    Patient stated that is slowed her heart rate   Latuda [Lurasidone Hcl] Other (See Comments)    Myalgias all over, most notably in her arms and legs.   Shellfish Allergy Itching   PAST MEDICAL HISTORY Past Medical History:  Diagnosis Date   Cough 05/13/2016   Diabetes mellitus    Hemorrhoids 07/10/2010   Hypertension    Kidney stones    Kidney stones    Myalgia and myositis 06/19/2015   Renal disorder    Past Surgical History:  Procedure Laterality Date   CESAREAN SECTION     CHOLECYSTECTOMY     fiborids removed     TUBAL LIGATION     FAMILY HISTORY Family History  Problem Relation Age of Onset   Hypertension Mother    Diabetes Mother  Cancer Mother    SOCIAL HISTORY Social History   Tobacco Use   Smoking status: Never   Smokeless tobacco: Never  Vaping Use   Vaping status: Never Used  Substance Use Topics   Alcohol use: No   Drug use: No       OPHTHALMIC EXAM:  Not recorded    IMAGING AND PROCEDURES  Imaging and Procedures for 12/01/2023         ASSESSMENT/PLAN:   ICD-10-CM   1. Moderate nonproliferative diabetic retinopathy of both eyes with macular edema associated with type 2 diabetes mellitus (HCC)  Z88.6686     2. Current use of insulin  (HCC)  Z79.4     3. Long-term (current) use of injectable non-insulin   antidiabetic drugs  Z79.85     4. Essential hypertension  I10     5. Cortical cataract of both eyes  H26.9     6. Hypertensive retinopathy of both eyes  H35.033      1-3.  Moderate Non-proliferative diabetic retinopathy, both eyes - last A1c 8.7 (06.05.25), 9.6% (03.20.25) (max 11.7% on 04.01.24) - exam shows scattered MA/DBH OU - FA (05.25.25) shows Mild scattered vascular perfusion defects, scattered leaking MA, no NV OU - s/p IVA OD #1 (08.27.25) #2 (09.26.25), #3 (10.24.25) - s/p IVA OS #1 (09.26.25), #2 (10.24.25) - OCT shows OD: Severely myopic contour, blunted foveal contour, scattered focal cystic changes greatest IT macula, elevated choroidal lesion IN mac w/ trace cystic changes overlying--stable, OS: Focal cystic changes / edema temporal macula-- slightly improved - recommend IVA OU (OD #4 and OS #3) today, 11.24.25 for +DME w/ follow up in 4 weeks - pt wishes to proceed with injxn - RBA of procedure discussed, questions answered - IVA consent obtain, signed, and scanned OU 08.27.25 - see procedure note  - f/u in 4 weeks -- DFE/OCT, possible injection(s)  4,5. Hypertensive retinopathy OU - discussed importance of tight BP control - monitor  6. Cortical Cataract OU - The symptoms of cataract, surgical options, and treatments and risks were discussed with patient. - discussed diagnosis and progression - monitor    Ophthalmic Meds Ordered this visit:  No orders of the defined types were placed in this encounter.    No follow-ups on file.  There are no Patient Instructions on file for this visit.  Explained the diagnoses, plan, and follow up with the patient and they expressed understanding.  Patient expressed understanding of the importance of proper follow up care.   This document serves as a record of services personally performed by Redell JUDITHANN Hans, MD, PhD. It was created on their behalf by Avelina Pereyra, COA an ophthalmic technician. The creation of this record  is the provider's dictation and/or activities during the visit.   Electronically signed by: Avelina GORMAN Pereyra, COT  12/01/23  7:25 AM   This document serves as a record of services personally performed by Redell JUDITHANN Hans, MD, PhD. It was created on their behalf by Wanda GEANNIE Keens, COT an ophthalmic technician. The creation of this record is the provider's dictation and/or activities during the visit.    Electronically signed by:  Wanda GEANNIE Keens, COT  12/01/23 7:26 AM  Redell JUDITHANN Hans, M.D., Ph.D. Diseases & Surgery of the Retina and Vitreous [facility] 12/01/2023  Abbreviations: M myopia (nearsighted); A astigmatism; H hyperopia (farsighted); P presbyopia; Mrx spectacle prescription;  CTL contact lenses; OD right eye; OS left eye; OU both eyes  XT exotropia; ET  esotropia; PEK punctate epithelial keratitis; PEE punctate epithelial erosions; DES dry eye syndrome; MGD meibomian gland dysfunction; ATs artificial tears; PFAT's preservative free artificial tears; NSC nuclear sclerotic cataract; PSC posterior subcapsular cataract; ERM epi-retinal membrane; PVD posterior vitreous detachment; RD retinal detachment; DM diabetes mellitus; DR diabetic retinopathy; NPDR non-proliferative diabetic retinopathy; PDR proliferative diabetic retinopathy; CSME clinically significant macular edema; DME diabetic macular edema; dbh dot blot hemorrhages; CWS cotton wool spot; POAG primary open angle glaucoma; C/D cup-to-disc ratio; HVF humphrey visual field; GVF goldmann visual field; OCT optical coherence tomography; IOP intraocular pressure; BRVO Branch retinal vein occlusion; CRVO central retinal vein occlusion; CRAO central retinal artery occlusion; BRAO branch retinal artery occlusion; RT retinal tear; SB scleral buckle; PPV pars plana vitrectomy; VH Vitreous hemorrhage; PRP panretinal laser photocoagulation; IVK intravitreal kenalog ; VMT vitreomacular traction; MH Macular hole;  NVD  neovascularization of the disc; NVE neovascularization elsewhere; AREDS age related eye disease study; ARMD age related macular degeneration; POAG primary open angle glaucoma; EBMD epithelial/anterior basement membrane dystrophy; ACIOL anterior chamber intraocular lens; IOL intraocular lens; PCIOL posterior chamber intraocular lens; Phaco/IOL phacoemulsification with intraocular lens placement; PRK photorefractive keratectomy; LASIK laser assisted in situ keratomileusis; HTN hypertension; DM diabetes mellitus; COPD chronic obstructive pulmonary disease

## 2023-12-01 ENCOUNTER — Encounter (INDEPENDENT_AMBULATORY_CARE_PROVIDER_SITE_OTHER): Admitting: Ophthalmology

## 2023-12-01 DIAGNOSIS — I10 Essential (primary) hypertension: Secondary | ICD-10-CM

## 2023-12-01 DIAGNOSIS — Z7985 Long-term (current) use of injectable non-insulin antidiabetic drugs: Secondary | ICD-10-CM

## 2023-12-01 DIAGNOSIS — H35033 Hypertensive retinopathy, bilateral: Secondary | ICD-10-CM

## 2023-12-01 DIAGNOSIS — Z794 Long term (current) use of insulin: Secondary | ICD-10-CM

## 2023-12-01 DIAGNOSIS — H269 Unspecified cataract: Secondary | ICD-10-CM

## 2023-12-01 DIAGNOSIS — E113313 Type 2 diabetes mellitus with moderate nonproliferative diabetic retinopathy with macular edema, bilateral: Secondary | ICD-10-CM

## 2023-12-02 ENCOUNTER — Encounter (INDEPENDENT_AMBULATORY_CARE_PROVIDER_SITE_OTHER): Admitting: Ophthalmology

## 2024-01-04 ENCOUNTER — Other Ambulatory Visit: Payer: Self-pay | Admitting: Family Medicine

## 2024-01-09 NOTE — Progress Notes (Shared)
 " [facility] - Clinic Note  01/14/2024   CHIEF COMPLAINT Patient presents for No chief complaint on file.  HISTORY OF PRESENT ILLNESS: Margaret Larsen is a 54 y.o. female who presents to the clinic today for:   Pt states the vision is about the same.  Referring physician: Lonnie Earnest, MD [address] [place],  [place] [address]  HISTORICAL INFORMATION:  Selected notes from the MEDICAL RECORD NUMBER Referred by Dr. Nanetta Sharps for concern of NPDR OU LEE:  Ocular Hx- PMH-   CURRENT MEDICATIONS: No current outpatient medications on file. (Ophthalmic Drugs)   No current facility-administered medications for this visit. (Ophthalmic Drugs)   Current Outpatient Medications (Other)  Medication Sig   amLODipine  (NORVASC ) 2.5 MG tablet Take 1 tablet (2.5 mg total) by mouth at bedtime.   aspirin  81 MG EC tablet Take 81 mg by mouth 3 (three) times a week. Reported on 05/19/2015   Continuous Glucose Sensor (FREESTYLE LIBRE 3 PLUS SENSOR) MISC CHANGE SENSOR EVERY 15 DAYS   desonide  (DESOWEN ) 0.05 % cream Apply topically 2 (two) times daily. Apply if you have a flare up, twice daily for up to 2 weeks   fluticasone  (FLONASE ) 50 MCG/ACT nasal spray Place 2 sprays into both nostrils daily.   Insulin  Aspart FlexPen (NOVOLOG ) 100 UNIT/ML Inject 55 Units into the skin 2 (two) times daily with a meal. Take 20-25 units with smaller meal and 25-30 units with larger meal   insulin  degludec (TRESIBA  FLEXTOUCH) 200 UNIT/ML FlexTouch Pen Inject 110 Units into the skin daily. ADMINISTER 70 UNITS UNDER THE SKIN DAILY   rosuvastatin  (CRESTOR ) 5 MG tablet Take 1 tablet (5 mg total) by mouth daily.   Semaglutide , 2 MG/DOSE, (OZEMPIC , 2 MG/DOSE,) 8 MG/3ML SOPN Inject 2 mg into the skin once a week.   triamcinolone  ointment (KENALOG ) 0.1 % Apply 1 Application topically 2 (two) times daily. Apply twice daily to effected area for up to 2 weeks maximum   No current facility-administered  medications for this visit. (Other)   REVIEW OF SYSTEMS:   ALLERGIES Allergies  Allergen Reactions   Metformin  And Related Hives and Diarrhea    Unable to tolerate   Peanut-Containing Drug Products Shortness Of Breath and Swelling    mouth swelling   Insulin  Glargine Itching and Rash   Jardiance  [Empagliflozin ] Hives and Itching    Denies airway involvment   Rosuvastatin  Calcium  Nausea Only   Codeine Other (See Comments)    Patient stated that it slowed her heart rate, shortness of breath   Diphenhydramine Hcl Hives and Rash   Fexofenadine-Pseudoephed Er Other (See Comments)    Patient stated that is slowed her heart rate   Latuda [Lurasidone Hcl] Other (See Comments)    Myalgias all over, most notably in her arms and legs.   Shellfish Allergy Itching   PAST MEDICAL HISTORY Past Medical History:  Diagnosis Date   Cough 05/13/2016   Diabetes mellitus    Hemorrhoids 07/10/2010   Hypertension    Kidney stones    Kidney stones    Myalgia and myositis 06/19/2015   Renal disorder    Past Surgical History:  Procedure Laterality Date   CESAREAN SECTION     CHOLECYSTECTOMY     fiborids removed     TUBAL LIGATION     FAMILY HISTORY Family History  Problem Relation Age of Onset   Hypertension Mother    Diabetes Mother    Cancer Mother  SOCIAL HISTORY Social History   Tobacco Use   Smoking status: Never   Smokeless tobacco: Never  Vaping Use   Vaping status: Never Used  Substance Use Topics   Alcohol use: No   Drug use: No       OPHTHALMIC EXAM:  Not recorded    IMAGING AND PROCEDURES  Imaging and Procedures for 01/14/2024         ASSESSMENT/PLAN: No diagnosis found.   1-3.  Moderate Non-proliferative diabetic retinopathy, both eyes - last A1c 8.7 (06.05.25), 9.6% (03.20.25) (max 11.7% on 04.01.24) - exam shows scattered MA/DBH OU - FA (05.25.25) shows Mild scattered vascular perfusion defects, scattered leaking MA, no NV OU - s/p IVA OD #1  (08.27.25) #2 (09.26.25), #3 (10.24.25) - s/p IVA OS #1 (09.26.25), #2 (10.24.25) - OCT shows OD: Severely myopic contour, blunted foveal contour, scattered focal cystic changes greatest IT macula, elevated choroidal lesion IN mac w/ trace cystic changes overlying--stable, OS: Focal cystic changes / edema temporal macula-- slightly improved - recommend IVA OU (OD #4 and OS #3) today, 01.07.26 for +DME w/ follow up in 4 weeks - pt wishes to proceed with injxn - RBA of procedure discussed, questions answered - IVA consent obtain, signed, and scanned OU 08.27.25 - see procedure note  - f/u in 4 weeks -- DFE/OCT, possible injection(s)  4,5. Hypertensive retinopathy OU - discussed importance of tight BP control - monitor  6. Cortical Cataract OU - The symptoms of cataract, surgical options, and treatments and risks were discussed with patient. - discussed diagnosis and progression - monitor   Ophthalmic Meds Ordered this visit:  No orders of the defined types were placed in this encounter.    No follow-ups on file.  There are no Patient Instructions on file for this visit.  This document serves as a record of services personally performed by Redell JUDITHANN Hans, MD, PhD. It was created on their behalf by Almetta Pesa, an ophthalmic technician. The creation of this record is the provider's dictation and/or activities during the visit.    Electronically signed by: Almetta Pesa, OA, 01/09/2024  9:33 AM  Redell JUDITHANN Hans, M.D., Ph.D. Diseases & Surgery of the Retina and Vitreous [facility] 10/31/2023    Abbreviations: M myopia (nearsighted); A astigmatism; H hyperopia (farsighted); P presbyopia; Mrx spectacle prescription;  CTL contact lenses; OD right eye; OS left eye; OU both eyes  XT exotropia; ET esotropia; PEK punctate epithelial keratitis; PEE punctate epithelial erosions; DES dry eye syndrome; MGD meibomian gland dysfunction; ATs artificial tears; PFAT's  preservative free artificial tears; NSC nuclear sclerotic cataract; PSC posterior subcapsular cataract; ERM epi-retinal membrane; PVD posterior vitreous detachment; RD retinal detachment; DM diabetes mellitus; DR diabetic retinopathy; NPDR non-proliferative diabetic retinopathy; PDR proliferative diabetic retinopathy; CSME clinically significant macular edema; DME diabetic macular edema; dbh dot blot hemorrhages; CWS cotton wool spot; POAG primary open angle glaucoma; C/D cup-to-disc ratio; HVF humphrey visual field; GVF goldmann visual field; OCT optical coherence tomography; IOP intraocular pressure; BRVO Branch retinal vein occlusion; CRVO central retinal vein occlusion; CRAO central retinal artery occlusion; BRAO branch retinal artery occlusion; RT retinal tear; SB scleral buckle; PPV pars plana vitrectomy; VH Vitreous hemorrhage; PRP panretinal laser photocoagulation; IVK intravitreal kenalog ; VMT vitreomacular traction; MH Macular hole;  NVD neovascularization of the disc; NVE neovascularization elsewhere; AREDS age related eye disease study; ARMD age related macular degeneration; POAG primary open angle glaucoma; EBMD epithelial/anterior basement membrane dystrophy; ACIOL anterior chamber intraocular lens; IOL intraocular  lens; PCIOL posterior chamber intraocular lens; Phaco/IOL phacoemulsification with intraocular lens placement; PRK photorefractive keratectomy; LASIK laser assisted in situ keratomileusis; HTN hypertension; DM diabetes mellitus; COPD chronic obstructive pulmonary disease  "

## 2024-01-11 ENCOUNTER — Emergency Department (HOSPITAL_BASED_OUTPATIENT_CLINIC_OR_DEPARTMENT_OTHER)
Admission: EM | Admit: 2024-01-11 | Discharge: 2024-01-11 | Disposition: A | Discharge status: Home / Self Care | Source: Home / Self Care | Attending: Emergency Medicine | Admitting: Emergency Medicine

## 2024-01-11 ENCOUNTER — Encounter (HOSPITAL_BASED_OUTPATIENT_CLINIC_OR_DEPARTMENT_OTHER): Payer: Self-pay | Admitting: Emergency Medicine

## 2024-01-11 ENCOUNTER — Emergency Department (HOSPITAL_BASED_OUTPATIENT_CLINIC_OR_DEPARTMENT_OTHER): Admitting: Radiology

## 2024-01-11 ENCOUNTER — Other Ambulatory Visit: Payer: Self-pay

## 2024-01-11 DIAGNOSIS — Z91010 Allergy to peanuts: Secondary | ICD-10-CM | POA: Diagnosis not present

## 2024-01-11 DIAGNOSIS — Z7982 Long term (current) use of aspirin: Secondary | ICD-10-CM | POA: Diagnosis not present

## 2024-01-11 DIAGNOSIS — R059 Cough, unspecified: Secondary | ICD-10-CM | POA: Insufficient documentation

## 2024-01-11 DIAGNOSIS — Z7984 Long term (current) use of oral hypoglycemic drugs: Secondary | ICD-10-CM | POA: Diagnosis not present

## 2024-01-11 DIAGNOSIS — E119 Type 2 diabetes mellitus without complications: Secondary | ICD-10-CM | POA: Diagnosis not present

## 2024-01-11 LAB — RESP PANEL BY RT-PCR (RSV, FLU A&B, COVID)  RVPGX2
Influenza A by PCR: NEGATIVE
Influenza B by PCR: NEGATIVE
Resp Syncytial Virus by PCR: NEGATIVE
SARS Coronavirus 2 by RT PCR: NEGATIVE

## 2024-01-11 MED ORDER — ALBUTEROL SULFATE HFA 108 (90 BASE) MCG/ACT IN AERS: 1.0000 | INHALATION_SPRAY | Freq: Four times a day (QID) | RESPIRATORY_TRACT | 0 refills | Status: AC | PRN

## 2024-01-11 MED ORDER — BENZONATATE 100 MG PO CAPS: 100.0000 mg | ORAL_CAPSULE | Freq: Three times a day (TID) | ORAL | 0 refills | Status: AC

## 2024-01-11 MED ORDER — IPRATROPIUM-ALBUTEROL 0.5-2.5 (3) MG/3ML IN SOLN
3.0000 mL | Freq: Once | RESPIRATORY_TRACT | Status: AC
  Administered 2024-01-11: 3 mL via RESPIRATORY_TRACT
  Filled 2024-01-11: qty 3

## 2024-01-11 MED ORDER — ACETAMINOPHEN 500 MG PO TABS
1000.0000 mg | ORAL_TABLET | Freq: Once | ORAL | Status: AC
  Administered 2024-01-11: 1000 mg via ORAL
  Filled 2024-01-11: qty 2

## 2024-01-11 NOTE — ED Triage Notes (Signed)
 Reports cough, shob, and body aches since Friday. Denies CP.

## 2024-01-11 NOTE — ED Provider Notes (Signed)
 " [place] EMERGENCY DEPARTMENT AT [facility] Provider Note   CSN: [id number] Arrival date & time: 01/11/24  1314     Patient presents with: Shortness of Breath   Margaret Larsen is a 54 y.o. female.   54 year old female with complaint of cough/shortness of breath/bodyaches.  Patient endorses symptoms onset with mild sore throat/cough beginning on Thursday, reports known sick contacts with sick coworkers last week.  Endorses ongoing cough with some mild yellow sputum production, episodes of shortness of breath particular with laying down or after an episode of coughing, and generalized bodyaches.  No history of COPD or asthma, reports she did have to use inhalers a long time ago.  Denies chest pain, abdominal pain/nausea/vomiting/diarrhea.   Shortness of Breath      Prior to Admission medications  Medication Sig Start Date End Date Taking? Authorizing Provider  amLODipine  (NORVASC ) 2.5 MG tablet Take 1 tablet (2.5 mg total) by mouth at bedtime. 10/14/23   McDiarmid, Krystal BIRCH, MD  aspirin  81 MG EC tablet Take 81 mg by mouth 3 (three) times a week. Reported on 05/19/2015    [provider]  Continuous Glucose Sensor (FREESTYLE LIBRE 3 PLUS SENSOR) MISC CHANGE SENSOR EVERY 15 DAYS 01/05/24   Baloch, Mahnoor, MD  desonide  (DESOWEN ) 0.05 % cream Apply topically 2 (two) times daily. Apply if you have a flare up, twice daily for up to 2 weeks 11/11/23   Baloch, Mahnoor, MD  fluticasone  (FLONASE ) 50 MCG/ACT nasal spray Place 2 sprays into both nostrils daily. 03/27/23   Baloch, Mahnoor, MD  Insulin  Aspart FlexPen (NOVOLOG ) 100 UNIT/ML Inject 55 Units into the skin 2 (two) times daily with a meal. Take 20-25 units with smaller meal and 25-30 units with larger meal 11/11/23   Baloch, Mahnoor, MD  insulin  degludec (TRESIBA  FLEXTOUCH) 200 UNIT/ML FlexTouch Pen Inject 110 Units into the skin daily. ADMINISTER 70 UNITS UNDER THE SKIN DAILY 11/11/23   Baloch, Mahnoor, MD  rosuvastatin   (CRESTOR ) 5 MG tablet Take 1 tablet (5 mg total) by mouth daily. 10/14/23   McDiarmid, Krystal BIRCH, MD  Semaglutide , 2 MG/DOSE, (OZEMPIC , 2 MG/DOSE,) 8 MG/3ML SOPN Inject 2 mg into the skin once a week. 10/14/23   McDiarmid, Krystal BIRCH, MD  triamcinolone  ointment (KENALOG ) 0.1 % Apply 1 Application topically 2 (two) times daily. Apply twice daily to effected area for up to 2 weeks maximum 11/11/23   Baloch, Mahnoor, MD    Allergies: Metformin  and related, Peanut-containing drug products, Insulin  glargine, Jardiance  [empagliflozin ], Rosuvastatin  calcium , Codeine, Diphenhydramine hcl, Fexofenadine-pseudoephed er, Latuda [lurasidone hcl], and Shellfish allergy    Review of Systems  Respiratory:  Positive for shortness of breath.     Updated Vital Signs  Vitals:   01/11/24 1322 01/11/24 1323 01/11/24 1451 01/11/24 1512  BP:  (!) 149/104 (!) 147/112   Pulse:  (!) 106 94   Resp:  17 19   Temp: 98.6 F (37 C) 98.6 F (37 C)    TempSrc: Oral Oral    SpO2:  91% 95% 94%     Physical Exam Vitals and nursing note reviewed.  HENT:     Head: Normocephalic.     Mouth/Throat:     Mouth: Mucous membranes are moist.     Pharynx: No oropharyngeal exudate or posterior oropharyngeal erythema.  Eyes:     Extraocular Movements: Extraocular movements intact.     Pupils: Pupils are equal, round, and reactive to light.  Cardiovascular:     Rate and Rhythm:  Normal rate and regular rhythm.     Pulses:          Radial pulses are 2+ on the right side and 2+ on the left side.       Dorsalis pedis pulses are 2+ on the right side and 2+ on the left side.     Heart sounds: Normal heart sounds.  Pulmonary:     Effort: Pulmonary effort is normal.     Breath sounds: Wheezing (Trace diffuse expiratory wheeze throughout) present.  Abdominal:     Palpations: Abdomen is soft.     Tenderness: There is no abdominal tenderness. There is no guarding.  Musculoskeletal:     Cervical back: Normal range of motion.     Right  lower leg: No edema.     Left lower leg: No edema.     Comments: Moves all extremities spontaneously without difficulty  Skin:    General: Skin is warm and dry.  Neurological:     Mental Status: She is alert and oriented to person, place, and time.     (all labs ordered are listed, but only abnormal results are displayed) Labs Reviewed  RESP PANEL BY RT-PCR (RSV, FLU A&B, COVID)  [id number]    EKG: None  Radiology: DG Chest 2 View Result Date: 01/11/2024 CLINICAL DATA:  Cough, shortness of breath, and body aches. EXAM: CHEST - 2 VIEW COMPARISON:  11/14/2022. FINDINGS: The heart size and mediastinal contours are within normal limits. No consolidation, effusion, or pneumothorax is seen. Degenerative changes are present in the thoracic spine. No acute osseous abnormality. Surgical clips are identified in the right upper quadrant. IMPRESSION: No active cardiopulmonary disease. Electronically Signed   By: Leita Birmingham M.D.   On: 01/11/2024 13:59     Procedures   Medications Ordered in the ED - No data to display                                  Medical Decision Making This patient presents to the ED for concern of cough/shortness of breath/body aches, this involves an extensive number of treatment options, and is a complaint that carries with it a high risk of complications and morbidity.  The differential diagnosis includes COVID/flu/RSV, other viral respiratory illness, pneumonia, asthma exacerbation   Co morbidities that complicate the patient evaluation  Diabetes, hyperlipidemia   Additional history obtained:  Additional history obtained from record review External records from outside source obtained and reviewed including prior PCP note   Lab Tests:  I Ordered, and personally interpreted labs.  The pertinent results include: Respiratory panel negative   Imaging Studies ordered:  I ordered imaging studies including CXR  I independently visualized and interpreted  imaging which showed No active cardiopulmonary disease.  I agree with the radiologist interpretation   Cardiac Monitoring: / EKG:  The patient was maintained on a cardiac monitor.  I personally viewed and interpreted the cardiac monitored which showed an underlying rhythm of: NSR   Problem List / ED Course / Critical interventions / Medication management  I ordered medication including Tylenol  for body aches, DuoNeb for wheezing/shortness of breath Reevaluation of the patient after these medicines showed that the patient improved I have reviewed the patients home medicines and have made adjustments as needed   Social Determinants of Health:  Depression   Test / Admission - Considered:  Physical exam is largely unremarkable as above, patient does have mild  diffuse expiratory wheeze throughout, will reassess after administration of DuoNeb.  Chest x-ray without consolidation/infiltrate raising suspicion for pneumonia.  COVID/flu/RSV negative. Vitals are reassuring, patient is mildly hypertensive and was mildly tachycardic at one point but this has since resolved, maintaining her oxygen saturation on room air, no evidence of respiratory distress.  Was documented as borderline hypoxia at 91%, however I believe that this was an incorrect measurement/documentation, as throughout my assessment she has maintained her oxygen saturation in the mid 90s-100%. Patient notes symptomatic improvement after administration of DuoNeb, upon reassessment wheezing has resolved entirely.  [name] symptoms today are likely reflective of an ongoing viral illness, notes known sick contacts at work and at home.  I recommend that she continue Tylenol  as needed for body aches/fever, will prescribe Tessalon  for cough and albuterol  for wheezing/shortness of breath.  Patient voiced understanding and is in agreement this plan, return precautions discussed, she is appropriate for discharge at this time.     Amount  and/or Complexity of Data Reviewed Radiology: ordered.  Risk OTC drugs. Prescription drug management.        Final diagnoses:  Cough, unspecified type    ED Discharge Orders          Ordered    albuterol  (VENTOLIN  HFA) 108 (90 Base) MCG/ACT inhaler  Every 6 hours PRN        01/11/24 1543    benzonatate  (TESSALON ) 100 MG capsule  Every 8 hours        01/11/24 1543               Glendia Rocky SAILOR, PA-C 01/11/24 1548  "

## 2024-01-11 NOTE — Discharge Instructions (Signed)
 Your symptoms today are likely due to a viral illness, although you did test negative for COVID/flu/RSV.  Continue Tylenol  as needed for fever or bodyaches.  Start Tessalon , use every 8 hours as needed for cough.  Start albuterol , inhaler 1 to 2 puffs every 6 hours as needed for wheezing or shortness of breath.  Follow-up with your primary care provider if your symptoms persist, return to the emergency department if your symptoms worsen.

## 2024-01-12 ENCOUNTER — Ambulatory Visit (INDEPENDENT_AMBULATORY_CARE_PROVIDER_SITE_OTHER): Admitting: Family Medicine

## 2024-01-12 VITALS — BP 139/86 | HR 85 | Ht 59.0 in | Wt 207.0 lb

## 2024-01-12 DIAGNOSIS — J019 Acute sinusitis, unspecified: Secondary | ICD-10-CM

## 2024-01-12 MED ORDER — AMOXICILLIN 875 MG PO TABS: 875.0000 mg | ORAL_TABLET | Freq: Two times a day (BID) | ORAL | 0 refills | Status: AC

## 2024-01-12 NOTE — Progress Notes (Signed)
" ° ° °  SUBJECTIVE:   CHIEF COMPLAINT / HPI:   Follow up from ED visit 01/11/24 --Cough, SOB, bodyaches --Normal CXR, neg viral resp panel --Improved with duoneb --sent home with tylenol , tessalon , albuterol  inhaler --Today: reports ongoing cough, wheeze, trouble sleeping, SOB. Feels symptoms are the same as yesterday. States inhaler/tessalon  does not work. Also tried dayquil and alkaseltzer, this helps a bit --no fevers --states sx started about 2 weeks ago and were okay, states symptoms got worse starting last Thursday   PERTINENT  PMH / PSH: HTN, OSA, T2DM, BPD  OBJECTIVE:   BP 139/86   Pulse 85   Ht 4' 11 (1.499 m)   Wt 207 lb (93.9 kg)   SpO2 96%   BMI 41.81 kg/m   General: Awake and conversant, somewhat tired appearing but no acute distress HEENT: voice somewhat hoarse, MMM, no pharyngeal erythema or exudates CV: RRR, normal S1/S2, no M/R/G Pulm: CTAB, normal work of breathing on room air, no W/R/R. Neuro: No focal deficits Psych: Appropriate mood and affect   ASSESSMENT/PLAN:   Assessment & Plan Acute non-recurrent sinusitis, unspecified location Follow up URI sx, negative viral resp panel in ER yesterday. Given duration of symptoms with worsening in the last several days, will treat with antibiotics. Advised good PO hydration, steam to help break up mucus, tylenol /dayquil/nyquil PRN. Reviewed return precautions. - amoxicillin  (AMOXIL ) 875 MG tablet; Take 1 tablet (875 mg total) by mouth 2 (two) times daily.  Dispense: 14 tablet; Refill: 0      Rea Raring, MD [facility] "

## 2024-01-12 NOTE — Patient Instructions (Signed)
 Thank you for coming in today! Here is a summary of what we discussed:  -I sent in 7 days worth of antibiotics we can try for your symptoms. Please let us  know if things get worse or do not improve with the medication  -I recommend warm fluids, honey, and steam to help clear up your congestion  Please call the clinic at [phone] if your symptoms worsen or you have any concerns.  Best, Dr Adele

## 2024-01-14 ENCOUNTER — Encounter (INDEPENDENT_AMBULATORY_CARE_PROVIDER_SITE_OTHER): Admitting: Ophthalmology

## 2024-01-14 DIAGNOSIS — I10 Essential (primary) hypertension: Secondary | ICD-10-CM

## 2024-01-14 DIAGNOSIS — H269 Unspecified cataract: Secondary | ICD-10-CM

## 2024-01-14 DIAGNOSIS — Z794 Long term (current) use of insulin: Secondary | ICD-10-CM

## 2024-01-14 DIAGNOSIS — E113313 Type 2 diabetes mellitus with moderate nonproliferative diabetic retinopathy with macular edema, bilateral: Secondary | ICD-10-CM

## 2024-01-14 DIAGNOSIS — Z7985 Long-term (current) use of injectable non-insulin antidiabetic drugs: Secondary | ICD-10-CM

## 2024-01-14 DIAGNOSIS — H35033 Hypertensive retinopathy, bilateral: Secondary | ICD-10-CM

## 2024-01-29 ENCOUNTER — Ambulatory Visit: Payer: Self-pay | Admitting: Family Medicine

## 2024-01-29 ENCOUNTER — Encounter: Payer: Self-pay | Admitting: Family Medicine

## 2024-01-29 VITALS — BP 133/92 | HR 83 | Ht 59.0 in | Wt 213.8 lb

## 2024-01-29 DIAGNOSIS — R053 Chronic cough: Secondary | ICD-10-CM | POA: Diagnosis not present

## 2024-01-29 DIAGNOSIS — M25511 Pain in right shoulder: Secondary | ICD-10-CM

## 2024-01-29 DIAGNOSIS — G8929 Other chronic pain: Secondary | ICD-10-CM

## 2024-01-29 DIAGNOSIS — R1310 Dysphagia, unspecified: Secondary | ICD-10-CM | POA: Diagnosis present

## 2024-01-29 DIAGNOSIS — M25512 Pain in left shoulder: Secondary | ICD-10-CM

## 2024-01-29 MED ORDER — PANTOPRAZOLE SODIUM 40 MG PO TBEC: 40.0000 mg | DELAYED_RELEASE_TABLET | Freq: Every day | ORAL | 3 refills | Status: AC

## 2024-01-29 NOTE — Patient Instructions (Signed)
 It was wonderful to see you today.  Please bring ALL of your medications with you to every visit.   VISIT SUMMARY: During your visit, we discussed your persistent cough and associated symptoms, as well as your recent arm pain and numbness. We reviewed your current medications and symptoms, and made adjustments to your treatment plan.  YOUR PLAN: -DYSPHAGIA WITH CHRONIC COUGH: Dysphagia means difficulty swallowing, which can sometimes cause a chronic cough. Your cough is triggered by eating or drinking, and you have mucus production and gagging. We have prescribed Protonix  as an alternative to Maalox to help with any acid reflux that might be contributing to your symptoms. You have been referred to speech therapy for assessment of your swallowing and esophageal function.  -BILATERAL SHOULDER OSTEOARTHRITIS: Osteoarthritis is a condition where the protective cartilage that cushions the ends of your bones wears down over time. You have been experiencing bilateral shoulder pain and numbness, which suggests osteoarthritis. We have ordered x-rays of both shoulders to get a better understanding of the condition. We will follow up in two weeks to review the results and discuss the next steps.  INSTRUCTIONS: Please follow up in two weeks for a review of your shoulder x-rays and to discuss the next steps for your chronic cough and swallowing issues. In the meantime, take Protonix  as prescribed and attend your appointments with gastroenterology and speech therapy.  Contains text generated by [employer].   Thank you for choosing [facility].   Please call [phone] with any questions about today's appointment.  Please be sure to schedule follow up at the front desk before you leave today.   Areta Saliva, MD  Family Medicine

## 2024-01-29 NOTE — Progress Notes (Signed)
" ° °  SUBJECTIVE:   CHIEF COMPLAINT / HPI:  Discussed the use of AI scribe software for clinical note transcription with the patient, who gave verbal consent to proceed.  History of Present Illness Margaret Larsen is a 54 year old female who presents with a persistent cough and associated symptoms.  Cough and respiratory symptoms - Persistent, uncontrollable cough for three weeks - Cough triggered by eating, drinking, talking, and lying down - Cloudy mucus sensation stuck in chest - No fever or rhinorrhea - No current shortness of breath - Previously prescribed inhaler, inhaler use causes sensation of chest caving in and worsens cough - Started antibiotics at the beginning of the month for sinusitis - Discontinued antibiotics after four days due to stomach upset - No improvement in symptoms with antibiotics  Gastrointestinal symptoms - Tried Maalox for suspected acid reflux without relief  Musculoskeletal and neurological symptoms - Worsening shoulder pain holding objects, onset about one month ago -Shoulder pain radiates down both arms -Denies numbness except for when she sleeps on her left arm for too long.     PERTINENT  PMH / PSH: Type 2 diabetes  OBJECTIVE:  BP (!) 133/92   Pulse 83   Ht 4' 11 (1.499 m)   Wt 213 lb 12.8 oz (97 kg)   SpO2 100%   BMI 43.18 kg/m   General: well appearing, in no acute distress CV: RRR, radial pulses equal and palpable, no BLE edema  Resp: Normal work of breathing on room air, CTAB Abd: Soft, non tender, nontender at epigastric region, non distended, does have small umbilical hernia nontender MSK: Bilateral shoulders without any gross visual abnormality, full range of motion passively, full range of motion actively, no tenderness to palpation, positive Jobe's test bilaterally, positive liftoff test on the left, negative drop arm test, decreased strength with external rotation bilaterally 4 out of 5 otherwise full strength  bilaterally  ASSESSMENT/PLAN:   Assessment & Plan Swallowing dysfunction Chronic cough Chronic cough triggered by eating or drinking, with mucus production and gagging. Differential includes esophageal dysmotility, GERD or acute bronchitis. No pneumonia on chest x-ray.  - Prescribed Protonix  daily - Referred to therapy for further evaluation of swallowing function. - Scheduled follow-up in two weeks. Chronic pain of both shoulders Bilateral shoulder pain worsening over the past month suggestive of osteoarthritis, less likely rotator cuff injury without any trauma or inciting event.  No prior imaging. - Ordered x-rays of both shoulders - Follow-up in 2 weeks     Areta Saliva, MD [facility] "

## 2024-01-30 DIAGNOSIS — R1310 Dysphagia, unspecified: Secondary | ICD-10-CM | POA: Insufficient documentation

## 2024-01-30 DIAGNOSIS — G8929 Other chronic pain: Secondary | ICD-10-CM | POA: Insufficient documentation

## 2024-01-30 NOTE — Assessment & Plan Note (Signed)
 Chronic cough triggered by eating or drinking, with mucus production and gagging. Differential includes esophageal dysmotility, GERD or acute bronchitis. No pneumonia on chest x-ray.  - Prescribed Protonix  daily - Referred to therapy for further evaluation of swallowing function. - Scheduled follow-up in two weeks.

## 2024-01-30 NOTE — Assessment & Plan Note (Signed)
 Bilateral shoulder pain worsening over the past month suggestive of osteoarthritis, less likely rotator cuff injury without any trauma or inciting event.  No prior imaging. - Ordered x-rays of both shoulders - Follow-up in 2 weeks

## 2024-02-23 ENCOUNTER — Ambulatory Visit: Payer: Self-pay | Admitting: Family Medicine
